# Patient Record
Sex: Female | Born: 1955 | Race: White | Hispanic: No | Smoking: Former smoker
Health system: Southern US, Community
[De-identification: ages and names within clinical notes are randomized; demographics above are authoritative.]

## PROBLEM LIST (undated history)

## (undated) DIAGNOSIS — Z9889 Other specified postprocedural states: Secondary | ICD-10-CM

## (undated) DIAGNOSIS — F419 Anxiety disorder, unspecified: Secondary | ICD-10-CM

## (undated) DIAGNOSIS — I10 Essential (primary) hypertension: Secondary | ICD-10-CM

## (undated) DIAGNOSIS — IMO0001 Reserved for inherently not codable concepts without codable children: Secondary | ICD-10-CM

## (undated) DIAGNOSIS — R112 Nausea with vomiting, unspecified: Secondary | ICD-10-CM

## (undated) DIAGNOSIS — K219 Gastro-esophageal reflux disease without esophagitis: Secondary | ICD-10-CM

## (undated) DIAGNOSIS — I4891 Unspecified atrial fibrillation: Secondary | ICD-10-CM

## (undated) DIAGNOSIS — I499 Cardiac arrhythmia, unspecified: Secondary | ICD-10-CM

## (undated) DIAGNOSIS — E119 Type 2 diabetes mellitus without complications: Secondary | ICD-10-CM

## (undated) HISTORY — PX: BACK SURGERY: SHX140

## (undated) HISTORY — PX: FOOT SURGERY: SHX648

## (undated) HISTORY — PX: KNEE SURGERY: SHX244

## (undated) HISTORY — PX: ABDOMINAL HYSTERECTOMY: SHX81

## (undated) HISTORY — PX: KNEE ARTHROSCOPY: SUR90

## (undated) HISTORY — PX: TUBAL LIGATION: SHX77

## (undated) HISTORY — PX: CHOLECYSTECTOMY: SHX55

---

## 2000-08-24 ENCOUNTER — Encounter: Admission: RE | Admit: 2000-08-24 | Discharge: 2000-08-24 | Payer: Self-pay | Admitting: Neurological Surgery

## 2000-08-24 ENCOUNTER — Encounter: Payer: Self-pay | Admitting: Neurological Surgery

## 2001-05-05 ENCOUNTER — Other Ambulatory Visit: Admission: RE | Admit: 2001-05-05 | Discharge: 2001-05-05 | Payer: Self-pay | Admitting: Obstetrics and Gynecology

## 2002-11-07 ENCOUNTER — Encounter: Payer: Self-pay | Admitting: Neurological Surgery

## 2002-11-07 ENCOUNTER — Ambulatory Visit (HOSPITAL_COMMUNITY): Admission: RE | Admit: 2002-11-07 | Discharge: 2002-11-07 | Payer: Self-pay | Admitting: Hematology and Oncology

## 2003-09-22 ENCOUNTER — Inpatient Hospital Stay (HOSPITAL_COMMUNITY): Admission: EM | Admit: 2003-09-22 | Discharge: 2003-09-23 | Payer: Self-pay | Admitting: Obstetrics and Gynecology

## 2004-12-09 ENCOUNTER — Ambulatory Visit (HOSPITAL_COMMUNITY): Admission: RE | Admit: 2004-12-09 | Discharge: 2004-12-09 | Payer: Self-pay | Admitting: Neurological Surgery

## 2004-12-11 ENCOUNTER — Ambulatory Visit (HOSPITAL_COMMUNITY): Admission: RE | Admit: 2004-12-11 | Discharge: 2004-12-11 | Payer: Self-pay | Admitting: Neurological Surgery

## 2005-01-11 ENCOUNTER — Inpatient Hospital Stay (HOSPITAL_COMMUNITY): Admission: RE | Admit: 2005-01-11 | Discharge: 2005-01-13 | Payer: Self-pay | Admitting: Obstetrics and Gynecology

## 2005-05-13 ENCOUNTER — Inpatient Hospital Stay (HOSPITAL_COMMUNITY): Admission: RE | Admit: 2005-05-13 | Discharge: 2005-05-20 | Payer: Self-pay | Admitting: Neurological Surgery

## 2005-10-05 ENCOUNTER — Encounter: Admission: RE | Admit: 2005-10-05 | Discharge: 2005-11-18 | Payer: Self-pay | Admitting: Neurological Surgery

## 2006-09-02 ENCOUNTER — Ambulatory Visit (HOSPITAL_COMMUNITY): Admission: RE | Admit: 2006-09-02 | Discharge: 2006-09-02 | Payer: Self-pay | Admitting: Neurological Surgery

## 2007-01-31 ENCOUNTER — Ambulatory Visit (HOSPITAL_COMMUNITY): Admission: RE | Admit: 2007-01-31 | Discharge: 2007-01-31 | Payer: Self-pay | Admitting: Neurological Surgery

## 2008-12-17 ENCOUNTER — Ambulatory Visit: Payer: Self-pay | Admitting: Cardiology

## 2010-12-25 NOTE — H&P (Signed)
NAMENANDANA, Amy Livingston            ACCOUNT NO.:  [id number]   MEDICAL RECORD NO.:  [id number]          PATIENT TYPE:  AMB   LOCATION:  DAY                           FACILITY:  [facility]   PHYSICIAN:  Tilda Burrow, M.D. DATE OF BIRTH:  1956/07/09   DATE OF ADMISSION:  DATE OF DISCHARGE:  LH                                HISTORY & PHYSICAL   ADMISSION DIAGNOSES:  1.  Menorrhagia.  2.  Anemia.  3.  Type 2 diabetes mellitus.  4.  Admitted for failed endometrial ablation February 2005.   HISTORY OF PRESENT ILLNESS:  This is a 55 year old female obese diabetic  under excellent overall control who was treated with endometrial ablation  February 2005 but unfortunately failed the Gynecare ThermaChoice III  endometrial ablation attempts. She has been having significant amount of  pain associated with periods lasting four days. The last period was  described as as bad as having a baby. She describes the pain as worse than  the back pain for which she is scheduled for surgery as soon as she is  recovered from the hysterectomy. Plans are to proceed with total abdominal  hysterectomy. The patient wishes for decision regarding ovarian preservation  to be made depending on the visual appearance of the ovaries. She does not  wish to have the ovaries removed unless distinct abnormalities are  identified.   Additionally, the patient wishes to have a panniculectomy. She has a very  lax, thin, lower abdominal panniculus. She has chronic yeast infections  beneath it and has been treating herself with ketoconazole 50% topically on  a continuous basis. She has visual yeast present that seems to be responding  and is controlled at the present time.   PAST MEDICAL HISTORY:  Positive for diabetes mellitus currently on Humalog  70/30, taking 20 units q.a.m. and 2 units q.p.m. Additional medical history  is chronic back pain and obesity.   PAST SURGICAL HISTORY:  1.  Laparoscopic cholecystectomy.  2.   Tubal ligation 1992.  3.  Back surgery x4.  4.  Knee surgery x1.  5.  Foot surgery x2.   SOCIAL HISTORY:  Married, stable relationship. Husband currently has liver  cancer with metastatic disease.   MEDICATIONS:  Glucophage, Xanax, Vicodin p.r.n. back pain.   PHYSICAL EXAMINATION:  VITAL SIGNS:  Blood pressure 136/76, respirations 20,  temperature afebrile, height 5 feet 6, weight 256.  HEENT:  Pupils are equal, round, and reactive. Extraocular movements intact.  CARDIOVASCULAR:  Unremarkable.  BREASTS:  Without masses.  ABDOMEN:  Lax abdominal wall with normal external genitalia. Physiological  vaginal secretions. Cervix normal. Uterus anterior, upper limits normal  size. Adnexa normal.  EXTREMITIES:  Peripheral pulses appropriate with no clubbing, cyanosis, or  edema.   PLAN:  Total abdominal hysterectomy. Panniculectomy on January 11, 2005.      JVF/MEDQ  D:  01/08/2005  T:  01/08/2005  Job:  [id number]

## 2010-12-25 NOTE — Discharge Summary (Signed)
NAME:  Amy Livingston, Amy Livingston                      ACCOUNT NO.:  [id number]   MEDICAL RECORD NO.:  [id number]                   PATIENT TYPE:  INP   LOCATION:  A411                                 FACILITY:  [facility]   PHYSICIAN:  Tilda Burrow, M.D.              DATE OF BIRTH:  06-19-1956   DATE OF ADMISSION:  09/22/2003  DATE OF DISCHARGE:  09/23/2003                                 DISCHARGE SUMMARY   ADMITTING DIAGNOSES:  1. Menorrhagia.  2. Anemia.  3. Lightheadedness secondary to anemia.  4. Noninsulin dependent diabetes mellitus.  5. External hemorrhoid.   DISCHARGE DIAGNOSES:  1. Menorrhagia.  2. Anemia.  3. Lightheadedness secondary to anemia.  4. Noninsulin dependent diabetes mellitus.  5. External hemorrhoid.   PROCEDURE:  Transfusion of two units of packed cells, September 23, 2003.   PROCEDURE:  Hysteroscopy, D&C and endometrial ablation with incision and  drainage of a thrombosed hemorrhoid.   DISCHARGE MEDICATIONS:  1. Toradol 10 mg p.o. q.6h. x1 day.  2. Motrin 800 mg q.8h p.r.n. cramping.   HOSPITAL SUMMARY:  This large-framed 55 year old female status post tubal  ligation years ago, with documentation of hemoglobins as low as 6.9 in the  office, with noncompliance with followup care, was admitted through the  emergency room after presenting complaining of heavy periods and anemia.  She was found to have a normal oxygen saturation, and to be significantly  overweight at 260 down from 300.  She as admitted with hemoglobin that was  considered abnormal.  She underwent transfusion of two units of packed  cells, and after being assessed in the morning, was considered stable for  hysteroscopy, D&C and endometrial ablation which was performed in the  standard fashion with the additional finding at the time of surgery of  thrombosed hemorrhoid which was incised and drained at the time on September 23, 2003.   DISCHARGE INSTRUCTIONS:  Discharge instructions were  given.  The patient is  scheduled to follow up in our office in one week, and then p.r.n.     ___________________________________________                                         Tilda Burrow, M.D.   JVF/MEDQ  D:  11/03/2003  T:  11/04/2003  Job:  [id number]

## 2010-12-25 NOTE — Discharge Summary (Signed)
[name], [name]            ACCOUNT NO.:  [id number]   MEDICAL RECORD NO.:  [id number]          PATIENT TYPE:  INP   LOCATION:  3019                         FACILITY:  [facility]   PHYSICIAN:  Stefani Dama, M.D.  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   FINAL DIAGNOSES:  1.  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  2.  Diabetes mellitus.  3.  Urinary tract infection.  4.  Urinary retention.  5.  Morbid obesity.   CONDITION ON DISCHARGE:  Stable.   HOSPITAL COURSE:  Ms. Amy Livingston is a 55 year old individual who has had  significant problems with back pain. She underwent decompression arthrodesis  L4-L5 back in 1992 and after that surgery and a prolonged recovery she  seemed to do fairly well. She had difficulty with morbid obesity over the  years and this has not improved significantly. The patient has developed  increasing back pain, bilateral leg pain and has spondylitic disease at the  level above her previous arthrodesis. She was advised regarding surgical  decompression and stabilization which was undertaken on May 13, 2005.  Postoperatively, the patient was maintained initially at some bedrest. She  was gradually mobilized on the second postoperative day; however, after a  Foley catheter was removed, the patient experienced urinary retention and  the catheter was replaced for an additional three days period of time.  During this time, she developed urinary tract infection. She also developed  a reaction to some antibiotics which included an overgrowth of yeast. This  was treated with Diflucan. She also developed oral thrush. This was also  treated with swish and  swallow Magic mouthwash. The patient gradually  improved and despite some difficulties with IV insertion sites, the patient  is discharged home at the current time using Percocet 10/325 1 tablet every  4 hours as needed for pain. She is given a prescription of #60 of these. She  is also given Flexeril 10 milligrams 325 at 10 milligrams #30 to be used up  to three times a day for muscle spasm. She will be seen in the office in  three weeks' time. Condition on discharge is stable.      Stefani Dama, M.D.  Electronically Signed     HJE/MEDQ  D:  05/20/2005  T:  05/20/2005  Job:  [id number]

## 2010-12-25 NOTE — Op Note (Signed)
Amy Livingston, Amy Livingston            ACCOUNT NO.:  [id number]   MEDICAL RECORD NO.:  [id number]          PATIENT TYPE:  INP   LOCATION:  A427                          FACILITY:  [facility]   PHYSICIAN:  Tilda Burrow, M.D. DATE OF BIRTH:  [birth year]   DATE OF PROCEDURE:  DATE OF DISCHARGE:                                 OPERATIVE REPORT   PREOPERATIVE DIAGNOSES:  1.  Menorrhagia.  2.  Pain.  3.  Resolved anemia.  4.  Failed endometrial ablation.  5.  Obesity with panniculus and chronic irritation.   POSTOPERATIVE DIAGNOSES:  1.  Menorrhagia.  2.  Pain.  3.  Resolved anemia.  4.  Failed endometrial ablation.  5.  Obesity with panniculus and chronic irritation.   OPERATION/PROCEDURE:  1.  Total abdominal hysterectomy.  2.  Left salpingo-oophorectomy.  3.  Panniculectomy.  4.  Excision of suprapubic skin lesion.   SURGEON:  Tilda Burrow, M.D.   ASSISTANT:  Morrie Sheldon, R.N.   ANESTHESIA:  General.   COMPLICATIONS:  None.   ESTIMATED BLOOD LOSS:  400 mL.   FINDINGS:  1.  Moderate scar tissue around the left tube and ovary from her old tubal      cautery sterilization.  2.  Extremely elongated and dense lower uterine segment which probably      resulted in the dysmenorrhea.  3.  Acceptably normal right ovary left in place.  4.  Huge panniculus.   DESCRIPTION OF PROCEDURE:  The patient was taken to the operating room,  prepped and draped for abdominal surgery with legs in the supine position.  Attention was first directed to the markings which had been placed in the  preoperative area for panniculectomy.  We removed an 80 cm long, 25 cm wide  ellipse of skin and fatty tissue, going from posterior superior iliac crest  on one side to the other side, going approximately 3 cm below the umbilicus  and with the upper incision and the lower incision just above the suprapubic  crease.  This was dissected down to two-thirds the depth of the fat pad,  being careful to leave a  small amount of fatty tissue over the fascia.  Point cautery was used as necessary and two or three midline bleeders  required individual ligation, particularly when arterial supply was  identified.  This was performed and took approximately an hour and 15  minutes when considering excision and closure.  The lateral aspects of the  incisions were immediately irrigated and inspected for hemostasis and closed  in a two-layer subcutaneous closure with interrupted 2-0 plain sutures and  then staple closure for the skin.  The middle portion of it was left open  and then we proceeded with the hysterectomy.   A midline incision was made from the umbilicus to the suprapubic area and  peritoneal cavity entered.  Bowel was packed away. There was rather thick  omentum and the bowel could be packed away.  Attention was then directed to  the pelvis.  The uterus was anteflexed, estimated 250 g weight.  The fundus  was grasped, the round ligaments were  taken down with suture ligation x2  and transection between sutures and bladder flap developed.  The tubes  showed evidence of prior tubal cautery and both tubes had been extensively  destroyed at the time of the prior cautery.  The left tube and ovary  remnants were adherent to the cornual area of the uterus and once we began  to isolate the utero-ovarian ligament, we began to have venous bleeding from  the left side and it was necessary to sacrifice the left ovary by isolating  the infundibulopelvic ligament on that side, clamping, cutting and suture  ligating it.  The ureter was well out of the surgical area.  The right side  allowed salvage of the ovary as per patient's request.  There was no visible  abnormalities other than some scar tissue remnants from the prior cautery as  mentioned, though less severe on the right.   We marched down the broad ligament in small bites, clamping, cutting and  suture ligating.  The lower uterine segment was  extremely long and required  serial bites with straight Heaney clamps, knife dissection and 0 chromic  suture ligature.  The uterine fundus could be amputated off the lower  uterine segment and then we continued marching down through the upper and  lower cardinal ligaments, clamping, cutting and suture ligating on each  side.  A stab incision in the anterior cervical vaginal fornix eventually  was able to be achieved and the cervix amputated off the cuff which was then  closed Aldridge stitches at each lateral vaginal angle and a series of  interrupted 0 chromic sutures to close the vaginal cuff in a transverse  fashion. Inspection of the pelvis showed adequate hemostasis.  Irrigation  was performed.  Inspection again confirmed hemostasis and then we considered  the procedure completed.  After irrigation we removed laparotomy tapes, did  not attempt to reestablish peritoneum over the pelvic floor as it was not  considered feasible.  Then we proceeded with closure of the anterior  peritoneum with running 2-0 chromic, closure of the midline incision with  continuous Maxon (PDS) suture followed by closure of the remainder of the  panniculectomy.  The panniculectomy was rather snug just to the left of the  midline and required some undermining of the remaining fatty tissue, both  above and below the incision to allow proper closure of the incision.  The  patient tolerated the procedure well.  Had Jackson-Pratt drains placed  beneath the panniculectomy excision on either side of the midline.  The  irrigation fluid had Ancef within it for irrigation and the patient was had  skin closure with staples after two layers subcutaneous 2-0 plain closure of  the fatty tissues.  Sponge and needle counts were correct.  Estimated blood  loss 400 mL.   A lesion on the right side of the mons pubis, which appeared to represent an  accessory nipple tissue, was excised and staple closure of this skin  defect performed as well.  The patient tolerated the procedure well and went to the  recovery room in good condition.  Sponge and needle counts correct.       JVF/MEDQ  D:  01/12/2005  T:  01/13/2005  Job:  [id number]

## 2010-12-25 NOTE — H&P (Signed)
NAME:  Amy Livingston, Amy Livingston                      ACCOUNT NO.:  [id number]   MEDICAL RECORD NO.:  [id number]                   PATIENT TYPE:  EMS   LOCATION:  ED                                   FACILITY:  [facility]   PHYSICIAN:  Tilda Burrow, M.D.              DATE OF BIRTH:  01-Aug-1956   DATE OF ADMISSION:  09/22/2003  DATE OF DISCHARGE:                                HISTORY & PHYSICAL   ADMITTING DIAGNOSIS:  1. Menorrhagia.  2. Anemia.  3. Lightheadedness secondary to anemia.  4. Non-insulin-dependent diabetes mellitus (type 2).   HISTORY OF PRESENT ILLNESS:  This 55 year old female is status post tubal  ligation many years ago; being followed through our office for menorrhagia.  She was seen in our office 1 month ago with a hemoglobin of 6.9 on  fingerstick.  She had been evaluated over the past year with pelvic  ultrasound.  Records will be obtained from the office to confirm details.  There was no suspected endometrial pathology.  She was treated 1 month ago  with Lupron for menstrual suppression.  Unfortunately the patient did not  respond to the Lupron with the expected diminishing menses, and the patient  did not follow up at the office until today.  She calls complaining of a  particularly heavy menstrual flow over the weekend with passage of clots and  cramping associated with the clots.  She has been experiencing  lightheadedness over the past month. She has been quite fatigued and her  activity level has been compromised by the anemia and diminished activity  tolerance.  She was admitted for transfusions with probable progression to a  hysterectomy.   PAST MEDICAL HISTORY:  Positive for chronic back pain, type 2 diabetes,  obesity.   SURGICAL HISTORY:  Laparoscopic cholecystectomy, tubal ligation in 1992,  back surgery x4, knee surgery x1, foot surgery x2.   SOCIAL HISTORY:  Married, stable relationship.  Drug use:  Denies alcohol or  recreational drugs.  Smoker--2  packs per day.   MEDICATIONS:  1. Glucophage.  2. Xanax.  3. Vicodin for back pain.   PHYSICAL EXAMINATION:  VITAL SIGNS:  Temperature 99.4, blood pressure  132/74, heart rate 125 and regular, full.  Respirations 20.  Pulse oximetry  100% on room air.  GENERAL:  Shows an obese, Caucasian female who appears her stated age.  Weight 260 down from a peak of 300.  CHEST:  Clear.  ABDOMEN:  Nontender, obese, without masses.  PELVIC:  External genitalia deferred at this time, but recent exam was  normal for patient's age; will obtain.  EXTREMITIES:  Pale.  Peripheral pulses strong.   PLAN:  Admit.  Transfuse 2 units of packed cells, consideration to  proceeding to hysterectomy.   ADDENDUM:  The patient may be a candidate for panniculectomy to improve  access to pelvic structures given her thick abdominal girth and technical  aspects of surgery.  ___________________________________________                                         Tilda Burrow, M.D.   JVF/MEDQ  D:  09/22/2003  T:  09/22/2003  Job:  [id number]   cc:   Tilda Burrow, M.D.  [address] [place]  [place] [address]  Fax: [phone]   [address]  [place]  [place] [address]  Fax: [phone]

## 2010-12-25 NOTE — Op Note (Signed)
Amy Livingston, WECKER            ACCOUNT NO.:  [id number]   MEDICAL RECORD NO.:  [id number]          PATIENT TYPE:  INP   LOCATION:  2861                         FACILITY:  [facility]   PHYSICIAN:  Stefani Dama, M.D.  DATE OF BIRTH:  10-22-1955   DATE OF PROCEDURE:  05/13/2005  DATE OF DISCHARGE:                                 OPERATIVE REPORT   PREOPERATIVE DIAGNOSIS:  Spondylosis, L3-L4, with stenosis, status post  arthrodesis, L4-L5, in 1992.   POSTOPERATIVE DIAGNOSIS:  Spondylosis, L3-L4, with stenosis, status post  arthrodesis, L4-L5, in 1992.   PROCEDURES:  1.  Removal of hardware L4-L5.  2.  Laminotomy, diskectomy, L3-L4, with posterior lumbar interbody      arthrodesis using PEEK bone spacer.  3.  Pedicle screw fixation L3-L4.  4.  Posterolateral arthrodesis with allograft, autograft and Infuse bone      morphogenic protein.   SURGEON:  Stefani Dama, M.D.   FIRST ASSISTANT:  Clydene Fake, M.D.   SECOND ASSISTANT:  Wonda Horner, R.N.   INDICATIONS:  Infantof Villagomez is a 55 year old individual who has had  significant back and lower extremity pain.  She underwent decompression and arthrodesis at the L4-L5 level back i 1992.  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  This condition continued to worsen until now she has a  high-grade stenosis, and she was advised regarding surgical decompression at  the L3-4 level.   PROCEDURE:  The patient was brought to the operating room supine on the  stretcher.  After the smooth induction of general endotracheal anesthesia,  she was turned prone and the back was prepped with DuraPrep, draped in  sterile fashion.  An elliptical incision was made around the previous scar,  and this was excised.  Dissection was carried down to the lumbar dorsal  fascia, which was opened on either side of midline, and by  first dissecting  down on the right side the previously-placed hardware was identified.  This was stainless steel [employer]roMed hardware, which would be incompatible with  any of the titanium hardware that is now currently being used, and for this  reason this hardware was loosened and removed with each of the pedicle  screws and the plate being removed.  The arthrodesis at L4-5 level was quite  solid.  L3-4 was noted to be severely spondylitic.  Incision was carried up  above the L3-4 area to expose fully the spinous process of L3.  The  dissection was then carried down to the laminar arches of L3.  A complete  laminectomy and facetectomy was performed, removing the inferior margin of  the lamina including both facets and their inferior articulating processes  from L3.  This allowed for exploration of the common dural tube, and the  disk space was also explored.  The disk space was noted be bulging  posteriorly quite severely.  The disk space was collapsed.  The medial  aspects of the superior articular processes were cut back so  as to expose  the lateral aspect of the common dural tube and decompressed the takeoff of  the L4 nerve roots.  With this being performed, the dissection was then  continued to allow for bilateral diskectomy at the L3-4 level.  This was  undertaken with a series of Kerrison rongeurs, and ultimately the entirety  of the disk space was evacuated of a substantial quantity of severely  degenerated disk material.  The end plates were then curetted smooth with a  combination of ring curettes, angled curettes and straight curettes, and  ultimately a raspatory was used to smooth the end plates of the vertebrae.  Then the inner interbody space was sized for a 10 mm spacer.  Pedicle entry  sites were then chosen at the L3 pedicle screws after uncovering the  transverse process and the external landmarks.  Pedicle probes were placed  into the L3 vertebral body.  These were noted  to require some re-angulation  to the medial until a more medial trajectory, and this was done without too  much difficulty.  Ultimately at the L3 vertebrae, the pedicles were  instrumented with 6.5 x 40 mm pedicle screws, 7.5 40 mm pedicle screws were  placed at L4.  The interbody space was then filled with a combination of  autograft from the patient's own bone from the facets that had been broken  up to form bone graft in addition to Alpha Grain bone substitute that was  mixed with some of the patient's own bone marrow aspirate that was obtained  through the L3 pedicle holes, in addition to Infuse, which was placed in a  singular layer at the very ventral aspect of the disk space.  Then a  combination of the bone along with the PEEK bone spacers was placed into  each posterior interbody space, first on the right, then on the left.  With  the alignment being checked to be secure, the pedicle screws were connected  together and placed in slight compression with 50 mm precontoured rods.  Final localizing radiograph identified good position of all hardware.  Residual of the bone and the Infuse was placed into the lateral gutters on  the left and to the right of the pedicle screws to allow for posterolateral  arthrodesis.  Once the bone was packed securely, hemostasis was checked in  the epidural space.  Care was taken to make sure none of the bone substance  was in the common dural tube or along the nerve roots.  The common dural  tube was then covered with some light coating of Gelfoam-soaked thrombin.  The lumbar dorsal fascia was then reapproximated after removing some  nonviable muscular tissue from the period of retraction.  The lumbar dorsal  fascia was closed with #1 Vicryl in an interrupted fashion, 2-0 Vicryl was  used on the subcutaneous tissues, and 3-0 Vicryl was used subcuticularly.  Dermabond was placed on the skin and blood loss was estimated 450 mL.  The patient tolerated  procedure well, was returned to the recovery room in  stable condition.      Stefani Dama, M.D.  Electronically Signed     HJE/MEDQ  D:  05/13/2005  T:  05/13/2005  Job:  [id number]

## 2010-12-25 NOTE — Op Note (Signed)
NAME:  Amy Livingston, Amy Livingston                      ACCOUNT NO.:  [id number]   MEDICAL RECORD NO.:  [id number]                   PATIENT TYPE:  INP   LOCATION:  A411                                 FACILITY:  [facility]   PHYSICIAN:  Tilda Burrow, M.D.              DATE OF BIRTH:  May 14, 1956   DATE OF PROCEDURE:  DATE OF DISCHARGE:                                 OPERATIVE REPORT   PREOPERATIVE DIAGNOSES:  Menorrhagia, anemia, type 2 diabetes mellitus,  external hemorrhoids, thrombosed.   POSTOPERATIVE DIAGNOSES:  Menorrhagia, anemia, type 2 diabetes mellitus,  external hemorrhoids, thrombosed.   PROCEDURE:  Hysteroscopy dilation and curettage, endometrial ablation,  incision and drainage thrombosed hemorrhoid.   SURGEON:  Tilda Burrow, M.D.   ASSISTANTKarna Dupes, C.S.T.   ANESTHESIA:  General.   COMPLICATIONS:  None.   ESTIMATED BLOOD LOSS:  50 mL   FINDINGS:  The uterus sounds to 11 cm, smooth, shaggy uterine cavity.  No  obvious polyps or fibroids.   DESCRIPTION OF PROCEDURE:  The patient was taken to the operating room,  prepped and draped for a vaginal procedure.  The cervix was grasped with a  single tooth tenaculum, placed on traction and the uterus sounded to 11 cm,  dilated to 29 [place] and then hysteroscopy.  Hysteroscope introduced to  reveal an endometrial cavity with some old clots and shaggy tissue posterior  without polyps or fibroids or cavity deformity.  Smooth sharp curettage was  performed in all quadrants in order to assess the tissue and rule out any  irregularities. The internal contour felt smooth.  Repeat hysteroscopy was  performed and it once again confirmed a relatively normal smooth endometrial  cavity.  There was some residual clots that were very small and it was  considered appropriate to proceed at this time with endometrial ablation.   Endometrial ablation was performed by prepping the [employer]ne[employer] ThermaChoice III  endometrial ablation unit and  after standard prep free ablation prepping,  the balloon was inserted in the uterine cavity, dilated with 16 mL of D-5-W,  with heating to 87 degrees centigrade, followed by an eight minute  endometrial thermal ablation.  Saline was removed, and procedure considered  successfully completed.   External hemorrhoid was then addressed by identifying the posterior oriented  hemorrhoid at 7 o'clock around the anus, with incision and drainage over a  1.5 cm thrombosis with removal of the thrombosis. The bed of the  hemorrhoid was cauterized briefly with point cautery as necessary to  continue hemostasis.  The area was left open without suturing.  Tissue  approximation was good and hemostasis was excellent. The patient went to  recovery in good condition.      ___________________________________________  Tilda Burrow, M.D.   JVF/MEDQ  D:  09/23/2003  T:  09/23/2003  Job:  [id number]

## 2010-12-25 NOTE — Discharge Summary (Signed)
Amy Livingston, Amy Livingston            ACCOUNT NO.:  [id number]   MEDICAL RECORD NO.:  [id number]          PATIENT TYPE:  INP   LOCATION:  A427                          FACILITY:  [facility]   PHYSICIAN:  Tilda Burrow, M.D. DATE OF BIRTH:  October 10, 1955   DATE OF ADMISSION:  01/11/2005  DATE OF DISCHARGE:  06/07/2006LH                                 DISCHARGE SUMMARY   ADMISSION DIAGNOSES:  1.  Menorrhagia.  2.  Dysmenorrhea.  3.  Anemia.  4.  Type 2 diabetes mellitus.  5.  Failed endometrial ablation, February, 2005.   DISCHARGE DIAGNOSES:  1.  menorrhagia.  2.  Dysmenorrhea.  3.  Anemia.  4.  Type 2 diabetes mellitus.  5.  Failed endometrial ablation, February, 2005.  6.  Obesity with panniculus.   PROCEDURE:  On January 11, 2005 total abdominal hysterectomy with left salpingo-  oophorectomy, panniculectomy, and excision of suprapubic skin [place].   DISCHARGE MEDICATIONS:  1.  Lortab 10 #40 tablets, 1-2 q.4h p.r.n. pain, refill x2.  2.  Levaquin 500 mg #10 tablets 1 p.o. daily.  3.  Motrin 800 mg q.8h for pain, dispense #30.  4.  Diflucan 150 mg 2 tablets 1 q. week x2 weeks.  5.  NovoLog 70/30 12 units q.a.m., 12 units q.p.m. to be increased by 4      units per injection as needed until pre-surgery levels of 20 units      q.a.m. and q.p.m. is reached.  6.  Ambien 10 mg p.o. q.h.s. #15 tablets refill x2.   FOLLOW UP:  January 18, 2005 for wound check.   HOSPITAL SUMMARY:  This 55 year old underwent total abdominal hysterectomy  with left salpingo-oophorectomy, panniculectomy and excision of a skin  nevus. See HPI for details. She was admitted with a hemoglobin of 12.1,  hematocrit 36. Hemoglobin A1C of 7.9. She underwent hysterectomy and  panniculectomy as described in the admitting history and operative notes.  Medical history is significant for chronic back pain due to back discomfort.  This was addressed during the surgical procedure by placing her in position  before the  surgery.   HOSPITAL COURSE:  Was straight forward. She had a 400 cc blood loss at  surgery. Postoperative hemoglobin 10.8, hematocrit 31.6. She remained  afebrile. Sliding scale Insulin requirements were quite low during the  recovery time and she was discharged in 2 days tolerating a regular diet.  For follow up in 5 days.       JVF/MEDQ  D:  01/13/2005  T:  01/13/2005  Job:  [id number]   cc:   [facility]   [address]  [place]  [place] [address]  Fax: [phone]

## 2011-06-14 ENCOUNTER — Other Ambulatory Visit (HOSPITAL_COMMUNITY): Payer: Self-pay | Admitting: Neurological Surgery

## 2011-06-14 DIAGNOSIS — M47816 Spondylosis without myelopathy or radiculopathy, lumbar region: Secondary | ICD-10-CM

## 2011-06-16 ENCOUNTER — Other Ambulatory Visit: Payer: Self-pay | Admitting: Neurological Surgery

## 2011-06-25 ENCOUNTER — Other Ambulatory Visit (HOSPITAL_COMMUNITY): Payer: Self-pay

## 2011-07-16 ENCOUNTER — Ambulatory Visit (HOSPITAL_COMMUNITY)
Admission: RE | Admit: 2011-07-16 | Discharge: 2011-07-16 | Disposition: A | Discharge status: Home / Self Care | Payer: Medicaid Other | Source: Ambulatory Visit | Attending: Neurological Surgery | Admitting: Neurological Surgery

## 2011-07-16 ENCOUNTER — Other Ambulatory Visit: Payer: Self-pay | Admitting: Neurological Surgery

## 2011-07-16 DIAGNOSIS — Z01812 Encounter for preprocedural laboratory examination: Secondary | ICD-10-CM | POA: Insufficient documentation

## 2011-07-16 DIAGNOSIS — M47816 Spondylosis without myelopathy or radiculopathy, lumbar region: Secondary | ICD-10-CM

## 2011-07-16 DIAGNOSIS — E119 Type 2 diabetes mellitus without complications: Secondary | ICD-10-CM | POA: Insufficient documentation

## 2011-07-16 DIAGNOSIS — M545 Low back pain, unspecified: Secondary | ICD-10-CM | POA: Insufficient documentation

## 2011-07-16 DIAGNOSIS — M51379 Other intervertebral disc degeneration, lumbosacral region without mention of lumbar back pain or lower extremity pain: Secondary | ICD-10-CM | POA: Insufficient documentation

## 2011-07-16 DIAGNOSIS — R209 Unspecified disturbances of skin sensation: Secondary | ICD-10-CM | POA: Insufficient documentation

## 2011-07-16 DIAGNOSIS — M79609 Pain in unspecified limb: Secondary | ICD-10-CM | POA: Insufficient documentation

## 2011-07-16 DIAGNOSIS — M48061 Spinal stenosis, lumbar region without neurogenic claudication: Secondary | ICD-10-CM | POA: Insufficient documentation

## 2011-07-16 DIAGNOSIS — I10 Essential (primary) hypertension: Secondary | ICD-10-CM | POA: Insufficient documentation

## 2011-07-16 DIAGNOSIS — M5137 Other intervertebral disc degeneration, lumbosacral region: Secondary | ICD-10-CM | POA: Insufficient documentation

## 2011-07-16 DIAGNOSIS — Z79899 Other long term (current) drug therapy: Secondary | ICD-10-CM | POA: Insufficient documentation

## 2011-07-16 MED ORDER — IOHEXOL 180 MG/ML  SOLN
20.0000 mL | Freq: Once | INTRAMUSCULAR | Status: AC | PRN
  Administered 2011-07-16: 20 mL via INTRATHECAL

## 2011-07-16 MED ORDER — HYDROCODONE-ACETAMINOPHEN 5-325 MG PO TABS
1.0000 | ORAL_TABLET | ORAL | Status: DC | PRN
  Administered 2011-07-16: 2 via ORAL

## 2011-07-16 MED ORDER — HYDROCODONE-ACETAMINOPHEN 5-325 MG PO TABS
ORAL_TABLET | ORAL | Status: AC
  Filled 2011-07-16: qty 2

## 2011-07-16 MED ORDER — ONDANSETRON HCL 4 MG/2ML IJ SOLN: 4.0000 mg | Freq: Four times a day (QID) | INTRAMUSCULAR | Status: DC | PRN
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
## 2011-07-16 NOTE — Procedures (Signed)
Lumbar puncture: l2-3 Fluid: Clear colorless Contrast: Iohexol 180 10 cc Findings: fusion from L3 to sacrum. No severe Stenosis noted.

## 2014-01-17 ENCOUNTER — Encounter (HOSPITAL_COMMUNITY): Payer: Self-pay | Admitting: Emergency Medicine

## 2014-01-17 ENCOUNTER — Emergency Department (HOSPITAL_COMMUNITY)
Admission: EM | Admit: 2014-01-17 | Discharge: 2014-01-18 | Disposition: A | Discharge status: Home / Self Care | Payer: Medicaid Other | Attending: Emergency Medicine | Admitting: Emergency Medicine

## 2014-01-17 DIAGNOSIS — R000 Tachycardia, unspecified: Secondary | ICD-10-CM | POA: Insufficient documentation

## 2014-01-17 DIAGNOSIS — R0789 Other chest pain: Secondary | ICD-10-CM | POA: Insufficient documentation

## 2014-01-17 DIAGNOSIS — R079 Chest pain, unspecified: Secondary | ICD-10-CM

## 2014-01-17 DIAGNOSIS — Z79899 Other long term (current) drug therapy: Secondary | ICD-10-CM | POA: Insufficient documentation

## 2014-01-17 DIAGNOSIS — E7800 Pure hypercholesterolemia, unspecified: Secondary | ICD-10-CM | POA: Insufficient documentation

## 2014-01-17 DIAGNOSIS — I10 Essential (primary) hypertension: Secondary | ICD-10-CM | POA: Insufficient documentation

## 2014-01-17 DIAGNOSIS — E119 Type 2 diabetes mellitus without complications: Secondary | ICD-10-CM | POA: Insufficient documentation

## 2014-01-17 HISTORY — DX: Type 2 diabetes mellitus without complications: E11.9

## 2014-01-17 NOTE — ED Notes (Signed)
Pt states arm pain and fatigue started a couple days ago, today new onset chest pain/heaviness, palpitations in the last few hours.

## 2014-01-18 LAB — CBC WITH DIFFERENTIAL/PLATELET
BASOS ABS: 0 10*3/uL (ref 0.0–0.1)
BASOS PCT: 0 % (ref 0–1)
Eosinophils Absolute: 0.2 10*3/uL (ref 0.0–0.7)
Eosinophils Relative: 3 % (ref 0–5)
HCT: 38.1 % (ref 36.0–46.0)
Hemoglobin: 12.6 g/dL (ref 12.0–15.0)
Lymphocytes Relative: 34 % (ref 12–46)
Lymphs Abs: 2.9 10*3/uL (ref 0.7–4.0)
MCH: 29.2 pg (ref 26.0–34.0)
MCHC: 33.1 g/dL (ref 30.0–36.0)
MCV: 88.2 fL (ref 78.0–100.0)
MONO ABS: 0.7 10*3/uL (ref 0.1–1.0)
Monocytes Relative: 8 % (ref 3–12)
NEUTROS ABS: 4.7 10*3/uL (ref 1.7–7.7)
NEUTROS PCT: 55 % (ref 43–77)
PLATELETS: 260 10*3/uL (ref 150–400)
RBC: 4.32 MIL/uL (ref 3.87–5.11)
RDW: 14.7 % (ref 11.5–15.5)
WBC: 8.5 10*3/uL (ref 4.0–10.5)

## 2014-01-18 LAB — TROPONIN I: Troponin I: <0.3 ng/mL (ref <0.30)

## 2014-01-18 LAB — BASIC METABOLIC PANEL
BUN: 11 mg/dL (ref 6–23)
CHLORIDE: 98 meq/L (ref 96–112)
CO2: 24 mEq/L (ref 19–32)
CREATININE: 0.72 mg/dL (ref 0.50–1.10)
Calcium: 9.5 mg/dL (ref 8.4–10.5)
GFR calc non Af Amer: >90 mL/min (ref >90)
Glucose, Bld: 393 mg/dL — ABNORMAL HIGH (ref 70–99)
POTASSIUM: 3.7 meq/L (ref 3.7–5.3)
Sodium: 139 mEq/L (ref 137–147)

## 2014-01-18 MED ORDER — SODIUM CHLORIDE 0.9 % IV SOLN
Freq: Once | INTRAVENOUS | Status: AC
  Administered 2014-01-18: 1000 mL/h via INTRAVENOUS

## 2014-01-18 NOTE — ED Notes (Signed)
Patient given discharge instruction, verbalized understand. IV removed, band aid applied. Patient ambulatory out of the department.  

## 2014-01-18 NOTE — ED Provider Notes (Signed)
CSN: [id number]     Arrival date & time 01/17/14  2348 History   First MD Initiated Contact with Patient 01/18/14 0015   This chart was scribed for Derwood KaplanAnkit Selso Mannor, MD by Valera CastleSteven Perry, ED Scribe. This patient was seen in room APA09/APA09 and the patient's care was started at 12:35 AM.   Chief Complaint  Patient presents with  . Chest Pain   (Consider location/radiation/quality/duration/timing/severity/associated sxs/prior Treatment) The history is provided by the patient. No language interpreter was used.   HPI Comments: Amy Livingston is a 58 y.o. female who presents to the Emergency Department complaining of mild, intermittent, pressure-like chest pain that radiates to her left shoulder and arm down to her wrist, onset 2 days ago. She denies the pain radiating to her back. She denies anything precipitating factors, exacerbating or reliving her pain including position. Exertion, breathing and palpation. She reports associated tingling in her left arm. Pain lasts for few seconds only, and she has had about 3-4 episdoes thus far. She denies current neck pain. She denies nausea, diaphoresis, and any other associated symptoms. She reports h/o DM, HTN, hypercholesterolemia. She states she last had a stress test 4 years ago [place], normal results as far as pt can remember.  Pt is noted to have elveated HR. She reports having elevated rate at baseline. She denies h/o heart issues, lung issues, DVTs, PE and there is no risk factors for DVT/PE.  PCP - PROVIDER NOT IN SYSTEM  Past Medical History  Diagnosis Date  . Diabetes mellitus without complication    Past Surgical History  Procedure Laterality Date  . Back surgery      x 5  . Abdominal hysterectomy    . Knee arthroscopy      left   History reviewed. No pertinent family history. History  Substance Use Topics  . Smoking status: Never Smoker   . Smokeless tobacco: Not on file  . Alcohol Use: No   OB History   Grav Para Term Preterm  Abortions TAB SAB Ect Mult Living                 Review of Systems  Constitutional: Negative for diaphoresis and activity change.  HENT: Negative for facial swelling.   Respiratory: Negative for cough, shortness of breath and wheezing.   Cardiovascular: Positive for chest pain (central) and palpitations.  Gastrointestinal: Negative for nausea, vomiting, abdominal pain, diarrhea, constipation, blood in stool and abdominal distention.  Genitourinary: Negative for hematuria and difficulty urinating.  Musculoskeletal: Positive for arthralgias (left shoulder down to left wrist). Negative for neck pain.  Skin: Negative for color change.  Neurological: Positive for numbness (left arm). Negative for speech difficulty.  Hematological: Does not bruise/bleed easily.  Psychiatric/Behavioral: Negative for confusion.    Allergies  Review of patient's allergies indicates no known allergies.  Home Medications   Prior to Admission medications   Medication Sig Start Date End Date Taking? Authorizing Provider  ALPRAZolam Prudy Feeler(XANAX) 1 MG tablet Take 1 mg by mouth 4 (four) times daily as needed. For anxiety    Yes Historical Provider, MD  cyclobenzaprine (FLEXERIL) 10 MG tablet Take 10 mg by mouth 3 (three) times daily as needed. For muscle spasms    Yes Historical Provider, MD  fenofibrate (TRICOR) 145 MG tablet Take 145 mg by mouth daily.     Yes Historical Provider, MD  HYDROcodone-acetaminophen (LORTAB) 10-500 MG per tablet Take 1 tablet by mouth every 6 (six) hours as needed. For pain  Yes Historical Provider, MD  metFORMIN (GLUCOPHAGE) 1000 MG tablet Take 1,000 mg by mouth 2 (two) times daily.     Yes Historical Provider, MD  omeprazole (PRILOSEC) 40 MG capsule Take 40 mg by mouth daily.     Yes Historical Provider, MD  hydrochlorothiazide (HYDRODIURIL) 25 MG tablet Take 25 mg by mouth daily.      Historical Provider, MD  ibuprofen (ADVIL,MOTRIN) 800 MG tablet Take 800 mg by mouth 2 (two) times  daily as needed. For pain     Historical Provider, MD  meclizine (ANTIVERT) 25 MG tablet Take 25 mg by mouth daily as needed. For dizziness     Historical Provider, MD  potassium chloride (KLOR-CON) 10 MEQ CR tablet Take 10 mEq by mouth daily.      Historical Provider, MD  ramipril (ALTACE) 1.25 MG capsule Take 1.25 mg by mouth daily.      Historical Provider, MD  saxagliptin HCl (ONGLYZA) 2.5 MG TABS tablet Take 5 mg by mouth daily.      Historical Provider, MD  simvastatin (ZOCOR) 20 MG tablet Take 20 mg by mouth daily.      Historical Provider, MD   BP 170/86  Pulse 115  Temp(Src) 98.4 F (36.9 C) (Oral)  Resp 16  Ht 5\' 7"  (1.702 m)  Wt 296 lb (134.265 kg)  BMI 46.35 kg/m2  SpO2 98% Physical Exam  Nursing note and vitals reviewed. Constitutional: She is oriented to person, place, and time. She appears well-developed and well-nourished. No distress.  HENT:  Head: Normocephalic and atraumatic.  Eyes: Conjunctivae and EOM are normal.  Neck: Normal range of motion. No tracheal deviation present.  Cardiovascular: Normal rate, normal heart sounds and intact distal pulses.   No murmur heard. Pt's heart rate is elevated. Bilateral equal radial and dorsalis pedis pulses.  Pulmonary/Chest: Effort normal and breath sounds normal. No respiratory distress. She has no wheezes. She has no rales. She exhibits no tenderness.  No depreciable chest wall pain.  Abdominal: Soft.  Musculoskeletal: Normal range of motion.  No pitting edema. No unilateral calf swelling. No calf tenderness.  Neurological: She is alert and oriented to person, place, and time.  Skin: Skin is warm and dry.  Psychiatric: She has a normal mood and affect. Her behavior is normal.    ED Course  Procedures (including critical care time)  DIAGNOSTIC STUDIES: Oxygen Saturation is 98% on room air, normal by my interpretation.    COORDINATION OF CARE: 12:40 AM-Discussed treatment plan with pt at bedside and pt agreed to  plan.   Medications  0.9 %  sodium chloride infusion ( Intravenous Stopped 01/18/14 0152)    Labs Review Labs Reviewed  BASIC METABOLIC PANEL - Abnormal; Notable for the following:    Glucose, Bld 393 (*)    All other components within normal limits  CBC WITH DIFFERENTIAL  TROPONIN I  TROPONIN I    Imaging Review No results found.   EKG Interpretation   Date/Time:  Friday January 18 2014 00:01:04 EDT Ventricular Rate:  115 PR Interval:  154 QRS Duration: 79 QT Interval:  338 QTC Calculation: 467 R Axis:   45 Text Interpretation:  Sinus tachycardia Normal intervals No acute findings  Confirmed by Rhunette Croft, MD, Ileah Falkenstein [phone]) on 01/18/2014 1:04:42 AM      MDM   Final diagnoses:  Chest pain    I personally performed the services described in this documentation, which was scribed in my presence. The recorded information has been reviewed  and is accurate.  Pt comes in with atypical chest pain. 3-4 brief episodes of atypical pain, with some typical features (tightness, radiation). Not pleuritic, or exertional. No hx of same.   HEART SCORE IS 4 History Highly suspicious  Moderately suspicious 1   Normal ekg 0   Age  58 - 66 years 1   Risk Factors  3 risk factors 2   Troponin  ? normal limit 0  However, she has had only 4 episodes, and they have lasted just a few seconds - which just doesn't appear to be cardiac in nature. Further more, and most importantly, patient is reliable and has a PCP who states she can f/u with in a week time. PCP has ordered stress in the past as well. So i feel comfortable sending her home, with proper return precautions.   Not sure what is the cause of her tachycardia. Sinus on rhythm, no right sided strain. Pt reports that the tachycardia is not new, and that she usually rests in the low 100s for her HR. PCP f/u for chest pain and tachycardia requested.    Derwood KaplanAnkit Davidson Palmieri, MD 01/18/14 [phone]

## 2014-01-18 NOTE — Discharge Instructions (Signed)
We saw you in the ER for the chest pain/shortness of breath. All of our cardiac workup is normal, including labs, EKG and chest X-RAY are normal. We are not sure what is causing your discomfort, but we feel comfortable sending you home at this time. The workup in the ER is not complete, and you should follow up with your primary care doctor for further evaluation.  PLEASE SEE THE PRIMARY CARE DOCTOR FOR FURTHER EVALUATION FOR YOUR PAIN AND ELEVATED HEART RATE. RETURN TO THE ER IF THE SYMPTOMS GET WORSE.   Aspirin and Your Heart Aspirin affects the way your blood clots and helps "thin" the blood. Aspirin has many uses in heart disease. It may be used as a primary prevention to help reduce the risk of heart related events. It also can be used as a secondary measure to prevent more heart attacks or to prevent additional damage from blood clots.  ASPIRIN MAY HELP IF YOU:  Have had a heart attack or chest pain.  Have undergone open heart surgery such as CABG (Coronary Artery Bypass Surgery).  Have had coronary angioplasty with or without stents.  Have experienced a stroke or TIA (transient ischemic attack).  Have peripheral vascular disease (PAD).  Have chronic heart rhythm problems such as atrial fibrillation.  Are at risk for heart disease. BEFORE STARTING ASPIRIN Before you start taking aspirin, your caregiver will need to review your medical history. Many things will need to be taken into consideration, such as:  Smoking status.  Blood pressure.  Diabetes.  Gender.  Weight.  Cholesterol level. ASPIRIN DOSES  Aspirin should only be taken on the advice of your caregiver. Talk to your caregiver about how much aspirin you should take. Aspirin comes in different doses such as:  81 mg.  162 mg.  325 mg.  The aspirin dose you take may be affected by many factors, some of which include:  Your current medications, especially if your are taking blood-thinners or anti-platelet  medicine.  Liver function.  Heart disease risk.  Age.  Aspirin comes in two forms:  Non-enteric-coated. This type of aspirin does not have a coating and is absorbed faster. Non-enteric coated aspirin is recommended for patients experiencing chest pain symptoms. This type of aspirin also comes in a chewable form.  Enteric-coated. This means the aspirin has a special coating that releases the medicine very slowly. Enteric-coated aspirin causes less stomach upset. This type of aspirin should not be chewed or crushed. ASPIRIN SIDE EFFECTS Daily use of aspirin can increase your risk of serious side effects. Some of these include:  Increased bleeding. This can range from a cut that does not stop bleeding to more serious problems such as stomach bleeding or bleeding into the brain (Intracerebral bleeding).  Increased bruising.  Stomach upset.  An allergic reaction such as red, itchy skin.  Increased risk of bleeding when combined with non-steroidal anti-inflammatory medicine (NSAIDS).  Alcohol should be drank in moderation when taking aspirin. Alcohol can increase the risk of stomach bleeding when taken with aspirin.  Aspirin should not be given to children less than 57 years of age due to the association of Reye syndrome. Reye syndrome is a serious illness that can affect the brain and liver. Studies have linked Reye syndrome with aspirin use in children.  People that have nasal polyps have an increased risk of developing an aspirin allergy. SEEK MEDICAL CARE IF:   You develop an allergic reaction such as:  Hives.  Itchy skin.  Swelling  of the lips, tongue or face.  You develop stomach pain.  You have unusual bleeding or bruising.  You have ringing in your ears. SEEK IMMEDIATE MEDICAL CARE IF:   You have severe chest pain, especially if the pain is crushing or pressure-like and spreads to the arms, back, neck, or jaw. THIS IS AN EMERGENCY. Do not wait to see if the pain will  go away. Get medical help at once. Call your local emergency services (911 in the [place] DO NOT drive yourself to the hospital.  You have stroke-like symptoms such as:  Loss of vision.  Difficulty talking.  Numbness or weakness on one side of your body.  Numbness or weakness in your arm or leg.  Not thinking clearly or feeling confused.  Your bowel movements are bloody, dark red or black in color.  You vomit or cough up blood.  You have blood in your urine.  You have shortness of breath, coughing or wheezing. MAKE SURE YOU:   Understand these instructions.  Will monitor your condition.  Seek immediate medical care if necessary. Document Released: 07/08/2008 Document Revised: 11/20/2012 Document Reviewed: 07/08/2008 [facility] Patient Information 2014 [place], [place].  Chest Pain (Nonspecific) It is often hard to give a specific diagnosis for the cause of chest pain. There is always a chance that your pain could be related to something serious, such as a heart attack or a blood clot in the lungs. You need to follow up with your caregiver for further evaluation. CAUSES   Heartburn.  Pneumonia or bronchitis.  Anxiety or stress.  Inflammation around your heart (pericarditis) or lung (pleuritis or pleurisy).  A blood clot in the lung.  A collapsed lung (pneumothorax). It can develop suddenly on its own (spontaneous pneumothorax) or from injury (trauma) to the chest.  Shingles infection (herpes zoster virus). The chest wall is composed of bones, muscles, and cartilage. Any of these can be the source of the pain.  The bones can be bruised by injury.  The muscles or cartilage can be strained by coughing or overwork.  The cartilage can be affected by inflammation and become sore (costochondritis). DIAGNOSIS  Lab tests or other studies, such as X-rays, electrocardiography, stress testing, or cardiac imaging, may be needed to find the cause of your pain.  TREATMENT    Treatment depends on what may be causing your chest pain. Treatment may include:  Acid blockers for heartburn.  Anti-inflammatory medicine.  Pain medicine for inflammatory conditions.  Antibiotics if an infection is present.  You may be advised to change lifestyle habits. This includes stopping smoking and avoiding alcohol, caffeine, and chocolate.  You may be advised to keep your head raised (elevated) when sleeping. This reduces the chance of acid going backward from your stomach into your esophagus.  Most of the time, nonspecific chest pain will improve within 2 to 3 days with rest and mild pain medicine. HOME CARE INSTRUCTIONS   If antibiotics were prescribed, take your antibiotics as directed. Finish them even if you start to feel better.  For the next few days, avoid physical activities that bring on chest pain. Continue physical activities as directed.  Do not smoke.  Avoid drinking alcohol.  Only take over-the-counter or prescription medicine for pain, discomfort, or fever as directed by your caregiver.  Follow your caregiver's suggestions for further testing if your chest pain does not go away.  Keep any follow-up appointments you made. If you do not go to an appointment, you could develop lasting (  chronic) problems with pain. If there is any problem keeping an appointment, you must call to reschedule. SEEK MEDICAL CARE IF:   You think you are having problems from the medicine you are taking. Read your medicine instructions carefully.  Your chest pain does not go away, even after treatment.  You develop a rash with blisters on your chest. SEEK IMMEDIATE MEDICAL CARE IF:   You have increased chest pain or pain that spreads to your arm, neck, jaw, back, or abdomen.  You develop shortness of breath, an increasing cough, or you are coughing up blood.  You have severe back or abdominal pain, feel nauseous, or vomit.  You develop severe weakness, fainting, or  chills.  You have a fever. THIS IS AN EMERGENCY. Do not wait to see if the pain will go away. Get medical help at once. Call your local emergency services (911 in [place] Do not drive yourself to the hospital. MAKE SURE YOU:   Understand these instructions.  Will watch your condition.  Will get help right away if you are not doing well or get worse. Document Released: 05/05/2005 Document Revised: 10/18/2011 Document Reviewed: 02/29/2008 [facility] Patient Information 2014 [place]ExitCare, [place]LL[place].

## 2014-01-18 NOTE — ED Notes (Signed)
Pt is Bactrim x 2 weeks for UTI

## 2014-05-20 ENCOUNTER — Encounter (HOSPITAL_COMMUNITY): Payer: Self-pay | Admitting: Emergency Medicine

## 2014-05-20 ENCOUNTER — Emergency Department (HOSPITAL_COMMUNITY)
Admission: EM | Admit: 2014-05-20 | Discharge: 2014-05-20 | Disposition: A | Discharge status: Home / Self Care | Payer: Medicaid Other | Attending: Emergency Medicine | Admitting: Emergency Medicine

## 2014-05-20 DIAGNOSIS — S61012A Laceration without foreign body of left thumb without damage to nail, initial encounter: Secondary | ICD-10-CM | POA: Diagnosis not present

## 2014-05-20 DIAGNOSIS — Z23 Encounter for immunization: Secondary | ICD-10-CM | POA: Insufficient documentation

## 2014-05-20 DIAGNOSIS — E119 Type 2 diabetes mellitus without complications: Secondary | ICD-10-CM | POA: Insufficient documentation

## 2014-05-20 DIAGNOSIS — Y9389 Activity, other specified: Secondary | ICD-10-CM | POA: Insufficient documentation

## 2014-05-20 DIAGNOSIS — S61219A Laceration without foreign body of unspecified finger without damage to nail, initial encounter: Secondary | ICD-10-CM

## 2014-05-20 DIAGNOSIS — Y9289 Other specified places as the place of occurrence of the external cause: Secondary | ICD-10-CM | POA: Insufficient documentation

## 2014-05-20 DIAGNOSIS — W452XXA Lid of can entering through skin, initial encounter: Secondary | ICD-10-CM | POA: Diagnosis not present

## 2014-05-20 DIAGNOSIS — Z9889 Other specified postprocedural states: Secondary | ICD-10-CM | POA: Insufficient documentation

## 2014-05-20 MED ORDER — TETANUS-DIPHTH-ACELL PERTUSSIS 5-2.5-18.5 LF-MCG/0.5 IM SUSP
0.5000 mL | Freq: Once | INTRAMUSCULAR | Status: AC
  Administered 2014-05-20: 0.5 mL via INTRAMUSCULAR

## 2014-05-20 MED ORDER — TETANUS-DIPHTH-ACELL PERTUSSIS 5-2.5-18.5 LF-MCG/0.5 IM SUSP
INTRAMUSCULAR | Status: AC
  Filled 2014-05-20: qty 0.5

## 2014-05-20 NOTE — ED Notes (Signed)
Patient cut left thumb on top of a can tonight. No bleeding at this time.

## 2014-05-20 NOTE — ED Provider Notes (Signed)
CSN: [id number]     Arrival date & time 05/20/14  2005 History   First MD Initiated Contact with Patient 05/20/14 2138     Chief Complaint  Patient presents with  . Extremity Laceration     (Consider location/radiation/quality/duration/timing/severity/associated sxs/prior Treatment) HPI   Amy Livingston is a 58 y.o. female who presents to the Emergency Department complaining of laceration to the tip of her left thumb that occurred while trying to open a metal can.  She reports bleeding to her finger that has stopped since arrival.  She has applied pressure to the wound.  Nothing makes the symptoms worse.  She denies numbness or weakness of the finger or inability to bend the finger.  She is unsure of her last tetanus injection     Past Medical History  Diagnosis Date  . Diabetes mellitus without complication    Past Surgical History  Procedure Laterality Date  . Back surgery      x 5  . Abdominal hysterectomy    . Knee arthroscopy      left   History reviewed. No pertinent family history. History  Substance Use Topics  . Smoking status: Never Smoker   . Smokeless tobacco: Not on file  . Alcohol Use: No   OB History   Grav Para Term Preterm Abortions TAB SAB Ect Mult Living                 Review of Systems  Constitutional: Negative for fever and chills.  Musculoskeletal: Negative for arthralgias, back pain and joint swelling.  Skin: Positive for wound.       Laceration finger  Neurological: Negative for dizziness, weakness and numbness.  Hematological: Does not bruise/bleed easily.  All other systems reviewed and are negative.     Allergies  Review of patient's allergies indicates no known allergies.  Home Medications   Prior to Admission medications   Medication Sig Start Date End Date Taking? Authorizing Provider  ALPRAZolam Prudy Feeler(XANAX) 1 MG tablet Take 1 mg by mouth 4 (four) times daily as needed. For anxiety     Historical Provider, MD  cyclobenzaprine  (FLEXERIL) 10 MG tablet Take 10 mg by mouth 3 (three) times daily as needed. For muscle spasms     Historical Provider, MD  fenofibrate (TRICOR) 145 MG tablet Take 145 mg by mouth daily.      Historical Provider, MD  hydrochlorothiazide (HYDRODIURIL) 25 MG tablet Take 25 mg by mouth daily.      Historical Provider, MD  HYDROcodone-acetaminophen (LORTAB) 10-500 MG per tablet Take 1 tablet by mouth every 6 (six) hours as needed. For pain     Historical Provider, MD  ibuprofen (ADVIL,MOTRIN) 800 MG tablet Take 800 mg by mouth 2 (two) times daily as needed. For pain     Historical Provider, MD  meclizine (ANTIVERT) 25 MG tablet Take 25 mg by mouth daily as needed. For dizziness     Historical Provider, MD  metFORMIN (GLUCOPHAGE) 1000 MG tablet Take 1,000 mg by mouth 2 (two) times daily.      Historical Provider, MD  omeprazole (PRILOSEC) 40 MG capsule Take 40 mg by mouth daily.      Historical Provider, MD  potassium chloride (KLOR-CON) 10 MEQ CR tablet Take 10 mEq by mouth daily.      Historical Provider, MD  ramipril (ALTACE) 1.25 MG capsule Take 1.25 mg by mouth daily.      Historical Provider, MD  saxagliptin HCl (ONGLYZA) 2.5 MG TABS  tablet Take 5 mg by mouth daily.      Historical Provider, MD  simvastatin (ZOCOR) 20 MG tablet Take 20 mg by mouth daily.      Historical Provider, MD   BP 118/80  Pulse 117  Temp(Src) 98.2 F (36.8 C) (Oral)  Resp 20  Ht 5\' 6"  (1.676 m)  Wt 285 lb (129.275 kg)  BMI 46.02 kg/m2  SpO2 95% Physical Exam  Nursing note and vitals reviewed. Constitutional: She is oriented to person, place, and time. She appears well-developed and well-nourished. No distress.  HENT:  Head: Normocephalic and atraumatic.  Cardiovascular: Normal rate, regular rhythm, normal heart sounds and intact distal pulses.   No murmur heard. Pulmonary/Chest: Effort normal and breath sounds normal. No respiratory distress.  Musculoskeletal: She exhibits no edema and no tenderness.   Neurological: She is alert and oriented to person, place, and time. She exhibits normal muscle tone. Coordination normal.  Skin: Skin is warm. Laceration noted.  Laceration to the distal tip of the left thumb.  Bleeding controlled.  No edema.  Distal sensation intact, pt has full ROM of the thumb.    ED Course  Procedures (including critical care time) Labs Review Labs Reviewed - No data to display  Imaging Review No results found.   EKG Interpretation None       LACERATION REPAIR Performed by: Jhoan Schmieder L. Authorized by: Maxwell CaulRIPLETT,Jolane Bankhead L. Consent: Verbal consent obtained. Risks and benefits: risks, benefits and alternatives were discussed Consent given by: patient Patient identity confirmed: provided demographic data Prepped and Draped in normal sterile fashion Wound explored  Laceration Location: left distal thumb Laceration Length: 1 cm  No Foreign Bodies seen or palpated  Anesthesia:none   Irrigation method: syringe Amount of cleaning: standard  Skin closure: sterile tape   Technique:topical application  Patient tolerance: Patient tolerated the procedure well with no immediate complications.  MDM   Final diagnoses:  Laceration of finger, initial encounter    TdaP updated, wound bandaged.  Pt agrees to return here if needed.  Remains NV intact.      Danae Oland L. Kadajah Kjos, PA-C 05/20/14 2248

## 2014-05-20 NOTE — ED Notes (Signed)
Flap lac to distal phalanx of lt thumb, cut on can lid No active bleeding

## 2014-05-20 NOTE — Discharge Instructions (Signed)
Sterile Tape Wound Care Some cuts and wounds can be closed using sterile tape, also called skin adhesive strips. Skin adhesive strips can be used for shallow (superficial) and simple cuts, wounds, lacerations, and surgical incisions. These strips act in place of stitches to hold the edges of the wound together, allowing for faster healing. Unlike stitches, the adhesive strips do not require needles or anesthetic medicine for placement. The strips will wear off naturally as the wound is healing. It is important to take proper care of your wound at home while it heals.  HOME CARE INSTRUCTIONS  Try to keep the area around your wound clean and dry. Do not allow the adhesive strips to get wet for the first 12 hours.   Do not use any soaps or ointments on the wound for the first 12 hours.   If a bandage (dressing) has been applied, follow your health care provider's instructions for how often to change the dressing. Keep the dressing dry if one has been applied.   Do not remove the adhesive strips. They will fall off on their own. If they do not, you may remove them gently after 10 days. You should gently wet the strips before removing them. For example, this can be done in the shower.  Do not scratch, pick, or rub the wound area.   Protect the wound from further injury until it is healed.   Protect the wound from sun and tanning bed exposure while it is healing and for several weeks after healing.   Only take over-the-counter or prescription medicines as directed by your health care provider.   Keep all follow-up appointments as directed by your health care provider.  SEEK MEDICAL CARE IF: Your adhesive strips become wet or soaked with blood before the wound has healed. The tape will need to be replaced.  SEEK IMMEDIATE MEDICAL CARE IF:  You have increasing pain in the wound.   You develop a rash after the strips are applied.  Your wound becomes red, swollen, hot, or tender.   You  have a red streak that goes away from the wound.   You have pus coming from the wound.   You have increased bleeding from the wound.  You notice a bad smell coming from the wound.   Your wound breaks open. MAKE SURE YOU:  Understand these instructions.  Will watch your condition.  Will get help right away if you are not doing well or get worse. Document Released: 09/02/2004 Document Revised: 05/16/2013 Document Reviewed: 02/14/2013 ExitCare Patient Information 2015 Exit[employer], [employer]. This information is not intended to replace advice given to you by your health care provider. Make sure you discuss any questions you have with your health care provider.  

## 2014-05-21 NOTE — ED Provider Notes (Signed)
Medical screening examination/treatment/procedure(s) were performed by non-physician practitioner and as supervising physician I was immediately available for consultation/collaboration.   EKG Interpretation None       Donnetta HutchingBrian Kobi Mario, MD 05/21/14 1607

## 2015-12-04 DIAGNOSIS — M5000 Cervical disc disorder with myelopathy, unspecified cervical region: Secondary | ICD-10-CM | POA: Insufficient documentation

## 2015-12-04 DIAGNOSIS — M542 Cervicalgia: Secondary | ICD-10-CM | POA: Insufficient documentation

## 2015-12-10 ENCOUNTER — Other Ambulatory Visit (HOSPITAL_COMMUNITY): Payer: Self-pay | Admitting: Neurological Surgery

## 2015-12-10 DIAGNOSIS — M5000 Cervical disc disorder with myelopathy, unspecified cervical region: Secondary | ICD-10-CM

## 2015-12-16 ENCOUNTER — Ambulatory Visit (HOSPITAL_COMMUNITY)
Admission: RE | Admit: 2015-12-16 | Discharge: 2015-12-16 | Disposition: A | Discharge status: Home / Self Care | Payer: Medicaid Other | Source: Ambulatory Visit | Attending: Neurological Surgery | Admitting: Neurological Surgery

## 2015-12-16 DIAGNOSIS — M4712 Other spondylosis with myelopathy, cervical region: Secondary | ICD-10-CM | POA: Diagnosis not present

## 2015-12-16 DIAGNOSIS — M5000 Cervical disc disorder with myelopathy, unspecified cervical region: Secondary | ICD-10-CM | POA: Insufficient documentation

## 2015-12-26 ENCOUNTER — Other Ambulatory Visit: Payer: Self-pay | Admitting: Neurological Surgery

## 2015-12-31 ENCOUNTER — Other Ambulatory Visit: Payer: Self-pay | Admitting: Neurological Surgery

## 2016-01-02 NOTE — Pre-Procedure Instructions (Signed)
Amy Livingston  01/02/2016      [facility] [place], [place] - [address] [place] [address] Phone: [phone] Fax: [phone]    Your procedure is scheduled on 01/13/16.  Report to [facility] Admitting at 1045 A.M.  Call this number if you have problems the morning of surgery:  [phone]   Remember:  Do not eat food or drink liquids after midnight.  Take these medicines the morning of surgery with A SIP OF WATER --xanax,hydrocodone,prilosec   Do not wear jewelry, make-up or nail polish.  Do not wear lotions, powders, or perfumes.  You may wear deodorant.  Do not shave 48 hours prior to surgery.  Men may shave face and neck.  Do not bring valuables to the hospital.  [facility] is not responsible for any belongings or valuables.  Contacts, dentures or bridgework may not be worn into surgery.  Leave your suitcase in the car.  After surgery it may be brought to your room.  For patients admitted to the hospital, discharge time will be determined by your treatment team.  Patients discharged the day of surgery will not be allowed to drive home.   Name and phone number of your driver:   Special instructions:    Please read over the following fact sheets that you were given. MRSA Information   How to Manage Your Diabetes Before and After Surgery  Why is it important to control my blood sugar before and after surgery? . Improving blood sugar levels before and after surgery helps healing and can limit problems. . A way of improving blood sugar control is eating a healthy diet by: o  Eating less sugar and carbohydrates o  Increasing activity/exercise o  Talking with your doctor about reaching your blood sugar goals . High blood sugars (greater than 180 mg/dL) can raise your risk of infections and slow your recovery, so you will need to focus on controlling your diabetes during the weeks before surgery. . Make  sure that the doctor who takes care of your diabetes knows about your planned surgery including the date and location.  How do I manage my blood sugar before surgery? . Check your blood sugar at least 4 times a day, starting 2 days before surgery, to make sure that the level is not too high or low. o Check your blood sugar the morning of your surgery when you wake up and every 2 hours until you get to the Short Stay unit. . If your blood sugar is less than 70 mg/dL, you will need to treat for low blood sugar: o Do not take insulin. o Treat a low blood sugar (less than 70 mg/dL) with  cup of clear juice (cranberry or apple), 4 glucose tablets, OR glucose gel. o Recheck blood sugar in 15 minutes after treatment (to make sure it is greater than 70 mg/dL). If your blood sugar is not greater than 70 mg/dL on recheck, call [phone] for further instructions. . Report your blood sugar to the short stay nurse when you get to Short Stay.  . If you are admitted to the hospital after surgery: o Your blood sugar will be checked by the staff and you will probably be given insulin after surgery (instead of oral diabetes medicines) to make sure you have good blood sugar levels. o The goal for blood sugar control after surgery is 80-180 mg/dL.  WHAT DO I DO ABOUT MY DIABETES MEDICATION?   Marland Kitchen. Do not take oral diabetes medicines (pills) the morning of surgery.  . THE NIGHT BEFORE SURGERY, take __25_________ units of ___levemir________insulin.       Marland Kitchen. HE MORNING OF SURGERY, take ________25_____ units of _____levemir_____insulin.  . The day of surgery, do not take other diabetes injectables, including Byetta (exenatide), Bydureon (exenatide ER), Victoza (liraglutide), or Trulicity (dulaglutide).  . If your CBG is greater than 220 mg/dL, you may take  of your sliding scale (correction) dose of insulin.  Other Instructions:          Patient Signature:  Date:   Nurse  Signature:  Date:   Reviewed and Endorsed by [facility] Patient Education Committee, August 2015

## 2016-01-06 ENCOUNTER — Encounter (HOSPITAL_COMMUNITY): Payer: Self-pay

## 2016-01-06 ENCOUNTER — Encounter (HOSPITAL_COMMUNITY)
Admission: RE | Admit: 2016-01-06 | Discharge: 2016-01-06 | Disposition: A | Discharge status: Home / Self Care | Payer: Medicaid Other | Source: Ambulatory Visit | Attending: Neurological Surgery | Admitting: Neurological Surgery

## 2016-01-06 DIAGNOSIS — Z01818 Encounter for other preprocedural examination: Secondary | ICD-10-CM | POA: Diagnosis not present

## 2016-01-06 DIAGNOSIS — R9431 Abnormal electrocardiogram [ECG] [EKG]: Secondary | ICD-10-CM | POA: Insufficient documentation

## 2016-01-06 DIAGNOSIS — I10 Essential (primary) hypertension: Secondary | ICD-10-CM | POA: Diagnosis not present

## 2016-01-06 DIAGNOSIS — M5000 Cervical disc disorder with myelopathy, unspecified cervical region: Secondary | ICD-10-CM | POA: Insufficient documentation

## 2016-01-06 DIAGNOSIS — Z01812 Encounter for preprocedural laboratory examination: Secondary | ICD-10-CM | POA: Insufficient documentation

## 2016-01-06 HISTORY — DX: Gastro-esophageal reflux disease without esophagitis: K21.9

## 2016-01-06 HISTORY — DX: Reserved for inherently not codable concepts without codable children: IMO0001

## 2016-01-06 HISTORY — DX: Anxiety disorder, unspecified: F41.9

## 2016-01-06 HISTORY — DX: Nausea with vomiting, unspecified: Z98.890

## 2016-01-06 HISTORY — DX: Essential (primary) hypertension: I10

## 2016-01-06 HISTORY — DX: Nausea with vomiting, unspecified: R11.2

## 2016-01-06 LAB — CBC
HEMATOCRIT: 39.9 % (ref 36.0–46.0)
HEMOGLOBIN: 12.5 g/dL (ref 12.0–15.0)
MCH: 28.2 pg (ref 26.0–34.0)
MCHC: 31.3 g/dL (ref 30.0–36.0)
MCV: 90.1 fL (ref 78.0–100.0)
Platelets: 286 10*3/uL (ref 150–400)
RBC: 4.43 MIL/uL (ref 3.87–5.11)
RDW: 14.4 % (ref 11.5–15.5)
WBC: 10.2 10*3/uL (ref 4.0–10.5)

## 2016-01-06 LAB — BASIC METABOLIC PANEL
Anion gap: 10 (ref 5–15)
BUN: 6 mg/dL (ref 6–20)
CALCIUM: 9.2 mg/dL (ref 8.9–10.3)
CHLORIDE: 105 mmol/L (ref 101–111)
CO2: 23 mmol/L (ref 22–32)
CREATININE: 0.75 mg/dL (ref 0.44–1.00)
GFR calc Af Amer: >60 mL/min (ref >60)
GFR calc non Af Amer: >60 mL/min (ref >60)
GLUCOSE: 282 mg/dL — AB (ref 65–99)
Potassium: 4.1 mmol/L (ref 3.5–5.1)
Sodium: 138 mmol/L (ref 135–145)

## 2016-01-06 LAB — SURGICAL PCR SCREEN
MRSA, PCR: NEGATIVE
Staphylococcus aureus: POSITIVE — AB

## 2016-01-06 LAB — GLUCOSE, CAPILLARY: Glucose-Capillary: 230 mg/dL — ABNORMAL HIGH (ref 65–99)

## 2016-01-07 LAB — HEMOGLOBIN A1C
HEMOGLOBIN A1C: 10.1 % — AB (ref 4.8–5.6)
Mean Plasma Glucose: 243 mg/dL

## 2016-01-08 NOTE — Progress Notes (Signed)
Anesthesia Chart Review:  Pt is a 60 year old female scheduled for C5-6 ACDF on 01/13/2016 with Dr. Danielle DessElsner.   PMH includes:  HTN, DM, post-op N/V, GERD. Never smoker. BMI 46  Medications include: fenofibrate, hctz, levemir, humalog, linagliptin, metformin, prilosec, simvastatin  Preoperative labs reviewed.  HgbA1c 10.1, glucose 282  EKG 01/06/16: Sinus tachycardia (104 bpm). Nonspecific ST abnormality  Spoke with pt by phone. Fasting sugars usually range 110-170. I explained to pt surgery could be cancelled if she arrives DOS was a blood glucose over 200 and advised pt to be careful with her diet and medications until surgery.   If glucose acceptable DOS, I anticipate pt can proceed as scheduled.   Rica Mastngela Darrow Barreiro, FNP-BC [facility]hesiology Phone: [phone] 01/08/2016 10:44 AM

## 2016-01-12 MED ORDER — CEFAZOLIN SODIUM-DEXTROSE 2-4 GM/100ML-% IV SOLN: 2.0000 g | INTRAVENOUS | Status: DC

## 2016-01-12 MED ORDER — DEXTROSE 5 % IV SOLN
3.0000 g | INTRAVENOUS | Status: AC
  Administered 2016-01-13: 3 g via INTRAVENOUS
  Filled 2016-01-12: qty 3000

## 2016-01-13 ENCOUNTER — Ambulatory Visit (HOSPITAL_COMMUNITY): Payer: Medicaid Other | Admitting: Emergency Medicine

## 2016-01-13 ENCOUNTER — Observation Stay (HOSPITAL_COMMUNITY)
Admission: RE | Admit: 2016-01-13 | Discharge: 2016-01-14 | Disposition: A | Discharge status: Home / Self Care | Payer: Medicaid Other | Source: Ambulatory Visit | Attending: Neurological Surgery | Admitting: Neurological Surgery

## 2016-01-13 ENCOUNTER — Encounter (HOSPITAL_COMMUNITY)
Admission: RE | Disposition: A | Discharge status: Home / Self Care | Payer: Self-pay | Source: Ambulatory Visit | Attending: Neurological Surgery

## 2016-01-13 ENCOUNTER — Ambulatory Visit (HOSPITAL_COMMUNITY): Payer: Medicaid Other

## 2016-01-13 ENCOUNTER — Ambulatory Visit: Admit: 2016-01-13 | Payer: Self-pay | Admitting: Neurological Surgery

## 2016-01-13 DIAGNOSIS — Z6841 Body Mass Index (BMI) 40.0 and over, adult: Secondary | ICD-10-CM | POA: Diagnosis not present

## 2016-01-13 DIAGNOSIS — R531 Weakness: Secondary | ICD-10-CM | POA: Diagnosis not present

## 2016-01-13 DIAGNOSIS — Z794 Long term (current) use of insulin: Secondary | ICD-10-CM | POA: Diagnosis not present

## 2016-01-13 DIAGNOSIS — M5412 Radiculopathy, cervical region: Secondary | ICD-10-CM | POA: Diagnosis not present

## 2016-01-13 DIAGNOSIS — F419 Anxiety disorder, unspecified: Secondary | ICD-10-CM | POA: Diagnosis not present

## 2016-01-13 DIAGNOSIS — I10 Essential (primary) hypertension: Secondary | ICD-10-CM | POA: Insufficient documentation

## 2016-01-13 DIAGNOSIS — E119 Type 2 diabetes mellitus without complications: Secondary | ICD-10-CM | POA: Insufficient documentation

## 2016-01-13 DIAGNOSIS — M4712 Other spondylosis with myelopathy, cervical region: Secondary | ICD-10-CM | POA: Diagnosis not present

## 2016-01-13 DIAGNOSIS — K219 Gastro-esophageal reflux disease without esophagitis: Secondary | ICD-10-CM | POA: Diagnosis not present

## 2016-01-13 DIAGNOSIS — Z419 Encounter for procedure for purposes other than remedying health state, unspecified: Secondary | ICD-10-CM

## 2016-01-13 HISTORY — PX: ANTERIOR CERVICAL DECOMP/DISCECTOMY FUSION: SHX1161

## 2016-01-13 LAB — GLUCOSE, CAPILLARY
GLUCOSE-CAPILLARY: 116 mg/dL — AB (ref 65–99)
GLUCOSE-CAPILLARY: 108 mg/dL — AB (ref 65–99)
GLUCOSE-CAPILLARY: 175 mg/dL — AB (ref 65–99)
Glucose-Capillary: 162 mg/dL — ABNORMAL HIGH (ref 65–99)
Glucose-Capillary: 126 mg/dL — ABNORMAL HIGH (ref 65–99)

## 2016-01-13 SURGERY — CERVICAL ANTERIOR DISC ARTHROPLASTY
Anesthesia: General

## 2016-01-13 SURGERY — ANTERIOR CERVICAL DECOMPRESSION/DISCECTOMY FUSION 1 LEVEL
Anesthesia: General

## 2016-01-13 MED ORDER — MENTHOL 3 MG MT LOZG: 1.0000 | LOZENGE | OROMUCOSAL | Status: DC | PRN

## 2016-01-13 MED ORDER — HEMOSTATIC AGENTS (NO CHARGE) OPTIME
TOPICAL | Status: DC | PRN
  Administered 2016-01-13: 1 via TOPICAL

## 2016-01-13 MED ORDER — HYDROCODONE-ACETAMINOPHEN 5-325 MG PO TABS
ORAL_TABLET | ORAL | Status: AC
  Filled 2016-01-13: qty 1

## 2016-01-13 MED ORDER — CYCLOBENZAPRINE HCL 10 MG PO TABS
ORAL_TABLET | ORAL | Status: AC
  Filled 2016-01-13: qty 1

## 2016-01-13 MED ORDER — METHOCARBAMOL 1000 MG/10ML IJ SOLN
500.0000 mg | Freq: Four times a day (QID) | INTRAVENOUS | Status: DC | PRN
  Filled 2016-01-13: qty 5

## 2016-01-13 MED ORDER — LINAGLIPTIN 5 MG PO TABS
5.0000 mg | ORAL_TABLET | Freq: Every day | ORAL | Status: DC
  Filled 2016-01-13: qty 1

## 2016-01-13 MED ORDER — PROPOFOL 10 MG/ML IV BOLUS
INTRAVENOUS | Status: AC
  Filled 2016-01-13: qty 20

## 2016-01-13 MED ORDER — PHENOL 1.4 % MT LIQD: 1.0000 | OROMUCOSAL | Status: DC | PRN

## 2016-01-13 MED ORDER — ONDANSETRON HCL 4 MG/2ML IJ SOLN
INTRAMUSCULAR | Status: AC
  Filled 2016-01-13: qty 4

## 2016-01-13 MED ORDER — INSULIN ASPART PROT & ASPART (70-30 MIX) 100 UNIT/ML ~~LOC~~ SUSP
60.0000 [IU] | Freq: Two times a day (BID) | SUBCUTANEOUS | Status: DC
  Administered 2016-01-14: 60 [IU] via SUBCUTANEOUS
  Filled 2016-01-13: qty 10

## 2016-01-13 MED ORDER — FENTANYL CITRATE (PF) 100 MCG/2ML IJ SOLN
INTRAMUSCULAR | Status: DC | PRN
  Administered 2016-01-13: 100 ug via INTRAVENOUS
  Administered 2016-01-13: 150 ug via INTRAVENOUS

## 2016-01-13 MED ORDER — SUCCINYLCHOLINE CHLORIDE 20 MG/ML IJ SOLN
INTRAMUSCULAR | Status: DC | PRN
  Administered 2016-01-13: 80 mg via INTRAVENOUS

## 2016-01-13 MED ORDER — METFORMIN HCL 500 MG PO TABS
1000.0000 mg | ORAL_TABLET | Freq: Two times a day (BID) | ORAL | Status: DC
  Administered 2016-01-14: 1000 mg via ORAL
  Filled 2016-01-13: qty 2

## 2016-01-13 MED ORDER — ROCURONIUM BROMIDE 50 MG/5ML IV SOLN
INTRAVENOUS | Status: AC
  Filled 2016-01-13: qty 2

## 2016-01-13 MED ORDER — GABAPENTIN 300 MG PO CAPS
300.0000 mg | ORAL_CAPSULE | Freq: Three times a day (TID) | ORAL | Status: DC
  Administered 2016-01-13: 300 mg via ORAL
  Filled 2016-01-13: qty 1

## 2016-01-13 MED ORDER — PROMETHAZINE HCL 25 MG/ML IJ SOLN: 6.2500 mg | INTRAMUSCULAR | Status: DC | PRN

## 2016-01-13 MED ORDER — ROCURONIUM BROMIDE 100 MG/10ML IV SOLN
INTRAVENOUS | Status: DC | PRN
  Administered 2016-01-13: 30 mg via INTRAVENOUS
  Administered 2016-01-13 (×2): 10 mg via INTRAVENOUS

## 2016-01-13 MED ORDER — ACETAMINOPHEN 650 MG RE SUPP: 650.0000 mg | RECTAL | Status: DC | PRN

## 2016-01-13 MED ORDER — INSULIN DETEMIR 100 UNIT/ML ~~LOC~~ SOLN
50.0000 [IU] | Freq: Every day | SUBCUTANEOUS | Status: DC
  Administered 2016-01-13: 50 [IU] via SUBCUTANEOUS
  Filled 2016-01-13 (×2): qty 0.5

## 2016-01-13 MED ORDER — CYCLOBENZAPRINE HCL 10 MG PO TABS
10.0000 mg | ORAL_TABLET | Freq: Three times a day (TID) | ORAL | Status: DC | PRN
  Administered 2016-01-13 – 2016-01-14 (×2): 10 mg via ORAL
  Filled 2016-01-13: qty 1

## 2016-01-13 MED ORDER — METHOCARBAMOL 500 MG PO TABS: 500.0000 mg | ORAL_TABLET | Freq: Four times a day (QID) | ORAL | Status: DC | PRN

## 2016-01-13 MED ORDER — MEPERIDINE HCL 25 MG/ML IJ SOLN: 6.2500 mg | INTRAMUSCULAR | Status: DC | PRN

## 2016-01-13 MED ORDER — SODIUM CHLORIDE 0.9 % IV SOLN: 250.0000 mL | INTRAVENOUS | Status: DC

## 2016-01-13 MED ORDER — ACETAMINOPHEN 325 MG PO TABS: 650.0000 mg | ORAL_TABLET | ORAL | Status: DC | PRN

## 2016-01-13 MED ORDER — HYDROMORPHONE HCL 1 MG/ML IJ SOLN: 0.2500 mg | INTRAMUSCULAR | Status: DC | PRN

## 2016-01-13 MED ORDER — ALPRAZOLAM 0.5 MG PO TABS: 1.0000 mg | ORAL_TABLET | Freq: Two times a day (BID) | ORAL | Status: DC | PRN

## 2016-01-13 MED ORDER — SUGAMMADEX SODIUM 500 MG/5ML IV SOLN
INTRAVENOUS | Status: AC
  Filled 2016-01-13: qty 5

## 2016-01-13 MED ORDER — ROCURONIUM BROMIDE 50 MG/5ML IV SOLN
INTRAVENOUS | Status: AC
  Filled 2016-01-13: qty 1

## 2016-01-13 MED ORDER — DOCUSATE SODIUM 100 MG PO CAPS: 100.0000 mg | ORAL_CAPSULE | Freq: Two times a day (BID) | ORAL | Status: DC

## 2016-01-13 MED ORDER — BENAZEPRIL HCL 20 MG PO TABS
20.0000 mg | ORAL_TABLET | Freq: Every day | ORAL | Status: DC
  Filled 2016-01-13: qty 1

## 2016-01-13 MED ORDER — LIDOCAINE 2% (20 MG/ML) 5 ML SYRINGE
INTRAMUSCULAR | Status: AC
Start: 2016-01-13 — End: 2016-01-13
  Filled 2016-01-13: qty 10

## 2016-01-13 MED ORDER — MIDAZOLAM HCL 2 MG/2ML IJ SOLN
INTRAMUSCULAR | Status: AC
  Filled 2016-01-13: qty 2

## 2016-01-13 MED ORDER — FENTANYL CITRATE (PF) 250 MCG/5ML IJ SOLN
INTRAMUSCULAR | Status: AC
  Filled 2016-01-13: qty 5

## 2016-01-13 MED ORDER — SODIUM CHLORIDE 0.9% FLUSH: 3.0000 mL | INTRAVENOUS | Status: DC | PRN

## 2016-01-13 MED ORDER — PHENYLEPHRINE HCL 10 MG/ML IJ SOLN
10.0000 mg | INTRAVENOUS | Status: DC | PRN
  Administered 2016-01-13 (×2): 50 ug/min via INTRAVENOUS

## 2016-01-13 MED ORDER — FENOFIBRATE 160 MG PO TABS: 160.0000 mg | ORAL_TABLET | Freq: Every day | ORAL | Status: DC

## 2016-01-13 MED ORDER — PROPOFOL 10 MG/ML IV BOLUS
INTRAVENOUS | Status: DC | PRN
  Administered 2016-01-13: 160 mg via INTRAVENOUS
  Administered 2016-01-13: 40 mg via INTRAVENOUS

## 2016-01-13 MED ORDER — MIDAZOLAM HCL 2 MG/2ML IJ SOLN: 0.5000 mg | Freq: Once | INTRAMUSCULAR | Status: DC | PRN

## 2016-01-13 MED ORDER — BUPIVACAINE HCL (PF) 0.5 % IJ SOLN
INTRAMUSCULAR | Status: DC | PRN
  Administered 2016-01-13: 5 mL

## 2016-01-13 MED ORDER — DEXAMETHASONE SODIUM PHOSPHATE 10 MG/ML IJ SOLN
INTRAMUSCULAR | Status: DC | PRN
  Administered 2016-01-13: 10 mg via INTRAVENOUS

## 2016-01-13 MED ORDER — ONDANSETRON HCL 4 MG/2ML IJ SOLN: 4.0000 mg | INTRAMUSCULAR | Status: DC | PRN

## 2016-01-13 MED ORDER — FENTANYL CITRATE (PF) 100 MCG/2ML IJ SOLN
25.0000 ug | INTRAMUSCULAR | Status: DC | PRN
Start: 2016-01-13 — End: 2016-01-13

## 2016-01-13 MED ORDER — LIDOCAINE-EPINEPHRINE 1 %-1:100000 IJ SOLN
INTRAMUSCULAR | Status: DC | PRN
  Administered 2016-01-13: 5 mL via INTRADERMAL

## 2016-01-13 MED ORDER — 0.9 % SODIUM CHLORIDE (POUR BTL) OPTIME
TOPICAL | Status: DC | PRN
  Administered 2016-01-13: 1000 mL

## 2016-01-13 MED ORDER — ATORVASTATIN CALCIUM 20 MG PO TABS: 80.0000 mg | ORAL_TABLET | Freq: Every day | ORAL | Status: DC

## 2016-01-13 MED ORDER — PANTOPRAZOLE SODIUM 40 MG PO TBEC: 40.0000 mg | DELAYED_RELEASE_TABLET | Freq: Every day | ORAL | Status: DC

## 2016-01-13 MED ORDER — LACTATED RINGERS IV SOLN
INTRAVENOUS | Status: DC
  Administered 2016-01-13 (×3): via INTRAVENOUS

## 2016-01-13 MED ORDER — MECLIZINE HCL 12.5 MG PO TABS: 25.0000 mg | ORAL_TABLET | Freq: Every day | ORAL | Status: DC | PRN

## 2016-01-13 MED ORDER — PHENYLEPHRINE HCL 10 MG/ML IJ SOLN
INTRAMUSCULAR | Status: DC | PRN
  Administered 2016-01-13: 80 ug via INTRAVENOUS

## 2016-01-13 MED ORDER — SODIUM CHLORIDE 0.9% FLUSH: 3.0000 mL | Freq: Two times a day (BID) | INTRAVENOUS | Status: DC

## 2016-01-13 MED ORDER — HYDROCHLOROTHIAZIDE 25 MG PO TABS: 25.0000 mg | ORAL_TABLET | Freq: Every day | ORAL | Status: DC

## 2016-01-13 MED ORDER — POLYETHYLENE GLYCOL 3350 17 G PO PACK: 17.0000 g | PACK | Freq: Every day | ORAL | Status: DC | PRN

## 2016-01-13 MED ORDER — INSULIN ASPART PROT & ASPART (70-30 MIX) 100 UNIT/ML ~~LOC~~ SUSP: 40.0000 [IU] | Freq: Every day | SUBCUTANEOUS | Status: DC

## 2016-01-13 MED ORDER — CEFAZOLIN SODIUM 1-5 GM-% IV SOLN
1.0000 g | Freq: Three times a day (TID) | INTRAVENOUS | Status: DC
  Administered 2016-01-14: 1 g via INTRAVENOUS
  Filled 2016-01-13: qty 50

## 2016-01-13 MED ORDER — MORPHINE SULFATE (PF) 2 MG/ML IV SOLN
1.0000 mg | INTRAVENOUS | Status: DC | PRN
  Administered 2016-01-13 – 2016-01-14 (×2): 4 mg via INTRAVENOUS
  Filled 2016-01-13 (×2): qty 2

## 2016-01-13 MED ORDER — MIDAZOLAM HCL 5 MG/5ML IJ SOLN
INTRAMUSCULAR | Status: DC | PRN
  Administered 2016-01-13: 2 mg via INTRAVENOUS

## 2016-01-13 MED ORDER — ALUM & MAG HYDROXIDE-SIMETH 200-200-20 MG/5ML PO SUSP: 30.0000 mL | Freq: Four times a day (QID) | ORAL | Status: DC | PRN

## 2016-01-13 MED ORDER — THROMBIN 5000 UNITS EX SOLR
CUTANEOUS | Status: DC | PRN
Start: 2016-01-13 — End: 2016-01-13
  Administered 2016-01-13 (×2): 5000 [IU] via TOPICAL

## 2016-01-13 MED ORDER — SODIUM CHLORIDE 0.9 % IR SOLN
Status: DC | PRN
  Administered 2016-01-13: 500 mL

## 2016-01-13 MED ORDER — HYDROCODONE-ACETAMINOPHEN 5-325 MG PO TABS
1.0000 | ORAL_TABLET | ORAL | Status: DC | PRN
  Administered 2016-01-13: 1 via ORAL

## 2016-01-13 MED ORDER — SUGAMMADEX SODIUM 500 MG/5ML IV SOLN
INTRAVENOUS | Status: DC | PRN
  Administered 2016-01-13: 275 mg via INTRAVENOUS

## 2016-01-13 MED ORDER — OXYCODONE-ACETAMINOPHEN 5-325 MG PO TABS
1.0000 | ORAL_TABLET | ORAL | Status: DC | PRN
  Administered 2016-01-14: 2 via ORAL
  Filled 2016-01-13: qty 2

## 2016-01-13 MED ORDER — SUCCINYLCHOLINE CHLORIDE 200 MG/10ML IV SOSY
PREFILLED_SYRINGE | INTRAVENOUS | Status: AC
  Filled 2016-01-13: qty 20

## 2016-01-13 MED ORDER — MAGNESIUM CITRATE PO SOLN: 1.0000 | Freq: Once | ORAL | Status: DC | PRN

## 2016-01-13 MED ORDER — LIDOCAINE HCL (CARDIAC) 20 MG/ML IV SOLN
INTRAVENOUS | Status: DC | PRN
  Administered 2016-01-13: 80 mg via INTRAVENOUS

## 2016-01-13 MED ORDER — BISACODYL 10 MG RE SUPP
10.0000 mg | Freq: Every day | RECTAL | Status: DC | PRN
Start: 2016-01-13 — End: 2016-01-14

## 2016-01-13 MED ORDER — SENNA 8.6 MG PO TABS: 1.0000 | ORAL_TABLET | Freq: Two times a day (BID) | ORAL | Status: DC

## 2016-01-13 MED ORDER — DEXAMETHASONE SODIUM PHOSPHATE 10 MG/ML IJ SOLN
INTRAMUSCULAR | Status: AC
  Filled 2016-01-13: qty 1

## 2016-01-13 MED ORDER — ONDANSETRON HCL 4 MG/2ML IJ SOLN
INTRAMUSCULAR | Status: DC | PRN
  Administered 2016-01-13: 4 mg via INTRAVENOUS

## 2016-01-13 SURGICAL SUPPLY — 53 items
ADH SKN CLS APL DERMABOND .7 (GAUZE/BANDAGES/DRESSINGS) ×1
ALLOGRAFT TRIAD LORDOTIC CC (Bone Implant) ×2 IMPLANT
BAG DECANTER FOR FLEXI CONT (MISCELLANEOUS) ×3 IMPLANT
BIT DRILL NEURO 2X3.1 SFT TUCH (MISCELLANEOUS) ×1 IMPLANT
BIT DRILL POWER (BIT) IMPLANT
BNDG GAUZE ELAST 4 BULKY (GAUZE/BANDAGES/DRESSINGS) IMPLANT
BUR BARREL STRAIGHT FLUTE 4.0 (BURR) ×3 IMPLANT
CANISTER SUCT 3000ML PPV (MISCELLANEOUS) ×3 IMPLANT
DECANTER SPIKE VIAL GLASS SM (MISCELLANEOUS) ×3 IMPLANT
DERMABOND ADVANCED (GAUZE/BANDAGES/DRESSINGS) ×2
DERMABOND ADVANCED .7 DNX12 (GAUZE/BANDAGES/DRESSINGS) ×1 IMPLANT
DRAPE LAPAROTOMY 100X72 PEDS (DRAPES) ×3 IMPLANT
DRAPE MICROSCOPE LEICA (MISCELLANEOUS) IMPLANT
DRAPE POUCH INSTRU U-SHP 10X18 (DRAPES) ×3 IMPLANT
DRILL BIT POWER (BIT) ×3
DRILL NEURO 2X3.1 SOFT TOUCH (MISCELLANEOUS) ×3
DURAPREP 6ML APPLICATOR 50/CS (WOUND CARE) ×3 IMPLANT
ELECT REM PT RETURN 9FT ADLT (ELECTROSURGICAL) ×3
ELECTRODE REM PT RTRN 9FT ADLT (ELECTROSURGICAL) ×1 IMPLANT
GAUZE SPONGE 4X4 16PLY XRAY LF (GAUZE/BANDAGES/DRESSINGS) IMPLANT
GLOVE BIO SURGEON STRL SZ7.5 (GLOVE) IMPLANT
GLOVE BIOGEL PI IND STRL 7.5 (GLOVE) IMPLANT
GLOVE BIOGEL PI IND STRL 8.5 (GLOVE) ×1 IMPLANT
GLOVE BIOGEL PI INDICATOR 7.5 (GLOVE)
GLOVE BIOGEL PI INDICATOR 8.5 (GLOVE) ×2
GLOVE ECLIPSE 8.5 STRL (GLOVE) ×3 IMPLANT
GLOVE EXAM NITRILE LRG STRL (GLOVE) IMPLANT
GLOVE EXAM NITRILE MD LF STRL (GLOVE) IMPLANT
GLOVE EXAM NITRILE XL STR (GLOVE) IMPLANT
GLOVE EXAM NITRILE XS STR PU (GLOVE) IMPLANT
GOWN STRL REUS W/ TWL LRG LVL3 (GOWN DISPOSABLE) IMPLANT
GOWN STRL REUS W/ TWL XL LVL3 (GOWN DISPOSABLE) ×1 IMPLANT
GOWN STRL REUS W/TWL 2XL LVL3 (GOWN DISPOSABLE) ×3 IMPLANT
GOWN STRL REUS W/TWL LRG LVL3 (GOWN DISPOSABLE)
GOWN STRL REUS W/TWL XL LVL3 (GOWN DISPOSABLE) ×3
HALTER HD/CHIN CERV TRACTION D (MISCELLANEOUS) ×3 IMPLANT
KIT BASIN OR (CUSTOM PROCEDURE TRAY) ×3 IMPLANT
KIT ROOM TURNOVER OR (KITS) ×3 IMPLANT
NDL SPNL 22GX3.5 QUINCKE BK (NEEDLE) ×1 IMPLANT
NEEDLE HYPO 22GX1.5 SAFETY (NEEDLE) ×3 IMPLANT
NEEDLE SPNL 22GX3.5 QUINCKE BK (NEEDLE) ×3 IMPLANT
NS IRRIG 1000ML POUR BTL (IV SOLUTION) ×3 IMPLANT
PACK LAMINECTOMY NEURO (CUSTOM PROCEDURE TRAY) ×3 IMPLANT
PAD ARMBOARD 7.5X6 YLW CONV (MISCELLANEOUS) ×9 IMPLANT
PLATE ARCHON 1-LEVEL 22MM (Plate) ×2 IMPLANT
RUBBERBAND STERILE (MISCELLANEOUS) IMPLANT
SCREW ARCHON SELFTAP 4.0X13 (Screw) ×8 IMPLANT
SPONGE INTESTINAL PEANUT (DISPOSABLE) ×3 IMPLANT
SPONGE SURGIFOAM ABS GEL SZ50 (HEMOSTASIS) ×3 IMPLANT
SUT VIC AB 3-0 SH 8-18 (SUTURE) ×3 IMPLANT
TOWEL OR 17X24 6PK STRL BLUE (TOWEL DISPOSABLE) ×3 IMPLANT
TOWEL OR 17X26 10 PK STRL BLUE (TOWEL DISPOSABLE) ×3 IMPLANT
WATER STERILE IRR 1000ML POUR (IV SOLUTION) ×3 IMPLANT

## 2016-01-13 NOTE — H&P (Signed)
Amy ClementDeborah Livingston is an 60 y.o. female.   Chief Complaint: Neck shoulder and left arm pain HPI: Patient is a 60 year old right-handed individual who's had significant neck shoulder and left arm pain. She has evidence of a herniated nucleus pulposus level of C5-C6 with flattening of the spinal cord. She has not done well with conservative management and has been advised regarding surgical decompression. She is now taken to the operating room to undergo anterior decompression arthrodesis at the level of C5-C6.  Past Medical History  Diagnosis Date  . Diabetes mellitus without complication (HCC)   . PONV (postoperative nausea and vomiting)   . Hypertension   . Shortness of breath dyspnea   . Anxiety   . GERD (gastroesophageal reflux disease)     Past Surgical History  Procedure Laterality Date  . Back surgery      x 5  . Abdominal hysterectomy    . Knee arthroscopy      left  . Tubal ligation    . Cholecystectomy      No family history on file. Social History:  reports that she has never smoked. She does not have any smokeless tobacco history on file. She reports that she does not drink alcohol or use illicit drugs.  Allergies:  Allergies  Allergen Reactions  . Sulfur Itching and Rash    Yeast infections    Medications Prior to Admission  Medication Sig Dispense Refill  . ALPRAZolam (XANAX) 1 MG tablet Take 1 mg by mouth 2 (two) times daily as needed for anxiety.     Marland Kitchen. atorvastatin (LIPITOR) 80 MG tablet Take 80 mg by mouth daily.    . benazepril (LOTENSIN) 20 MG tablet Take 20 mg by mouth daily.    . cyclobenzaprine (FLEXERIL) 10 MG tablet Take 10 mg by mouth 3 (three) times daily as needed. For muscle spasms     . fenofibrate (TRICOR) 145 MG tablet Take 145 mg by mouth daily.      Marland Kitchen. gabapentin (NEURONTIN) 300 MG capsule Take 300 mg by mouth 3 (three) times daily.    . hydrochlorothiazide (HYDRODIURIL) 25 MG tablet Take 25 mg by mouth daily.      Marland Kitchen. HYDROcodone-acetaminophen  (LORTAB) 10-500 MG per tablet Take 1 tablet by mouth every 6 (six) hours as needed for pain.     Marland Kitchen. insulin detemir (LEVEMIR) 100 UNIT/ML injection Inject 50 Units into the skin at bedtime.    . insulin lispro protamine-lispro (HUMALOG 50/50 MIX) (50-50) 100 UNIT/ML SUSP injection Inject 40-60 Units into the skin 3 (three) times daily. 60 units in morning 40 units at noon 60 units in the evening    . linagliptin (TRADJENTA) 5 MG TABS tablet Take 5 mg by mouth daily.    . metFORMIN (GLUCOPHAGE) 1000 MG tablet Take 1,000 mg by mouth 2 (two) times daily.      Marland Kitchen. omeprazole (PRILOSEC) 40 MG capsule Take 40 mg by mouth daily.      . simvastatin (ZOCOR) 20 MG tablet Take 20 mg by mouth daily.      Marland Kitchen. VITAMIN B1-B12 IM Inject 1 mg into the muscle every 30 (thirty) days.    Marland Kitchen. ibuprofen (ADVIL,MOTRIN) 800 MG tablet Take 800 mg by mouth 2 (two) times daily as needed for moderate pain.     . meclizine (ANTIVERT) 25 MG tablet Take 25 mg by mouth daily as needed for dizziness.     . potassium chloride (KLOR-CON) 10 MEQ CR tablet Take 10 mEq by mouth  daily.      . ramipril (ALTACE) 1.25 MG capsule Take 1.25 mg by mouth daily.      . saxagliptin HCl (ONGLYZA) 2.5 MG TABS tablet Take 5 mg by mouth daily.        Results for orders placed or performed during the hospital encounter of 01/13/16 (from the past 48 hour(s))  Glucose, capillary     Status: Abnormal   Collection Time: 01/13/16  9:53 AM  Result Value Ref Range   Glucose-Capillary 162 (H) 65 - 99 mg/dL  Glucose, capillary     Status: Abnormal   Collection Time: 01/13/16 11:53 AM  Result Value Ref Range   Glucose-Capillary 126 (H) 65 - 99 mg/dL   Comment 1 Notify RN    Comment 2 Document in Chart   Glucose, capillary     Status: Abnormal   Collection Time: 01/13/16  1:51 PM  Result Value Ref Range   Glucose-Capillary 116 (H) 65 - 99 mg/dL   No results found.  Review of Systems  Eyes: Negative.   Cardiovascular: Negative.   Gastrointestinal:  Negative.   Genitourinary: Negative.   Musculoskeletal: Positive for neck pain.  Neurological: Positive for weakness.  Psychiatric/Behavioral: Negative.     Blood pressure 127/65, pulse 103, temperature 97.9 F (36.6 C), temperature source Oral, resp. rate 20, height  (1.676 m), weight 129.275 kg (285 lb), SpO2 98 %. Physical Exam  Constitutional: She is oriented to person, place, and time. She appears well-developed and well-nourished.  Morbidly obese  HENT:  Head: Normocephalic and atraumatic.  Eyes: Conjunctivae and EOM are normal. Pupils are equal, round, and reactive to light.  Neck: Normal range of motion.  Positive Spurling on turning to the left  Cardiovascular: Normal rate and regular rhythm.   Respiratory: Effort normal and breath sounds normal.  GI: Soft. Bowel sounds are normal.  Musculoskeletal:  Tenderness in the supraclavicular fossa on the left to direct palpation no masses noted  Neurological: She is alert and oriented to person, place, and time.  Decreased grip strength and radial wrist extensor strength on the left  Skin: Skin is warm and dry.  Psychiatric: She has a normal mood and affect. Her behavior is normal. Judgment and thought content normal.     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 Plan: Anterior cervical decompression C5-C6 arthroplasty C5-C6  Stefani Dama, MD 01/13/2016, 3:10 PM

## 2016-01-13 NOTE — Op Note (Signed)
Date of surgery: 01/13/2016  Preoperative diagnosis: Spondylosis and radiculopathy and myelopathy C5-C6  Postoperative diagnosis: Spondylosis with radiculopathy and myelopathy C5-C6  Procedure: C5-C6 Anterior cervical decompression with the arthrodesis using structural  allograft with demineralized bone matrix. Anterior fixation using nuvasive plating system C5-C6 Surgeon: Barnett AbuHenry Rayelle Armor, MD Asst.: Freda JacksonNeelesh Nunudkumar  Anesthesia: Gen. endotracheal  Estimated blood loss: Less than 50 mL  Drains: None  Complications: None  Indications: Patient is a 60 year old individual who's had significant neck shoulder and left arm pain she has evidence of significant spondylitic changes at C5-C6 with by foraminal narrowing she has evidence of a left C6 radiculopathy. She's been advised regarding the need for surgery  Procedure: The patient was brought to the operating room on a stretcher was placed on the table in the supine position. General endotracheal anesthesia was induced the anterior border of the neck was inspected prepped with alcohol and DuraPrep and draped in a sterile fashion. Transverse incision was created in the anterior border of the neck and carried down through the platysma. The plane between the sternocleidomastoid and strap muscles dissected bluntly until the first prevertebral space was reached. This space was identified as C5-C6 on the radiograph. The dissection was then completed undermining longus coli muscle to allow placement of a self-retaining Caspar retractor. The anterior longitudinal ligament was then opened with a #15 blade and ventral osteophytes removed using a Financial plannerLeksell rongeur and Beyer rongeur. The disc space was evacuated of substantial quantity of the degenerated and desiccated disc material. Region of the posterior longitudinal ligament was reached and a self-retaining disc space spreader was placed into the interspace. A high-speed drill was used to remove bony osteophytes  from the inferior margin of the superior endplate and also to remove overgrown lateral osteophytes and the uncinate processes. The nerve roots were decompressed using a 1 and 2 mm Kerrison punch. Hemostasis was established using small pledgets of Gelfoam soaked in thrombin and cottonoid patties. These were later irrigated away. The endplates were drilled smoothed with a 4 mm barrel bit.  A 6 mm cortical ring allograft was prepared by drilling to the appropriate size and configuration to fit in the interspace. It was then field with demineralized bone matrix and autograft and placed in the interspace. The anterior border of the vertebrae was then prepared for plating. A 22 mm plate was secured to the vertebral bodies with 13 mm millimeters screws. The  hemostasis was then checked and secured with the bipolar cautery and Gelfoam soaked pledgets. These were later irrigated away. A final radiograph was obtained confirming position of the hardware. When hemostasis was secured the platysma was closed with 3-0 Vicryl and 3-0 Vicryl is used to close subcuticular skin.

## 2016-01-13 NOTE — Progress Notes (Signed)
Patient alert and oriented x 4, skin warm and dry, IV infusing updated on delay. Comfort measures offered.

## 2016-01-13 NOTE — Anesthesia Procedure Notes (Signed)
Procedure Name: Intubation Date/Time: 01/13/2016 7:43 PM Performed by: Amy RiceBILOTTA, Amy Strollo Z Pre-anesthesia Checklist: Patient identified, Timeout performed, Emergency Drugs available, Suction available and Patient being monitored Patient Re-evaluated:Patient Re-evaluated prior to inductionOxygen Delivery Method: Circle system utilized Preoxygenation: Pre-oxygenation with 100% oxygen Intubation Type: Combination inhalational/ intravenous induction Ventilation: Mask ventilation without difficulty Laryngoscope Size: Mac and 3 Grade View: Grade I Tube type: Oral Tube size: 7.5 mm Number of attempts: 1 Airway Equipment and Method: Stylet Placement Confirmation: ETT inserted through vocal cords under direct vision,  breath sounds checked- equal and bilateral and positive ETCO2 Secured at: 22 cm Tube secured with: Tape Dental Injury: Teeth and Oropharynx as per pre-operative assessment

## 2016-01-13 NOTE — Progress Notes (Signed)
Patient ID: Gordy ClementDeborah Livingston, female   DOB: 09/16/1955, 60 y.o.   MRN: [id number] Vital signs are stable Motor function is intact Feels reasonably comfortable Stable postop

## 2016-01-13 NOTE — Progress Notes (Signed)
Pt reporting pin 10/10 pt still not fully awake and on [place] at 5L sats 91-92% having to be prompted to take good deep breaths.  Pt doesn't appear to be in a great deal of discomfort.  VSS.  Will continue to monitor.

## 2016-01-13 NOTE — Transfer of Care (Signed)
Immediate Anesthesia Transfer of Care Note  Patient: Amy ClementDeborah Livingston  Procedure(s) Performed: Procedure(s) with comments: C5-6 ANTERIOR CERVICAL DECOMPRESSION/DISKECTOMY/FUSION (N/A) - C5-6 ANTERIOR CERVICAL DECOMPRESSION/DISKECTOMY/FUSION  Patient Location: PACU  Anesthesia Type:General  Level of Consciousness: sedated  Airway & Oxygen Therapy: Patient Spontanous Breathing and Patient connected to face mask oxygen  Post-op Assessment: Report given to RN and Post -op Vital signs reviewed and stable  Post vital signs: Reviewed and stable  Last Vitals:  Filed Vitals:   01/13/16 0950  BP: 127/65  Pulse: 103  Temp: 36.6 C  Resp: 20    Last Pain: There were no vitals filed for this visit.       Complications: No apparent anesthesia complications

## 2016-01-13 NOTE — Anesthesia Preprocedure Evaluation (Addendum)
Anesthesia Evaluation  Patient identified by MRN, date of birth, ID band Patient awake    Reviewed: Allergy & Precautions, NPO status , Patient's Chart, lab work & pertinent test results  History of Anesthesia Complications (+) PONV and history of anesthetic complications  Airway Mallampati: II  TM Distance: >3 FB Neck ROM: Limited    Dental  (+) Dental Advisory Given, Upper Dentures, Lower Dentures   Pulmonary neg pulmonary ROS,    Pulmonary exam normal breath sounds clear to auscultation       Cardiovascular hypertension, Pt. on medications Normal cardiovascular exam Rhythm:Regular Rate:Normal     Neuro/Psych PSYCHIATRIC DISORDERS Anxiety    GI/Hepatic Neg liver ROS, GERD  Medicated,  Endo/Other  diabetes, Type 2, Oral Hypoglycemic Agents, Insulin DependentMorbid obesity  Renal/GU negative Renal ROS     Musculoskeletal negative musculoskeletal ROS (+)   Abdominal   Peds  Hematology negative hematology ROS (+)   Anesthesia Other Findings Day of surgery medications reviewed with the patient.  Reproductive/Obstetrics                          Anesthesia Physical Anesthesia Plan  ASA: III  Anesthesia Plan: General   Post-op Pain Management:    Induction: Intravenous  Airway Management Planned: Oral ETT and Video Laryngoscope Planned  Additional Equipment:   Intra-op Plan:   Post-operative Plan: Extubation in OR  Informed Consent: I have reviewed the patients History and Physical, chart, labs and discussed the procedure including the risks, benefits and alternatives for the proposed anesthesia with the patient or authorized representative who has indicated his/her understanding and acceptance.   Dental advisory given  Plan Discussed with: CRNA  Anesthesia Plan Comments: (Risks/benefits of general anesthesia discussed with patient including risk of damage to teeth, lips, gum, and  tongue, nausea/vomiting, allergic reactions to medications, and the possibility of heart attack, stroke and death.  All patient questions answered.  Patient wishes to proceed.)        Anesthesia Quick Evaluation

## 2016-01-14 ENCOUNTER — Encounter (HOSPITAL_COMMUNITY): Payer: Self-pay | Admitting: Neurological Surgery

## 2016-01-14 DIAGNOSIS — M4712 Other spondylosis with myelopathy, cervical region: Secondary | ICD-10-CM | POA: Diagnosis not present

## 2016-01-14 LAB — GLUCOSE, CAPILLARY
GLUCOSE-CAPILLARY: 94 mg/dL (ref 65–99)
Glucose-Capillary: 391 mg/dL — ABNORMAL HIGH (ref 65–99)

## 2016-01-14 MED ORDER — CYCLOBENZAPRINE HCL 10 MG PO TABS: 10.0000 mg | ORAL_TABLET | Freq: Three times a day (TID) | ORAL | Status: DC | PRN

## 2016-01-14 MED ORDER — OXYCODONE-ACETAMINOPHEN 5-325 MG PO TABS: 1.0000 | ORAL_TABLET | ORAL | Status: DC | PRN

## 2016-01-14 NOTE — Progress Notes (Signed)
Pt and family given D/C instructions with Rx's, verbal understanding was provided. Pt's incision is open to air and is clean and dry with no sign of infection. Pt's IV was removed prior to D/C. Pt received shower stool from [facility] prior to D/C. Pt D/C'd home via wheelchair @ 1120 per MD order. Pt is stable @ D/C and has no other needs at this time. Rema FendtAshley Davy Westmoreland, RN

## 2016-01-14 NOTE — Discharge Instructions (Signed)

## 2016-01-14 NOTE — Evaluation (Addendum)
Occupational Therapy Evaluation Patient Details Name: Amy ClementDeborah Dicenzo MRN: [id number] DOB: 08/20/1955 Today's Date: 01/14/2016    History of Present Illness 60 y.o. s/p C5-C6 Anterior cervical decompression with the arthrodesis using structural allograft with demineralized bone matrix. Anterior fixation using nuvasive plating system C5-C6. PMH includes DM, PONV, HTN, anxiety, GERD, SOB-dyspnea, previous back surgeries. Pt with obesity.    Clinical Impression   Pt s/p above. Education provided to pt and family. OT signing off.     Follow Up Recommendations  No OT follow up;Supervision - Intermittent    Equipment Recommendations  Tub/shower seat    Recommendations for Other Services       Precautions / Restrictions Precautions Precautions: Fall;Cervical Precaution Comments: discussed limiting neck motion and no extreme motions Restrictions Weight Bearing Restrictions: No      Mobility Bed Mobility Overal bed mobility: Needs Assistance Bed Mobility: Rolling;Sidelying to Sit;Sit to Sidelying Rolling: Supervision Sidelying to sit: Supervision     Sit to sidelying: Supervision General bed mobility comments: cues for technique.  Transfers Overall transfer level: Needs assistance   Transfers: Sit to/from Stand Sit to Stand: Supervision              Balance Overall balance assessment: History of Falls; Min guard for ambulation and for single leg stance without UE support.                                           ADL Overall ADL's : Needs assistance/impaired       Grooming Details (indicate cue type and reason): reports daughter helped her fix her hair     Lower Body Bathing: Set up;Supervison/ safety;Sit to/from stand   Upper Body Dressing : Set up;Supervision/safety;Sitting   Lower Body Dressing: Set up;Supervision/safety;Sit to/from stand   Toilet Transfer: Min guard;Ambulation (sit to stand from bed)       Tub/ Shower Transfer: Tub  transfer;Min guard;Ambulation   Functional mobility during ADLs: Min guard General ADL Comments: Discussed incorporating precautions into functional activities. Discussed UB clothing that may be easier to manage/maintain precautions. Discussed tub transfer techniques and options for shower chairs. Educated on safety such as recommended someone be with her for tub transfer and near for bathing, rugs/items on floor, and safe footwear.      Vision     Perception     Praxis      Pertinent Vitals/Pain Pain Assessment: 0-10 Pain Score:  (3-4) Pain Location: neck and back Pain Descriptors / Indicators: Sore;Tingling     Hand Dominance     Extremity/Trunk Assessment Upper Extremity Assessment Upper Extremity Assessment: Overall WFL for tasks assessed   Lower Extremity Assessment Lower Extremity Assessment: Defer to PT evaluation       Communication Communication Communication: No difficulties   Cognition Arousal/Alertness: Awake/alert Behavior During Therapy: WFL for tasks assessed/performed Overall Cognitive Status: Within Functional Limits for tasks assessed                     General Comments       Exercises       Shoulder Instructions      Home Living Family/patient expects to be discharged to:: Private residence Living Arrangements: Children (daughter) Available Help at Discharge: Family;Available 24 hours/day Type of Home: Apartment Home Access: Stairs to enter Entrance Stairs-Number of Steps: 3 Entrance Stairs-Rails: Right Home Layout: Two level Alternate Level Stairs-Number of  Steps: 13 Alternate Level Stairs-Rails: Left Bathroom Shower/Tub: Tub/shower unit   Bathroom Toilet: Standard     Home Equipment: Hand held shower head;Adaptive equipment;Cane - single point Adaptive Equipment: Reacher        Prior Functioning/Environment Level of Independence: Needs assistance    ADL's / Homemaking Assistance Needed: assist with cleaning, laundry,  and getting things out of cabinet        OT Diagnosis: Acute pain   OT Problem List:     OT Treatment/Interventions:      OT Goals(Current goals can be found in the care plan section)    OT Frequency:     Barriers to D/C:            Co-evaluation              End of Session Nurse Communication: Other (comment) (shower equipment and pt asking about collar)  Activity Tolerance: Patient tolerated treatment well Patient left: in bed;with family/visitor present   Time: 6962-9528 OT Time Calculation (min): 29 min Charges:  OT General Charges $OT Visit: 1 Procedure OT Evaluation $OT Eval Moderate Complexity: 1 Procedure OT Treatments $Self Care/Home Management : 8-22 mins G-Codes: OT G-codes **NOT FOR INPATIENT CLASS** Functional Assessment Tool Used: clinical judgment Functional Limitation: Self care Self Care Current Status (U1324): At least 1 percent but less than 20 percent impaired, limited or restricted Self Care Goal Status (M0102): At least 1 percent but less than 20 percent impaired, limited or restricted Self Care Discharge Status [phone]): At least 1 percent but less than 20 percent impaired, limited or restricted  Earlie Raveling OTR/L [phone] 01/14/2016, 11:12 AM

## 2016-01-14 NOTE — Anesthesia Postprocedure Evaluation (Signed)
Anesthesia Post Note  Patient: Amy ClementDeborah Livingston  Procedure(s) Performed: Procedure(s) (LRB): C5-6 ANTERIOR CERVICAL DECOMPRESSION/DISKECTOMY/FUSION (N/A)  Patient location during evaluation: PACU Anesthesia Type: General Level of consciousness: awake Pain management: pain level controlled Vital Signs Assessment: post-procedure vital signs reviewed and stable Respiratory status: spontaneous breathing Cardiovascular status: stable Postop Assessment: no signs of nausea or vomiting Anesthetic complications: no    Last Vitals:  Filed Vitals:   01/13/16 2238 01/14/16 0400  BP: 144/72 149/73  Pulse: 104 117  Temp: 36.7 C 36.7 C  Resp: 22 20    Last Pain:  Filed Vitals:   01/14/16 0535  PainSc: 5                  Marcell Pfeifer

## 2016-01-14 NOTE — Evaluation (Signed)
Physical Therapy Evaluation and Discharge Patient Details Name: Amy Livingston MRN: [id number] DOB: March 12, 1956 Today's Date: 01/14/2016   History of Present Illness  60 y.o. s/p C5-C6 Anterior cervical decompression with the arthrodesis using structural allograft with demineralized bone matrix. Anterior fixation using nuvasive plating system C5-C6. PMH includes DM, PONV, HTN, anxiety, GERD, SOB-dyspnea, previous back surgeries. Pt with obesity.   Clinical Impression  Patient evaluated by Physical Therapy with no further acute PT needs identified. All education has been completed and the patient has no further questions. At the time of PT eval pt was able to perform transfers and ambulation with gross supervision for safety. Pt will have family support 24/7 through the weekend if needed. See below for any follow-up Physical Therapy or equipment needs. PT is signing off. Thank you for this referral.     Follow Up Recommendations Outpatient PT;Supervision for mobility/OOB    Equipment Recommendations  None recommended by PT    Recommendations for Other Services       Precautions / Restrictions Precautions Precautions: Fall;Cervical Precaution Comments: discussed limiting neck motion and no extreme motions Restrictions Weight Bearing Restrictions: No      Mobility  Bed Mobility Overal bed mobility: Needs Assistance Bed Mobility: Rolling;Sidelying to Sit Rolling: Supervision Sidelying to sit: Supervision     Sit to sidelying: Supervision General bed mobility comments: VC's for proper log roll technique. Pt was able to complete transition to EOB without physical assist.  Transfers Overall transfer level: Needs assistance Equipment used: None Transfers: Sit to/from Stand Sit to Stand: Supervision         General transfer comment: Pt was able to power up to full standing position without assistance and no unsteadiness/LOB noted.   Ambulation/Gait Ambulation/Gait assistance:  Min guard;Supervision Ambulation Distance (Feet): 400 Feet Assistive device: None Gait Pattern/deviations: Step-through pattern;Decreased stride length;Trunk flexed Gait velocity: Decreased Gait velocity interpretation: Below normal speed for age/gender General Gait Details: Min guard initially progressing to supervision by end of gait training.   Stairs Stairs: Yes Stairs assistance: Min guard Stair Management: One rail Left;Step to pattern;Forwards Number of Stairs: 10 General stair comments: Pt reports that she typically leans elbow on railing at home for support. Pt was cued for proper posture and was able to complete flight without assist. Close hands-on guard was provided for safety as pt appeared anxious about stair training.   Wheelchair Mobility    Modified Rankin (Stroke Patients Only)       Balance Overall balance assessment: Needs assistance Sitting-balance support: Feet supported;No upper extremity supported Sitting balance-Leahy Scale: Good     Standing balance support: No upper extremity supported;During functional activity Standing balance-Leahy Scale: Fair                               Pertinent Vitals/Pain Pain Assessment: Faces Pain Score:  (3-4) Faces Pain Scale: Hurts little more Pain Location: neck and back Pain Descriptors / Indicators: Operative site guarding;Sore Pain Intervention(s): Monitored during session;Limited activity within patient's tolerance;Repositioned    Home Living Family/patient expects to be discharged to:: Private residence Living Arrangements: Children (daughter) Available Help at Discharge: Family;Available 24 hours/day Type of Home: Apartment Home Access: Stairs to enter Entrance Stairs-Rails: Right Entrance Stairs-Number of Steps: 3 Home Layout: Two level Home Equipment: Hand held shower head;Adaptive equipment;Cane - single point      Prior Function Level of Independence: Needs assistance      ADL's /  Homemaking  Assistance Needed: assist with cleaning, laundry, and getting things out of cabinet        Hand Dominance   Dominant Hand: Right    Extremity/Trunk Assessment   Upper Extremity Assessment: Defer to OT evaluation           Lower Extremity Assessment: Generalized weakness      Cervical / Trunk Assessment: Other exceptions  Communication   Communication: No difficulties  Cognition Arousal/Alertness: Awake/alert Behavior During Therapy: WFL for tasks assessed/performed Overall Cognitive Status: Within Functional Limits for tasks assessed                      General Comments      Exercises        Assessment/Plan    PT Assessment Patent does not need any further PT services  PT Diagnosis Difficulty walking;Acute pain   PT Problem List    PT Treatment Interventions     PT Goals (Current goals can be found in the Care Plan section) Acute Rehab PT Goals Patient Stated Goal: Home today PT Goal Formulation: All assessment and education complete, DC therapy Time For Goal Achievement: 01/21/16 Potential to Achieve Goals: Good    Frequency     Barriers to discharge        Co-evaluation               End of Session Equipment Utilized During Treatment: Gait belt Activity Tolerance: Patient tolerated treatment well Patient left: with call bell/phone within reach;with family/visitor present (Sitting EOB) Nurse Communication: Mobility status    Functional Assessment Tool Used: Clinical judgement Functional Limitation: Mobility: Walking and moving around Mobility: Walking and Moving Around Current Status ([id number]): At least 20 percent but less than 40 percent impaired, limited or restricted Mobility: Walking and Moving Around Goal Status [phone]([id number]): At least 20 percent but less than 40 percent impaired, limited or restricted Mobility: Walking and Moving Around Discharge Status [phone]([id number]): At least 20 percent but less than 40 percent impaired,  limited or restricted    Time: 1050-1103 PT Time Calculation (min) (ACUTE ONLY): 13 min   Charges:   PT Evaluation $PT Eval Moderate Complexity: 1 Procedure     PT G Codes:   PT G-Codes **NOT FOR INPATIENT CLASS** Functional Assessment Tool Used: Clinical judgement Functional Limitation: Mobility: Walking and moving around Mobility: Walking and Moving Around Current Status ([id number]([id number]): At least 20 percent but less than 40 percent impaired, limited or restricted Mobility: Walking and Moving Around Goal Status [phone]([id number]): At least 20 percent but less than 40 percent impaired, limited or restricted Mobility: Walking and Moving Around Discharge Status [phone]([id number]): At least 20 percent but less than 40 percent impaired, limited or restricted    Conni SlipperKirkman, Christopher Glasscock 01/14/2016, 11:24 AM   Conni SlipperLaura Sierah Lacewell, PT, DPT Acute Rehabilitation Services Pager: [phone]

## 2016-01-14 NOTE — Discharge Summary (Signed)
Physician Discharge Summary  Patient ID: Amy Livingston MRN: [id number] DOB/AGE: [birth year] 60 y.o.  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 She tolerated surgery well.  Consults: None  Significant Diagnostic Studies: None  Treatments: surgery: Anterior cervical decompression C5-C6 arthrodesis with structural allograft anterior plate fixation [id number]  Discharge Exam: Blood pressure 143/71, pulse 113, temperature 98.1 F (36.7 C), temperature source Oral, resp. rate 20, height  (1.676 m), weight 129.275 kg (285 lb), SpO2 95 %. Incision is clean and dry motor function is intact in upper and lower extremities. Station and gait are intact. Swallowing function is normal  Disposition: 01-Home or Self Care  Discharge Instructions    Call MD for:  redness, tenderness, or signs of infection (pain, swelling, redness, odor or green/yellow discharge around incision site)    Complete by:  As directed      Call MD for:  severe uncontrolled pain    Complete by:  As directed      Call MD for:  temperature >100.4    Complete by:  As directed      Diet - low sodium heart healthy    Complete by:  As directed      Discharge instructions    Complete by:  As directed   Okay to shower. Do not apply salves or appointments to incision. No heavy lifting with the upper extremities greater than 15 pounds. May resume driving when not requiring pain medication and patient feels comfortable with doing so.     Increase activity slowly    Complete by:  As directed             Medication List    TAKE these medications        ALPRAZolam 1 MG tablet  Commonly known as:  XANAX  Take 1 mg by mouth 2 (two) times daily as needed for anxiety.     atorvastatin 80 MG tablet  Commonly known as:  LIPITOR  Take 80 mg by mouth daily.     benazepril 20 MG tablet  Commonly known as:  LOTENSIN  Take 20 mg by mouth daily.     cyclobenzaprine 10 MG tablet  Commonly known as:  FLEXERIL  Take 10 mg by mouth 3 (three) times daily as needed. For muscle spasms     cyclobenzaprine 10 MG tablet  Commonly known as:  FLEXERIL  Take 1 tablet (10 mg total) by mouth 3 (three) times daily as needed for muscle spasms.     fenofibrate 145 MG tablet  Commonly known as:  TRICOR  Take 145 mg by mouth daily.     gabapentin 300 MG capsule  Commonly known as:  NEURONTIN  Take 300 mg by mouth 3 (three) times daily.     hydrochlorothiazide 25 MG tablet  Commonly known as:  HYDRODIURIL  Take 25 mg by mouth daily.     HYDROcodone-acetaminophen 10-500 MG tablet  Commonly known as:  LORTAB  Take 1 tablet by mouth every 6 (six) hours as needed for pain.     ibuprofen 800 MG tablet  Commonly known as:  ADVIL,MOTRIN  Take 800 mg by mouth 2 (two) times daily as needed for moderate pain.     insulin  detemir 100 UNIT/ML injection  Commonly known as:  LEVEMIR  Inject 50 Units into the skin at bedtime.     insulin lispro protamine-lispro (50-50) 100 UNIT/ML Susp injection  Commonly known as:  HUMALOG 50/50 MIX  Inject 40-60 Units into the skin 3 (three) times daily. 60 units in morning 40 units at noon 60 units in the evening     meclizine 25 MG tablet  Commonly known as:  ANTIVERT  Take 25 mg by mouth daily as needed for dizziness.     metFORMIN 1000 MG tablet  Commonly known as:  GLUCOPHAGE  Take 1,000 mg by mouth 2 (two) times daily.     omeprazole 40 MG capsule  Commonly known as:  PRILOSEC  Take 40 mg by mouth daily.     oxyCODONE-acetaminophen 5-325 MG tablet  Commonly known as:  PERCOCET/ROXICET  Take 1-2 tablets by  mouth every 4 (four) hours as needed for moderate pain.     potassium chloride 10 MEQ CR tablet  Commonly known as:  KLOR-CON  Take 10 mEq by mouth daily.     ramipril 1.25 MG capsule  Commonly known as:  ALTACE  Take 1.25 mg by mouth daily.     saxagliptin HCl 2.5 MG Tabs tablet  Commonly known as:  ONGLYZA  Take 5 mg by mouth daily.     simvastatin 20 MG tablet  Commonly known as:  ZOCOR  Take 20 mg by mouth daily.     TRADJENTA 5 MG Tabs tablet  Generic drug:  linagliptin  Take 5 mg by mouth daily.     VITAMIN B1-B12 IM  Inject 1 mg into the muscle every 30 (thirty) days.         SignedStefani Dama: Katrina Daddona J 01/14/2016, 9:09 AM

## 2016-02-17 ENCOUNTER — Telehealth (HOSPITAL_COMMUNITY): Payer: Self-pay

## 2016-02-17 NOTE — Telephone Encounter (Signed)
02/17/16 numerous messages have been left (6/30, 7/6, 7/10) and patient has not returned any of our phone calls.  On HOLD

## 2016-03-22 ENCOUNTER — Other Ambulatory Visit: Payer: Self-pay | Admitting: Neurological Surgery

## 2016-03-22 ENCOUNTER — Other Ambulatory Visit (HOSPITAL_COMMUNITY): Payer: Self-pay | Admitting: Neurological Surgery

## 2016-03-22 DIAGNOSIS — M48061 Spinal stenosis, lumbar region without neurogenic claudication: Secondary | ICD-10-CM

## 2016-04-01 ENCOUNTER — Ambulatory Visit (HOSPITAL_COMMUNITY)
Admission: RE | Admit: 2016-04-01 | Discharge: 2016-04-01 | Disposition: A | Discharge status: Home / Self Care | Payer: Medicaid Other | Source: Ambulatory Visit | Attending: Neurological Surgery | Admitting: Neurological Surgery

## 2016-04-01 DIAGNOSIS — M5126 Other intervertebral disc displacement, lumbar region: Secondary | ICD-10-CM | POA: Insufficient documentation

## 2016-04-01 DIAGNOSIS — M8938 Hypertrophy of bone, other site: Secondary | ICD-10-CM | POA: Insufficient documentation

## 2016-04-01 DIAGNOSIS — M4806 Spinal stenosis, lumbar region: Secondary | ICD-10-CM | POA: Diagnosis not present

## 2016-04-01 DIAGNOSIS — M48061 Spinal stenosis, lumbar region without neurogenic claudication: Secondary | ICD-10-CM

## 2016-05-20 ENCOUNTER — Ambulatory Visit (HOSPITAL_COMMUNITY): Payer: Medicaid Other | Attending: Anesthesiology | Admitting: Physical Therapy

## 2016-05-20 DIAGNOSIS — M545 Low back pain: Secondary | ICD-10-CM | POA: Insufficient documentation

## 2016-05-20 DIAGNOSIS — R262 Difficulty in walking, not elsewhere classified: Secondary | ICD-10-CM

## 2016-05-20 DIAGNOSIS — G8929 Other chronic pain: Secondary | ICD-10-CM

## 2016-05-20 NOTE — Therapy (Signed)
[facility] [address] [place], [place], [address] Phone: [phone]   Fax:  [phone]  Physical Therapy Evaluation  Patient Details  Name: Amy Livingston MRN: [id number] Date of Birth: 08-Jan-1956 Referring Provider: Tonye Royalty   Encounter Date: 05/20/2016      PT End of Session - 05/20/16 1503    Visit Number 1   Number of Visits 1   Authorization Type Medicaid    Authorization Time Period 05/20/16 to 05/20/16   Authorization - Visit Number 0   Authorization - Number of Visits 1   PT Start Time 1432   PT Stop Time 1455   PT Time Calculation (min) 23 min   Activity Tolerance Patient tolerated treatment well      Past Medical History:  Diagnosis Date  . Anxiety   . Diabetes mellitus without complication (HCC)   . GERD (gastroesophageal reflux disease)   . Hypertension   . PONV (postoperative nausea and vomiting)   . Shortness of breath dyspnea     Past Surgical History:  Procedure Laterality Date  . ABDOMINAL HYSTERECTOMY    . ANTERIOR CERVICAL DECOMP/DISCECTOMY FUSION N/A 01/13/2016   Procedure: C5-6 ANTERIOR CERVICAL DECOMPRESSION/DISKECTOMY/FUSION;  Surgeon: Barnett Abu, MD;  Location: [facility];  Service: Neurosurgery;  Laterality: N/A;  C5-6 ANTERIOR CERVICAL DECOMPRESSION/DISKECTOMY/FUSION  . BACK SURGERY     x 5  . CHOLECYSTECTOMY    . KNEE ARTHROSCOPY     left  . TUBAL LIGATION      There were no vitals filed for this visit.       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 She reports she has relief for a few years bewtween each surgery. She is aware of Medicaid benefits/limitatinos.    Pertinent History anxiety, DM, GERD, HTN, x7 lumbar surgeries, cervical surgeries    How long can you sit comfortably?  20-30 minuites    How long can you walk comfortably? severe limitation    Patient Stated Goals she is not sure   Currently in Pain? No/denies  8-9/10            [facility] PT Assessment - 05/20/16 0001      Assessment   Medical Diagnosis low back pain    Referring Provider Tonye Royalty    Onset Date/Surgical Date --  chronic    Next MD Visit November 8th with referring MD      Balance Screen   Has the patient fallen in the past 6 months No   Has the patient had a decrease in activity level because of a fear of falling?  No   Is the patient reluctant to leave their home because of a fear of falling?  No     Prior Function   Level of Independence Independent;Independent with basic ADLs;Independent with gait;Independent with transfers   Vocation On disability     AROM   Lumbar Flexion WFL    Lumbar Extension WFL    Lumbar - Right Side Bend fingertips to midline of knee joint    Lumbar - Left Side Bend fingertips to middle of thigh      Strength   Overall Strength Comments strength approximately 4-4+/5 with general screen  PT Education - 05/20/16 1500    Education provided Yes   Education Details no apparent skilled PT services needed, no charge for today's session    Person(s) Educated Patient   Methods Explanation   Comprehension Verbalized understanding                    Plan - 05/20/16 1503    Clinical Impression Statement Patient arrives stating she is "not sure why I was referred to skilled PT services" as she has had no major functional changes and has no functional difficulties on an acute basis; upon brief screen of ROM and strength and conversation about functional abilities, patient appears to be doing very well and is not in need of skilled PT services at this time. No charge for today's session.    PT Frequency One time visit   PT Next Visit Plan no further skilled services needed    PT Home  Exercise Plan none    Consulted and Agree with Plan of Care Patient      Patient will benefit from skilled therapeutic intervention in order to improve the following deficits and impairments:  Pain, Postural dysfunction, Decreased range of motion, Decreased strength, Hypomobility, Difficulty walking  Visit Diagnosis: Chronic midline low back pain, with sciatica presence unspecified - Plan: PT plan of care cert/re-cert  Difficulty in walking, not elsewhere classified - Plan: PT plan of care cert/re-cert     Problem List Patient Active Problem List   Diagnosis Date Noted  . Cervical spondylosis with myelopathy 01/13/2016    Nedra HaiKristen Patrich Heinze PT, DPT [phone]  [facility] [address] [place], [place], [address] Phone: [phone]   Fax:  [phone]  Name: Amy Livingston MRN: [id number] Date of Birth: 08/20/1955

## 2016-06-28 ENCOUNTER — Encounter (INDEPENDENT_AMBULATORY_CARE_PROVIDER_SITE_OTHER): Payer: Self-pay | Admitting: *Deleted

## 2016-07-18 ENCOUNTER — Ambulatory Visit: Payer: Medicaid Other | Attending: Anesthesiology | Admitting: Neurology

## 2016-07-18 DIAGNOSIS — R5383 Other fatigue: Secondary | ICD-10-CM | POA: Insufficient documentation

## 2016-07-18 DIAGNOSIS — G4710 Hypersomnia, unspecified: Secondary | ICD-10-CM | POA: Diagnosis present

## 2016-07-24 NOTE — Procedures (Signed)
[facility] Zenab Gronewold A. Gerilyn Pilgrimoonquah, MD     [email or web address]             NOCTURNAL POLYSOMNOGRAPHY   LOCATION: [facility]   Patient Name: Amy Livingston, Tashonna Study Date: 07/18/2016 Gender: Female D.O.B: 02-13-1956 Age (years): 60 Referring Provider: Lavenia AtlasMarilyn Foster Height (inches): 66 Interpreting Physician: Beryle BeamsKofi Burlene Montecalvo MD, ABSM Weight (lbs): 296 RPSGT: Alfonso EllisHedrick, Debra BMI: 48 MRN: [id number] Neck Size: 17.00 CLINICAL INFORMATION Sleep Study Type: NPSG  Indication for sleep study: Excessive Daytime Sleepiness, Fatigue  Epworth Sleepiness Score: 2  SLEEP STUDY TECHNIQUE As per the AASM Manual for the Scoring of Sleep and Associated Events v2.3 (April 2016) with a hypopnea requiring 4% desaturations.  The channels recorded and monitored were frontal, central and occipital EEG, electrooculogram (EOG), submentalis EMG (chin), nasal and oral airflow, thoracic and abdominal wall motion, anterior tibialis EMG, snore microphone, electrocardiogram, and pulse oximetry.  MEDICATIONS Medications self-administered by patient taken the night of the study : N/A  Current Outpatient Prescriptions:  .  ALPRAZolam (XANAX) 1 MG tablet, Take 1 mg by mouth 2 (two) times daily as needed for anxiety. , Disp: , Rfl:  .  atorvastatin (LIPITOR) 80 MG tablet, Take 80 mg by mouth daily., Disp: , Rfl:  .  benazepril (LOTENSIN) 20 MG tablet, Take 20 mg by mouth daily., Disp: , Rfl:  .  cyclobenzaprine (FLEXERIL) 10 MG tablet, Take 10 mg by mouth 3 (three) times daily as needed. For muscle spasms , Disp: , Rfl:  .  cyclobenzaprine (FLEXERIL) 10 MG tablet, Take 1 tablet (10 mg total) by mouth 3 (three) times daily as needed for muscle spasms., Disp: 30 tablet, Rfl: 5 .  fenofibrate (TRICOR) 145 MG tablet, Take 145 mg by mouth daily.  , Disp: , Rfl:  .  gabapentin (NEURONTIN) 300 MG capsule, Take 300 mg by mouth 3 (three) times daily., Disp: , Rfl:  .  hydrochlorothiazide (HYDRODIURIL) 25 MG tablet,  Take 25 mg by mouth daily.  , Disp: , Rfl:  .  HYDROcodone-acetaminophen (LORTAB) 10-500 MG per tablet, Take 1 tablet by mouth every 6 (six) hours as needed for pain. , Disp: , Rfl:  .  ibuprofen (ADVIL,MOTRIN) 800 MG tablet, Take 800 mg by mouth 2 (two) times daily as needed for moderate pain. , Disp: , Rfl:  .  insulin detemir (LEVEMIR) 100 UNIT/ML injection, Inject 50 Units into the skin at bedtime., Disp: , Rfl:  .  insulin lispro protamine-lispro (HUMALOG 50/50 MIX) (50-50) 100 UNIT/ML SUSP injection, Inject 40-60 Units into the skin 3 (three) times daily. 60 units in morning 40 units at noon 60 units in the evening, Disp: , Rfl:  .  linagliptin (TRADJENTA) 5 MG TABS tablet, Take 5 mg by mouth daily., Disp: , Rfl:  .  meclizine (ANTIVERT) 25 MG tablet, Take 25 mg by mouth daily as needed for dizziness. , Disp: , Rfl:  .  metFORMIN (GLUCOPHAGE) 1000 MG tablet, Take 1,000 mg by mouth 2 (two) times daily.  , Disp: , Rfl:  .  omeprazole (PRILOSEC) 40 MG capsule, Take 40 mg by mouth daily.  , Disp: , Rfl:  .  oxyCODONE-acetaminophen (PERCOCET/ROXICET) 5-325 MG tablet, Take 1-2 tablets by mouth every 4 (four) hours as needed for moderate pain., Disp: 40 tablet, Rfl: 0 .  potassium chloride (KLOR-CON) 10 MEQ CR tablet, Take 10 mEq by mouth daily.  , Disp: , Rfl:  .  ramipril (ALTACE) 1.25 MG capsule, Take 1.25 mg by mouth daily.  , Disp: ,  Rfl:  .  saxagliptin HCl (ONGLYZA) 2.5 MG TABS tablet, Take 5 mg by mouth daily.  , Disp: , Rfl:  .  simvastatin (ZOCOR) 20 MG tablet, Take 20 mg by mouth daily.  , Disp: , Rfl:  .  VITAMIN B1-B12 IM, Inject 1 mg into the muscle every 30 (thirty) days., Disp: , Rfl:    SLEEP ARCHITECTURE The study was initiated at 10:29:52 PM and ended at 5:08:53 AM.  Sleep onset time was 65.7 minutes and the sleep efficiency was 53.1%. The total sleep time was 211.8 minutes.  Stage REM latency was N/A minutes.  The patient spent 13.69% of the night in stage N1 sleep, 82.77%  in stage N2 sleep, 3.54% in stage N3 and 0.00% in REM.  Alpha intrusion was absent.  Supine sleep was 2.12%.  RESPIRATORY PARAMETERS The overall apnea/hypopnea index (AHI) was 0.3 per hour. There were 0 total apneas, including 0 obstructive, 0 central and 0 mixed apneas. There were 1 hypopneas and 0 RERAs.  The AHI during Stage REM sleep was N/A per hour.  AHI while supine was 0.0 per hour.  The mean oxygen saturation was 94.52%. The minimum SpO2 during sleep was 91.00%.  Loud snoring was noted during this study.  CARDIAC DATA The 2 lead EKG demonstrated sinus rhythm. The mean heart rate was N/A beats per minute. Other EKG findings include: Ventricular Tachycardia. LEG MOVEMENT DATA The total PLMS were 0 with a resulting PLMS index of 0.00. Associated arousal with leg movement index was 0.0.  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 - No significant obstructive sleep apnea occurred during this study. - No significant central sleep apnea occurred during this study.  Argie RammingKofi A Mikylah Ackroyd, MD Diplomate, American Board of Sleep Medicine.

## 2016-09-11 IMAGING — MR MR LUMBAR SPINE W/O CM
4 of 5 series · 16 of 48 positions shown · non-contrast
Comparison: Lumbar spine plain films 12/04/2015. CT myelogram
07/16/2011.

CLINICAL DATA: Low back pain radiating down RIGHT leg since 6447.
Previous lumbar surgery.

EXAM:
MRI LUMBAR SPINE WITHOUT CONTRAST
TECHNIQUE: Multiplanar, multisequence MR imaging of the lumbar spine was
performed. No intravenous contrast was administered.

[Series 3: T2 · sagittal · 4.0mm · 0.78mm/px · 7 of 15 slices shown (1 of 2)]
[im 1/15]
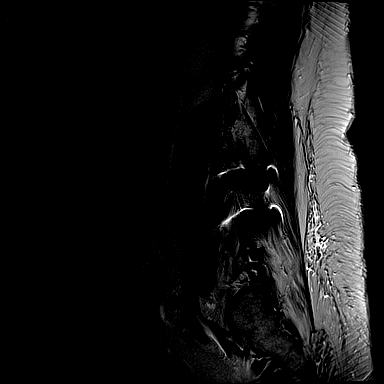
[im 3/15]
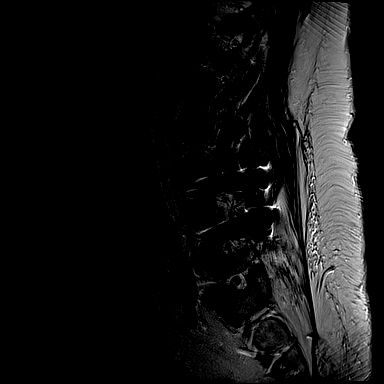
[im 5/15]
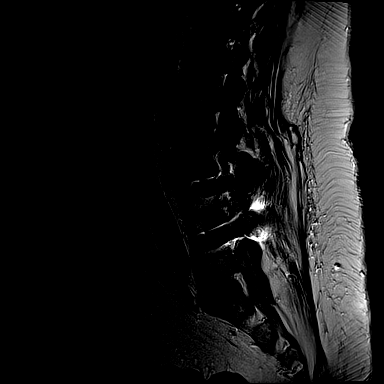
[im 8/15]
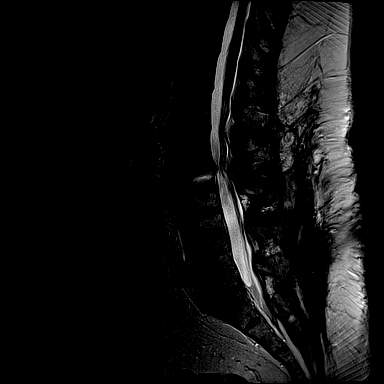
[im 10/15]
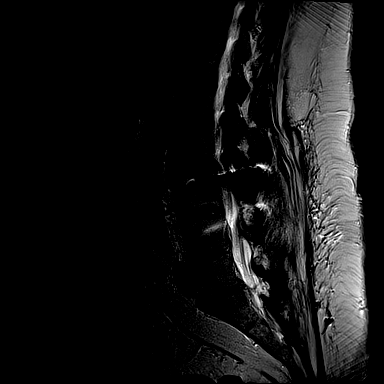
[im 12/15]
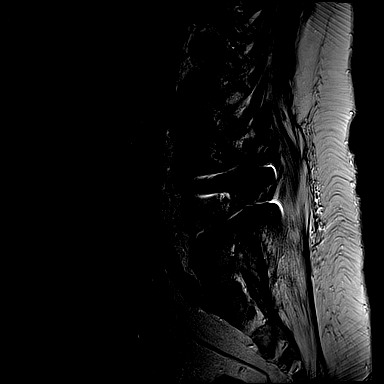
[im 15/15]
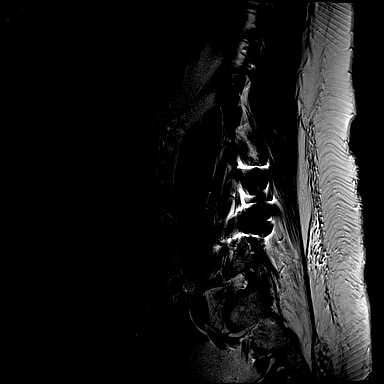

[Series 4: T1 · sagittal · 4.0mm · 0.43mm/px · 3 of 15 slices shown (1 of 2)]
[im 3/15]
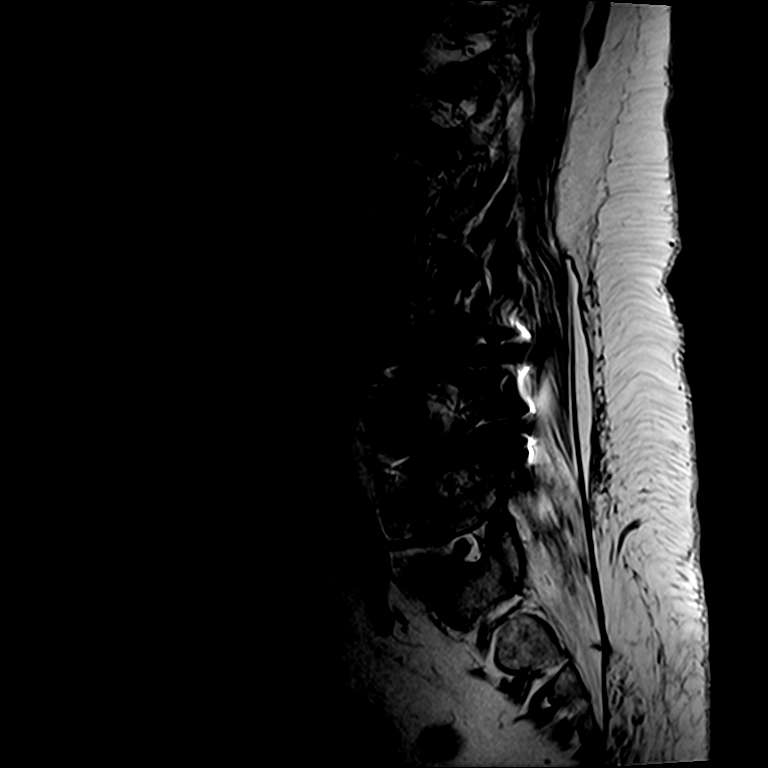
[im 8/15]
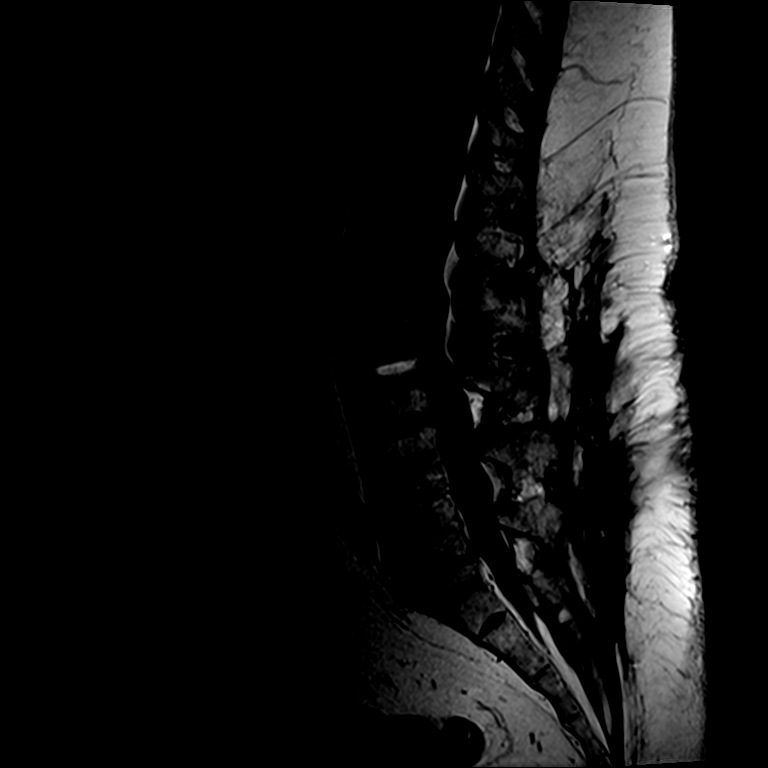
[im 12/15]
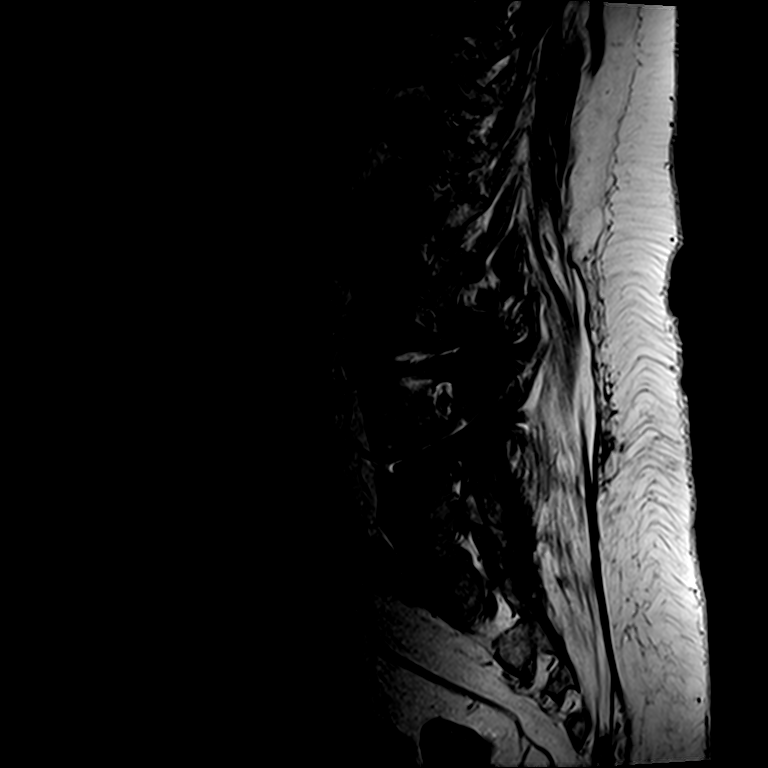

[Series 6: T2 · axial · 4.0mm · 0.24mm/px · z∈[-114,+7]mm · 3 of 33 slices shown (2 of 2)]
[im 5/33]
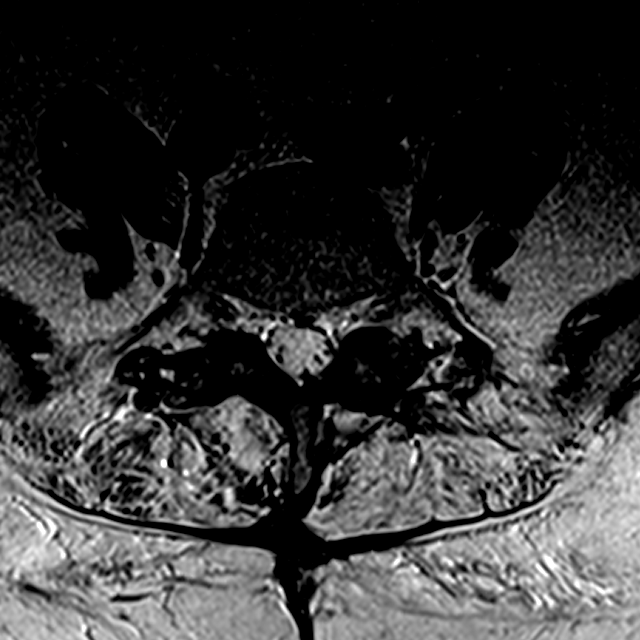
[im 18/33]
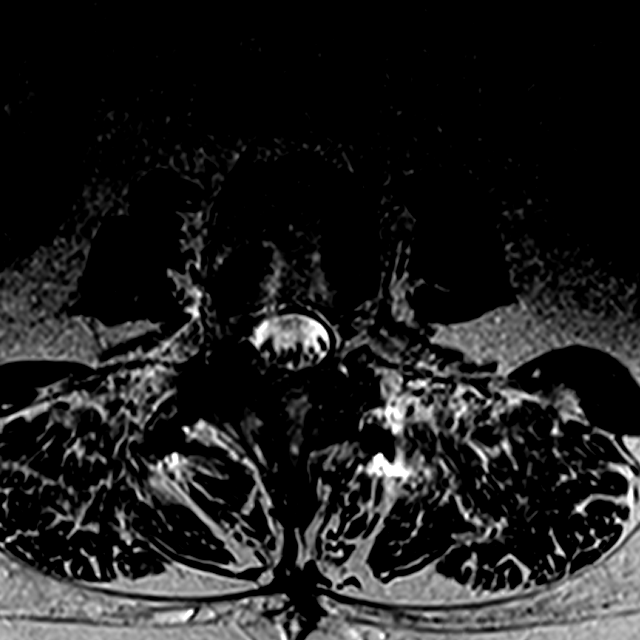
[im 28/33]
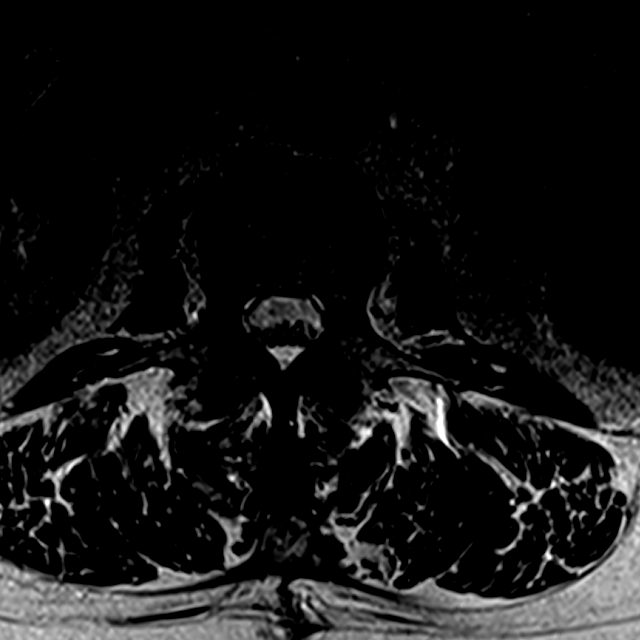

[Series 7: T1 · axial · 4.0mm · 0.24mm/px · z∈[-114,+7]mm · 3 of 33 slices shown (2 of 2)]
[im 5/33]
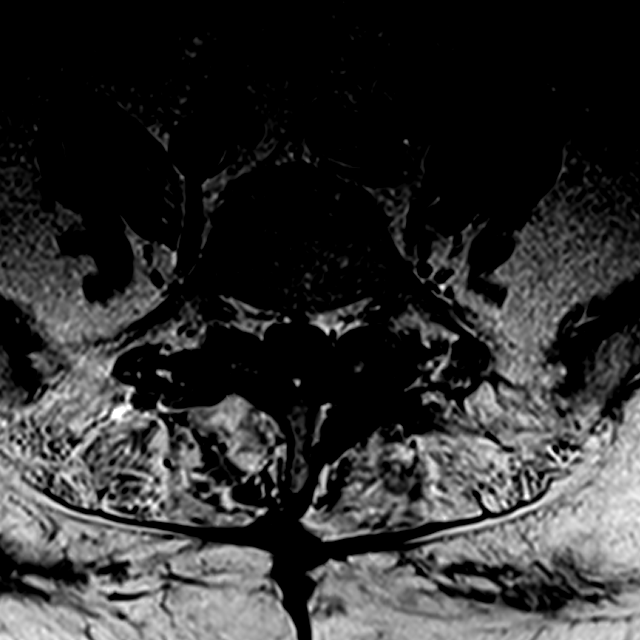
[im 18/33]
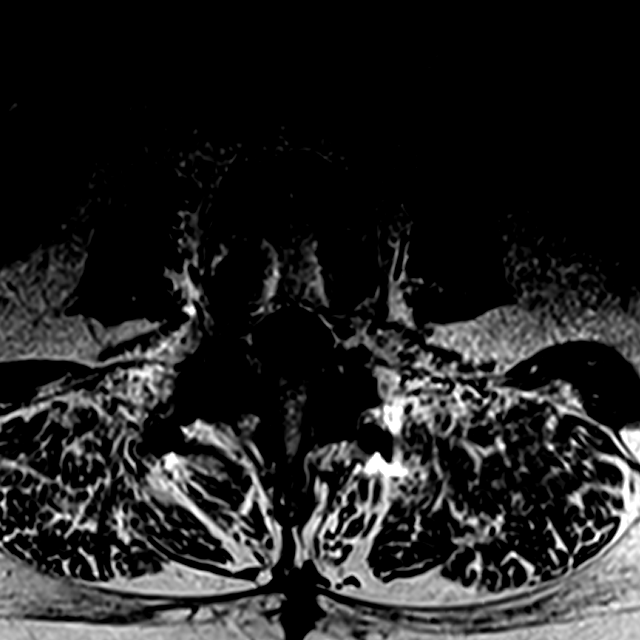
[im 28/33]
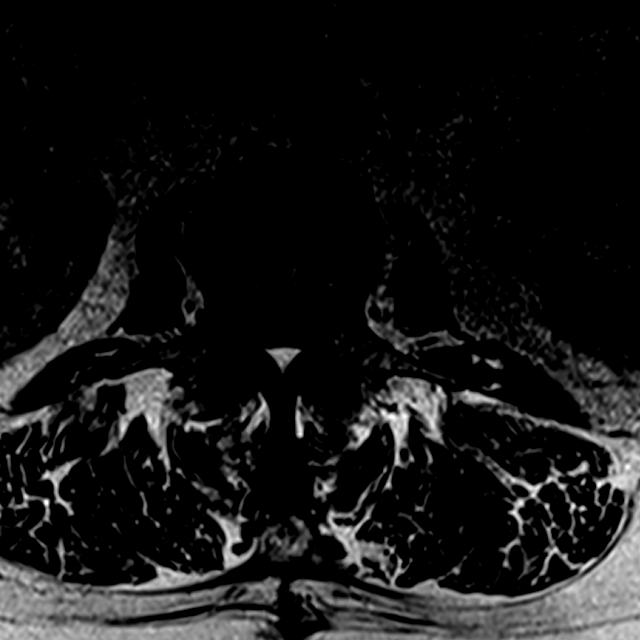

[16 of 48 positions shown; findings below may reference images not displayed]

FINDINGS: Segmentation:  Normal

Alignment: Mild straightening of the normal lumbar lordosis. 2 mm
anterolisthesis L5-S1. 2 mm anterolisthesis L2-3.

Vertebrae: No worrisome osseous lesion. Solid L3 through S1
posterior arthrodesis. Solid interbody fusion at L3-4 and L4-5.
Severe disc space narrowing at L5-S1, functional fusion across the
interspace.

Conus medullaris: Extends to the L1 level and appears normal.

Paraspinal and other soft tissues: Unremarkable

Disc levels:

L1-L2:  Central bulge.  Mild facet arthropathy.  No impingement.

L2-L3: Central protrusion. Advanced facet arthropathy and ligamentum
flavum hypertrophy. BILATERAL joint effusions. Moderate stenosis.
LEFT greater than RIGHT subarticular zone narrowing affect the L3
nerve roots. Mild foraminal narrowing does not clearly affect the L2
nerve roots.

L3-L4:  Solid fusion.  No residual impingement.

L4-L5:  Solid fusion.  No residual impingement.

L5-S1: 2 mm anterolisthesis. Osseous spurring. Posterior
arthrodesis. LEFT laminotomy. No central canal stenosis. No
subarticular zone or foraminal zone narrowing.

Compared with CT myelogram, from 3593, the stenosis at L2-3 appears
slightly worse.
IMPRESSION: Solid L3-S1 arthrodesis.

Adjacent segment disease at L2-3 with central protrusion, posterior
element hypertrophy, and 2 mm slip. LEFT greater than RIGHT
subarticular zone narrowing could affect the L3 nerve roots. Slight
worsening since [DATE].

## 2018-07-18 DIAGNOSIS — M4316 Spondylolisthesis, lumbar region: Secondary | ICD-10-CM | POA: Insufficient documentation

## 2018-07-19 ENCOUNTER — Other Ambulatory Visit (HOSPITAL_COMMUNITY): Payer: Self-pay | Admitting: Neurological Surgery

## 2018-07-19 ENCOUNTER — Other Ambulatory Visit: Payer: Self-pay | Admitting: Neurological Surgery

## 2018-07-19 DIAGNOSIS — M4316 Spondylolisthesis, lumbar region: Secondary | ICD-10-CM

## 2018-07-26 MED ORDER — SODIUM CHLORIDE 0.9 % IV SOLN: 4.0000 mg | Freq: Four times a day (QID) | INTRAVENOUS | Status: DC | PRN

## 2018-08-16 ENCOUNTER — Ambulatory Visit (HOSPITAL_COMMUNITY): Payer: Self-pay

## 2018-08-16 ENCOUNTER — Ambulatory Visit (HOSPITAL_COMMUNITY): Payer: Medicaid Other

## 2018-09-29 ENCOUNTER — Ambulatory Visit (HOSPITAL_COMMUNITY): Payer: Medicaid Other

## 2018-10-05 ENCOUNTER — Ambulatory Visit (HOSPITAL_COMMUNITY): Payer: Medicaid Other

## 2018-10-05 ENCOUNTER — Ambulatory Visit (HOSPITAL_COMMUNITY): Admission: RE | Admit: 2018-10-05 | Payer: Medicaid Other | Source: Ambulatory Visit

## 2019-11-07 DIAGNOSIS — Z6841 Body Mass Index (BMI) 40.0 and over, adult: Secondary | ICD-10-CM | POA: Insufficient documentation

## 2019-11-07 DIAGNOSIS — R030 Elevated blood-pressure reading, without diagnosis of hypertension: Secondary | ICD-10-CM | POA: Insufficient documentation

## 2019-11-19 ENCOUNTER — Other Ambulatory Visit: Payer: Self-pay

## 2019-11-19 ENCOUNTER — Ambulatory Visit: Payer: Medicaid Other | Admitting: Podiatry

## 2019-11-19 DIAGNOSIS — M79674 Pain in right toe(s): Secondary | ICD-10-CM

## 2019-11-19 DIAGNOSIS — E1165 Type 2 diabetes mellitus with hyperglycemia: Secondary | ICD-10-CM

## 2019-11-19 DIAGNOSIS — M79675 Pain in left toe(s): Secondary | ICD-10-CM

## 2019-11-19 DIAGNOSIS — B351 Tinea unguium: Secondary | ICD-10-CM

## 2019-11-20 ENCOUNTER — Encounter: Payer: Self-pay | Admitting: Podiatry

## 2019-11-20 NOTE — Progress Notes (Signed)
  Subjective:  Patient ID: Amy Livingston, female    DOB: [birth year],  MRN: [id number]  Chief Complaint  Patient presents with  . Foot Pain    pt is here for diabetic foot care of the right foot, pain is located at the bottom of the right foot.   64 y.o. female returns for the above complaint.  Patient presents with thickened elongated mycotic toenails x10.  Patient states that she has not been able to debride herself.  She also has complaint of her somewhat of an ingrown to the right hallux which is causing her a lot of pain.  She is a diabetic with last A1c of 10.1.  She also has neuropathic shooting/tingling associated with the tips of the toe.  She denies any other acute complaints.  She would like to just have her toenails debrided down as she is not able to do it herself.  I will hold off on any ingrown toenail procedure given her high A1c  Objective:  There were no vitals filed for this visit. Podiatric Exam: Vascular: dorsalis pedis and posterior tibial pulses are palpable bilateral. Capillary return is immediate. Temperature gradient is WNL. Skin turgor WNL  Sensorium: Normal Semmes Weinstein monofilament test. Normal tactile sensation bilaterally. Nail Exam: Pt has thick disfigured discolored nails with subungual debris noted bilateral entire nail hallux through fifth toenails Ulcer Exam: There is no evidence of ulcer or pre-ulcerative changes or infection. Orthopedic Exam: Muscle tone and strength are WNL. No limitations in general ROM. No crepitus or effusions noted. HAV  B/L.  Hammer toes 2-5  B/L. Skin: No Porokeratosis. No infection or ulcers  Assessment & Plan:  Patient was evaluated and treated and all questions answered.  Onychomycosis with pain  -Nails palliatively debrided as below. -Educated on self-care  Procedure: Nail Debridement Rationale: pain  Type of Debridement: manual, sharp debridement. Instrumentation: Nail nipper, rotary burr. Number of Nails:  10  Procedures and Treatment: Consent by patient was obtained for treatment procedures. The patient understood the discussion of treatment and procedures well. All questions were answered thoroughly reviewed. Debridement of mycotic and hypertrophic toenails, 1 through 5 bilateral and clearing of subungual debris. No ulceration, no infection noted.  Return Visit-Office Procedure: Patient instructed to return to the office for a follow up visit 3 months for continued evaluation and treatment.  Nicholes Rough, DPM    No follow-ups on file.

## 2020-02-18 ENCOUNTER — Other Ambulatory Visit: Payer: Self-pay

## 2020-02-18 ENCOUNTER — Encounter: Payer: Self-pay | Admitting: Podiatry

## 2020-02-18 ENCOUNTER — Ambulatory Visit: Payer: Medicaid Other | Admitting: Podiatry

## 2020-02-18 DIAGNOSIS — E1165 Type 2 diabetes mellitus with hyperglycemia: Secondary | ICD-10-CM

## 2020-02-18 DIAGNOSIS — B351 Tinea unguium: Secondary | ICD-10-CM

## 2020-02-18 DIAGNOSIS — M79675 Pain in left toe(s): Secondary | ICD-10-CM

## 2020-02-18 DIAGNOSIS — M79674 Pain in right toe(s): Secondary | ICD-10-CM | POA: Diagnosis not present

## 2020-02-18 DIAGNOSIS — L600 Ingrowing nail: Secondary | ICD-10-CM

## 2020-02-18 NOTE — Progress Notes (Signed)
Subjective: Amy Livingston presents today preventative diabetic foot care and painful thick toenails that are difficult to trim. Pain interferes with ambulation. Aggravating factors include wearing enclosed shoe gear. Pain is relieved with periodic professional debridement.  Toma Deiters, MD is patient's PCP.   Past Medical History:  Diagnosis Date  . Anxiety   . Diabetes mellitus without complication (HCC)   . GERD (gastroesophageal reflux disease)   . Hypertension   . PONV (postoperative nausea and vomiting)   . Shortness of breath dyspnea      Patient Active Problem List   Diagnosis Date Noted  . Cervical spondylosis with myelopathy 01/13/2016    Current Outpatient Medications on File Prior to Visit  Medication Sig Dispense Refill  . ALPRAZolam (XANAX) 1 MG tablet Take 1 mg by mouth 2 (two) times daily as needed for anxiety.     . ASPIRIN LOW DOSE 81 MG EC tablet Take 81 mg by mouth daily.    Marland Kitchen atorvastatin (LIPITOR) 80 MG tablet Take 80 mg by mouth daily.    . benazepril (LOTENSIN) 20 MG tablet Take 20 mg by mouth daily.    . benazepril (LOTENSIN) 40 MG tablet Take 40 mg by mouth daily.    . cyclobenzaprine (FLEXERIL) 10 MG tablet Take 10 mg by mouth 3 (three) times daily as needed. For muscle spasms     . cyclobenzaprine (FLEXERIL) 10 MG tablet Take 1 tablet (10 mg total) by mouth 3 (three) times daily as needed for muscle spasms. 30 tablet 5  . fenofibrate (TRICOR) 145 MG tablet Take 145 mg by mouth daily.      Marland Kitchen gabapentin (NEURONTIN) 300 MG capsule Take 300 mg by mouth 3 (three) times daily.    . hydrochlorothiazide (HYDRODIURIL) 25 MG tablet Take 25 mg by mouth daily.      Marland Kitchen HYDROcodone-acetaminophen (LORTAB) 10-500 MG per tablet Take 1 tablet by mouth every 6 (six) hours as needed for pain.     Marland Kitchen ibuprofen (ADVIL,MOTRIN) 800 MG tablet Take 800 mg by mouth 2 (two) times daily as needed for moderate pain.     Marland Kitchen insulin detemir (LEVEMIR) 100 UNIT/ML injection Inject 50  Units into the skin at bedtime.    . insulin lispro protamine-lispro (HUMALOG 50/50 MIX) (50-50) 100 UNIT/ML SUSP injection Inject 40-60 Units into the skin 3 (three) times daily. 60 units in morning 40 units at noon 60 units in the evening    . linagliptin (TRADJENTA) 5 MG TABS tablet Take 5 mg by mouth daily.    Marland Kitchen LINZESS 145 MCG CAPS capsule Take 145 mcg by mouth daily.    Marland Kitchen lisinopril-hydrochlorothiazide (ZESTORETIC) 10-12.5 MG tablet Take 1 tablet by mouth daily.    . meclizine (ANTIVERT) 25 MG tablet Take 25 mg by mouth daily as needed for dizziness.     . metFORMIN (GLUCOPHAGE) 1000 MG tablet Take 1,000 mg by mouth 2 (two) times daily.      . metoprolol tartrate (LOPRESSOR) 50 MG tablet Take 50 mg by mouth 2 (two) times daily.    Marland Kitchen omeprazole (PRILOSEC) 40 MG capsule Take 40 mg by mouth daily.      Marland Kitchen oxyCODONE-acetaminophen (PERCOCET/ROXICET) 5-325 MG tablet Take 1-2 tablets by mouth every 4 (four) hours as needed for moderate pain. 40 tablet 0  . potassium chloride (KLOR-CON) 10 MEQ CR tablet Take 10 mEq by mouth daily.      . ramipril (ALTACE) 1.25 MG capsule Take 1.25 mg by mouth daily.      Marland Kitchen  RESTASIS 0.05 % ophthalmic emulsion 1 drop 2 (two) times daily.    . saxagliptin HCl (ONGLYZA) 2.5 MG TABS tablet Take 5 mg by mouth daily.      . sertraline (ZOLOFT) 50 MG tablet Take 50 mg by mouth daily.    . simvastatin (ZOCOR) 20 MG tablet Take 20 mg by mouth daily.      Marland Kitchen VITAMIN B1-B12 IM Inject 1 mg into the muscle every 30 (thirty) days.     Current Facility-Administered Medications on File Prior to Visit  Medication Dose Route Frequency Provider Last Rate Last Admin  . ondansetron (ZOFRAN) 4 mg in sodium chloride 0.9 % 50 mL IVPB  4 mg Intravenous Q6H PRN Barnett Abu, MD         Allergies  Allergen Reactions  . Sulfur Itching and Rash    Yeast infections    Objective: Amy Livingston is a pleasant 64 y.o. Caucasian female morbidly obese in NAD. AAO x 3.  There were no  vitals filed for this visit.  Vascular Examination: Neurovascular status unchanged b/l lower extremities. Capillary refill time to digits immediate b/l. Palpable pedal pulses b/l LE. Pedal hair sparse. Lower extremity skin temperature gradient within normal limits. No pain with calf compression b/l. No edema noted b/l lower extremities. No ischemia or gangrene noted b/l lower extremities.  Dermatological Examination: Pedal skin with normal turgor, texture and tone bilaterally. No open wounds bilaterally. No interdigital macerations bilaterally. Toenails 1-5 b/l elongated, discolored, dystrophic, thickened, crumbly with subungual debris and tenderness to dorsal palpation. Incurvated nailplate b/l great toes b/l borders. No erythema, no edema, no drainage noted.  Musculoskeletal: Normal muscle strength 5/5 to all lower extremity muscle groups bilaterally. No pain crepitus or joint limitation noted with ROM b/l. Hallux valgus with bunion deformity noted b/l lower extremities. Hammertoes noted to the 2-5 bilaterally.  Neurological Examination: Protective sensation intact 5/5 intact bilaterally with 10g monofilament b/l. Vibratory sensation intact b/l. Proprioception intact bilaterally. Clonus negative b/l.  Assessment: 1. Pain due to onychomycosis of toenails of both feet   2. Ingrown toenail without infection   3. Uncontrolled type 2 diabetes mellitus with hyperglycemia (HCC)    Plan: -Examined patient. -No new findings. No new orders. -Continue diabetic foot care principles. -Toenails 1-5 b/l were debrided in length and girth with sterile nail nippers and dremel without iatrogenic bleeding.  -Offending nail border debrided and curretaged L hallux and R hallux utilizing sterile nail nipper and currette. Border cleansed with alcohol and triple antibiotic applied. No further treatment required by patient/caregiver. -Patient to report any pedal injuries to medical professional  immediately. -Patient to continue soft, supportive shoe gear daily. -Patient/POA to call should there be question/concern in the interim.  Return in about 3 months (around 05/20/2020) for diabetic nail trim.  Freddie Breech, DPM

## 2020-04-21 ENCOUNTER — Other Ambulatory Visit (HOSPITAL_COMMUNITY): Payer: Self-pay | Admitting: Neurological Surgery

## 2020-04-21 ENCOUNTER — Other Ambulatory Visit: Payer: Self-pay | Admitting: Neurological Surgery

## 2020-04-21 DIAGNOSIS — M48061 Spinal stenosis, lumbar region without neurogenic claudication: Secondary | ICD-10-CM

## 2020-05-07 ENCOUNTER — Ambulatory Visit (HOSPITAL_COMMUNITY): Payer: Medicaid Other

## 2020-05-19 ENCOUNTER — Encounter (HOSPITAL_COMMUNITY): Payer: Self-pay

## 2020-05-19 ENCOUNTER — Ambulatory Visit (HOSPITAL_COMMUNITY): Payer: Medicaid Other

## 2020-07-01 DIAGNOSIS — E1165 Type 2 diabetes mellitus with hyperglycemia: Secondary | ICD-10-CM | POA: Insufficient documentation

## 2020-07-01 DIAGNOSIS — Z794 Long term (current) use of insulin: Secondary | ICD-10-CM | POA: Insufficient documentation

## 2020-07-01 DIAGNOSIS — E785 Hyperlipidemia, unspecified: Secondary | ICD-10-CM | POA: Insufficient documentation

## 2020-07-01 DIAGNOSIS — E119 Type 2 diabetes mellitus without complications: Secondary | ICD-10-CM | POA: Insufficient documentation

## 2020-08-14 DIAGNOSIS — I480 Paroxysmal atrial fibrillation: Secondary | ICD-10-CM | POA: Insufficient documentation

## 2020-08-28 ENCOUNTER — Other Ambulatory Visit: Payer: Self-pay

## 2020-08-28 ENCOUNTER — Other Ambulatory Visit (HOSPITAL_COMMUNITY)
Admission: RE | Admit: 2020-08-28 | Discharge: 2020-08-28 | Disposition: A | Discharge status: Home / Self Care | Payer: Medicaid Other | Source: Ambulatory Visit | Attending: Anesthesiology | Admitting: Anesthesiology

## 2020-08-28 DIAGNOSIS — E559 Vitamin D deficiency, unspecified: Secondary | ICD-10-CM | POA: Diagnosis present

## 2020-08-28 DIAGNOSIS — N951 Menopausal and female climacteric states: Secondary | ICD-10-CM | POA: Diagnosis not present

## 2020-08-28 DIAGNOSIS — R5383 Other fatigue: Secondary | ICD-10-CM | POA: Diagnosis present

## 2020-09-02 LAB — MISC LABCORP TEST (SEND OUT): Labcorp test code: 250133

## 2020-12-24 ENCOUNTER — Encounter (INDEPENDENT_AMBULATORY_CARE_PROVIDER_SITE_OTHER): Payer: Self-pay | Admitting: *Deleted

## 2021-03-17 ENCOUNTER — Encounter: Payer: Self-pay | Admitting: Radiology

## 2021-04-20 ENCOUNTER — Other Ambulatory Visit: Payer: Self-pay

## 2021-04-20 ENCOUNTER — Ambulatory Visit (INDEPENDENT_AMBULATORY_CARE_PROVIDER_SITE_OTHER): Payer: Medicaid Other | Admitting: Orthopedic Surgery

## 2021-04-20 ENCOUNTER — Encounter: Payer: Self-pay | Admitting: Orthopedic Surgery

## 2021-04-20 VITALS — BP 137/82 | HR 83 | Ht 66.0 in | Wt 254.0 lb

## 2021-04-20 DIAGNOSIS — M65341 Trigger finger, right ring finger: Secondary | ICD-10-CM

## 2021-04-20 NOTE — Progress Notes (Signed)
Chief Complaint  Patient presents with   Hand Pain    R/ ring finger/swollen some, hurting and catching.   65 year old female complains of catching and locking of the right ring finger she is right-hand dominant she denies any trauma no prior treatment symptoms for 2 to 3 months  Exam shows normal-appearing right hand with tenderness over the A1 pulley she demonstrates the catching and locking and pulls at 40 neurovascular exam is otherwise intact the patient is awake alert and oriented x3  Past Medical History:  Diagnosis Date   Anxiety    Diabetes mellitus without complication (HCC)    GERD (gastroesophageal reflux disease)    Hypertension    PONV (postoperative nausea and vomiting)    Shortness of breath dyspnea    Past Surgical History:  Procedure Laterality Date   ABDOMINAL HYSTERECTOMY     ANTERIOR CERVICAL DECOMP/DISCECTOMY FUSION N/A 01/13/2016   Procedure: C5-6 ANTERIOR CERVICAL DECOMPRESSION/DISKECTOMY/FUSION;  Surgeon: Barnett Abu, MD;  Location: [facility];  Service: Neurosurgery;  Laterality: N/A;  C5-6 ANTERIOR CERVICAL DECOMPRESSION/DISKECTOMY/FUSION   BACK SURGERY     x 5   CHOLECYSTECTOMY     KNEE ARTHROSCOPY     left   TUBAL LIGATION      Allergies  Allergen Reactions   Elemental Sulfur Itching and Rash    Yeast infections    Encounter Diagnosis  Name Primary?   Trigger finger, right ring finger Yes    A steroid injection was performed A1 pulley right ring finger using 1% plain Lidocaine and 6 mg of Celestone. This was well tolerated.

## 2021-04-20 NOTE — Patient Instructions (Signed)
Call if not better in 2 weeks  

## 2021-06-10 DIAGNOSIS — E139 Other specified diabetes mellitus without complications: Secondary | ICD-10-CM | POA: Insufficient documentation

## 2021-06-11 ENCOUNTER — Ambulatory Visit: Payer: Medicaid Other | Admitting: Orthopedic Surgery

## 2022-02-04 ENCOUNTER — Encounter (INDEPENDENT_AMBULATORY_CARE_PROVIDER_SITE_OTHER): Payer: Self-pay | Admitting: *Deleted

## 2022-02-18 LAB — HM MAMMOGRAPHY

## 2022-04-06 ENCOUNTER — Ambulatory Visit (INDEPENDENT_AMBULATORY_CARE_PROVIDER_SITE_OTHER): Payer: Medicare Other | Admitting: Gastroenterology

## 2022-06-15 ENCOUNTER — Encounter (INDEPENDENT_AMBULATORY_CARE_PROVIDER_SITE_OTHER): Payer: Self-pay | Admitting: *Deleted

## 2022-06-15 ENCOUNTER — Ambulatory Visit (INDEPENDENT_AMBULATORY_CARE_PROVIDER_SITE_OTHER): Payer: Medicare Other | Admitting: Gastroenterology

## 2022-06-15 ENCOUNTER — Encounter (INDEPENDENT_AMBULATORY_CARE_PROVIDER_SITE_OTHER): Payer: Self-pay | Admitting: Gastroenterology

## 2022-10-07 ENCOUNTER — Encounter: Payer: Self-pay | Admitting: Radiology

## 2022-11-10 ENCOUNTER — Ambulatory Visit: Payer: Medicare Other | Admitting: Family Medicine

## 2023-03-29 ENCOUNTER — Telehealth: Payer: Self-pay | Admitting: *Deleted

## 2023-03-29 NOTE — Progress Notes (Signed)
  Care Coordination Health Equity Plan  Outreach Note  03/29/2023 Name: Nateshia Hufnagle MRN: [id number] DOB: August 30, 1955   Care Coordination Outreach Attempts: An unsuccessful telephone outreach was attempted today to offer the patient information about available care coordination services.  Follow Up Plan:  Additional outreach attempts will be made to offer the patient care coordination information and services.   Encounter Outcome:  No Answer  Gwenevere Ghazi  Care Coordination Care Guide  Direct Dial: [phone]

## 2023-04-04 NOTE — Progress Notes (Unsigned)
  Care Coordination  Outreach Note  04/04/2023 Name: Amy Livingston MRN: [id number] DOB: September 05, 1955   Care Coordination Outreach Attempts: A second unsuccessful outreach was attempted today to offer the patient with information about available care coordination services.  Follow Up Plan:  Additional outreach attempts will be made to offer the patient care coordination information and services.   Encounter Outcome:  No Answer   Gwenevere Ghazi  Care Coordination Care Guide  Direct Dial: [phone]

## 2023-04-05 NOTE — Progress Notes (Signed)
  Care Coordination  Outreach Note  04/05/2023 Name: Amy Livingston MRN: [id number] DOB: November 03, 1955   Care Coordination Outreach Attempts: A third unsuccessful outreach was attempted today to offer the patient with information about available care coordination services.  Follow Up Plan:  No further outreach attempts will be made at this time. We have been unable to contact the patient to offer or enroll patient in care coordination services  Encounter Outcome:  No Answer  Gwenevere Ghazi  Care Coordination Care Guide  Direct Dial: [phone]

## 2023-06-01 ENCOUNTER — Ambulatory Visit (INDEPENDENT_AMBULATORY_CARE_PROVIDER_SITE_OTHER): Payer: Medicare Other | Admitting: Family Medicine

## 2023-06-01 ENCOUNTER — Encounter: Payer: Self-pay | Admitting: Family Medicine

## 2023-06-01 VITALS — BP 102/70 | HR 71 | Ht 66.0 in | Wt 195.0 lb

## 2023-06-01 DIAGNOSIS — Z1231 Encounter for screening mammogram for malignant neoplasm of breast: Secondary | ICD-10-CM

## 2023-06-01 DIAGNOSIS — Z114 Encounter for screening for human immunodeficiency virus [HIV]: Secondary | ICD-10-CM

## 2023-06-01 DIAGNOSIS — R350 Frequency of micturition: Secondary | ICD-10-CM

## 2023-06-01 DIAGNOSIS — Z1211 Encounter for screening for malignant neoplasm of colon: Secondary | ICD-10-CM

## 2023-06-01 DIAGNOSIS — I10 Essential (primary) hypertension: Secondary | ICD-10-CM

## 2023-06-01 DIAGNOSIS — R531 Weakness: Secondary | ICD-10-CM

## 2023-06-01 DIAGNOSIS — Z1159 Encounter for screening for other viral diseases: Secondary | ICD-10-CM

## 2023-06-01 DIAGNOSIS — R32 Unspecified urinary incontinence: Secondary | ICD-10-CM | POA: Insufficient documentation

## 2023-06-01 DIAGNOSIS — E139 Other specified diabetes mellitus without complications: Secondary | ICD-10-CM

## 2023-06-01 DIAGNOSIS — Z23 Encounter for immunization: Secondary | ICD-10-CM | POA: Diagnosis not present

## 2023-06-01 DIAGNOSIS — R159 Full incontinence of feces: Secondary | ICD-10-CM

## 2023-06-01 DIAGNOSIS — E559 Vitamin D deficiency, unspecified: Secondary | ICD-10-CM

## 2023-06-01 DIAGNOSIS — R152 Fecal urgency: Secondary | ICD-10-CM

## 2023-06-01 DIAGNOSIS — E038 Other specified hypothyroidism: Secondary | ICD-10-CM

## 2023-06-01 DIAGNOSIS — E7849 Other hyperlipidemia: Secondary | ICD-10-CM

## 2023-06-01 MED ORDER — NITROFURANTOIN MONOHYD MACRO 100 MG PO CAPS: 100.0000 mg | ORAL_CAPSULE | Freq: Two times a day (BID) | ORAL | 0 refills | Status: AC

## 2023-06-01 NOTE — Assessment & Plan Note (Signed)
The patient is encouraged to continue taking Imodium as needed. She is also advised to increase her fluid intake and incorporate more fiber into her diet. Additionally, the FODMAP diet was recommended to help manage her symptoms. Please avoid foods that are  Fermentable Oligosaccharides: Wheat, barley, rye, onion, leek, white part of spring onion, garlic, shallots, artichokes, beetroot, fennel, peas, chicory, pistachio, cashews, legumes, lentils, and chickpeas Disaccharides:Lactose, Milk, custard, ice cream, and yogurt Monosaccharides: Apples, pears, mangoes, cherries, watermelon, asparagus, sugar snap peas, honey, high-fructose corn syrup Polysols:Apples, pears, apricots, cherries, nectarines, peaches, plums, watermelon, mushrooms, cauliflower, artificially sweetened chewing gum and confectionery.

## 2023-06-01 NOTE — Progress Notes (Signed)
New Patient Office Visit  Subjective:  Patient ID: Amy Livingston, female    DOB: 03-Apr-1956  Age: 67 y.o. MRN: [id number]  CC:  Chief Complaint  Patient presents with   New Patient (Initial Visit)    Establishing care. Needs to discuss diabetes, low blood pressure, and cyst on knee. Would like to discuss gabapentin strength. Has incontinence with both urine and stools.     HPI Amy Livingston is a 67 y.o. female with past medical history of type 2 diabetes, A-fib, dyslipidemia, neck pain presents for establishing care.  Hypertension:The patient reports low blood pressure readings in the 100s/70s while ambulatory. She is currently taking metoprolol 50 mg daily and occasionally experiences headaches and dizziness. It is noted that the patient has a history of vertigo.  Urinary Frequency:The patient reports a long history of urinary incontinence, with leakage during increased activity and movement. She denies nocturia but reports dysuria and urgency.  Bowel Incontinence:The patient has a history of IBS, for which she was treated 20 years ago. She reports mucus in her stool but denies the presence of blood. Fecal incontinence is generally well-controlled with over-the-counter Imodium. She denies fever or chills.  Generalized Weakness:The patient admits to impaired balance and wobbling while ambulating. She reports generalized weakness and fatigue and is requesting a referral to physical therapy for strengthening exercises.   Past Medical History:  Diagnosis Date   Anxiety    Diabetes mellitus without complication (HCC)    GERD (gastroesophageal reflux disease)    Hypertension    PONV (postoperative nausea and vomiting)    Shortness of breath dyspnea     Past Surgical History:  Procedure Laterality Date   ABDOMINAL HYSTERECTOMY     ANTERIOR CERVICAL DECOMP/DISCECTOMY FUSION N/A 01/13/2016   Procedure: C5-6 ANTERIOR CERVICAL DECOMPRESSION/DISKECTOMY/FUSION;  Surgeon: Barnett Abu, MD;   Location: [facility];  Service: Neurosurgery;  Laterality: N/A;  C5-6 ANTERIOR CERVICAL DECOMPRESSION/DISKECTOMY/FUSION   BACK SURGERY     x 5   CHOLECYSTECTOMY     KNEE ARTHROSCOPY     left   TUBAL LIGATION      History reviewed. No pertinent family history.  Social History   Socioeconomic History   Marital status: Divorced    Spouse name: Not on file   Number of children: Not on file   Years of education: Not on file   Highest education level: Some college, no degree  Occupational History   Not on file  Tobacco Use   Smoking status: Never   Smokeless tobacco: Never  Substance and Sexual Activity   Alcohol use: No   Drug use: No   Sexual activity: Not on file  Other Topics Concern   Not on file  Social History Narrative   Not on file   Social Determinants of Health   Financial Resource Strain: Low Risk  (05/29/2023)   Overall Financial Resource Strain (CARDIA)    Difficulty of Paying Living Expenses: Not very hard  Food Insecurity: Food Insecurity Present (05/29/2023)   Hunger Vital Sign    Worried About Running Out of Food in the Last Year: Sometimes true    Ran Out of Food in the Last Year: Never true  Transportation Needs: No Transportation Needs (05/29/2023)   PRAPARE - Administrator, Civil Service (Medical): No    Lack of Transportation (Non-Medical): No  Physical Activity: Unknown (05/29/2023)   Exercise Vital Sign    Days of Exercise per Week: 0 days    Minutes  of Exercise per Session: Not on file  Stress: Stress Concern Present (05/29/2023)   [employer] of Occupational Health - Occupational Stress Questionnaire    Feeling of Stress : Very much  Social Connections: Socially Isolated (05/29/2023)   Social Connection and Isolation Panel [NHANES]    Frequency of Communication with Friends and Family: More than three times a week    Frequency of Social Gatherings with Friends and Family: Never    Attends Religious Services: Never     Database administrator or Organizations: No    Attends Engineer, structural: Not on file    Marital Status: Divorced  Intimate Partner Violence: Not At Risk (02/25/2022)   Received from [facility], [facility]   Humiliation, Afraid, Rape, and Kick questionnaire    Fear of Current or Ex-Partner: No    Emotionally Abused: No    Physically Abused: No    Sexually Abused: No    ROS Review of Systems  Constitutional:  Negative for chills and fever.  Eyes:  Negative for visual disturbance.  Respiratory:  Negative for chest tightness and shortness of breath.   Gastrointestinal:  Positive for diarrhea.  Genitourinary:  Positive for frequency and urgency.  Neurological:  Positive for weakness. Negative for dizziness and headaches.    Objective:   Today's Vitals: BP 102/70   Pulse 71   Ht 5\' 6"  (1.676 m)   Wt 195 lb (88.5 kg)   SpO2 93%   BMI 31.47 kg/m   Physical Exam HENT:     Head: Normocephalic.     Mouth/Throat:     Mouth: Mucous membranes are moist.  Cardiovascular:     Rate and Rhythm: Normal rate.     Heart sounds: Normal heart sounds.  Pulmonary:     Effort: Pulmonary effort is normal.     Breath sounds: Normal breath sounds.  Abdominal:     Tenderness: There is no abdominal tenderness. There is no right CVA tenderness or left CVA tenderness.  Neurological:     Mental Status: She is alert.     Motor: Weakness and pronator drift present.     Coordination: Romberg sign positive.     Gait: Gait abnormal.      Assessment & Plan:   Urinary frequency Assessment & Plan: The UA is positive for leukocytes and nitrites. The patient will be treated today with Macrobid due to her allergy to sulfa antibiotics. A sample of Myrbetriq will be provided to help manage urinary incontinence. Preventive measures for UTIs were reviewed, and the patient is encouraged to complete the full course of antibiotics as prescribed.   To help prevent future urinary tract  infections (UTIs), consider the following practices:  Avoid Prolonged Urination: Do not hold urine for extended periods, as this can stretch the bladder and create an environment conducive to bacterial growth. Prompt Bladder Emptying: Empty your bladder as soon as you feel the urge. Post-Intercourse Hygiene: Empty your bladder soon after intercourse to reduce the risk of infection. Shower Instead of Bath: Opt for showers rather than baths to minimize bacterial exposure. Proper Wiping Technique: Wipe from front to back after urinating and bowel movements to prevent the transfer of bacteria from the anal region to the vagina and urethra. Hydration: Drink a full glass of water regularly to help flush bacteria from your system.    Orders: -     Urinalysis -     Nitrofurantoin Monohyd Macro; Take 1 capsule (100 mg total)  by mouth 2 (two) times daily for 5 days.  Dispense: 10 capsule; Refill: 0  Primary hypertension Assessment & Plan: Controlled Hypertension:The patient is informed to notify me if her blood pressure falls below 90/60, especially if she experiences symptoms such as headaches, lightheadedness, or dizziness. She is encouraged to ensure she is drinking an adequate amount of fluids, at least 64 ounces daily, and to change positions slowly to avoid dizziness. The patient is advised to continue taking metoprolol 50 mg daily as prescribed.     Incontinence of feces with fecal urgency Assessment & Plan: The patient is encouraged to continue taking Imodium as needed. She is also advised to increase her fluid intake and incorporate more fiber into her diet. Additionally, the FODMAP diet was recommended to help manage her symptoms. Please avoid foods that are  Fermentable Oligosaccharides: Wheat, barley, rye, onion, leek, white part of spring onion, garlic, shallots, artichokes, beetroot, fennel, peas, chicory, pistachio, cashews, legumes, lentils, and chickpeas Disaccharides:Lactose, Milk,  custard, ice cream, and yogurt Monosaccharides: Apples, pears, mangoes, cherries, watermelon, asparagus, sugar snap peas, honey, high-fructose corn syrup Polysols:Apples, pears, apricots, cherries, nectarines, peaches, plums, watermelon, mushrooms, cauliflower, artificially sweetened chewing gum and confectionery.   Orders: -     Ambulatory referral to Gastroenterology  Generalized weakness Assessment & Plan: A referral has been placed to physical therapy for strengthening exercises to help manage generalized weakness. The patient is also encouraged to maintain a well-balanced diet.   Orders: -     Ambulatory referral to Physical Therapy  Encounter for immunization Assessment & Plan: Patient educated on CDC recommendation for the vaccine. Verbal consent was obtained from the patient, vaccine administered by nurse, no sign of adverse reactions noted at this time. Patient education on arm soreness and use of tylenol or ibuprofen for this patient  was discussed. Patient educated on the signs and symptoms of adverse effect and advise to contact the office if they occur.   Orders: -     Flu Vaccine Trivalent High Dose (Fluad) -     Pneumococcal conjugate vaccine 20-valent  Diabetes 1.5, managed as type 2 (HCC) -     Microalbumin / creatinine urine ratio -     Hemoglobin A1c  Colon cancer screening -     Ambulatory referral to Gastroenterology  Breast cancer screening by mammogram -     3D Screening Mammogram, Left and Right  Vitamin D deficiency -     VITAMIN D 25 Hydroxy (Vit-D Deficiency, Fractures)  Need for hepatitis C screening test -     Hepatitis C antibody  Encounter for screening for HIV  TSH (thyroid-stimulating hormone deficiency) -     TSH + free T4  Other hyperlipidemia -     Lipid panel -     CMP14+EGFR -     CBC with Differential/Platelet    Note: This chart has been completed using Engineer, civil (consulting) software, and while attempts have been made to  ensure accuracy, certain words and phrases may not be transcribed as intended.   Follow-up: Return in about 2 weeks (around 06/15/2023).   Gilmore Laroche, FNP

## 2023-06-01 NOTE — Assessment & Plan Note (Signed)
A referral has been placed to physical therapy for strengthening exercises to help manage generalized weakness. The patient is also encouraged to maintain a well-balanced diet.

## 2023-06-01 NOTE — Patient Instructions (Addendum)
I appreciate the opportunity to provide care to you today!    Follow up:   2 weeks  Labs: please stop by the lab today/during the week to get your blood drawn (CBC, CMP, TSH, Lipid profile, HgA1c, Vit D)  Screening: Hep C  Please schedule diabetic eye exam   Please schedule medicare annual wellness  Please schedule mammogram  Overactive Bladder: A sample of Myrbetriq 25 mg daily will be provided. Please verify with your insurance if this medication is covered. Continue performing Kegel exercises regularly. Low Blood Pressure (BP): Low blood pressure is considered less than 90/60 mmHg. Check your BP daily and bring a log with you to your next appointment. If your BP drops below 90/60 and you experience symptoms such as lightheadedness, blurred vision, or dizziness, please follow up in the ED. Weakness: A referral to physical therapy (PT) has been placed. I recommend performing stretching and strengthening exercises to help improve your condition.   Please stop by your local pharmacy and get your Tdap and Shingles vaccine  Referrals today-  physical Therapy, GI  Attached with your AVS, you will find valuable resources for self-education. I highly recommend dedicating some time to thoroughly examine them.   Please continue to a heart-healthy diet and increase your physical activities. Try to exercise for at least five days a week.    It was a pleasure to see you and I look forward to continuing to work together on your health and well-being. Please do not hesitate to call the office if you need care or have questions about your care.  In case of emergency, please visit the Emergency Department for urgent care, or contact our clinic at [phone] to schedule an appointment. We're here to help you!   Have a wonderful day and week. With Gratitude, Gilmore Laroche MSN, FNP-BC

## 2023-06-01 NOTE — Assessment & Plan Note (Signed)
Controlled Hypertension:The patient is informed to notify me if her blood pressure falls below 90/60, especially if she experiences symptoms such as headaches, lightheadedness, or dizziness. She is encouraged to ensure she is drinking an adequate amount of fluids, at least 64 ounces daily, and to change positions slowly to avoid dizziness. The patient is advised to continue taking metoprolol 50 mg daily as prescribed.

## 2023-06-01 NOTE — Assessment & Plan Note (Addendum)
The UA is positive for leukocytes and nitrites. The patient will be treated today with Macrobid due to her allergy to sulfa antibiotics. A sample of Myrbetriq will be provided to help manage urinary incontinence. Preventive measures for UTIs were reviewed, and the patient is encouraged to complete the full course of antibiotics as prescribed.   To help prevent future urinary tract infections (UTIs), consider the following practices:  Avoid Prolonged Urination: Do not hold urine for extended periods, as this can stretch the bladder and create an environment conducive to bacterial growth. Prompt Bladder Emptying: Empty your bladder as soon as you feel the urge. Post-Intercourse Hygiene: Empty your bladder soon after intercourse to reduce the risk of infection. Shower Instead of Bath: Opt for showers rather than baths to minimize bacterial exposure. Proper Wiping Technique: Wipe from front to back after urinating and bowel movements to prevent the transfer of bacteria from the anal region to the vagina and urethra. Hydration: Drink a full glass of water regularly to help flush bacteria from your system.

## 2023-06-01 NOTE — Assessment & Plan Note (Signed)
Patient educated on CDC recommendation for the vaccine. Verbal consent was obtained from the patient, vaccine administered by nurse, no sign of adverse reactions noted at this time. Patient education on arm soreness and use of tylenol or ibuprofen for this patient  was discussed. Patient educated on the signs and symptoms of adverse effect and advise to contact the office if they occur.  

## 2023-06-02 ENCOUNTER — Other Ambulatory Visit: Payer: Self-pay | Admitting: Family Medicine

## 2023-06-02 ENCOUNTER — Telehealth: Payer: Self-pay | Admitting: Family Medicine

## 2023-06-02 LAB — URINALYSIS
Bilirubin, UA: NEGATIVE
Ketones, UA: NEGATIVE
Nitrite, UA: POSITIVE — AB
Protein,UA: NEGATIVE
Specific Gravity, UA: 1.015 (ref 1.005–1.030)
Urobilinogen, Ur: 0.2 mg/dL (ref 0.2–1.0)
pH, UA: 5.5 (ref 5.0–7.5)

## 2023-06-02 NOTE — Telephone Encounter (Signed)
[facility] calling back on critical glucose of 504 on patient. Resulted at 11:44 am today. Unsure if Malachi Bonds has seen these results and she is out of the office this pm. Please advise

## 2023-06-02 NOTE — Telephone Encounter (Signed)
[facility] called in for critical labs on patient   Call back # [phone] opt 1   Reference # [id number]

## 2023-06-02 NOTE — Telephone Encounter (Signed)
Called patient let patient Voice mail  message to visit ED

## 2023-06-03 ENCOUNTER — Other Ambulatory Visit: Payer: Self-pay | Admitting: Family Medicine

## 2023-06-03 ENCOUNTER — Other Ambulatory Visit: Payer: Self-pay

## 2023-06-03 ENCOUNTER — Emergency Department (HOSPITAL_COMMUNITY)
Admission: EM | Admit: 2023-06-03 | Discharge: 2023-06-03 | Disposition: A | Discharge status: Home / Self Care | Payer: Medicare Other | Attending: Emergency Medicine | Admitting: Emergency Medicine

## 2023-06-03 ENCOUNTER — Telehealth: Payer: Self-pay | Admitting: Family Medicine

## 2023-06-03 ENCOUNTER — Encounter (HOSPITAL_COMMUNITY): Payer: Self-pay

## 2023-06-03 ENCOUNTER — Ambulatory Visit: Payer: Self-pay

## 2023-06-03 DIAGNOSIS — R739 Hyperglycemia, unspecified: Secondary | ICD-10-CM

## 2023-06-03 DIAGNOSIS — E1165 Type 2 diabetes mellitus with hyperglycemia: Secondary | ICD-10-CM | POA: Insufficient documentation

## 2023-06-03 DIAGNOSIS — Z7984 Long term (current) use of oral hypoglycemic drugs: Secondary | ICD-10-CM | POA: Diagnosis not present

## 2023-06-03 DIAGNOSIS — Z794 Long term (current) use of insulin: Secondary | ICD-10-CM | POA: Diagnosis not present

## 2023-06-03 DIAGNOSIS — Z7901 Long term (current) use of anticoagulants: Secondary | ICD-10-CM | POA: Insufficient documentation

## 2023-06-03 LAB — CBC
HCT: 40.7 % (ref 36.0–46.0)
Hemoglobin: 13.2 g/dL (ref 12.0–15.0)
MCH: 29.9 pg (ref 26.0–34.0)
MCHC: 32.4 g/dL (ref 30.0–36.0)
MCV: 92.3 fL (ref 80.0–100.0)
Platelets: 172 10*3/uL (ref 150–400)
RBC: 4.41 MIL/uL (ref 3.87–5.11)
RDW: 12.7 % (ref 11.5–15.5)
WBC: 5.6 10*3/uL (ref 4.0–10.5)
nRBC: 0 % (ref 0.0–0.2)

## 2023-06-03 LAB — CBC WITH DIFFERENTIAL/PLATELET
Basophils Absolute: 0.1 10*3/uL (ref 0.0–0.2)
Basos: 1 %
EOS (ABSOLUTE): 0.3 10*3/uL (ref 0.0–0.4)
Eos: 4 %
Hematocrit: 43.6 % (ref 34.0–46.6)
Hemoglobin: 13.7 g/dL (ref 11.1–15.9)
Immature Grans (Abs): 0 10*3/uL (ref 0.0–0.1)
Immature Granulocytes: 0 %
Lymphocytes Absolute: 2.9 10*3/uL (ref 0.7–3.1)
Lymphs: 34 %
MCH: 30.5 pg (ref 26.6–33.0)
MCHC: 31.4 g/dL — ABNORMAL LOW (ref 31.5–35.7)
MCV: 97 fL (ref 79–97)
Monocytes Absolute: 0.6 10*3/uL (ref 0.1–0.9)
Monocytes: 7 %
Neutrophils Absolute: 4.7 10*3/uL (ref 1.4–7.0)
Neutrophils: 54 %
Platelets: 193 10*3/uL (ref 150–450)
RBC: 4.49 x10E6/uL (ref 3.77–5.28)
RDW: 11.9 % (ref 11.7–15.4)
WBC: 8.5 10*3/uL (ref 3.4–10.8)

## 2023-06-03 LAB — COMPREHENSIVE METABOLIC PANEL
ALT: 17 U/L (ref 0–44)
AST: 16 U/L (ref 15–41)
Albumin: 3.5 g/dL (ref 3.5–5.0)
Alkaline Phosphatase: 137 U/L — ABNORMAL HIGH (ref 38–126)
Anion gap: 9 (ref 5–15)
BUN: 11 mg/dL (ref 8–23)
CO2: 27 mmol/L (ref 22–32)
Calcium: 9.2 mg/dL (ref 8.9–10.3)
Chloride: 99 mmol/L (ref 98–111)
Creatinine, Ser: 0.63 mg/dL (ref 0.44–1.00)
GFR, Estimated: >60 mL/min (ref >60)
Glucose, Bld: 506 mg/dL (ref 70–99)
Potassium: 4.3 mmol/L (ref 3.5–5.1)
Sodium: 135 mmol/L (ref 135–145)
Total Bilirubin: 0.6 mg/dL (ref 0.3–1.2)
Total Protein: 6.4 g/dL — ABNORMAL LOW (ref 6.5–8.1)

## 2023-06-03 LAB — URINALYSIS, ROUTINE W REFLEX MICROSCOPIC
Bacteria, UA: NONE SEEN
Bilirubin Urine: NEGATIVE
Glucose, UA: >=500 mg/dL — AB
Ketones, ur: NEGATIVE mg/dL
Nitrite: NEGATIVE
Protein, ur: NEGATIVE mg/dL
Specific Gravity, Urine: 1.021 (ref 1.005–1.030)
WBC, UA: >50 WBC/hpf (ref 0–5)
pH: 6 (ref 5.0–8.0)

## 2023-06-03 LAB — TSH+FREE T4
Free T4: 1.37 ng/dL (ref 0.82–1.77)
TSH: 0.634 u[IU]/mL (ref 0.450–4.500)

## 2023-06-03 LAB — BLOOD GAS, VENOUS
Acid-Base Excess: 3.4 mmol/L — ABNORMAL HIGH (ref 0.0–2.0)
Bicarbonate: 29.6 mmol/L — ABNORMAL HIGH (ref 20.0–28.0)
Drawn by: 27160
O2 Saturation: 16 %
Patient temperature: 36.8
pCO2, Ven: 50 mm[Hg] (ref 44–60)
pH, Ven: 7.38 (ref 7.25–7.43)
pO2, Ven: <31 mm[Hg] — CL (ref 32–45)

## 2023-06-03 LAB — HEMOGLOBIN A1C
Est. average glucose Bld gHb Est-mCnc: >398 mg/dL
Hgb A1c MFr Bld: >15.5 % — ABNORMAL HIGH (ref 4.8–5.6)

## 2023-06-03 LAB — CMP14+EGFR
ALT: 15 [IU]/L (ref 0–32)
AST: 15 [IU]/L (ref 0–40)
Albumin: 4 g/dL (ref 3.9–4.9)
Alkaline Phosphatase: 125 [IU]/L — ABNORMAL HIGH (ref 44–121)
BUN/Creatinine Ratio: 18 (ref 12–28)
BUN: 14 mg/dL (ref 8–27)
Bilirubin Total: 0.5 mg/dL (ref 0.0–1.2)
CO2: 24 mmol/L (ref 20–29)
Calcium: 9.4 mg/dL (ref 8.7–10.3)
Chloride: 98 mmol/L (ref 96–106)
Creatinine, Ser: 0.76 mg/dL (ref 0.57–1.00)
Globulin, Total: 2 g/dL (ref 1.5–4.5)
Glucose: 504 mg/dL (ref 70–99)
Potassium: 4.7 mmol/L (ref 3.5–5.2)
Sodium: 138 mmol/L (ref 134–144)
Total Protein: 6 g/dL (ref 6.0–8.5)
eGFR: 86 mL/min/{1.73_m2} (ref >59)

## 2023-06-03 LAB — MICROALBUMIN / CREATININE URINE RATIO
Creatinine, Urine: 25.9 mg/dL
Microalb/Creat Ratio: 117 mg/g{creat} — ABNORMAL HIGH (ref 0–29)
Microalbumin, Urine: 30.2 ug/mL

## 2023-06-03 LAB — LIPID PANEL
Chol/HDL Ratio: 2.7 ratio (ref 0.0–4.4)
Cholesterol, Total: 130 mg/dL (ref 100–199)
HDL: 49 mg/dL (ref >39)
LDL Chol Calc (NIH): 57 mg/dL (ref 0–99)
Triglycerides: 138 mg/dL (ref 0–149)
VLDL Cholesterol Cal: 24 mg/dL (ref 5–40)

## 2023-06-03 LAB — CBG MONITORING, ED
Glucose-Capillary: 504 mg/dL (ref 70–99)
Glucose-Capillary: 488 mg/dL — ABNORMAL HIGH (ref 70–99)
Glucose-Capillary: 341 mg/dL — ABNORMAL HIGH (ref 70–99)

## 2023-06-03 LAB — VITAMIN D 25 HYDROXY (VIT D DEFICIENCY, FRACTURES): Vit D, 25-Hydroxy: 32.3 ng/mL (ref 30.0–100.0)

## 2023-06-03 LAB — BETA-HYDROXYBUTYRIC ACID: Beta-Hydroxybutyric Acid: 0.73 mmol/L — ABNORMAL HIGH (ref 0.05–0.27)

## 2023-06-03 LAB — HEPATITIS C ANTIBODY: Hep C Virus Ab: NONREACTIVE

## 2023-06-03 MED ORDER — LEVEMIR FLEXPEN 100 UNIT/ML ~~LOC~~ SOPN: 10.0000 [IU] | PEN_INJECTOR | Freq: Every day | SUBCUTANEOUS | 11 refills | Status: DC

## 2023-06-03 MED ORDER — INSULIN ASPART 100 UNIT/ML IJ SOLN
15.0000 [IU] | INTRAMUSCULAR | Status: AC
  Administered 2023-06-03: 15 [IU] via SUBCUTANEOUS
  Filled 2023-06-03: qty 1

## 2023-06-03 MED ORDER — METFORMIN HCL 1000 MG PO TABS: 1000.0000 mg | ORAL_TABLET | Freq: Two times a day (BID) | ORAL | 3 refills | Status: DC

## 2023-06-03 MED ORDER — LANCET DEVICE MISC: 1.0000 | Freq: Three times a day (TID) | 0 refills | Status: DC

## 2023-06-03 MED ORDER — BLOOD GLUCOSE MONITORING SUPPL DEVI: 1.0000 | Freq: Three times a day (TID) | 0 refills | Status: DC

## 2023-06-03 MED ORDER — LANCETS MISC. MISC: 1.0000 | Freq: Three times a day (TID) | 0 refills | Status: DC

## 2023-06-03 MED ORDER — TIRZEPATIDE 2.5 MG/0.5ML ~~LOC~~ SOAJ: 2.5000 mg | SUBCUTANEOUS | 0 refills | Status: DC

## 2023-06-03 MED ORDER — BLOOD GLUCOSE TEST VI STRP: 1.0000 | ORAL_STRIP | Freq: Three times a day (TID) | 0 refills | Status: DC

## 2023-06-03 MED ORDER — INSULIN LISPRO 100 UNIT/ML IJ SOLN: 4.0000 [IU] | Freq: Two times a day (BID) | INTRAMUSCULAR | 2 refills | Status: DC

## 2023-06-03 NOTE — Telephone Encounter (Signed)
The officer called and informed me that he stopped by the patient's apartment and confirmed that she is okay. He encouraged the patient to go to the ED to receive urgent care.

## 2023-06-03 NOTE — ED Notes (Signed)
BGL in triage 504 Urine cup given to pt

## 2023-06-03 NOTE — ED Provider Triage Note (Signed)
Emergency Medicine Provider Triage Evaluation Note  Amy Livingston , a 67 y.o. female  was evaluated in triage.  Pt complains of high blood sugar, nausea, headache in the past few days.  Patient is not on any antidiabetic medication.  Denies any fevers.  She does have some lower abdominal pain.  Denies any bowel changes, urinary symptoms.  Review of Systems  Positive: As above Negative: As above  Physical Exam  BP 133/88   Pulse 63   Temp 98.2 F (36.8 C) (Oral)   Resp 16   Ht 5\' 6"  (1.676 m)   Wt 88.5 kg   SpO2 98%   BMI 31.47 kg/m  Gen:   Awake, no distress   Resp:  Normal effort  MSK:   Moves extremities without difficulty  Other:    Medical Decision Making  Medically screening exam initiated at 4:03 PM.  Appropriate orders placed.  Alexei Hetzel was informed that the remainder of the evaluation will be completed by another provider, this initial triage assessment does not replace that evaluation, and the importance of remaining in the ED until their evaluation is complete.    Jeanelle Malling, [place] 06/03/23 [id number]

## 2023-06-03 NOTE — Discharge Instructions (Addendum)
You were seen for your elevated blood sugar in the emergency department.   At home, please take the Humalog twice daily before lunch and dinner.  Take the levamir before bed.  Check your MyChart online for the results of any tests that had not resulted by the time you left the emergency department.   Follow-up with your primary doctor in 2-3 days regarding your visit to see if your Humalog dose needs to be increased.    Return immediately to the emergency department if you experience any of the following: Shortness of breath, blood sugars over 500 consistently, or any other concerning symptoms.    Thank you for visiting our Emergency Department. It was a pleasure taking care of you today.

## 2023-06-03 NOTE — ED Triage Notes (Signed)
Sent from PCP due to BGL reading "HIGH"  Pt complains of HA, dizziness and blurred vision. Increased thirst and urination

## 2023-06-03 NOTE — Telephone Encounter (Signed)
FYI pt headed to ER

## 2023-06-03 NOTE — Telephone Encounter (Signed)
I contacted [facility] and kindly requested a home visit to check on the patient's wellbeing and advise her to go to the ED, as I've been trying to reach her without success. The team assured me they would do a home visit and follow up with a call to update me on the situation.

## 2023-06-03 NOTE — ED Provider Notes (Signed)
[place] EMERGENCY DEPARTMENT AT [facility] Provider Note   CSN: [id number] Arrival date & time: 06/03/23  1325     History {Add pertinent medical, surgical, social history, OB history to HPI:1} Chief Complaint  Patient presents with   Hyperglycemia    Amy Livingston is a 67 y.o. female.  67 year old female with a history of diabetes who presents to the emergency department with elevated blood sugar.  Patient reports that she has been off of her insulin for the past 2 years.  Does not currently take any oral medication for diabetes either.  Had labs checked by her primary doctor this week that showed a severely elevated blood sugar so she was referred to the emergency department for evaluation.  Believes that she was previously on insulin lispro protamine and lispro 50-50 60 units in the morning and 40 units at noon and 60 units in the evening.  Says that she has dizziness but this has been present for very long period of time and is due to her vertigo no changes recently.  Denies any shortness of breath.       Home Medications Prior to Admission medications   Medication Sig Start Date End Date Taking? Authorizing Provider  atorvastatin (LIPITOR) 80 MG tablet Take 80 mg by mouth daily.    [provider]  Blood Glucose Monitoring Suppl DEVI 1 each by Does not apply route in the morning, at noon, and at bedtime. May substitute to any manufacturer covered by patient's insurance. 06/03/23   Gilmore Laroche, FNP  Cholecalciferol (VITAMIN D) 50 MCG (2000 UT) CAPS Take by mouth.    [provider]  cyclobenzaprine (FLEXERIL) 10 MG tablet Take 1 tablet (10 mg total) by mouth 3 (three) times daily as needed for muscle spasms. 01/14/16   Barnett Abu, MD  ELIQUIS 5 MG TABS tablet Take 5 mg by mouth 2 (two) times daily.    [provider]  gabapentin (NEURONTIN) 300 MG capsule Take 300 mg by mouth 3 (three) times daily.    [provider]   Glucose Blood (BLOOD GLUCOSE TEST STRIPS) STRP 1 each by In Vitro route in the morning, at noon, and at bedtime. May substitute to any manufacturer covered by patient's insurance. 06/03/23 07/03/23  Gilmore Laroche, FNP  Lancet Device MISC 1 each by Does not apply route in the morning, at noon, and at bedtime. May substitute to any manufacturer covered by patient's insurance. 06/03/23 07/03/23  Gilmore Laroche, FNP  Lancets Misc. MISC 1 each by Does not apply route in the morning, at noon, and at bedtime. May substitute to any manufacturer covered by patient's insurance. 06/03/23 07/03/23  Gilmore Laroche, FNP  meclizine (ANTIVERT) 25 MG tablet Take 25 mg by mouth daily as needed for dizziness.     [provider]  metFORMIN (GLUCOPHAGE) 1000 MG tablet Take 1 tablet (1,000 mg total) by mouth 2 (two) times daily with a meal. 06/03/23   Gilmore Laroche, FNP  metoprolol tartrate (LOPRESSOR) 50 MG tablet Take 50 mg by mouth 2 (two) times daily. 01/21/20   [provider]  nitrofurantoin, macrocrystal-monohydrate, (MACROBID) 100 MG capsule Take 1 capsule (100 mg total) by mouth 2 (two) times daily for 5 days. 06/01/23 06/06/23  Gilmore Laroche, FNP  omeprazole (PRILOSEC) 40 MG capsule Take 40 mg by mouth daily.      [provider]  oxyCODONE-acetaminophen (PERCOCET/ROXICET) 5-325 MG tablet Take 1-2 tablets by mouth every 4 (four) hours as needed for moderate pain.  Patient taking differently: Take 1-2 tablets by mouth every 8 (eight) hours as needed for moderate pain (pain score 4-6). 01/14/16   Barnett Abu, MD  RESTASIS 0.05 % ophthalmic emulsion 1 drop 2 (two) times daily. 02/11/20   [provider]  sertraline (ZOLOFT) 50 MG tablet Take 50 mg by mouth daily. 02/15/20   [provider]  tirzepatide Greggory Keen) 2.5 MG/0.5ML Pen Inject 2.5 mg into the skin once a week. 06/03/23   Gilmore Laroche, FNP      Allergies    Elemental sulfur    Review of Systems    Review of Systems  Physical Exam Updated Vital Signs BP 133/88   Pulse 63   Temp 98.2 F (36.8 C) (Oral)   Resp 16   Ht 5\' 6"  (1.676 m)   Wt 88.5 kg   SpO2 98%   BMI 31.47 kg/m  Physical Exam Vitals and nursing note reviewed.  Constitutional:      General: She is not in acute distress.    Appearance: She is well-developed.  HENT:     Head: Normocephalic and atraumatic.     Right Ear: External ear normal.     Left Ear: External ear normal.     Nose: Nose normal.  Eyes:     Extraocular Movements: Extraocular movements intact.     Conjunctiva/sclera: Conjunctivae normal.     Pupils: Pupils are equal, round, and reactive to light.  Cardiovascular:     Rate and Rhythm: Normal rate and regular rhythm.  Pulmonary:     Effort: Pulmonary effort is normal. No respiratory distress.  Musculoskeletal:     Cervical back: Normal range of motion and neck supple.  Skin:    General: Skin is warm and dry.  Neurological:     Mental Status: She is alert and oriented to person, place, and time. Mental status is at baseline.  Psychiatric:        Mood and Affect: Mood normal.     ED Results / Procedures / Treatments   Labs (all labs ordered are listed, but only abnormal results are displayed) Labs Reviewed  URINALYSIS, ROUTINE W REFLEX MICROSCOPIC - Abnormal; Notable for the following components:      Result Value   Color, Urine STRAW (*)    Glucose, UA >=500 (*)    Hgb urine dipstick MODERATE (*)    Leukocytes,Ua SMALL (*)    All other components within normal limits  COMPREHENSIVE METABOLIC PANEL - Abnormal; Notable for the following components:   Glucose, Bld 506 (*)    Total Protein 6.4 (*)    Alkaline Phosphatase 137 (*)    All other components within normal limits  BLOOD GAS, VENOUS - Abnormal; Notable for the following components:   pO2, Ven <31 (*)    Bicarbonate 29.6 (*)    Acid-Base Excess 3.4 (*)    All other components within normal limits  CBG MONITORING, ED -  Abnormal; Notable for the following components:   Glucose-Capillary 504 (*)    All other components within normal limits  CBC  BETA-HYDROXYBUTYRIC ACID  CBG MONITORING, ED    EKG None  Radiology No results found.  Procedures Procedures  {Document cardiac monitor, telemetry assessment procedure when appropriate:1}  Medications Ordered in ED Medications  insulin aspart (novoLOG) injection 15 Units (15 Units Subcutaneous Given 06/03/23 1714)    ED Course/ Medical Decision Making/ A&P   {   Click here for ABCD2, HEART and other calculatorsREFRESH Note before signing :1}  Medical Decision Making Amount and/or Complexity of Data Reviewed Labs: ordered.  Risk Prescription drug management.   ***  {Document critical care time when appropriate:1} {Document review of labs and clinical decision tools ie heart score, Chads2Vasc2 etc:1}  {Document your independent review of radiology images, and any outside records:1} {Document your discussion with family members, caretakers, and with consultants:1} {Document social determinants of health affecting pt's care:1} {Document your decision making why or why not admission, treatments were needed:1} Final Clinical Impression(s) / ED Diagnoses Final diagnoses:  None    Rx / DC Orders ED Discharge Orders     None

## 2023-06-03 NOTE — Progress Notes (Signed)
I called and left a detailed message informing the patient to report to the ED for urgent care due to an elevated blood glucose level of 504 and an anion gap of 16.

## 2023-06-06 ENCOUNTER — Telehealth: Payer: Self-pay | Admitting: Family Medicine

## 2023-06-06 NOTE — Telephone Encounter (Signed)
Lanora Manis called from [facility] need to verify last office visit about  the insulin measurement verify the dosage of insulin.  Her call back # [phone]

## 2023-06-07 ENCOUNTER — Telehealth: Payer: Self-pay | Admitting: Family Medicine

## 2023-06-07 ENCOUNTER — Other Ambulatory Visit: Payer: Self-pay | Admitting: Family Medicine

## 2023-06-07 DIAGNOSIS — E1165 Type 2 diabetes mellitus with hyperglycemia: Secondary | ICD-10-CM

## 2023-06-07 DIAGNOSIS — I10 Essential (primary) hypertension: Secondary | ICD-10-CM

## 2023-06-07 MED ORDER — BLOOD GLUCOSE TEST VI STRP: 1.0000 | ORAL_STRIP | Freq: Three times a day (TID) | 0 refills | Status: AC

## 2023-06-07 MED ORDER — BLOOD GLUCOSE MONITORING SUPPL DEVI: 1.0000 | Freq: Three times a day (TID) | 0 refills | Status: DC

## 2023-06-07 MED ORDER — TIRZEPATIDE 2.5 MG/0.5ML ~~LOC~~ SOAJ: 2.5000 mg | SUBCUTANEOUS | 0 refills | Status: DC

## 2023-06-07 MED ORDER — LISINOPRIL 5 MG PO TABS
5.0000 mg | ORAL_TABLET | Freq: Every day | ORAL | 3 refills | Status: DC
Start: 2023-06-07 — End: 2023-11-06

## 2023-06-07 MED ORDER — INSULIN SYRINGE 28G X 1/2" 0.5 ML MISC: 0 refills | Status: DC

## 2023-06-07 MED ORDER — METFORMIN HCL 1000 MG PO TABS: 1000.0000 mg | ORAL_TABLET | Freq: Two times a day (BID) | ORAL | 3 refills | Status: DC

## 2023-06-07 MED ORDER — ATORVASTATIN CALCIUM 80 MG PO TABS: 80.0000 mg | ORAL_TABLET | Freq: Every day | ORAL | 1 refills | Status: DC

## 2023-06-07 NOTE — Telephone Encounter (Signed)
Rx sent 

## 2023-06-07 NOTE — Telephone Encounter (Signed)
Amy Livingston w. [facility]  called in on  patient behalf  Urgent  verbal verification on pt Insulin   Also needs chart notes DOS 06/01/23  Diabetic notes   Fax to [facility] Govern : [phone]  Phone [phone]

## 2023-06-07 NOTE — Telephone Encounter (Signed)
Needs office notes faxed to [phone] attn: elizabeth .

## 2023-06-07 NOTE — Telephone Encounter (Signed)
Notes were faxed today

## 2023-06-07 NOTE — Telephone Encounter (Signed)
Notes faxed.

## 2023-06-07 NOTE — Telephone Encounter (Signed)
Called needs something sent into her pharmacy so she can take her insulin with. Need needles, syringes  Pharmacy: [facility] [address] [facility]

## 2023-06-08 ENCOUNTER — Ambulatory Visit (HOSPITAL_COMMUNITY): Payer: Medicare Other

## 2023-06-09 ENCOUNTER — Encounter: Payer: Self-pay | Admitting: Internal Medicine

## 2023-06-09 ENCOUNTER — Encounter: Payer: Self-pay | Admitting: *Deleted

## 2023-06-09 ENCOUNTER — Ambulatory Visit: Payer: Medicare Other | Admitting: Internal Medicine

## 2023-06-09 ENCOUNTER — Other Ambulatory Visit: Payer: Self-pay | Admitting: *Deleted

## 2023-06-09 VITALS — BP 123/80 | HR 81 | Temp 98.6°F | Ht 66.0 in | Wt 203.4 lb

## 2023-06-09 DIAGNOSIS — K219 Gastro-esophageal reflux disease without esophagitis: Secondary | ICD-10-CM

## 2023-06-09 DIAGNOSIS — Z1211 Encounter for screening for malignant neoplasm of colon: Secondary | ICD-10-CM

## 2023-06-09 DIAGNOSIS — R197 Diarrhea, unspecified: Secondary | ICD-10-CM

## 2023-06-09 DIAGNOSIS — R195 Other fecal abnormalities: Secondary | ICD-10-CM

## 2023-06-09 DIAGNOSIS — R1032 Left lower quadrant pain: Secondary | ICD-10-CM | POA: Diagnosis not present

## 2023-06-09 DIAGNOSIS — R1319 Other dysphagia: Secondary | ICD-10-CM

## 2023-06-09 DIAGNOSIS — K5900 Constipation, unspecified: Secondary | ICD-10-CM

## 2023-06-09 DIAGNOSIS — R1310 Dysphagia, unspecified: Secondary | ICD-10-CM

## 2023-06-09 MED ORDER — PEG 3350-KCL-NA BICARB-NACL 420 G PO SOLR: 4000.0000 mL | Freq: Once | ORAL | 0 refills | Status: AC

## 2023-06-09 NOTE — Progress Notes (Signed)
Primary Care Physician:  Gilmore Laroche, FNP Primary Gastroenterologist:  Dr. Marletta Lor  Chief Complaint  Patient presents with   New Patient (Initial Visit)    Pt here for colon cancer screening and fecal incontinence    HPI:   Amy Livingston is a 67 y.o. female who presents to clinic today by referral from her PCP Gilmore Laroche for evaluation.  Multiple GI complaints for me today.  GERD, dysphagia: Patient reports chronic GERD for which she is on omeprazole 40 mg daily.  Having occasional breakthrough symptoms.  Also notes progressively worsening esophageal dysphagia.  Feels like food will get stuck in her substernal region.  Diarrhea, fecal urgency, incontinence: Patient also complaining of chronically loose stools.  Will no urgent frequency especially after meals.  Occasional incontinence.  Has also notes which she describes as mucus in her stool.  No rectal bleeding.  No melena.  Does have some associated left lower quadrant abdominal pain, mild to moderate, intermittent.    Past Medical History:  Diagnosis Date   Anxiety    Diabetes mellitus without complication (HCC)    GERD (gastroesophageal reflux disease)    Hypertension    PONV (postoperative nausea and vomiting)    Shortness of breath dyspnea     Past Surgical History:  Procedure Laterality Date   ABDOMINAL HYSTERECTOMY     ANTERIOR CERVICAL DECOMP/DISCECTOMY FUSION N/A 01/13/2016   Procedure: C5-6 ANTERIOR CERVICAL DECOMPRESSION/DISKECTOMY/FUSION;  Surgeon: Barnett Abu, MD;  Location: [facility];  Service: Neurosurgery;  Laterality: N/A;  C5-6 ANTERIOR CERVICAL DECOMPRESSION/DISKECTOMY/FUSION   BACK SURGERY     x 5   CHOLECYSTECTOMY     KNEE ARTHROSCOPY     left   TUBAL LIGATION      Current Outpatient Medications  Medication Sig Dispense Refill   atorvastatin (LIPITOR) 80 MG tablet Take 1 tablet (80 mg total) by mouth daily. 90 tablet 1   Blood Glucose Monitoring Suppl DEVI 1 each by Does not apply  route in the morning, at noon, and at bedtime. May substitute to any manufacturer covered by patient's insurance. 1 each 0   Cholecalciferol (VITAMIN D) 50 MCG (2000 UT) CAPS Take by mouth.     ELIQUIS 5 MG TABS tablet Take 5 mg by mouth 2 (two) times daily.     gabapentin (NEURONTIN) 300 MG capsule Take 300 mg by mouth 3 (three) times daily.     Glucose Blood (BLOOD GLUCOSE TEST STRIPS) STRP 1 each by In Vitro route in the morning, at noon, and at bedtime. May substitute to any manufacturer covered by patient's insurance. 100 strip 0   lisinopril (ZESTRIL) 5 MG tablet Take 1 tablet (5 mg total) by mouth daily. 90 tablet 3   meclizine (ANTIVERT) 25 MG tablet Take 25 mg by mouth daily as needed for dizziness.      metFORMIN (GLUCOPHAGE) 1000 MG tablet Take 1 tablet (1,000 mg total) by mouth 2 (two) times daily with a meal. 180 tablet 3   metoprolol tartrate (LOPRESSOR) 50 MG tablet Take 50 mg by mouth 2 (two) times daily.     omeprazole (PRILOSEC) 40 MG capsule Take 40 mg by mouth daily.       oxyCODONE-acetaminophen (PERCOCET/ROXICET) 5-325 MG tablet Take 1-2 tablets by mouth every 4 (four) hours as needed for moderate pain. (Patient taking differently: Take 1-2 tablets by mouth every 8 (eight) hours as needed for moderate pain (pain score 4-6).) 40 tablet 0   RESTASIS 0.05 % ophthalmic emulsion 1  drop 2 (two) times daily.     sertraline (ZOLOFT) 50 MG tablet Take 50 mg by mouth daily.     tirzepatide [facility]) 2.5 MG/0.5ML Pen Inject 2.5 mg into the skin once a week. 2 mL 0   cyclobenzaprine (FLEXERIL) 10 MG tablet Take 1 tablet (10 mg total) by mouth 3 (three) times daily as needed for muscle spasms. 30 tablet 5   insulin detemir (LEVEMIR FLEXPEN) 100 UNIT/ML FlexPen Inject 10 Units into the skin at bedtime. (Patient not taking: Reported on 06/09/2023) 15 mL 11   insulin lispro (HUMALOG) 100 UNIT/ML injection Inject 0.04 mLs (4 Units total) into the skin in the morning and at bedtime. (Patient not  taking: Reported on 06/09/2023) 10 mL 2   Insulin Syringe-Needle U-100 (INSULIN SYRINGE .5CC/28G) 28G X 1/2" 0.5 ML MISC For insulin adm (Patient not taking: Reported on 06/09/2023) 100 each 0   Lancet Device MISC 1 each by Does not apply route in the morning, at noon, and at bedtime. May substitute to any manufacturer covered by patient's insurance. (Patient not taking: Reported on 06/09/2023) 1 each 0   Lancets Misc. MISC 1 each by Does not apply route in the morning, at noon, and at bedtime. May substitute to any manufacturer covered by patient's insurance. (Patient not taking: Reported on 06/09/2023) 100 each 0   No current facility-administered medications for this visit.    Allergies as of 06/09/2023 - Review Complete 06/09/2023  Allergen Reaction Noted   Elemental sulfur Itching and Rash 12/30/2015    No family history on file.  Social History   Socioeconomic History   Marital status: Divorced    Spouse name: Not on file   Number of children: Not on file   Years of education: Not on file   Highest education level: Some college, no degree  Occupational History   Not on file  Tobacco Use   Smoking status: Never   Smokeless tobacco: Never  Substance and Sexual Activity   Alcohol use: No   Drug use: No   Sexual activity: Not on file  Other Topics Concern   Not on file  Social History Narrative   Not on file   Social Determinants of Health   Financial Resource Strain: Low Risk  (05/29/2023)   Overall Financial Resource Strain (CARDIA)    Difficulty of Paying Living Expenses: Not very hard  Food Insecurity: Food Insecurity Present (05/29/2023)   Hunger Vital Sign    Worried About Running Out of Food in the Last Year: Sometimes true    Ran Out of Food in the Last Year: Never true  Transportation Needs: No Transportation Needs (05/29/2023)   PRAPARE - Administrator, Civil Service (Medical): No    Lack of Transportation (Non-Medical): No  Physical Activity:  Unknown (05/29/2023)   Exercise Vital Sign    Days of Exercise per Week: 0 days    Minutes of Exercise per Session: Not on file  Stress: Stress Concern Present (05/29/2023)   [employer] of Occupational Health - Occupational Stress Questionnaire    Feeling of Stress : Very much  Social Connections: Socially Isolated (05/29/2023)   Social Connection and Isolation Panel [NHANES]    Frequency of Communication with Friends and Family: More than three times a week    Frequency of Social Gatherings with Friends and Family: Never    Attends Religious Services: Never    Database administrator or Organizations: No    Attends Banker  Meetings: Not on file    Marital Status: Divorced  Intimate Partner Violence: Not At Risk (02/25/2022)   Received from [facility]   Humiliation, Afraid, Rape, and Kick questionnaire    Fear of Current or Ex-Partner: No    Emotionally Abused: No    Physically Abused: No    Sexually Abused: No    Subjective: Review of Systems  Constitutional:  Negative for chills and fever.  HENT:  Negative for congestion and hearing loss.   Eyes:  Negative for blurred vision and double vision.  Respiratory:  Negative for cough and shortness of breath.   Cardiovascular:  Negative for chest pain and palpitations.  Gastrointestinal:  Positive for diarrhea. Negative for abdominal pain, blood in stool, constipation, heartburn, melena and vomiting.  Genitourinary:  Negative for dysuria and urgency.  Musculoskeletal:  Negative for joint pain and myalgias.  Skin:  Negative for itching and rash.  Neurological:  Negative for dizziness and headaches.  Psychiatric/Behavioral:  Negative for depression. The patient is not nervous/anxious.        Objective: BP 123/80   Pulse 81   Temp 98.6 F (37 C)   Ht 5\' 6"  (1.676 m)   Wt 203 lb 6.4 oz (92.3 kg)   BMI 32.83 kg/m  Physical Exam Constitutional:      Appearance: Normal appearance.  HENT:      Head: Normocephalic and atraumatic.  Eyes:     Extraocular Movements: Extraocular movements intact.     Conjunctiva/sclera: Conjunctivae normal.  Cardiovascular:     Rate and Rhythm: Normal rate and regular rhythm.  Pulmonary:     Effort: Pulmonary effort is normal.     Breath sounds: Normal breath sounds.  Abdominal:     General: Bowel sounds are normal.     Palpations: Abdomen is soft.  Musculoskeletal:        General: No swelling. Normal range of motion.     Cervical back: Normal range of motion and neck supple.  Skin:    General: Skin is warm and dry.     Coloration: Skin is not jaundiced.  Neurological:     General: No focal deficit present.     Mental Status: She is alert and oriented to person, place, and time.  Psychiatric:        Mood and Affect: Mood normal.        Behavior: Behavior normal.      Assessment/Plan:  1.  Chronic GERD, esophageal dysphagia- Will schedule for EGD with possible dilation to evaluate for peptic ulcer disease, esophagitis, gastritis, H. Pylori, duodenitis, or other. Will also evaluate for esophageal stricture, Schatzki's ring, esophageal web or other.   Continue on omeprazole daily for now, may make adjustments pending endoscopic findings.  2.  Left lower quadrant abdominal pain, mucus in stool, diarrhea-at same time as upper endoscopy, will perform colonoscopy to further evaluate.  The risks including infection, bleed, or perforation as well as benefits, limitations, alternatives and imponderables have been reviewed with the patient. Potential for esophageal dilation, biopsy, etc. have also been reviewed.  Questions have been answered. All parties agreeable.  Thank you Gilmore Laroche for the kind referral.   06/09/2023 3:56 PM   Disclaimer: This note was dictated with voice recognition software. Similar sounding words can inadvertently be transcribed and may not be corrected upon review.

## 2023-06-09 NOTE — Patient Instructions (Signed)
We will schedule you for upper endoscopy and colonoscopy to further evaluate your GI symptoms.  I may elect to stretch your esophagus depending on findings.  Continue on omeprazole daily.  It was very nice meeting both you today.  Dr. Marletta Lor

## 2023-06-10 ENCOUNTER — Encounter: Payer: Self-pay | Admitting: *Deleted

## 2023-06-10 NOTE — Telephone Encounter (Signed)
Lanora Manis w [facility] calling says she is needing clarification if pt is currently taking insulin. Please advise [phone] Thank you

## 2023-06-13 ENCOUNTER — Other Ambulatory Visit: Payer: Self-pay

## 2023-06-13 NOTE — Telephone Encounter (Signed)
Spoke with Amy Livingston states insulin was not on her med list from last office visit notes , medications added , needs an addendum added to the last visit notes stating pt is on insulin , please advice?

## 2023-06-14 ENCOUNTER — Encounter: Payer: Self-pay | Admitting: *Deleted

## 2023-06-15 ENCOUNTER — Ambulatory Visit (HOSPITAL_COMMUNITY): Payer: Medicare Other | Attending: Family Medicine

## 2023-06-15 ENCOUNTER — Other Ambulatory Visit: Payer: Self-pay

## 2023-06-15 ENCOUNTER — Encounter (HOSPITAL_COMMUNITY): Payer: Self-pay

## 2023-06-15 DIAGNOSIS — M6281 Muscle weakness (generalized): Secondary | ICD-10-CM | POA: Insufficient documentation

## 2023-06-15 DIAGNOSIS — R531 Weakness: Secondary | ICD-10-CM | POA: Diagnosis not present

## 2023-06-15 DIAGNOSIS — Z7409 Other reduced mobility: Secondary | ICD-10-CM | POA: Insufficient documentation

## 2023-06-15 NOTE — Therapy (Signed)
OUTPATIENT PHYSICAL THERAPY  EVALUATION   Patient Name: Amy Livingston MRN: [id number] DOB:Jan 13, 1956, 67 y.o., female Today's Date: 06/15/2023  END OF SESSION:  PT End of Session - 06/15/23 1609     Visit Number 1 (P)     Date for PT Re-Evaluation 08/10/23 (P)     Authorization Type Medicare A & B primary; [facility] secondary (P)     Authorization Time Period no auth; no limit (P)     Authorization - Visit Number 1 (P)     Progress Note Due on Visit 10 (P)     PT Start Time 1517 (P)     PT Stop Time 1600 (P)     PT Time Calculation (min) 43 min (P)     Behavior During Therapy WFL for tasks assessed/performed (P)              Past Medical History:  Diagnosis Date   Anxiety    Diabetes mellitus without complication (HCC)    GERD (gastroesophageal reflux disease)    Hypertension    PONV (postoperative nausea and vomiting)    Shortness of breath dyspnea    Past Surgical History:  Procedure Laterality Date   ABDOMINAL HYSTERECTOMY     ANTERIOR CERVICAL DECOMP/DISCECTOMY FUSION N/A 01/13/2016   Procedure: C5-6 ANTERIOR CERVICAL DECOMPRESSION/DISKECTOMY/FUSION;  Surgeon: Barnett Abu, MD;  Location: [facility];  Service: Neurosurgery;  Laterality: N/A;  C5-6 ANTERIOR CERVICAL DECOMPRESSION/DISKECTOMY/FUSION   BACK SURGERY     x 5   CHOLECYSTECTOMY     KNEE ARTHROSCOPY     left   TUBAL LIGATION     Patient Active Problem List   Diagnosis Date Noted   Hypertension 06/01/2023   Urinary frequency 06/01/2023   Bowel incontinence 06/01/2023   Generalized weakness 06/01/2023   Encounter for immunization 06/01/2023   Diabetes 1.5, 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   PAF (paroxysmal atrial fibrillation) (HCC) 08/14/2020   Dyslipidemia 07/01/2020   Type 2 diabetes mellitus without complication, with long-term current use of insulin (HCC) 07/01/2020   Body mass index (BMI) 40.0-44.9, adult (HCC) 11/07/2019   Elevated blood-pressure reading, without diagnosis of hypertension  11/07/2019   Spondylolisthesis, lumbar region 07/18/2018   Cervical spondylosis with myelopathy 01/13/2016   Cervical disc disorder with myelopathy 12/04/2015   Neck pain 12/04/2015    PCP: Gilmore Laroche FNP  REFERRING PROVIDER: Gilmore Laroche FNP  REFERRING DIAG: R53.1 (ICD-10-CM) - Generalized weakness   THERAPY DIAG:  Muscle weakness (generalized)  Impaired functional mobility, balance, gait, and endurance  Rationale for Evaluation and Treatment: Rehabilitation  ONSET DATE: ~ 2 years ago  SUBJECTIVE:   SUBJECTIVE STATEMENT: Pt reports that she has lost a lot of strength in the last two years. Noticed significant difference in November of last year. Pt has received a lot of testing within last two years, blood work and colonscopy. A lot of bowel deficits. Pt has lost 100 pounds. Limited appetite due to diabetes medications. *pt looks to daughter to confirm information* Pt and daughter reports significant balance deficits.   PERTINENT HISTORY: Diabetic Broken Collar Bone 2012 PAIN:  Are you having pain?  LBP, hip, and knees and everywhere.   PRECAUTIONS: None  RED FLAGS: Bowel or bladder incontinence: Yes: Bladder incontinence    WEIGHT BEARING RESTRICTIONS: No  FALLS:  Has patient fallen in last 6 months?  Multiple falls outside 6 months  LIVING ENVIRONMENT: Lives with: lives with their daughter Lives in: House/apartment Stairs: Yes: External: 15 steps; on left  going up Has following equipment at home: Walker - 4 wheeled and Hurry 3 point cane  OCCUPATION: Disability  PLOF: Independent  PATIENT GOALS: "increase walking distance and improving strength  NEXT MD VISIT: tomorrow   OBJECTIVE:  Note: Objective measures were completed at Evaluation unless otherwise noted.  DIAGNOSTIC FINDINGS:   PATIENT SURVEYS:  ABC scale 33.8%  COGNITION: Overall cognitive status: Within functional limits for tasks assessed     SENSATION: WFL  POSTURE: rounded  shoulders and forward head  LOWER EXTREMITY ROM:  Active ROM Right eval Left eval  Hip flexion    Hip extension    Hip abduction    Hip adduction    Hip internal rotation    Hip external rotation    Knee flexion    Knee extension    Ankle dorsiflexion    Ankle plantarflexion    Ankle inversion    Ankle eversion     (Blank rows = not tested)  LOWER EXTREMITY MMT:  MMT Right eval Left eval  Hip flexion 3+ 3+  Hip extension 3 3  Hip abduction 3+ 3+  Hip adduction    Hip internal rotation    Hip external rotation    Knee flexion 3+ 3+  Knee extension 4- 4-  Ankle dorsiflexion 3+ 3+  Ankle plantarflexion 2+ 2+  Ankle inversion    Ankle eversion     (Blank rows = not tested)  FUNCTIONAL TESTS:  30 seconds chair stand test: 2x Timed up and go (TUG): 16 seconds : unable to complete 17 seconds left.   GAIT: Distance walked: 14ft Assistive device utilized:  tri-cane Level of assistance: Modified independence Comments: limited hip flexion during swing bilaterally, bilateral trendelenberg during swing and stance phase, R lateral staggering intermittently.    TODAY'S TREATMENT:                                                                                                                              DATE: 06/15/2023 PT Evaluation   PATIENT EDUCATION:  Education details: PT Evaluation, findings, prognosis, frequency, attendance policy, and HEP if given.  Person educated: Patient and Child(ren) Education method: Explanation Education comprehension: verbalized understanding  HOME EXERCISE PROGRAM: Initiate next session.   ASSESSMENT:  CLINICAL IMPRESSION: Patient is a 67 y.o. female who was seen today for physical therapy evaluation and treatment for R53.1 (ICD-10-CM) - Generalized weakness. Pt coming to physical therapy due to onset of muscle weakness over 2 years. No significant PMH noted as of now except, Diabetic and Lumbar spine surgery. Pt reporting 100  pound weight loss over the past two years.  Pt demonstrating significant functional limitations in transfers, ADLs, ambulation due to gross BLE muscle weakness, balance deficits, and deconditioning. Pt will benefit from skilled Physical Therapy services to address deficits/limitations in order to improve functional and QOL.   OBJECTIVE IMPAIRMENTS: Abnormal gait, decreased balance, decreased mobility, difficulty walking, decreased ROM, decreased strength, and postural dysfunction.  ACTIVITY LIMITATIONS: carrying, lifting, bending, standing, squatting, stairs, transfers, and locomotion level  PARTICIPATION LIMITATIONS: cleaning, laundry, driving, shopping, community activity, and occupation  PERSONAL FACTORS: Behavior pattern and 1-2 comorbidities: Diabetes  are also affecting patient's functional outcome.   REHAB POTENTIAL: Good  CLINICAL DECISION MAKING: Stable/uncomplicated  EVALUATION COMPLEXITY: Low   GOALS: Goals reviewed with patient? No  SHORT TERM GOALS: Target date: 07/13/2023  Pt will be independent with HEP in order to demonstrate participation in Physical Therapy POC.  Baseline: Goal status: INITIAL  2.  Pt will complete to demonstrate improved muscular endurance and ambulation capacity.   Baseline:  Goal status: INITIAL  LONG TERM GOALS: Target date: 08/10/2023  Pt will improve 30 second chair test by 10 in order to demonstrate improved functional strength to return to desired activities.  Baseline: See objective.  Goal status: INITIAL  2.  Pt will reduce TUG by 5 seconds in order to demonstrate improved functional ambulatory capacity in community setting.  Baseline: See objective.  Goal status: INITIAL  3.  Pt will improve ABC Scale score by 10 points in order to demonstrate improved pain with functional goals and outcomes. Baseline: See objective.  Goal status: INITIAL  4.  Pt will improve BLE MMT by >1/2 grade in order to improve functional strength and  participation during functional activities.. Baseline: See objective.  Goal status: INITIAL   PLAN:  PT FREQUENCY: 2x/week  PT DURATION: 8 weeks  PLANNED INTERVENTIONS: 97164- PT Re-evaluation, 97110-Therapeutic exercises, 97530- Therapeutic activity, 97112- Neuromuscular re-education, 97535- Self Care, 25956- Manual therapy, [phone]- Gait training, Patient/Family education, Balance training, and Stair training  PLAN FOR NEXT SESSION: general strength and conditioning, BLE muscle weakness and deconditioning.   Nelida Meuse PT, DPT Physical Therapist with [facility] [phone] office   Nelida Meuse, PT 06/15/2023, 4:10 PM

## 2023-06-16 ENCOUNTER — Ambulatory Visit (INDEPENDENT_AMBULATORY_CARE_PROVIDER_SITE_OTHER): Payer: Medicare Other | Admitting: Family Medicine

## 2023-06-16 ENCOUNTER — Encounter: Payer: Self-pay | Admitting: Family Medicine

## 2023-06-16 VITALS — BP 121/74 | HR 74 | Ht 66.0 in | Wt 200.1 lb

## 2023-06-16 DIAGNOSIS — E1165 Type 2 diabetes mellitus with hyperglycemia: Secondary | ICD-10-CM

## 2023-06-16 DIAGNOSIS — F411 Generalized anxiety disorder: Secondary | ICD-10-CM | POA: Diagnosis not present

## 2023-06-16 DIAGNOSIS — N3281 Overactive bladder: Secondary | ICD-10-CM

## 2023-06-16 DIAGNOSIS — F419 Anxiety disorder, unspecified: Secondary | ICD-10-CM

## 2023-06-16 DIAGNOSIS — Z794 Long term (current) use of insulin: Secondary | ICD-10-CM | POA: Diagnosis not present

## 2023-06-16 DIAGNOSIS — G629 Polyneuropathy, unspecified: Secondary | ICD-10-CM

## 2023-06-16 DIAGNOSIS — E1140 Type 2 diabetes mellitus with diabetic neuropathy, unspecified: Secondary | ICD-10-CM

## 2023-06-16 MED ORDER — BUSPIRONE HCL 10 MG PO TABS: 10.0000 mg | ORAL_TABLET | Freq: Two times a day (BID) | ORAL | 1 refills | Status: DC

## 2023-06-16 MED ORDER — SERTRALINE HCL 50 MG PO TABS: 50.0000 mg | ORAL_TABLET | Freq: Every day | ORAL | 1 refills | Status: DC

## 2023-06-16 MED ORDER — INSULIN GLARGINE 100 UNIT/ML ~~LOC~~ SOLN: 10.0000 [IU] | Freq: Every day | SUBCUTANEOUS | 9 refills | Status: DC

## 2023-06-16 MED ORDER — MIRABEGRON ER 25 MG PO TB24: 25.0000 mg | ORAL_TABLET | Freq: Every day | ORAL | 1 refills | Status: DC

## 2023-06-16 MED ORDER — GABAPENTIN 300 MG PO CAPS: 300.0000 mg | ORAL_CAPSULE | Freq: Three times a day (TID) | ORAL | 1 refills | Status: DC

## 2023-06-16 NOTE — Progress Notes (Addendum)
Established Patient Office Visit  Subjective:  Patient ID: Amy Livingston, female    DOB: [birth year]  Age: 67 y.o. MRN: [id number]  CC:  Chief Complaint  Patient presents with   Care Management    2 week f/u.    HPI Amy Livingston is a 67 y.o. female presents for type 2 diabetes f/u   Type 2 Diabetes: The patient reports that she has not been able to start her long-acting insulin detemir due to it being out of stock. She has been compliant with her insulin lispro (4 units twice daily), Mounjaro 2.5 mg weekly injection, and metformin 1000 mg twice daily. She notes that her blood sugar has been in the 250s but denies symptoms of polyuria, polyphagia, or dyspnea at this time. She has an upcoming appointment with a diabetes educator, and we will follow up on the referral placed to endocrinology.    Past Medical History:  Diagnosis Date   Anxiety    Diabetes mellitus without complication (HCC)    GERD (gastroesophageal reflux disease)    Hypertension    PONV (postoperative nausea and vomiting)    Shortness of breath dyspnea     Past Surgical History:  Procedure Laterality Date   ABDOMINAL HYSTERECTOMY     ANTERIOR CERVICAL DECOMP/DISCECTOMY FUSION N/A 01/13/2016   Procedure: C5-6 ANTERIOR CERVICAL DECOMPRESSION/DISKECTOMY/FUSION;  Surgeon: Barnett Abu, MD;  Location: [facility];  Service: Neurosurgery;  Laterality: N/A;  C5-6 ANTERIOR CERVICAL DECOMPRESSION/DISKECTOMY/FUSION   BACK SURGERY     x 5   CHOLECYSTECTOMY     KNEE ARTHROSCOPY     left   TUBAL LIGATION      History reviewed. No pertinent family history.  Social History   Socioeconomic History   Marital status: Divorced    Spouse name: Not on file   Number of children: Not on file   Years of education: Not on file   Highest education level: Some college, no degree  Occupational History   Not on file  Tobacco Use   Smoking status: Never   Smokeless tobacco: Never  Substance and Sexual Activity   Alcohol  use: No   Drug use: No   Sexual activity: Not on file  Other Topics Concern   Not on file  Social History Narrative   Not on file   Social Determinants of Health   Financial Resource Strain: Low Risk  (05/29/2023)   Overall Financial Resource Strain (CARDIA)    Difficulty of Paying Living Expenses: Not very hard  Food Insecurity: Food Insecurity Present (05/29/2023)   Hunger Vital Sign    Worried About Running Out of Food in the Last Year: Sometimes true    Ran Out of Food in the Last Year: Never true  Transportation Needs: No Transportation Needs (05/29/2023)   PRAPARE - Administrator, Civil Service (Medical): No    Lack of Transportation (Non-Medical): No  Physical Activity: Unknown (05/29/2023)   Exercise Vital Sign    Days of Exercise per Week: 0 days    Minutes of Exercise per Session: Not on file  Stress: Stress Concern Present (05/29/2023)   [employer] of Occupational Health - Occupational Stress Questionnaire    Feeling of Stress : Very much  Social Connections: Socially Isolated (05/29/2023)   Social Connection and Isolation Panel [NHANES]    Frequency of Communication with Friends and Family: More than three times a week    Frequency of Social Gatherings with Friends and Family: Never  Attends Religious Services: Never    Active Member of Clubs or Organizations: No    Attends Banker Meetings: Not on file    Marital Status: Divorced  Intimate Partner Violence: Not At Risk (02/25/2022)   Received from [facility], [facility]   Humiliation, Afraid, Rape, and Kick questionnaire    Fear of Current or Ex-Partner: No    Emotionally Abused: No    Physically Abused: No    Sexually Abused: No    Outpatient Medications Prior to Visit  Medication Sig Dispense Refill   atorvastatin (LIPITOR) 80 MG tablet Take 1 tablet (80 mg total) by mouth daily. 90 tablet 1   BD INSULIN SYRINGE U/F 31G X 5/16" 1 ML MISC USE FOR INSULIN      Blood Glucose Monitoring Suppl DEVI 1 each by Does not apply route in the morning, at noon, and at bedtime. May substitute to any manufacturer covered by patient's insurance. 1 each 0   Cholecalciferol (VITAMIN D) 50 MCG (2000 UT) CAPS Take by mouth.     ELIQUIS 5 MG TABS tablet Take 5 mg by mouth 2 (two) times daily.     Glucose Blood (BLOOD GLUCOSE TEST STRIPS) STRP 1 each by In Vitro route in the morning, at noon, and at bedtime. May substitute to any manufacturer covered by patient's insurance. 100 strip 0   insulin aspart (NOVOLOG) 100 UNIT/ML injection ADMINISTER 20 UNITS UNDER THE SKIN THREE TIMES DAILY BEFORE MEALS     Insulin Pen Needle (NOVOFINE PEN NEEDLE) 32G X 6 MM MISC USE TO INJECT INSULIN FOUR TIMES DAILY     lisinopril (ZESTRIL) 5 MG tablet Take 1 tablet (5 mg total) by mouth daily. 90 tablet 3   meclizine (ANTIVERT) 25 MG tablet Take 25 mg by mouth daily as needed for dizziness.      metFORMIN (GLUCOPHAGE) 1000 MG tablet Take 1 tablet (1,000 mg total) by mouth 2 (two) times daily with a meal. 180 tablet 3   metoprolol tartrate (LOPRESSOR) 50 MG tablet Take 50 mg by mouth 2 (two) times daily.     omeprazole (PRILOSEC) 40 MG capsule Take 40 mg by mouth daily.       oxyCODONE-acetaminophen (PERCOCET/ROXICET) 5-325 MG tablet Take 1-2 tablets by mouth every 4 (four) hours as needed for moderate pain. (Patient taking differently: Take 1-2 tablets by mouth every 8 (eight) hours as needed for moderate pain (pain score 4-6).) 40 tablet 0   RESTASIS 0.05 % ophthalmic emulsion 1 drop 2 (two) times daily.     tirzepatide [facility]) 2.5 MG/0.5ML Pen Inject 2.5 mg into the skin once a week. 2 mL 0   gabapentin (NEURONTIN) 300 MG capsule Take 300 mg by mouth 3 (three) times daily.     insulin detemir (LEVEMIR) 100 UNIT/ML injection Inject into the skin.     sertraline (ZOLOFT) 50 MG tablet Take 50 mg by mouth daily.     No facility-administered medications prior to visit.    Allergies   Allergen Reactions   Elemental Sulfur Itching and Rash    Yeast infections    ROS Review of Systems  Constitutional:  Negative for chills and fever.  Eyes:  Negative for visual disturbance.  Respiratory:  Negative for chest tightness and shortness of breath.   Endocrine: Negative for polydipsia, polyphagia and polyuria.  Neurological:  Negative for dizziness and headaches.      Objective:    Physical Exam HENT:     Head: Normocephalic.  Mouth/Throat:     Mouth: Mucous membranes are moist.  Cardiovascular:     Rate and Rhythm: Normal rate.     Heart sounds: Normal heart sounds.  Pulmonary:     Effort: Pulmonary effort is normal.     Breath sounds: Normal breath sounds.  Neurological:     Mental Status: She is alert.     BP 121/74   Pulse 74   Ht 5\' 6"  (1.676 m)   Wt 200 lb 1.9 oz (90.8 kg)   SpO2 96%   BMI 32.30 kg/m  Wt Readings from Last 3 Encounters:  06/16/23 200 lb 1.9 oz (90.8 kg)  06/09/23 203 lb 6.4 oz (92.3 kg)  06/03/23 195 lb (88.5 kg)    Lab Results  Component Value Date   TSH 0.634 06/01/2023   Lab Results  Component Value Date   WBC 5.6 06/03/2023   HGB 13.2 06/03/2023   HCT 40.7 06/03/2023   MCV 92.3 06/03/2023   PLT 172 06/03/2023   Lab Results  Component Value Date   NA 135 06/03/2023   K 4.3 06/03/2023   CO2 27 06/03/2023   GLUCOSE 506 (HH) 06/03/2023   BUN 11 06/03/2023   CREATININE 0.63 06/03/2023   BILITOT 0.6 06/03/2023   ALKPHOS 137 (H) 06/03/2023   AST 16 06/03/2023   ALT 17 06/03/2023   PROT 6.4 (L) 06/03/2023   ALBUMIN 3.5 06/03/2023   CALCIUM 9.2 06/03/2023   ANIONGAP 9 06/03/2023   EGFR 86 06/01/2023   Lab Results  Component Value Date   CHOL 130 06/01/2023   Lab Results  Component Value Date   HDL 49 06/01/2023   Lab Results  Component Value Date   LDLCALC 57 06/01/2023   Lab Results  Component Value Date   TRIG 138 06/01/2023   Lab Results  Component Value Date   CHOLHDL 2.7 06/01/2023    Lab Results  Component Value Date   HGBA1C >15.5 (H) 06/01/2023      Assessment & Plan:  Type 2 diabetes mellitus with hyperglycemia, with long-term current use of insulin (HCC) Assessment & Plan: The patient will be started on Lantus at 10 units at bedtime. We will likely increase the dose to 18 units at bedtime depending on her glucose levels in the coming weeks. She is encouraged to continue taking Mounjaro 2.5 mg weekly and to maintain her metformin at 1000 mg twice daily. Additionally, she is advised to decrease her intake of high-sugar foods and beverages while increasing physical activity. A diabetic foot examination was completed today in the clinic.pt needs a CGM.      Orders: -     Insulin Glargine; Inject 0.1 mLs (10 Units total) into the skin at bedtime.  Dispense: 10 mL; Refill: 9  Overactive bladder Assessment & Plan: The patient complains of involuntary urine loss and leakage with coughing and increased movement. She was provided a sample of Myrbetriq 25 mg daily, which she reports has relieved her symptoms, and she has requested a prescription. A prescription for Myrbetriq 25 mg daily has been sent to her pharmacy.  Nonpharmacological interventions were reviewed, including:  Limiting irritants such as caffeine, alcohol, spicy foods, and artificial sweeteners. Drinking a moderate amount of fluids to prevent dehydration and avoid bladder overactivity. Maintaining a healthy weight to reduce pressure on the bladder.   Orders: -     Mirabegron ER; Take 1 tablet (25 mg total) by mouth daily.  Dispense: 90 tablet; Refill: 1  Generalized anxiety  disorder Assessment & Plan: The patient denies suicidal thoughts or ideation. She requested a refill of sertraline 50 mg daily and buspirone 10 mg twice daily, which has been sent to the pharmacy. She was advised to implement nonpharmacological measures to better manage her anxiety, including mindfulness, deep breathing exercises,  meditation, and journaling.   Orders: -     Sertraline HCl; Take 1 tablet (50 mg total) by mouth daily.  Dispense: 90 tablet; Refill: 1 -     busPIRone HCl; Take 1 tablet (10 mg total) by mouth 2 (two) times daily.  Dispense: 90 tablet; Refill: 1  Type 2 diabetes mellitus with diabetic neuropathy, with long-term current use of insulin (HCC) Assessment & Plan: Refill gabapentin 300 mg to take 3 times daily  Orders: -     Gabapentin; Take 1 capsule (300 mg total) by mouth 3 (three) times daily.  Dispense: 120 capsule; Refill: 1  Note: This chart has been completed using Engineer, civil (consulting) software, and while attempts have been made to ensure accuracy, certain words and phrases may not be transcribed as intended.    Follow-up: No follow-ups on file.   Gilmore Laroche, FNP

## 2023-06-16 NOTE — Patient Instructions (Addendum)
I appreciate the opportunity to provide care to you today!     Follow up: 3 month   Goal blood sugars:  Fasting blood sugars: 80-130 Before lunch and dinner: less than 140 2 hours after meals: less than 180   Please seek urgent care if your blood sugar is >250 on three separate occasions or <70   Please schedule diabetic eye exam  Attached with your AVS, you will find valuable resources for self-education. I highly recommend dedicating some time to thoroughly examine them.   Please continue to a heart-healthy diet and increase your physical activities. Try to exercise for at least five days a week.    It was a pleasure to see you and I look forward to continuing to work together on your health and well-being. Please do not hesitate to call the office if you need care or have questions about your care.  In case of emergency, please visit the Emergency Department for urgent care, or contact our clinic at [phone] to schedule an appointment. We're here to help you!   Have a wonderful day and week. With Gratitude, Gilmore Laroche MSN, FNP-BC

## 2023-06-17 DIAGNOSIS — F411 Generalized anxiety disorder: Secondary | ICD-10-CM | POA: Insufficient documentation

## 2023-06-17 DIAGNOSIS — E1140 Type 2 diabetes mellitus with diabetic neuropathy, unspecified: Secondary | ICD-10-CM | POA: Insufficient documentation

## 2023-06-17 DIAGNOSIS — N3281 Overactive bladder: Secondary | ICD-10-CM | POA: Insufficient documentation

## 2023-06-17 NOTE — Assessment & Plan Note (Signed)
The patient denies suicidal thoughts or ideation. She requested a refill of sertraline 50 mg daily and buspirone 10 mg twice daily, which has been sent to the pharmacy. She was advised to implement nonpharmacological measures to better manage her anxiety, including mindfulness, deep breathing exercises, meditation, and journaling.

## 2023-06-17 NOTE — Assessment & Plan Note (Signed)
The patient complains of involuntary urine loss and leakage with coughing and increased movement. She was provided a sample of Myrbetriq 25 mg daily, which she reports has relieved her symptoms, and she has requested a prescription. A prescription for Myrbetriq 25 mg daily has been sent to her pharmacy.  Nonpharmacological interventions were reviewed, including:  Limiting irritants such as caffeine, alcohol, spicy foods, and artificial sweeteners. Drinking a moderate amount of fluids to prevent dehydration and avoid bladder overactivity. Maintaining a healthy weight to reduce pressure on the bladder.

## 2023-06-17 NOTE — Assessment & Plan Note (Addendum)
The patient will be started on Lantus at 10 units at bedtime. We will likely increase the dose to 18 units at bedtime depending on her glucose levels in the coming weeks. She is encouraged to continue taking Mounjaro 2.5 mg weekly and to maintain her metformin at 1000 mg twice daily. Additionally, she is advised to decrease her intake of high-sugar foods and beverages while increasing physical activity. A diabetic foot examination was completed today in the clinic.pt needs a CGM.

## 2023-06-17 NOTE — Assessment & Plan Note (Signed)
Refill gabapentin 300 mg to take 3 times daily

## 2023-06-20 ENCOUNTER — Ambulatory Visit (HOSPITAL_COMMUNITY): Payer: Medicare Other

## 2023-06-21 ENCOUNTER — Encounter (HOSPITAL_COMMUNITY): Payer: Medicare Other | Admitting: Physical Therapy

## 2023-06-21 ENCOUNTER — Telehealth (HOSPITAL_COMMUNITY): Payer: Self-pay | Admitting: Physical Therapy

## 2023-06-21 NOTE — Telephone Encounter (Signed)
PT did not show for appt (NS#1).  Called and left voicemail regarding NS policy and next appt.   Lurena Nida, PTA/CLT [facility] Outpatient Rehabilitation [facility] Ph: [phone]

## 2023-06-22 ENCOUNTER — Telehealth: Payer: Self-pay | Admitting: Family Medicine

## 2023-06-22 NOTE — Telephone Encounter (Signed)
Copied from CRM [id number]. Topic: Clinical - Prescription Issue >> Jun 22, 2023  2:56 PM Deaijah H wrote: Reason for CRM: pt std insurance doesn't cover insulin glargine (LANTUS) 100 UNIT/ML injection

## 2023-06-23 ENCOUNTER — Encounter (HOSPITAL_COMMUNITY): Payer: Medicare Other

## 2023-06-23 ENCOUNTER — Encounter (HOSPITAL_COMMUNITY): Payer: Self-pay

## 2023-06-23 NOTE — Therapy (Signed)
[facility] Outpatient Rehabilitation at [facility] [address] [place], [place], [address] Phone: [phone]   Fax:  [phone]  Patient Details  Name: Amy Livingston MRN: [id number] Date of Birth: 1955/09/20 Referring Provider:  No ref. provider found  Encounter Date: 06/23/2023  Called pt regarding desire to cancel all appointments. Left voicemail to discuss details further. Will discharge pt today if no call back by 1640.   Nelida Meuse, PT 06/23/2023, 1:08 PM  [facility] Outpatient Rehabilitation at [facility] [address] [place], [place], [address] Phone: [phone]   Fax:  [phone]

## 2023-06-24 ENCOUNTER — Other Ambulatory Visit: Payer: Self-pay | Admitting: Family Medicine

## 2023-06-24 DIAGNOSIS — E1165 Type 2 diabetes mellitus with hyperglycemia: Secondary | ICD-10-CM

## 2023-06-24 NOTE — Telephone Encounter (Signed)
Left detailed vm °

## 2023-06-24 NOTE — Telephone Encounter (Signed)
Kindly ask the patient to use the GoodRx card to purchase Lantus. A referral has been placed to the pharmacy for assistance.

## 2023-06-26 ENCOUNTER — Other Ambulatory Visit: Payer: Self-pay | Admitting: Family Medicine

## 2023-06-26 ENCOUNTER — Telehealth: Payer: Self-pay | Admitting: Family Medicine

## 2023-06-26 DIAGNOSIS — E1165 Type 2 diabetes mellitus with hyperglycemia: Secondary | ICD-10-CM

## 2023-06-26 MED ORDER — FREESTYLE LIBRE 3 SENSOR MISC: 6 refills | Status: DC

## 2023-06-27 ENCOUNTER — Encounter (HOSPITAL_COMMUNITY): Payer: Medicare Other

## 2023-06-27 NOTE — Telephone Encounter (Signed)
Mychart message sent.

## 2023-06-28 ENCOUNTER — Telehealth: Payer: Self-pay

## 2023-06-28 NOTE — Progress Notes (Signed)
   Care Guide Note  06/28/2023 Name: Amy Livingston MRN: [id number] DOB: 10-23-1955  Referred by: Gilmore Laroche, FNP Reason for referral : Care Coordination (Outreach to schedule with Pharm d )   Amy Livingston is a 67 y.o. year old female who is a primary care patient of Gilmore Laroche, FNP. Gordy Clement was referred to the pharmacist for assistance related to DM.    An unsuccessful telephone outreach was attempted today to contact the patient who was referred to the pharmacy team for assistance with medication assistance. Additional attempts will be made to contact the patient.   Penne Lash , RMA     [facility]  [facility], [facility] Guide  Direct Dial: [phone]  Website: [email or web address]

## 2023-06-29 ENCOUNTER — Ambulatory Visit (HOSPITAL_COMMUNITY): Payer: Medicare Other

## 2023-06-29 ENCOUNTER — Encounter (HOSPITAL_COMMUNITY): Payer: Self-pay

## 2023-06-30 ENCOUNTER — Encounter (HOSPITAL_COMMUNITY): Payer: Medicare Other

## 2023-07-04 ENCOUNTER — Encounter (HOSPITAL_COMMUNITY): Payer: Medicare Other

## 2023-07-04 NOTE — Progress Notes (Signed)
   Care Guide Note  07/04/2023 Name: Amy Livingston MRN: [id number] DOB: March 23, 1956  Referred by: Gilmore Laroche, FNP Reason for referral : Care Coordination (Outreach to schedule with Pharm d )   Amy Livingston is a 67 y.o. year old female who is a primary care patient of Gilmore Laroche, FNP. Amy Livingston was referred to the pharmacist for assistance related to DM.    A second unsuccessful telephone outreach was attempted today to contact the patient who was referred to the pharmacy team for assistance with medication assistance. Additional attempts will be made to contact the patient.  Penne Lash , RMA     [facility]  [facility], [facility] Guide  Direct Dial: [phone]  Website: [email or web address]

## 2023-07-05 ENCOUNTER — Encounter: Payer: Self-pay | Admitting: Family Medicine

## 2023-07-05 ENCOUNTER — Telehealth: Payer: Self-pay | Admitting: Family Medicine

## 2023-07-05 NOTE — Telephone Encounter (Signed)
Copied from CRM [id number]. Topic: Clinical - Prescription Issue >> Jul 05, 2023 12:21 PM Dennison Nancy wrote: Reason for CRM: did not get her insulin and her insurance will not cover it  ,

## 2023-07-06 ENCOUNTER — Encounter (HOSPITAL_COMMUNITY): Payer: Medicare Other | Admitting: Physical Therapy

## 2023-07-06 ENCOUNTER — Telehealth: Payer: Self-pay

## 2023-07-06 NOTE — Telephone Encounter (Signed)
Copied from CRM [id number]. Topic: General - Other >> Jul 06, 2023 11:03 AM Donita Brooks wrote: Reason for CRM: Lucy from [facility]  written order- provider cross off the date, need his initals by the date where he cross the [facility] order.  note date 11/7 receive note and says that the assessment plan needs provider to stated that they are ording the [facility] for the pt / anyone can sign of any questions Valentina Gu can be reach at [phone]

## 2023-07-06 NOTE — Telephone Encounter (Signed)
Faxed [facility] form to have filled, spoke to them on the phone , they needed most recent notes.

## 2023-07-08 NOTE — Progress Notes (Signed)
   Care Guide Note  07/08/2023 Name: Amy Livingston MRN: [id number] DOB: 11/21/1955  Referred by: Gilmore Laroche, FNP Reason for referral : Care Coordination (Outreach to schedule with Pharm d )   Amy Livingston is a 67 y.o. year old female who is a primary care patient of Gilmore Laroche, FNP. Gordy Clement was referred to the pharmacist for assistance related to DM.    A third unsuccessful telephone outreach was attempted today to contact the patient who was referred to the pharmacy team for assistance with medication assistance. The Population Health team is pleased to engage with this patient at any time in the future upon receipt of referral and should he/she be interested in assistance from the [employer] [facility] team.   Penne Lash , RMA     [facility]  [facility], [facility] Guide  Direct Dial: [phone]  Website: [email or web address]

## 2023-07-11 ENCOUNTER — Encounter (HOSPITAL_COMMUNITY): Payer: Medicare Other

## 2023-07-14 ENCOUNTER — Encounter (HOSPITAL_COMMUNITY): Payer: Medicare Other

## 2023-07-14 ENCOUNTER — Telehealth: Payer: Self-pay | Admitting: *Deleted

## 2023-07-14 NOTE — Telephone Encounter (Signed)
Lucy from [facility] calling to follow up on note below.

## 2023-07-14 NOTE — Progress Notes (Signed)
  Health Equity Plan Note  07/14/2023 Name: Amy Livingston MRN: [id number] DOB: [birth year]  Amy Livingston is a 67 y.o. year old female who is a primary care patient of Gilmore Laroche, FNP and is actively engaged with the care management team. I reached out to Gordy Clement by phone today to assist with re-scheduling a follow up visit with the RN Case Manager  Follow up plan: Unsuccessful telephone outreach attempt made. A HIPAA compliant phone message was left for the patient providing contact information and requesting a return call.   [facility]  Care Coordination Care Guide  Direct Dial: [phone]

## 2023-07-15 NOTE — Progress Notes (Signed)
  Care Coordination Note  07/15/2023 Name: Amy Livingston MRN: [id number] DOB: 1956-07-23  Amy Livingston is a 68 y.o. year old female who is a primary care patient of Gilmore Laroche, FNP and is actively engaged with the care management team. I reached out to Gordy Clement by phone today to assist with re-scheduling a follow up visit with the RN Case Manager  Follow up plan: Telephone appointment with care management team member scheduled for:07/18/23  [facility] Coordination Care Guide  Direct Dial: [phone]

## 2023-07-15 NOTE — Telephone Encounter (Signed)
Faxed form back.

## 2023-07-18 ENCOUNTER — Encounter (HOSPITAL_COMMUNITY): Payer: Medicare Other

## 2023-07-18 ENCOUNTER — Ambulatory Visit: Payer: Self-pay | Admitting: *Deleted

## 2023-07-18 NOTE — Telephone Encounter (Signed)
completed

## 2023-07-18 NOTE — Patient Outreach (Signed)
   Health Equity Plan Care Coordination   07/18/2023 Name: Jaquelina Acorn MRN: [id number] DOB: 1956/04/10   Care Coordination Outreach Attempts:  An unsuccessful telephone outreach was attempted for a scheduled appointment today.  Follow Up Plan:  Additional outreach attempts will be made to offer the patient care coordination information and services.   Encounter Outcome:  No Answer. Left HIPAA compliant VM.   Care Coordination Interventions:  No, not indicated. Staff message sent to care guide requesting outreach and rescheduling.    Demetrios Loll, RN, BSN Care Management Coordinator [facility]  [facility] Direct Dial: [phone] Main #: [phone]

## 2023-07-18 NOTE — Telephone Encounter (Signed)
Amy Livingston called back from [facility] asked to refax form to [phone] CGM order needs to addendum   saying order CGM is ordering from the provider to note 11.27.2024 call back at [phone].

## 2023-07-20 ENCOUNTER — Encounter (HOSPITAL_COMMUNITY): Payer: Medicare Other | Admitting: Physical Therapy

## 2023-07-20 ENCOUNTER — Encounter: Payer: Self-pay | Admitting: Family Medicine

## 2023-07-21 NOTE — Patient Instructions (Addendum)
Amy Livingston  07/21/2023     @PREFPERIOPPHARMACY @   Your procedure is scheduled on  07/25/2023.   Report to [facility] at  0845  A.M.   Call this number if you have problems the morning of surgery:  [phone]  If you experience any cold or flu symptoms such as cough, fever, chills, shortness of breath, etc. between now and your scheduled surgery, please notify us at the above number.   Remember:         Your last dose of mounjaro should have been on 07/17/2023.        Your last dose of eliquis should be on 07/22/2023.        Take 1/2 of your usual insulin dose the night before your procedure.       DO NOT take any medications for diabetes the morning of your procedure.    Follow the diet and prep instructions given to you by the office.   You may drink clear liquids until 0645 am on 07/25/2023.    Clear liquids allowed are:                    Water, Juice (No red color; non-citric and without pulp; diabetics please choose diet or no sugar options), Carbonated beverages (diabetics please choose diet or no sugar options), Clear Tea (No creamer, milk, or cream, including half & half and powdered creamer), Black Coffee Only (No creamer, milk or cream, including half & half and powdered creamer), and Clear Sports drink (No red color; diabetics please choose diet or no sugar options)    Take these medicines the morning of surgery with A SIP OF WATER              sertraline, buspirone, gabapentin, meclizine, omeprazole, oxycodone(if needed).     Do not wear jewelry, make-up or nail polish, including gel polish,  artificial nails, or any other type of covering on natural nails (fingers and  toes).  Do not wear lotions, powders, or perfumes, or deodorant.  Do not shave 48 hours prior to surgery.  Men may shave face and neck.  Do not bring valuables to the hospital.  [facility] is not responsible for any belongings or valuables.  Contacts, dentures or  bridgework may not be worn into surgery.  Leave your suitcase in the car.  After surgery it may be brought to your room.  For patients admitted to the hospital, discharge time will be determined by your treatment team.  Patients discharged the day of surgery will not be allowed to drive home and must have someone with them for 24 hours.    Special instructions:   DO NOT smoke tobacco or vape for 24 hours before your procedure.  Please read over the following fact sheets that you were given. Anesthesia Post-op Instructions and Care and Recovery After Surgery         Upper Endoscopy, Adult, Care After After the procedure, it is common to have a sore throat. It is also common to have: Mild stomach pain or discomfort. Bloating. Nausea. Follow these instructions at home: The instructions below may help you care for yourself at home. Your health care provider may give you more instructions. If you have questions, ask your health care provider. If you were given a sedative during the procedure, it can affect you for several hours. Do not drive or operate machinery until your health care provider says that  it is safe. If you will be going home right after the procedure, plan to have a responsible adult: Take you home from the hospital or clinic. You will not be allowed to drive. Care for you for the time you are told. Follow instructions from your health care provider about what you may eat and drink. Return to your normal activities as told by your health care provider. Ask your health care provider what activities are safe for you. Take over-the-counter and prescription medicines only as told by your health care provider. Contact a health care provider if you: Have a sore throat that lasts longer than one day. Have trouble swallowing. Have a fever. Get help right away if you: Vomit blood or your vomit looks like coffee grounds. Have bloody, black, or tarry stools. Have a very bad sore  throat or you cannot swallow. Have difficulty breathing or very bad pain in your chest or abdomen. These symptoms may be an emergency. Get help right away. Call 911. Do not wait to see if the symptoms will go away. Do not drive yourself to the hospital. Summary After the procedure, it is common to have a sore throat, mild stomach discomfort, bloating, and nausea. If you were given a sedative during the procedure, it can affect you for several hours. Do not drive until your health care provider says that it is safe. Follow instructions from your health care provider about what you may eat and drink. Return to your normal activities as told by your health care provider. This information is not intended to replace advice given to you by your health care provider. Make sure you discuss any questions you have with your health care provider. Document Revised: 11/04/2021 Document Reviewed: 11/04/2021 Elsevier Patient Education  2024 [employer] Esophageal Dilatation Esophageal dilatation, also called esophageal dilation, is a procedure to widen or open a blocked or narrowed part of the esophagus. The esophagus is the part of the body that moves food and liquid from the mouth to the stomach. You may need this procedure if: You have a buildup of scar tissue in your esophagus that makes it difficult, painful, or impossible to swallow. This can be caused by gastroesophageal reflux disease (GERD). You have cancer of the esophagus. There is a problem with how food moves through your esophagus. In some cases, you may need this procedure repeated at a later time to dilate the esophagus gradually. Tell a health care provider about: Any allergies you have. All medicines you are taking, including vitamins, herbs, eye drops, creams, and over-the-counter medicines. Any problems you or family members have had with anesthetic medicines. Any blood disorders you have. Any surgeries you have had. Any medical  conditions you have. Any antibiotic medicines you are required to take before dental procedures. Whether you are pregnant or may be pregnant. What are the risks? Generally, this is a safe procedure. However, problems may occur, including: Bleeding due to a tear in the lining of the esophagus. A hole, or perforation, in the esophagus. What happens before the procedure? Ask your health care provider about: Changing or stopping your regular medicines. This is especially important if you are taking diabetes medicines or blood thinners. Taking medicines such as aspirin and ibuprofen. These medicines can thin your blood. Do not take these medicines unless your health care provider tells you to take them. Taking over-the-counter medicines, vitamins, herbs, and supplements. Follow instructions from your health care provider about eating or drinking restrictions. Plan to have a  responsible adult take you home from the hospital or clinic. Plan to have a responsible adult care for you for the time you are told after you leave the hospital or clinic. This is important. What happens during the procedure? You may be given a medicine to help you relax (sedative). A numbing medicine may be sprayed into the back of your throat, or you may gargle the medicine. Your health care provider may perform the dilatation using various surgical instruments, such as: Simple dilators. This instrument is carefully placed in the esophagus to stretch it. Guided wire bougies. This involves using an endoscope to insert a wire into the esophagus. A dilator is passed over this wire to enlarge the esophagus. Then the wire is removed. Balloon dilators. An endoscope with a small balloon is inserted into the esophagus. The balloon is inflated to stretch the esophagus and open it up. The procedure may vary among health care providers and hospitals. What can I expect after the procedure? Your blood pressure, heart rate, breathing  rate, and blood oxygen level will be monitored until you leave the hospital or clinic. Your throat may feel slightly sore and numb. This will get better over time. You will not be allowed to eat or drink until your throat is no longer numb. When you are able to drink, urinate, and sit on the edge of the bed without nausea or dizziness, you may be able to return home. Follow these instructions at home: Take over-the-counter and prescription medicines only as told by your health care provider. If you were given a sedative during the procedure, it can affect you for several hours. Do not drive or operate machinery until your health care provider says that it is safe. Plan to have a responsible adult care for you for the time you are told. This is important. Follow instructions from your health care provider about any eating or drinking restrictions. Do not use any products that contain nicotine or tobacco, such as cigarettes, e-cigarettes, and chewing tobacco. If you need help quitting, ask your health care provider. Keep all follow-up visits. This is important. Contact a health care provider if: You have a fever. You have pain that is not relieved by medicine. Get help right away if: You have chest pain. You have trouble breathing. You have trouble swallowing. You vomit blood. You have black, tarry, or bloody stools. These symptoms may represent a serious problem that is an emergency. Do not wait to see if the symptoms will go away. Get medical help right away. Call your local emergency services (911 in the [place] Do not drive yourself to the hospital. Summary Esophageal dilatation, also called esophageal dilation, is a procedure to widen or open a blocked or narrowed part of the esophagus. Plan to have a responsible adult take you home from the hospital or clinic. For this procedure, a numbing medicine may be sprayed into the back of your throat, or you may gargle the medicine. Do not drive  or operate machinery until your health care provider says that it is safe. This information is not intended to replace advice given to you by your health care provider. Make sure you discuss any questions you have with your health care provider. Document Revised: 12/12/2019 Document Reviewed: 12/12/2019 Elsevier Patient Education  2024 [employer]. Colonoscopy, Adult, Care After The following information offers guidance on how to care for yourself after your procedure. Your health care provider may also give you more specific instructions. If you have problems  or questions, contact your health care provider. What can I expect after the procedure? After the procedure, it is common to have: A small amount of blood in your stool for 24 hours after the procedure. Some gas. Mild cramping or bloating of your abdomen. Follow these instructions at home: Eating and drinking  Drink enough fluid to keep your urine pale yellow. Follow instructions from your health care provider about eating or drinking restrictions. Resume your normal diet as told by your health care provider. Avoid heavy or fried foods that are hard to digest. Activity Rest as told by your health care provider. Avoid sitting for a long time without moving. Get up to take short walks every 1-2 hours. This is important to improve blood flow and breathing. Ask for help if you feel weak or unsteady. Return to your normal activities as told by your health care provider. Ask your health care provider what activities are safe for you. Managing cramping and bloating  Try walking around when you have cramps or feel bloated. If directed, apply heat to your abdomen as told by your health care provider. Use the heat source that your health care provider recommends, such as a moist heat pack or a heating pad. Place a towel between your skin and the heat source. Leave the heat on for 20-30 minutes. Remove the heat if your skin turns bright red.  This is especially important if you are unable to feel pain, heat, or cold. You have a greater risk of getting burned. General instructions If you were given a sedative during the procedure, it can affect you for several hours. Do not drive or operate machinery until your health care provider says that it is safe. For the first 24 hours after the procedure: Do not sign important documents. Do not drink alcohol. Do your regular daily activities at a slower pace than normal. Eat soft foods that are easy to digest. Take over-the-counter and prescription medicines only as told by your health care provider. Keep all follow-up visits. This is important. Contact a health care provider if: You have blood in your stool 2-3 days after the procedure. Get help right away if: You have more than a small spotting of blood in your stool. You have large blood clots in your stool. You have swelling of your abdomen. You have nausea or vomiting. You have a fever. You have increasing pain in your abdomen that is not relieved with medicine. These symptoms may be an emergency. Get help right away. Call 911. Do not wait to see if the symptoms will go away. Do not drive yourself to the hospital. Summary After the procedure, it is common to have a small amount of blood in your stool. You may also have mild cramping and bloating of your abdomen. If you were given a sedative during the procedure, it can affect you for several hours. Do not drive or operate machinery until your health care provider says that it is safe. Get help right away if you have a lot of blood in your stool, nausea or vomiting, a fever, or increased pain in your abdomen. This information is not intended to replace advice given to you by your health care provider. Make sure you discuss any questions you have with your health care provider. Document Revised: 09/07/2022 Document Reviewed: 03/18/2021 Elsevier Patient Education  2024 Else[employer]  Inc. Monitored Anesthesia Care, Care After The following information offers guidance on how to care for yourself after your procedure. Your health  care provider may also give you more specific instructions. If you have problems or questions, contact your health care provider. What can I expect after the procedure? After the procedure, it is common to have: Tiredness. Little or no memory about what happened during or after the procedure. Impaired judgment when it comes to making decisions. Nausea or vomiting. Some trouble with balance. Follow these instructions at home: For the time period you were told by your health care provider:  Rest. Do not participate in activities where you could fall or become injured. Do not drive or use machinery. Do not drink alcohol. Do not take sleeping pills or medicines that cause drowsiness. Do not make important decisions or sign legal documents. Do not take care of children on your own. Medicines Take over-the-counter and prescription medicines only as told by your health care provider. If you were prescribed antibiotics, take them as told by your health care provider. Do not stop using the antibiotic even if you start to feel better. Eating and drinking Follow instructions from your health care provider about what you may eat and drink. Drink enough fluid to keep your urine pale yellow. If you vomit: Drink clear fluids slowly and in small amounts as you are able. Clear fluids include water, ice chips, low-calorie sports drinks, and fruit juice that has water added to it (diluted fruit juice). Eat light and bland foods in small amounts as you are able. These foods include bananas, applesauce, rice, lean meats, toast, and crackers. General instructions  Have a responsible adult stay with you for the time you are told. It is important to have someone help care for you until you are awake and alert. If you have sleep apnea, surgery and some medicines  can increase your risk for breathing problems. Follow instructions from your health care provider about wearing your sleep device: When you are sleeping. This includes during daytime naps. While taking prescription pain medicines, sleeping medicines, or medicines that make you drowsy. Do not use any products that contain nicotine or tobacco. These products include cigarettes, chewing tobacco, and vaping devices, such as e-cigarettes. If you need help quitting, ask your health care provider. Contact a health care provider if: You feel nauseous or vomit every time you eat or drink. You feel light-headed. You are still sleepy or having trouble with balance after 24 hours. You get a rash. You have a fever. You have redness or swelling around the IV site. Get help right away if: You have trouble breathing. You have new confusion after you get home. These symptoms may be an emergency. Get help right away. Call 911. Do not wait to see if the symptoms will go away. Do not drive yourself to the hospital. This information is not intended to replace advice given to you by your health care provider. Make sure you discuss any questions you have with your health care provider. Document Revised: 12/21/2021 Document Reviewed: 12/21/2021 Elsevier Patient Education  2024 [employer].

## 2023-07-22 ENCOUNTER — Encounter (HOSPITAL_COMMUNITY): Payer: Self-pay

## 2023-07-22 ENCOUNTER — Encounter (HOSPITAL_COMMUNITY)
Admission: RE | Admit: 2023-07-22 | Discharge: 2023-07-22 | Disposition: A | Discharge status: Home / Self Care | Payer: Medicare Other | Source: Ambulatory Visit | Attending: Internal Medicine | Admitting: Internal Medicine

## 2023-07-22 VITALS — BP 103/71 | HR 85 | Temp 97.8°F | Resp 18 | Ht 66.0 in | Wt 200.2 lb

## 2023-07-22 DIAGNOSIS — I10 Essential (primary) hypertension: Secondary | ICD-10-CM | POA: Diagnosis not present

## 2023-07-22 DIAGNOSIS — Z01818 Encounter for other preprocedural examination: Secondary | ICD-10-CM | POA: Diagnosis present

## 2023-07-22 DIAGNOSIS — E1140 Type 2 diabetes mellitus with diabetic neuropathy, unspecified: Secondary | ICD-10-CM | POA: Diagnosis not present

## 2023-07-22 HISTORY — DX: Cardiac arrhythmia, unspecified: I49.9

## 2023-07-22 HISTORY — DX: Unspecified atrial fibrillation: I48.91

## 2023-07-22 LAB — BASIC METABOLIC PANEL
Anion gap: 9 (ref 5–15)
BUN: 9 mg/dL (ref 8–23)
CO2: 25 mmol/L (ref 22–32)
Calcium: 9 mg/dL (ref 8.9–10.3)
Chloride: 103 mmol/L (ref 98–111)
Creatinine, Ser: 0.53 mg/dL (ref 0.44–1.00)
GFR, Estimated: >60 mL/min (ref >60)
Glucose, Bld: 343 mg/dL — ABNORMAL HIGH (ref 70–99)
Potassium: 3.5 mmol/L (ref 3.5–5.1)
Sodium: 137 mmol/L (ref 135–145)

## 2023-07-25 ENCOUNTER — Encounter (HOSPITAL_COMMUNITY): Payer: Medicare Other

## 2023-07-25 ENCOUNTER — Ambulatory Visit (HOSPITAL_COMMUNITY): Payer: Medicare Other | Admitting: Anesthesiology

## 2023-07-25 ENCOUNTER — Other Ambulatory Visit: Payer: Self-pay

## 2023-07-25 ENCOUNTER — Encounter (HOSPITAL_COMMUNITY): Payer: Self-pay

## 2023-07-25 ENCOUNTER — Encounter (HOSPITAL_COMMUNITY)
Admission: RE | Disposition: A | Discharge status: Home / Self Care | Payer: Self-pay | Source: Home / Self Care | Attending: Internal Medicine

## 2023-07-25 ENCOUNTER — Ambulatory Visit (HOSPITAL_COMMUNITY)
Admission: RE | Admit: 2023-07-25 | Discharge: 2023-07-25 | Disposition: A | Discharge status: Home / Self Care | Payer: Medicare Other | Attending: Internal Medicine | Admitting: Internal Medicine

## 2023-07-25 DIAGNOSIS — E119 Type 2 diabetes mellitus without complications: Secondary | ICD-10-CM | POA: Insufficient documentation

## 2023-07-25 DIAGNOSIS — K2950 Unspecified chronic gastritis without bleeding: Secondary | ICD-10-CM | POA: Diagnosis not present

## 2023-07-25 DIAGNOSIS — Z7985 Long-term (current) use of injectable non-insulin antidiabetic drugs: Secondary | ICD-10-CM | POA: Insufficient documentation

## 2023-07-25 DIAGNOSIS — K5730 Diverticulosis of large intestine without perforation or abscess without bleeding: Secondary | ICD-10-CM | POA: Insufficient documentation

## 2023-07-25 DIAGNOSIS — D124 Benign neoplasm of descending colon: Secondary | ICD-10-CM | POA: Diagnosis not present

## 2023-07-25 DIAGNOSIS — K529 Noninfective gastroenteritis and colitis, unspecified: Secondary | ICD-10-CM | POA: Diagnosis not present

## 2023-07-25 DIAGNOSIS — K2289 Other specified disease of esophagus: Secondary | ICD-10-CM | POA: Insufficient documentation

## 2023-07-25 DIAGNOSIS — Z794 Long term (current) use of insulin: Secondary | ICD-10-CM | POA: Insufficient documentation

## 2023-07-25 DIAGNOSIS — K644 Residual hemorrhoidal skin tags: Secondary | ICD-10-CM | POA: Insufficient documentation

## 2023-07-25 DIAGNOSIS — Z7984 Long term (current) use of oral hypoglycemic drugs: Secondary | ICD-10-CM | POA: Insufficient documentation

## 2023-07-25 DIAGNOSIS — K2970 Gastritis, unspecified, without bleeding: Secondary | ICD-10-CM | POA: Diagnosis not present

## 2023-07-25 DIAGNOSIS — K219 Gastro-esophageal reflux disease without esophagitis: Secondary | ICD-10-CM | POA: Insufficient documentation

## 2023-07-25 DIAGNOSIS — I10 Essential (primary) hypertension: Secondary | ICD-10-CM | POA: Diagnosis not present

## 2023-07-25 DIAGNOSIS — R1310 Dysphagia, unspecified: Secondary | ICD-10-CM | POA: Diagnosis not present

## 2023-07-25 DIAGNOSIS — R1032 Left lower quadrant pain: Secondary | ICD-10-CM | POA: Insufficient documentation

## 2023-07-25 DIAGNOSIS — R12 Heartburn: Secondary | ICD-10-CM | POA: Insufficient documentation

## 2023-07-25 DIAGNOSIS — D123 Benign neoplasm of transverse colon: Secondary | ICD-10-CM | POA: Insufficient documentation

## 2023-07-25 HISTORY — PX: BIOPSY: SHX5522

## 2023-07-25 HISTORY — PX: COLONOSCOPY WITH PROPOFOL: SHX5780

## 2023-07-25 HISTORY — PX: POLYPECTOMY: SHX149

## 2023-07-25 HISTORY — PX: ESOPHAGOGASTRODUODENOSCOPY (EGD) WITH PROPOFOL: SHX5813

## 2023-07-25 HISTORY — PX: ESOPHAGEAL BRUSHING: SHX6842

## 2023-07-25 LAB — KOH PREP: KOH Prep: NONE SEEN

## 2023-07-25 LAB — GLUCOSE, CAPILLARY: Glucose-Capillary: 289 mg/dL — ABNORMAL HIGH (ref 70–99)

## 2023-07-25 SURGERY — COLONOSCOPY WITH PROPOFOL
Anesthesia: General

## 2023-07-25 MED ORDER — LIDOCAINE HCL (PF) 2 % IJ SOLN
INTRAMUSCULAR | Status: AC
  Filled 2023-07-25: qty 5

## 2023-07-25 MED ORDER — PHENYLEPHRINE 80 MCG/ML (10ML) SYRINGE FOR IV PUSH (FOR BLOOD PRESSURE SUPPORT)
PREFILLED_SYRINGE | INTRAVENOUS | Status: DC | PRN
  Administered 2023-07-25 (×2): 160 ug via INTRAVENOUS

## 2023-07-25 MED ORDER — LIDOCAINE HCL (CARDIAC) PF 100 MG/5ML IV SOSY
PREFILLED_SYRINGE | INTRAVENOUS | Status: DC | PRN
  Administered 2023-07-25: 60 mg via INTRAVENOUS

## 2023-07-25 MED ORDER — PROPOFOL 10 MG/ML IV BOLUS
INTRAVENOUS | Status: DC | PRN
  Administered 2023-07-25: 50 mg via INTRAVENOUS
  Administered 2023-07-25: 100 mg via INTRAVENOUS
  Administered 2023-07-25 (×2): 30 mg via INTRAVENOUS
  Administered 2023-07-25: 70 mg via INTRAVENOUS
  Administered 2023-07-25: 20 mg via INTRAVENOUS

## 2023-07-25 MED ORDER — PROPOFOL 1000 MG/100ML IV EMUL
INTRAVENOUS | Status: AC
  Filled 2023-07-25: qty 100

## 2023-07-25 MED ORDER — LACTATED RINGERS IV SOLN: INTRAVENOUS | Status: DC | PRN

## 2023-07-25 MED ORDER — PROPOFOL 500 MG/50ML IV EMUL
INTRAVENOUS | Status: AC
  Filled 2023-07-25: qty 50

## 2023-07-25 MED ORDER — PHENYLEPHRINE 80 MCG/ML (10ML) SYRINGE FOR IV PUSH (FOR BLOOD PRESSURE SUPPORT)
PREFILLED_SYRINGE | INTRAVENOUS | Status: AC
  Filled 2023-07-25: qty 10

## 2023-07-25 MED ORDER — PROPOFOL 500 MG/50ML IV EMUL
INTRAVENOUS | Status: DC | PRN
  Administered 2023-07-25: 150 ug/kg/min via INTRAVENOUS

## 2023-07-25 NOTE — Progress Notes (Signed)
Anesthesia notified that blood glucose was 289. No new orders given.

## 2023-07-25 NOTE — Op Note (Signed)
[facility] Patient Name: Amy Livingston Procedure Date: 07/25/2023 10:01 AM MRN: [id number] Date of Birth: Dec 27, 1955 Attending MD: Hennie Duos. Marletta Lor , [place], [id number] CSN: [id number] Age: 67 Admit Type: Outpatient Procedure:                Upper GI endoscopy Indications:              Dysphagia, Heartburn Providers:                Hennie Duos. Marletta Lor, DO, Crystal Page, Lennice Sites                            Technician, Technician Referring MD:              Medicines:                See the Anesthesia note for documentation of the                            administered medications Complications:            No immediate complications. Estimated Blood Loss:     Estimated blood loss was minimal. Procedure:                Pre-Anesthesia Assessment:                           - The anesthesia plan was to use monitored                            anesthesia care (MAC).                           After obtaining informed consent, the endoscope was                            passed under direct vision. Throughout the                            procedure, the patient's blood pressure, pulse, and                            oxygen saturations were monitored continuously. The                            [id number]-H190 ([id number]) scope was introduced through the                            mouth, and advanced to the second part of duodenum.                            The upper GI endoscopy was accomplished without                            difficulty. The patient tolerated the procedure                            well. Scope In: 10:16:13 AM  Scope Out: 10:20:31 AM Total Procedure Duration: 0 hours 4 minutes 18 seconds  Findings:      The Z-line was regular and was found 38 cm from the incisors.      White nummular lesions were noted in the middle third of the esophagus.       Cells for cytology were obtained by brushing.      Diffuse mild inflammation characterized by erythema was found in the        entire examined stomach. Biopsies were taken with a cold forceps for       Helicobacter pylori testing.      The duodenal bulb, first portion of the duodenum and second portion of       the duodenum were normal. Impression:               - Z-line regular, 38 cm from the incisors.                           - White nummular lesions in esophageal mucosa.                            Cells for cytology obtained.                           - Gastritis. Biopsied.                           - Normal duodenal bulb, first portion of the                            duodenum and second portion of the duodenum. Moderate Sedation:      Per Anesthesia Care Recommendation:           - Patient has a contact number available for                            emergencies. The signs and symptoms of potential                            delayed complications were discussed with the                            patient. Return to normal activities tomorrow.                            Written discharge instructions were provided to the                            patient.                           - Resume previous diet.                           - Continue present medications.                           - Await pathology results.                           -  Use a proton pump inhibitor PO BID for 12 weeks. Procedure Code(s):        --- Professional ---                           [phone], Esophagogastroduodenoscopy, flexible,                            transoral; with biopsy, single or multiple Diagnosis Code(s):        --- Professional ---                           K22.89, Other specified disease of esophagus                           K29.70, Gastritis, unspecified, without bleeding                           R13.10, Dysphagia, unspecified                           R12, Heartburn CPT copyright 2022 American [employer] Association. All rights reserved. The codes documented in this report are preliminary and upon coder review  may  be revised to meet current compliance requirements. Hennie Duos. Marletta Lor, DO Hennie Duos. Marletta Lor, DO 07/25/2023 10:22:40 AM This report has been signed electronically. Number of Addenda: 0

## 2023-07-25 NOTE — Transfer of Care (Signed)
Immediate Anesthesia Transfer of Care Note  Patient: Amy Livingston  Procedure(s) Performed: COLONOSCOPY WITH PROPOFOL ESOPHAGOGASTRODUODENOSCOPY (EGD) WITH PROPOFOL ESOPHAGEAL BRUSHING BIOPSY POLYPECTOMY INTESTINAL  Patient Location: Short Stay  Anesthesia Type:General  Level of Consciousness: drowsy and patient cooperative  Airway & Oxygen Therapy: Patient Spontanous Breathing and Patient connected to nasal cannula oxygen  Post-op Assessment: Report given to RN and Post -op Vital signs reviewed and stable  Post vital signs: Reviewed and stable  Last Vitals:  Vitals Value Taken Time  BP 138/63 07/25/23 1047  Temp 36.2 C 07/25/23 1047  Pulse 80 07/25/23 1047  Resp 17 07/25/23 1047  SpO2 97 % 07/25/23 1047    Last Pain:  Vitals:   07/25/23 1047  TempSrc: Axillary  PainSc: 0-No pain      Patients Stated Pain Goal: 7 (07/25/23 0932)  Complications: No notable events documented.

## 2023-07-25 NOTE — Op Note (Signed)
[facility] Patient Name: Amy Livingston Procedure Date: 07/25/2023 10:00 AM MRN: [id number] Date of Birth: 23-Dec-1955 Attending MD: Hennie Duos. Marletta Lor , [place], [id number] CSN: [id number] Age: 67 Admit Type: Outpatient Procedure:                Colonoscopy Indications:              Abdominal pain in the left lower quadrant, Chronic                            diarrhea Providers:                Hennie Duos. Marletta Lor, DO, Crystal Page, Lennice Sites                            Technician, Technician Referring MD:              Medicines:                See the Anesthesia note for documentation of the                            administered medications Complications:            No immediate complications. Estimated Blood Loss:     Estimated blood loss was minimal. Procedure:                Pre-Anesthesia Assessment:                           - The anesthesia plan was to use monitored                            anesthesia care (MAC).                           After obtaining informed consent, the colonoscope                            was passed under direct vision. Throughout the                            procedure, the patient's blood pressure, pulse, and                            oxygen saturations were monitored continuously. The                            PCF-HQ190L ([id number]) scope was introduced through                            the anus and advanced to the the terminal ileum,                            with identification of the appendiceal orifice and                            IC valve. The colonoscopy was performed without  difficulty. The patient tolerated the procedure                            well. The quality of the bowel preparation was                            evaluated using the BBPS [facility] Bowel Preparation                            Scale) with scores of: Right Colon = 3, Transverse                            Colon = 3 and Left Colon = 3  (entire mucosa seen                            well with no residual staining, small fragments of                            stool or opaque liquid). The total BBPS score                            equals 9. Scope In: 10:24:22 AM Scope Out: 10:40:32 AM Scope Withdrawal Time: 0 hours 12 minutes 30 seconds  Total Procedure Duration: 0 hours 16 minutes 10 seconds  Findings:      Moderate hemorrhoids were found on perianal exam.      Scattered small-mouthed diverticula were found in the sigmoid colon and       transverse colon.      Three sessile polyps were found in the descending colon and transverse       colon. The polyps were 4 to 6 mm in size. These polyps were removed with       a cold snare. Resection and retrieval were complete.      The terminal ileum appeared normal.      Biopsies for histology were taken with a cold forceps from the ascending       colon, transverse colon and descending colon for evaluation of       microscopic colitis.      The exam was otherwise without abnormality. Impression:               - Hemorrhoids found on perianal exam.                           - Diverticulosis in the sigmoid colon and in the                            transverse colon.                           - Three 4 to 6 mm polyps in the descending colon                            and in the transverse colon, removed with a cold  snare. Resected and retrieved.                           - The examined portion of the ileum was normal.                           - The examination was otherwise normal.                           - Biopsies were taken with a cold forceps from the                            ascending colon, transverse colon and descending                            colon for evaluation of microscopic colitis. Moderate Sedation:      Per Anesthesia Care Recommendation:           - Patient has a contact number available for                             emergencies. The signs and symptoms of potential                            delayed complications were discussed with the                            patient. Return to normal activities tomorrow.                            Written discharge instructions were provided to the                            patient.                           - Resume previous diet.                           - Continue present medications.                           - Await pathology results.                           - Repeat colonoscopy in 5 years for surveillance.                           - Return to GI clinic in 3 months. Procedure Code(s):        --- Professional ---                           [phone], Colonoscopy, flexible; with removal of                            tumor(s), polyp(s), or other lesion(s) by  snare                            technique                           45380, 59, Colonoscopy, flexible; with biopsy,                            single or multiple Diagnosis Code(s):        --- Professional ---                           K64.9, Unspecified hemorrhoids                           D12.4, Benign neoplasm of descending colon                           D12.3, Benign neoplasm of transverse colon (hepatic                            flexure or splenic flexure)                           R10.32, Left lower quadrant pain                           K52.9, Noninfective gastroenteritis and colitis,                            unspecified                           K57.30, Diverticulosis of large intestine without                            perforation or abscess without bleeding CPT copyright 2022 American Medical Association. All rights reserved. The codes documented in this report are preliminary and upon coder review may  be revised to meet current compliance requirements. Hennie Duos. Marletta Lor, DO Hennie Duos. Marletta Lor, DO 07/25/2023 10:45:24 AM This report has been signed electronically. Number of Addenda: 0

## 2023-07-25 NOTE — H&P (Signed)
Primary Care Physician:  Gilmore Laroche, FNP Primary Gastroenterologist:  Dr. Marletta Lor  Pre-Procedure History & Physical: HPI:  Amy Livingston is a 67 y.o. female is here for an EGD with possible dilation due to history of dysphagia, GERD and colonoscopy for chronic diarrhea, lower abdominal pain, mucus in stool   Past Medical History:  Diagnosis Date   Anxiety    Atrial fibrillation (HCC)    Diabetes mellitus without complication (HCC)    Dysrhythmia    GERD (gastroesophageal reflux disease)    Hypertension    PONV (postoperative nausea and vomiting)    Shortness of breath dyspnea     Past Surgical History:  Procedure Laterality Date   ABDOMINAL HYSTERECTOMY     ANTERIOR CERVICAL DECOMP/DISCECTOMY FUSION N/A 01/13/2016   Procedure: C5-6 ANTERIOR CERVICAL DECOMPRESSION/DISKECTOMY/FUSION;  Surgeon: Barnett Abu, MD;  Location: [facility];  Service: Neurosurgery;  Laterality: N/A;  C5-6 ANTERIOR CERVICAL DECOMPRESSION/DISKECTOMY/FUSION   BACK SURGERY     x 5   CHOLECYSTECTOMY     FOOT SURGERY     KNEE ARTHROSCOPY     left   KNEE SURGERY Right    TUBAL LIGATION      Prior to Admission medications   Medication Sig Start Date End Date Taking? Authorizing Provider  atorvastatin (LIPITOR) 80 MG tablet Take 1 tablet (80 mg total) by mouth daily. 06/07/23  Yes Gilmore Laroche, FNP  busPIRone (BUSPAR) 10 MG tablet Take 1 tablet (10 mg total) by mouth 2 (two) times daily. 06/16/23  Yes Gilmore Laroche, FNP  Cholecalciferol (VITAMIN D) 50 MCG (2000 UT) CAPS Take by mouth.   Yes [provider]  gabapentin (NEURONTIN) 300 MG capsule Take 1 capsule (300 mg total) by mouth 3 (three) times daily. 06/16/23  Yes Gilmore Laroche, FNP  insulin aspart (NOVOLOG) 100 UNIT/ML injection ADMINISTER 20 UNITS UNDER THE SKIN THREE TIMES DAILY BEFORE MEALS 02/10/22  Yes [provider]  lisinopril (ZESTRIL) 5 MG tablet Take 1 tablet (5 mg total) by mouth daily. 06/07/23  Yes Gilmore Laroche,  FNP  metFORMIN (GLUCOPHAGE) 1000 MG tablet Take 1 tablet (1,000 mg total) by mouth 2 (two) times daily with a meal. 06/07/23  Yes Gilmore Laroche, FNP  metoprolol tartrate (LOPRESSOR) 50 MG tablet Take 50 mg by mouth 2 (two) times daily. 01/21/20  Yes [provider]  mirabegron ER (MYRBETRIQ) 25 MG TB24 tablet Take 1 tablet (25 mg total) by mouth daily. 06/16/23  Yes Gilmore Laroche, FNP  omeprazole (PRILOSEC) 40 MG capsule Take 40 mg by mouth daily.     Yes [provider]  oxyCODONE-acetaminophen (PERCOCET/ROXICET) 5-325 MG tablet Take 1-2 tablets by mouth every 4 (four) hours as needed for moderate pain. Patient taking differently: Take 1-2 tablets by mouth every 8 (eight) hours as needed for moderate pain (pain score 4-6). 01/14/16  Yes Elsner, Sherilyn Cooter, MD  RESTASIS 0.05 % ophthalmic emulsion 1 drop 2 (two) times daily. 02/11/20  Yes [provider]  sertraline (ZOLOFT) 50 MG tablet Take 1 tablet (50 mg total) by mouth daily. 06/16/23  Yes Gilmore Laroche, FNP  BD INSULIN SYRINGE U/F 31G X 5/16" 1 ML MISC USE FOR INSULIN 06/09/23   [provider]  Blood Glucose Monitoring Suppl DEVI 1 each by Does not apply route in the morning, at noon, and at bedtime. May substitute to any manufacturer covered by patient's insurance. 06/07/23   Gilmore Laroche, FNP  Continuous Glucose Sensor (FREESTYLE LIBRE 3 SENSOR) MISC Place 1 sensor on the skin  every 14 days. Use to check glucose continuously 06/26/23   Gilmore Laroche, FNP  ELIQUIS 5 MG TABS tablet Take 5 mg by mouth 2 (two) times daily.    [provider]  insulin glargine (LANTUS) 100 UNIT/ML injection Inject 0.1 mLs (10 Units total) into the skin at bedtime. 06/16/23   Gilmore Laroche, FNP  Insulin Pen Needle (NOVOFINE PEN NEEDLE) 32G X 6 MM MISC USE TO INJECT INSULIN FOUR TIMES DAILY 02/15/22   [provider]  meclizine (ANTIVERT) 25 MG tablet Take 25 mg by mouth daily as needed for dizziness.     [provider]  tirzepatide Greggory Keen) 2.5 MG/0.5ML Pen Inject 2.5 mg into the skin once a week. 06/07/23   Gilmore Laroche, FNP    Allergies as of 06/09/2023 - Review Complete 06/09/2023  Allergen Reaction Noted   Elemental sulfur Itching and Rash 12/30/2015    History reviewed. No pertinent family history.  Social History   Socioeconomic History   Marital status: Divorced    Spouse name: Not on file   Number of children: Not on file   Years of education: Not on file   Highest education level: Some college, no degree  Occupational History   Not on file  Tobacco Use   Smoking status: Never   Smokeless tobacco: Never  Substance and Sexual Activity   Alcohol use: No   Drug use: No   Sexual activity: Not on file  Other Topics Concern   Not on file  Social History Narrative   Not on file   Social Drivers of Health   Financial Resource Strain: Low Risk  (05/29/2023)   Overall Financial Resource Strain (CARDIA)    Difficulty of Paying Living Expenses: Not very hard  Food Insecurity: Food Insecurity Present (05/29/2023)   Hunger Vital Sign    Worried About Running Out of Food in the Last Year: Sometimes true    Ran Out of Food in the Last Year: Never true  Transportation Needs: No Transportation Needs (05/29/2023)   PRAPARE - Administrator, Civil Service (Medical): No    Lack of Transportation (Non-Medical): No  Physical Activity: Unknown (05/29/2023)   Exercise Vital Sign    Days of Exercise per Week: 0 days    Minutes of Exercise per Session: Not on file  Stress: Stress Concern Present (05/29/2023)   [employer] of Occupational Health - Occupational Stress Questionnaire    Feeling of Stress : Very much  Social Connections: Socially Isolated (05/29/2023)   Social Connection and Isolation Panel [NHANES]    Frequency of Communication with Friends and Family: More than three times a week    Frequency of Social Gatherings with Friends and Family: Never     Attends Religious Services: Never    Database administrator or Organizations: No    Attends Engineer, structural: Not on file    Marital Status: Divorced  Intimate Partner Violence: Not At Risk (02/25/2022)   Received [facility], [facility]   Humiliation, Afraid, Rape, and Kick questionnaire    Fear of Current or Ex-Partner: No    Emotionally Abused: No    Physically Abused: No    Sexually Abused: No    Review of Systems: General: Negative for fever, chills, fatigue, weakness. Eyes: Negative for vision changes.  ENT: Negative for hoarseness, difficulty swallowing , nasal congestion. CV: Negative for chest pain, angina, palpitations, dyspnea on exertion, peripheral edema.  Respiratory: Negative for dyspnea  at rest, dyspnea on exertion, cough, sputum, wheezing.  GI: See history of present illness. GU:  Negative for dysuria, hematuria, urinary incontinence, urinary frequency, nocturnal urination.  MS: Negative for joint pain, low back pain.  Derm: Negative for rash or itching.  Neuro: Negative for weakness, abnormal sensation, seizure, frequent headaches, memory loss, confusion.  Psych: Negative for anxiety, depression Endo: Negative for unusual weight change.  Heme: Negative for bruising or bleeding. Allergy: Negative for rash or hives.  Physical Exam: Vital signs in last 24 hours:     General:   Alert,  Well-developed, well-nourished, pleasant and cooperative in NAD Head:  Normocephalic and atraumatic. Eyes:  Sclera clear, no icterus.   Conjunctiva pink. Ears:  Normal auditory acuity. Nose:  No deformity, discharge,  or lesions. Msk:  Symmetrical without gross deformities. Normal posture. Extremities:  Without clubbing or edema. Neurologic:  Alert and  oriented x4;  grossly normal neurologically. Skin:  Intact without significant lesions or rashes. Psych:  Alert and cooperative. Normal mood and affect.   Impression/Plan: Amy Livingston is here  for an EGD with possible dilation due to history of dysphagia, GERD and colonoscopy for chronic diarrhea, lower abdominal pain, mucus in stool   Risks, benefits, limitations, imponderables and alternatives regarding procedure have been reviewed with the patient. Questions have been answered. All parties agreeable.

## 2023-07-25 NOTE — Discharge Instructions (Addendum)
EGD Discharge instructions Please read the instructions outlined below and refer to this sheet in the next few weeks. These discharge instructions provide you with general information on caring for yourself after you leave the hospital. Your doctor may also give you specific instructions. While your treatment has been planned according to the most current medical practices available, unavoidable complications occasionally occur. If you have any problems or questions after discharge, please call your doctor. ACTIVITY You may resume your regular activity but move at a slower pace for the next 24 hours.  Take frequent rest periods for the next 24 hours.  Walking will help expel (get rid of) the air and reduce the bloated feeling in your abdomen.  No driving for 24 hours (because of the anesthesia (medicine) used during the test).  You may shower.  Do not sign any important legal documents or operate any machinery for 24 hours (because of the anesthesia used during the test).  NUTRITION Drink plenty of fluids.  You may resume your normal diet.  Begin with a light meal and progress to your normal diet.  Avoid alcoholic beverages for 24 hours or as instructed by your caregiver.  MEDICATIONS You may resume your normal medications unless your caregiver tells you otherwise.  WHAT YOU CAN EXPECT TODAY You may experience abdominal discomfort such as a feeling of fullness or "gas" pains.  FOLLOW-UP Your doctor will discuss the results of your test with you.  SEEK IMMEDIATE MEDICAL ATTENTION IF ANY OF THE FOLLOWING OCCUR: Excessive nausea (feeling sick to your stomach) and/or vomiting.  Severe abdominal pain and distention (swelling).  Trouble swallowing.  Temperature over 101 F (37.8 C).  Rectal bleeding or vomiting of blood.     Colonoscopy Discharge Instructions  Read the instructions outlined below and refer to this sheet in the next few weeks. These discharge instructions provide you with  general information on caring for yourself after you leave the hospital. Your doctor may also give you specific instructions. While your treatment has been planned according to the most current medical practices available, unavoidable complications occasionally occur.   ACTIVITY You may resume your regular activity, but move at a slower pace for the next 24 hours.  Take frequent rest periods for the next 24 hours.  Walking will help get rid of the air and reduce the bloated feeling in your belly (abdomen).  No driving for 24 hours (because of the medicine (anesthesia) used during the test).   Do not sign any important legal documents or operate any machinery for 24 hours (because of the anesthesia used during the test).  NUTRITION Drink plenty of fluids.  You may resume your normal diet as instructed by your doctor.  Begin with a light meal and progress to your normal diet. Heavy or fried foods are harder to digest and may make you feel sick to your stomach (nauseated).  Avoid alcoholic beverages for 24 hours or as instructed.  MEDICATIONS You may resume your normal medications unless your doctor tells you otherwise.  WHAT YOU CAN EXPECT TODAY Some feelings of bloating in the abdomen.  Passage of more gas than usual.  Spotting of blood in your stool or on the toilet paper.  IF YOU HAD POLYPS REMOVED DURING THE COLONOSCOPY: No aspirin products for 7 days or as instructed.  No alcohol for 7 days or as instructed.  Eat a soft diet for the next 24 hours.  FINDING OUT THE RESULTS OF YOUR TEST Not all test results are  available during your visit. If your test results are not back during the visit, make an appointment with your caregiver to find out the results. Do not assume everything is normal if you have not heard from your caregiver or the medical facility. It is important for you to follow up on all of your test results.  SEEK IMMEDIATE MEDICAL ATTENTION IF: You have more than a spotting of  blood in your stool.  Your belly is swollen (abdominal distention).  You are nauseated or vomiting.  You have a temperature over 101.  You have abdominal pain or discomfort that is severe or gets worse throughout the day.   Your EGD revealed mild amount inflammation in your stomach.  I took biopsies of this to rule out infection with a bacteria called H. pylori.  Also took samples of your esophagus.  Your esophagus was wide open, I did not need to stretch it today.  Small bowel appeared normal.  I am going to increase your omeprazole to twice daily for the next 12 weeks.  Overall, your colon appeared very healthy.  I did not see any active inflammation indicative of underlying inflammatory bowel disease such as Crohn's disease or ulcerative colitis throughout your colon or end portion of your small bowel.  I took biopsies of your colon to further evaluate.    Your colonoscopy revealed 3 polyp(s) which I removed successfully. Await pathology results, my office will contact you. I recommend repeating colonoscopy in 5 years for surveillance purposes.   You also have diverticulosis and external hemorrhoids. I would recommend increasing fiber in your diet or adding OTC Benefiber/Metamucil. Be sure to drink at least 4 to 6 glasses of water daily.   Follow-up with GI in 2-3 months    I hope you have a great rest of your week!  Hennie Duos. Marletta Lor, D.O. Gastroenterology and Hepatology [facility]

## 2023-07-25 NOTE — Anesthesia Preprocedure Evaluation (Signed)
 Anesthesia Evaluation  Patient identified by MRN, date of birth, ID band Patient awake    Reviewed: Allergy & Precautions, H&P , NPO status , Patient's Chart, lab work & pertinent test results, reviewed documented beta blocker date and time   Airway Mallampati: II  TM Distance: >3 FB Neck ROM: full    Dental no notable dental hx.    Pulmonary neg pulmonary ROS   Pulmonary exam normal breath sounds clear to auscultation       Cardiovascular Exercise Tolerance: Good hypertension, negative cardio ROS  Rhythm:regular Rate:Normal     Neuro/Psych negative neurological ROS  negative psych ROS   GI/Hepatic negative GI ROS, Neg liver ROS,,,  Endo/Other  negative endocrine ROSdiabetes    Renal/GU negative Renal ROS  negative genitourinary   Musculoskeletal   Abdominal   Peds  Hematology negative hematology ROS (+)   Anesthesia Other Findings   Reproductive/Obstetrics negative OB ROS                             Anesthesia Physical Anesthesia Plan  ASA: 3  Anesthesia Plan: General   Post-op Pain Management:    Induction:   PONV Risk Score and Plan: Propofol infusion  Airway Management Planned:   Additional Equipment:   Intra-op Plan:   Post-operative Plan:   Informed Consent: I have reviewed the patients History and Physical, chart, labs and discussed the procedure including the risks, benefits and alternatives for the proposed anesthesia with the patient or authorized representative who has indicated his/her understanding and acceptance.     Dental Advisory Given  Plan Discussed with: CRNA  Anesthesia Plan Comments:        Anesthesia Quick Evaluation

## 2023-07-26 LAB — SURGICAL PATHOLOGY

## 2023-07-27 ENCOUNTER — Telehealth: Payer: Self-pay

## 2023-07-27 ENCOUNTER — Telehealth: Payer: Self-pay | Admitting: Family Medicine

## 2023-07-27 ENCOUNTER — Encounter (HOSPITAL_COMMUNITY): Payer: Medicare Other | Admitting: Physical Therapy

## 2023-07-27 ENCOUNTER — Other Ambulatory Visit: Payer: Self-pay | Admitting: Pharmacist

## 2023-07-27 NOTE — Progress Notes (Signed)
Care Guide Pharmacy Note  07/27/2023 Name: Amy Livingston MRN: [id number] DOB: [birth year]  Referred By: Gilmore Laroche, FNP Reason for referral: Care Coordination (Outreach to schedule with Pharm d due to being out of office will mail application for Lantus )   Amy Livingston is a 67 y.o. year old female who is a primary care patient of Gilmore Laroche, FNP.  Amy Livingston was referred to the pharmacist for assistance related to: DMII  Successful contact was made with the patient to discuss pharmacy services including being ready for the pharmacist to call at least 5 minutes before the scheduled appointment time and to have medication bottles and any blood pressure readings ready for review. The patient agreed to meet with the pharmacist via telephone visit on (date/time).08/31/2023  Penne Lash , RMA     [place]  [facility], [facility] Guide  Direct Dial: [phone]  Website: [email or web address]

## 2023-07-27 NOTE — Telephone Encounter (Unsigned)
Copied from CRM [id number]. Topic: Clinical - Prescription Issue >> Jul 27, 2023  1:08 PM Ivette P wrote: Reason for CRM: Lucy from [facility] is calling due to a prescription correction, and notes need to be amended. Callback please [phone]. Request was submitted 12/05  and has not received response.

## 2023-07-27 NOTE — Progress Notes (Signed)
Care Guide Pharmacy Note  07/27/2023 Name: Amy Livingston MRN: [id number] DOB: 06/20/1956  Referred By: Gilmore Laroche, FNP Reason for referral: Care Coordination (Outreach to schedule with Pharm d )   Amy Livingston is a 67 y.o. year old female who is a primary care patient of Gilmore Laroche, FNP.  Amy Livingston was referred to the pharmacist for assistance related to: DMII  An unsuccessful telephone outreach was attempted today to contact the patient who was referred to the pharmacy team for assistance with medication assistance. Additional attempts will be made to contact the patient.  Penne Lash , RMA     [facility]  [facility], [facility] Guide  Direct Dial: [phone]  Website: [email or web address]

## 2023-07-28 ENCOUNTER — Ambulatory Visit: Payer: Medicare Other | Admitting: Nutrition

## 2023-07-29 ENCOUNTER — Encounter (HOSPITAL_COMMUNITY): Payer: Self-pay | Admitting: Internal Medicine

## 2023-08-02 NOTE — Anesthesia Postprocedure Evaluation (Signed)
Anesthesia Post Note  Patient: Sarina Fout  Procedure(s) Performed: COLONOSCOPY WITH PROPOFOL ESOPHAGOGASTRODUODENOSCOPY (EGD) WITH PROPOFOL ESOPHAGEAL BRUSHING BIOPSY POLYPECTOMY INTESTINAL  Patient location during evaluation: Phase II Anesthesia Type: General Level of consciousness: awake Pain management: pain level controlled Vital Signs Assessment: post-procedure vital signs reviewed and stable Respiratory status: spontaneous breathing and respiratory function stable Cardiovascular status: blood pressure returned to baseline and stable Postop Assessment: no headache and no apparent nausea or vomiting Anesthetic complications: no Comments: Late entry   No notable events documented.   Last Vitals:  Vitals:   07/25/23 0932 07/25/23 1047  BP: (!) 152/71 138/63  Pulse: 91 80  Resp: 16 17  Temp: 36.4 C (!) 36.2 C  SpO2: 100% 97%    Last Pain:  Vitals:   07/25/23 1047  TempSrc: Axillary  PainSc: 0-No pain                 Windell [place]

## 2023-08-05 NOTE — Telephone Encounter (Unsigned)
Copied from CRM [id number]. Topic: Clinical - Home Health Verbal Orders >> Aug 05, 2023 12:40 PM Joanette Gula wrote: Caller/Agency: [facility] Callback Number: [phone] Service Requested: Skilled Nursing  Any new concerns about the patient? Yes  wants to inform you that the insurance has approved the glucose monitor and need to let the patient know

## 2023-08-16 ENCOUNTER — Ambulatory Visit: Payer: Medicare Other

## 2023-08-17 ENCOUNTER — Ambulatory Visit
Admission: RE | Admit: 2023-08-17 | Discharge: 2023-08-17 | Disposition: A | Discharge status: Home / Self Care | Payer: Medicare Other | Source: Ambulatory Visit | Attending: Nurse Practitioner | Admitting: Nurse Practitioner

## 2023-08-17 ENCOUNTER — Other Ambulatory Visit: Payer: Self-pay

## 2023-08-17 VITALS — BP 90/52 | HR 80 | Temp 97.3°F | Resp 20

## 2023-08-17 DIAGNOSIS — R300 Dysuria: Secondary | ICD-10-CM | POA: Insufficient documentation

## 2023-08-17 DIAGNOSIS — R399 Unspecified symptoms and signs involving the genitourinary system: Secondary | ICD-10-CM | POA: Diagnosis not present

## 2023-08-17 LAB — POCT URINALYSIS DIP (MANUAL ENTRY)
Bilirubin, UA: NEGATIVE
Glucose, UA: >=1000 mg/dL — AB
Ketones, POC UA: NEGATIVE mg/dL
Nitrite, UA: NEGATIVE
Protein Ur, POC: NEGATIVE mg/dL
Spec Grav, UA: 1.01 (ref 1.010–1.025)
Urobilinogen, UA: 0.2 U/dL
pH, UA: 5.5 (ref 5.0–8.0)

## 2023-08-17 LAB — POCT FASTING CBG KUC MANUAL ENTRY: POCT Glucose (KUC): 408 mg/dL — AB (ref 70–99)

## 2023-08-17 MED ORDER — NITROFURANTOIN MONOHYD MACRO 100 MG PO CAPS
100.0000 mg | ORAL_CAPSULE | Freq: Two times a day (BID) | ORAL | 0 refills | Status: DC
Start: 2023-08-17 — End: 2023-10-09

## 2023-08-17 NOTE — ED Triage Notes (Signed)
 Pt reports was treated for UTI x2 months ago and reports "I've been falling a lot recently and my daughter said it could be because of kidney infection." Pt noted to be pale and reports generalized weakness.

## 2023-08-17 NOTE — Discharge Instructions (Addendum)
 A urine culture is pending.  You will be contacted if the pending test result is abnormal.  You also have access to the results via MyChart.  You will also be contacted if the results are negative and advised to stop the antibiotic. Take medication as prescribed. Increase your fluid intake.  Recommend drinking at least 4-7 8 ounce glasses of water daily. May take over-the-counter Tylenol  as needed for pain, fever, or general discomfort. Avoid caffeine such as tea, soda, and coffee while symptoms persist. Develop a toileting schedule that will allow you to urinate every 2 hours. Please follow-up with your primary care physician to discuss referral to urology if symptoms continue to persist. Go to the emergency department immediately if you experience worsening urinary symptoms with new symptoms of fever, chills, or other concerns. Follow-up as needed.

## 2023-08-17 NOTE — ED Provider Notes (Signed)
 [facility]    CSN: [id number] Arrival date & time: 08/17/23  1845      History   Chief Complaint Chief Complaint  Patient presents with   Urinary Frequency    Suspected urinary tract or kidney infection - Entered by patient    HPI Amy Livingston is a 68 y.o. female.   The history is provided by the patient.   The patient presents for complaints of dysuria that has been present for the past 2 months. Patient also reports generalized weakness, and increased falls. Patient is a diabetic. Reports that blood glucose runs high from time to time. Denies fever, chills, urinary frequency/urgency, hematuria, decreased urine stream or low back pain. Patient reports dizziness that is present at baseline, but reports it has worsened.  Patient reports she was last treated for a UTI in October.  She reports that she does have underlying history of urinary frequency and dysuria.  Reports that she does have a history of overactive bladder and takes Myrbetriq .  Patient also with underlying history atrial fibrillation, she takes Eliquis  daily.  Past Medical History:  Diagnosis Date   Anxiety    Atrial fibrillation (HCC)    Diabetes mellitus without complication (HCC)    Dysrhythmia    GERD (gastroesophageal reflux disease)    Hypertension    PONV (postoperative nausea and vomiting)    Shortness of breath dyspnea     Patient Active Problem List   Diagnosis Date Noted   Overactive bladder 06/17/2023   Generalized anxiety disorder 06/17/2023   Type 2 diabetes mellitus with diabetic neuropathy, unspecified (HCC) 06/17/2023   Hypertension 06/01/2023   Urinary frequency 06/01/2023   Bowel incontinence 06/01/2023   Generalized weakness 06/01/2023   Encounter for immunization 06/01/2023   Diabetes 1.5, 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   PAF (paroxysmal atrial fibrillation) (HCC) 08/14/2020   Dyslipidemia 07/01/2020   Type 2 diabetes mellitus with hyperglycemia (HCC) 07/01/2020    Body mass index (BMI) 40.0-44.9, adult (HCC) 11/07/2019   Elevated blood-pressure reading, without diagnosis of hypertension 11/07/2019   Spondylolisthesis, lumbar region 07/18/2018   Cervical spondylosis with myelopathy 01/13/2016   Cervical disc disorder with myelopathy 12/04/2015   Neck pain 12/04/2015    Past Surgical History:  Procedure Laterality Date   ABDOMINAL HYSTERECTOMY     ANTERIOR CERVICAL DECOMP/DISCECTOMY FUSION N/A 01/13/2016   Procedure: C5-6 ANTERIOR CERVICAL DECOMPRESSION/DISKECTOMY/FUSION;  Surgeon: Victory Gens, MD;  Location: [facility];  Service: Neurosurgery;  Laterality: N/A;  C5-6 ANTERIOR CERVICAL DECOMPRESSION/DISKECTOMY/FUSION   BACK SURGERY     x 5   BIOPSY  07/25/2023   Procedure: BIOPSY;  Surgeon: Cindie Carlin POUR, DO;  Location: [facility];  Service: Endoscopy;;   CHOLECYSTECTOMY     COLONOSCOPY WITH PROPOFOL  N/A 07/25/2023   Procedure: COLONOSCOPY WITH PROPOFOL ;  Surgeon: Cindie Carlin POUR, DO;  Location: [facility];  Service: Endoscopy;  Laterality: N/A;  10:45 am, asa 3   ESOPHAGEAL BRUSHING  07/25/2023   Procedure: ESOPHAGEAL BRUSHING;  Surgeon: Cindie Carlin POUR, DO;  Location: [facility];  Service: Endoscopy;;   ESOPHAGOGASTRODUODENOSCOPY (EGD) WITH PROPOFOL  N/A 07/25/2023   Procedure: ESOPHAGOGASTRODUODENOSCOPY (EGD) WITH PROPOFOL ;  Surgeon: Cindie Carlin POUR, DO;  [facility];  Service: Endoscopy;  Laterality: N/A;   FOOT SURGERY     KNEE ARTHROSCOPY     left   KNEE SURGERY Right    POLYPECTOMY  07/25/2023   Procedure: POLYPECTOMY INTESTINAL;  Surgeon: Cindie Carlin POUR, DO;  Location: [facility];  Service: Endoscopy;;   TUBAL LIGATION      OB History   No obstetric history on file.      Home Medications    Prior to Admission medications   Medication Sig Start Date End Date Taking? Authorizing Provider  nitrofurantoin , macrocrystal-monohydrate, (MACROBID ) 100 MG capsule Take 1 capsule (100 mg total) by  mouth 2 (two) times daily. 08/17/23  Yes Leath-Warren, Etta PARAS, NP  atorvastatin  (LIPITOR) 80 MG tablet Take 1 tablet (80 mg total) by mouth daily. 06/07/23   Zarwolo, Gloria, FNP  BD INSULIN  SYRINGE U/F 31G X 5/16 1 ML MISC USE FOR INSULIN  06/09/23   [provider]  Blood Glucose Monitoring Suppl DEVI 1 each by Does not apply route in the morning, at noon, and at bedtime. May substitute to any manufacturer covered by patient's insurance. 06/07/23   Zarwolo, Gloria, FNP  busPIRone  (BUSPAR ) 10 MG tablet Take 1 tablet (10 mg total) by mouth 2 (two) times daily. 06/16/23   Zarwolo, Gloria, FNP  Cholecalciferol (VITAMIN D ) 50 MCG (2000 UT) CAPS Take by mouth.    [provider]  Continuous Glucose Sensor (FREESTYLE LIBRE 3 SENSOR) MISC Place 1 sensor on the skin every 14 days. Use to check glucose continuously 06/26/23   Zarwolo, Gloria, FNP  ELIQUIS  5 MG TABS tablet Take 5 mg by mouth 2 (two) times daily.    [provider]  gabapentin  (NEURONTIN ) 300 MG capsule Take 1 capsule (300 mg total) by mouth 3 (three) times daily. 06/16/23   Zarwolo, Gloria, FNP  insulin  aspart (NOVOLOG ) 100 UNIT/ML injection ADMINISTER 20 UNITS UNDER THE SKIN THREE TIMES DAILY BEFORE MEALS 02/10/22   [provider]  insulin  glargine (LANTUS ) 100 UNIT/ML injection Inject 0.1 mLs (10 Units total) into the skin at bedtime. 06/16/23   Zarwolo, Gloria, FNP  Insulin  Pen Needle (NOVOFINE PEN NEEDLE) 32G X 6 MM MISC USE TO INJECT INSULIN  FOUR TIMES DAILY 02/15/22   [provider]  lisinopril  (ZESTRIL ) 5 MG tablet Take 1 tablet (5 mg total) by mouth daily. 06/07/23   Zarwolo, Gloria, FNP  meclizine  (ANTIVERT ) 25 MG tablet Take 25 mg by mouth daily as needed for dizziness.     [provider]  metFORMIN  (GLUCOPHAGE ) 1000 MG tablet Take 1 tablet (1,000 mg total) by mouth 2 (two) times daily with a meal. 06/07/23   Zarwolo, Gloria, FNP  metoprolol  tartrate (LOPRESSOR ) 50 MG tablet Take  50 mg by mouth 2 (two) times daily. 01/21/20   [provider]  mirabegron  ER (MYRBETRIQ ) 25 MG TB24 tablet Take 1 tablet (25 mg total) by mouth daily. 06/16/23   Zarwolo, Gloria, FNP  omeprazole  (PRILOSEC) 40 MG capsule Take 40 mg by mouth daily.      [provider]  oxyCODONE -acetaminophen  (PERCOCET/ROXICET) 5-325 MG tablet Take 1-2 tablets by mouth every 4 (four) hours as needed for moderate pain. Patient taking differently: Take 1-2 tablets by mouth every 8 (eight) hours as needed for moderate pain (pain score 4-6). 01/14/16   Colon Shove, MD  RESTASIS 0.05 % ophthalmic emulsion 1 drop 2 (two) times daily. 02/11/20   [provider]  sertraline  (ZOLOFT ) 50 MG tablet Take 1 tablet (50 mg total) by mouth daily. 06/16/23   Zarwolo, Gloria, FNP  tirzepatide  (MOUNJARO ) 2.5 MG/0.5ML Pen Inject 2.5 mg into the skin once a week. 06/07/23   Zarwolo, Gloria, FNP    Family History History reviewed. No pertinent family history.  Social History  Social History   Tobacco Use   Smoking status: Never   Smokeless tobacco: Never  Substance Use Topics   Alcohol use: No   Drug use: No     Allergies   Elemental sulfur   Review of Systems Review of Systems Per HPI  Physical Exam Triage Vital Signs ED Triage Vitals  Encounter Vitals Group     BP 08/17/23 1851 (!) 90/52     Systolic BP Percentile --      Diastolic BP Percentile --      Pulse Rate 08/17/23 1851 80     Resp 08/17/23 1851 20     Temp 08/17/23 1851 (!) 97.3 F (36.3 C)     Temp Source 08/17/23 1851 Oral     SpO2 08/17/23 1851 98 %     Weight --      Height --      Head Circumference --      Peak Flow --      Pain Score 08/17/23 1855 10     Pain Loc --      Pain Education --      Exclude from Growth Chart --    No data found.  Updated Vital Signs BP (!) 90/52 (BP Location: Right Arm)   Pulse 80   Temp (!) 97.3 F (36.3 C) (Oral)   Resp 20   SpO2 98%   Visual Acuity Right Eye Distance:    Left Eye Distance:   Bilateral Distance:    Right Eye Near:   Left Eye Near:    Bilateral Near:     Physical Exam Vitals and nursing note reviewed.  Constitutional:      General: She is not in acute distress.    Appearance: Normal appearance.  HENT:     Head: Normocephalic.  Eyes:     Extraocular Movements: Extraocular movements intact.     Pupils: Pupils are equal, round, and reactive to light.  Cardiovascular:     Rate and Rhythm: Normal rate and regular rhythm.     Pulses: Normal pulses.     Heart sounds: Normal heart sounds.  Pulmonary:     Effort: Pulmonary effort is normal.     Breath sounds: Normal breath sounds.  Abdominal:     General: Bowel sounds are normal.     Palpations: Abdomen is soft.     Tenderness: There is no abdominal tenderness. There is right CVA tenderness.  Musculoskeletal:     Cervical back: Normal range of motion.  Skin:    General: Skin is warm and dry.  Neurological:     General: No focal deficit present.     Mental Status: She is alert and oriented to person, place, and time.  Psychiatric:        Mood and Affect: Mood normal.        Behavior: Behavior normal.      UC Treatments / Results  Labs (all labs ordered are listed, but only abnormal results are displayed) Labs Reviewed  POCT URINALYSIS DIP (MANUAL ENTRY) - Abnormal; Notable for the following components:      Result Value   Clarity, UA cloudy (*)    Glucose, UA >=1,000 (*)    Blood, UA trace-intact (*)    Leukocytes, UA Small (1+) (*)    All other components within normal limits  POCT FASTING CBG [facility] MANUAL ENTRY - Abnormal; Notable for the following components:   POCT Glucose ([facility]) 408 (*)    All other components  within normal limits  URINE CULTURE    EKG   Radiology No results found.  Procedures Procedures (including critical care time)  Medications Ordered in UC Medications - No data to display  Initial Impression / Assessment and Plan / UC Course  I  have reviewed the triage vital signs and the nursing notes.  Pertinent labs & imaging results that were available during my care of the patient were reviewed by me and considered in my medical decision making (see chart for details).  Urinalysis is positive for small leukocytes and trace blood.  Urine culture is pending.  In the interim, will treat patient with Macrobid  100 mg for the next 5 days until culture results are received.  Supportive care recommendations were provided and discussed with the patient to include increasing fluids, over-the-counter analgesics, developing a toileting schedule, and avoiding caffeine.  Discussed indications regarding ER follow-up.  Patient advised to follow-up with her PCP if she continues to experience the same symptoms and her urine culture is negative.  Patient was in agreement with this plan of care and verbalized understanding.  All questions were answered.  Patient stable for discharge.  Final Clinical Impressions(s) / UC Diagnoses   Final diagnoses:  Dysuria  UTI symptoms     Discharge Instructions      A urine culture is pending.  You will be contacted if the pending test result is abnormal.  You also have access to the results via MyChart.  You will also be contacted if the results are negative and advised to stop the antibiotic. Take medication as prescribed. Increase your fluid intake.  Recommend drinking at least 4-7 8 ounce glasses of water daily. May take over-the-counter Tylenol  as needed for pain, fever, or general discomfort.      ED Prescriptions     Medication Sig Dispense Auth. Provider   nitrofurantoin , macrocrystal-monohydrate, (MACROBID ) 100 MG capsule Take 1 capsule (100 mg total) by mouth 2 (two) times daily. 10 capsule Leath-Warren, Etta PARAS, NP      PDMP not reviewed this encounter.   Gilmer Etta PARAS, NP 08/17/23 2002

## 2023-08-21 LAB — URINE CULTURE: Culture: >=100000 — AB

## 2023-08-31 ENCOUNTER — Other Ambulatory Visit: Payer: Self-pay | Admitting: Pharmacist

## 2023-08-31 NOTE — Progress Notes (Signed)
08/31/2023 Name: Darcell Yacoub MRN: [id number] DOB: Feb 07, 1956  Chief Complaint  Patient presents with   Diabetes         Terrilee Dudzik is a 68 y.o. year old female who presented for a telephone visit.   They were referred to the pharmacist by their PCP for assistance in managing diabetes and medication access.    Subjective:  Care Team: Primary Care Provider: Gilmore Laroche, FNP    Medication Access/Adherence  Current Pharmacy:  [facility] [address], [place] - [address] [place] [address] [place] [address] Phone: [phone] Fax: [phone]  [facility] [id number] - [place] - [address] AT [facility] [address] [place] [address] Phone: [phone] Fax: [phone]   Patient reports affordability concerns with their medications: No  Patient reports access/transportation concerns to their pharmacy: No  Patient reports adherence concerns with their medications:  Yes  confused on multiple pens  Diabetes:  Current medications: lispro, Mounjaro Medications tried in the past:  Trulicity last filled 03/18/22 Jardiance filled 07/28/21 Tresiba 05/05/22 Levemir 10/29/21 Omnipod 2023 Lispro vial 06/04/23 Humalog mix 50/50 10/2021 Novolin 70/30 08/24/23 Metformin 06/03/23 Ozempic 02/10/22 Mounjaro 07/09/23 A1c >15.5 06/01/23  Current glucose readings: 300-400 Was using CGM--insurance issues  Patient denies hypoglycemic s/sx including dizziness, shakiness, sweating. Patient reports hyperglycemic symptoms including polyuria, polydipsia, polyphagia, nocturia, neuropathy, blurred vision.  Current meal patterns:  Discussed meal planning options and Plate method for healthy eating Avoid sugary drinks and desserts Incorporate balanced protein, non starchy veggies, 1 serving of carbohydrate with each meal Increase water intake Increase physical activity as able  Current physical activity:  encouraged as able  Current medication access support: medicaid   Objective:  Lab Results  Component Value Date   HGBA1C >15.5 (H) 06/01/2023    Lab Results  Component Value Date   CREATININE 0.53 07/22/2023   BUN 9 07/22/2023   NA 137 07/22/2023   K 3.5 07/22/2023   CL 103 07/22/2023   CO2 25 07/22/2023    Lab Results  Component Value Date   CHOL 130 06/01/2023   HDL 49 06/01/2023   LDLCALC 57 06/01/2023   TRIG 138 06/01/2023   CHOLHDL 2.7 06/01/2023    Medications Reviewed Today     Reviewed by Danella Maiers, [facility] (Pharmacist) on 08/31/23 at 1314  Med List Status: <None>   Medication Order Taking? Sig Documenting Provider Last Dose Status Informant  atorvastatin (LIPITOR) 80 MG tablet [id number] No Take 1 tablet (80 mg total) by mouth daily. Gilmore Laroche, FNP Past Week Active   BD INSULIN SYRINGE U/F 31G X 5/16" 1 ML MISC [id number]  USE FOR INSULIN [provider]  Active   Blood Glucose Monitoring Suppl DEVI [id number] No 1 each by Does not apply route in the morning, at noon, and at bedtime. May substitute to any manufacturer covered by patient's insurance. Gilmore Laroche, FNP Taking Active   busPIRone (BUSPAR) 10 MG tablet [id number] No Take 1 tablet (10 mg total) by mouth 2 (two) times daily. Gilmore Laroche, FNP Past Week Active   Cholecalciferol (VITAMIN D) 50 MCG (2000 UT) CAPS [id number] No Take by mouth. [provider] Past Week Active   Continuous Glucose Sensor (FREESTYLE LIBRE 3 SENSOR) [place] [id number]  Place 1 sensor on the skin every 14 days. Use to check glucose continuously Gilmore Laroche, FNP  Active            Med Note (  Magda Kiel Jul 25, 2023  9:16 AM) Does not use   ELIQUIS 5 MG TABS tablet [id number] No Take 5 mg by mouth 2 (two) times daily. [provider] 07/21/2023 Active   gabapentin (NEURONTIN) 300 MG capsule [id number] No Take 1 capsule (300 mg total) by mouth 3 (three) times daily. Gilmore Laroche, FNP  Past Week Active   insulin aspart (NOVOLOG) 100 UNIT/ML injection [id number] No ADMINISTER 20 UNITS UNDER THE SKIN THREE TIMES DAILY BEFORE MEALS [provider] Past Week Active   insulin glargine (LANTUS) 100 UNIT/ML injection [id number]  Inject 0.1 mLs (10 Units total) into the skin at bedtime. Gilmore Laroche, FNP  Active            Med Note Zimmerman Desanctis   Mon Jul 25, 2023  9:16 AM) Has not started taking medication  Insulin Pen Needle (NOVOFINE PEN NEEDLE) 32G X 6 MM MISC [id number]  USE TO INJECT INSULIN FOUR TIMES DAILY [provider]  Active   lisinopril (ZESTRIL) 5 MG tablet [id number] No Take 1 tablet (5 mg total) by mouth daily. Gilmore Laroche, FNP Past Week Active   meclizine (ANTIVERT) 25 MG tablet [id number] No Take 25 mg by mouth daily as needed for dizziness.  [provider] More than a month Active Self  metFORMIN (GLUCOPHAGE) 1000 MG tablet [id number] No Take 1 tablet (1,000 mg total) by mouth 2 (two) times daily with a meal. Gilmore Laroche, FNP Past Month Active   metoprolol tartrate (LOPRESSOR) 50 MG tablet [id number] No Take 50 mg by mouth 2 (two) times daily. [provider] 07/25/2023  3:00 AM Active   mirabegron ER (MYRBETRIQ) 25 MG TB24 tablet [id number] No Take 1 tablet (25 mg total) by mouth daily. Gilmore Laroche, FNP 07/24/2023 Active   nitrofurantoin, macrocrystal-monohydrate, (MACROBID) 100 MG capsule [id number]  Take 1 capsule (100 mg total) by mouth 2 (two) times daily. Leath-Warren, Sadie Haber, NP  Active   omeprazole (PRILOSEC) 40 MG capsule [id number] No Take 40 mg by mouth daily.   [provider] 07/24/2023 Active Self  oxyCODONE-acetaminophen (PERCOCET/ROXICET) 5-325 MG tablet [id number] No Take 1-2 tablets by mouth every 4 (four) hours as needed for moderate pain.  Patient taking differently: Take 1-2 tablets by mouth every 8 (eight) hours as needed for moderate pain (pain score 4-6).   Barnett Abu, MD Past Week Active    RESTASIS 0.05 % ophthalmic emulsion [id number] No 1 drop 2 (two) times daily. [provider] Past Week Active   sertraline (ZOLOFT) 50 MG tablet [id number] No Take 1 tablet (50 mg total) by mouth daily. Gilmore Laroche, FNP Past Week Active   tirzepatide [facility]) 2.5 MG/0.5ML Pen [id number] No Inject 2.5 mg into the skin once a week. Gilmore Laroche, FNP 07/17/2023 Active              Assessment/Plan:   Diabetes: - Currently uncontrolled--patient sees endo in 09/2023--needs complete overhaul of regimen and counseling; would continue mounjaro and prefer basal insulin; will f/u with patient after endo appt to reinforce compliance - Reviewed long term cardiovascular and renal outcomes of uncontrolled blood sugar - Reviewed goal A1c, goal fasting, and goal 2 hour post prandial glucose - Reviewed dietary modifications including FOLLOWING A HEART HEALTHY DIET/HEALTHY PLATE METHOD - Patient denies personal or family history of multiple endocrine neoplasia type 2, medullary thyroid cancer; personal history of pancreatitis or gallbladder disease. - Recommend to check glucose daily (fasting) or if symptomatic  Follow Up Plan: after  endo; 2 months  Kieth Brightly, PharmD, BCACP, CPP Clinical Pharmacist, [facility]

## 2023-09-01 ENCOUNTER — Telehealth: Payer: Self-pay | Admitting: *Deleted

## 2023-09-01 NOTE — Progress Notes (Signed)
Complex Care Management Care Guide Note Health Equity Plan  09/01/2023 Name: Amy Livingston MRN: [id number] DOB: 02/21/1956  Amy Livingston is a 68 y.o. year old female who is a primary care patient of Gilmore Laroche, FNP and is actively engaged with the care management team. I reached out to Gordy Clement by phone today to assist with re-scheduling  with the RN Case Manager.  Follow up plan: Unsuccessful telephone outreach attempt made. A HIPAA compliant phone message was left for the patient providing contact information and requesting a return call.  Gwenevere Ghazi  [facility]  Value-Based Care Institute, [facility] Guide  Direct Dial: [phone]  Fax [phone]

## 2023-09-07 NOTE — Progress Notes (Signed)
Complex Care Management Care Guide Note  09/07/2023 Name: Amy Livingston MRN: [id number] DOB: 11-Aug-1955  Amy Livingston is a 68 y.o. year old female who is a primary care patient of Gilmore Laroche, FNP and is actively engaged with the care management team. I reached out to Gordy Clement by phone today to assist with re-scheduling  with the RN Case Manager.  Follow up plan: Unsuccessful telephone outreach attempt made. A HIPAA compliant phone message was left for the patient providing contact information and requesting a return call. No further outreach attempts will be made at this time. We have been unable to contact the patient.  Gwenevere Ghazi  [facility]  Value-Based Care Institute, [facility] Guide  Direct Dial: [phone]  Fax [phone]

## 2023-09-08 ENCOUNTER — Telehealth: Payer: Self-pay | Admitting: *Deleted

## 2023-09-08 ENCOUNTER — Telehealth: Payer: Self-pay

## 2023-09-08 ENCOUNTER — Other Ambulatory Visit: Payer: Self-pay | Admitting: Family Medicine

## 2023-09-08 DIAGNOSIS — Z794 Long term (current) use of insulin: Secondary | ICD-10-CM

## 2023-09-08 MED ORDER — OMEPRAZOLE 40 MG PO CPDR: 40.0000 mg | DELAYED_RELEASE_CAPSULE | Freq: Every day | ORAL | 1 refills | Status: DC

## 2023-09-08 MED ORDER — INSULIN GLARGINE 100 UNIT/ML ~~LOC~~ SOLN: 10.0000 [IU] | Freq: Every day | SUBCUTANEOUS | 9 refills | Status: DC

## 2023-09-08 NOTE — Telephone Encounter (Unsigned)
Copied from CRM [id number]. Topic: Clinical - Medication Question >> Sep 08, 2023  3:54 PM Gaetano Hawthorne wrote: Reason for CRM: Patient is requesting for their insulin pen to be sent to their [facility] pharmacy on file - the patient did not have the name of the insulin pen as she's used a few in the past - Patient mentioned that Raynelle Fanning was assisting with this item as well.

## 2023-09-08 NOTE — Progress Notes (Signed)
Health Equity Plan ' Care Guide Note 09/08/2023 Name: Amy Livingston MRN: [id number] DOB: 1955/09/21  Amy Livingston is a 68 y.o. year old female who sees Gilmore Laroche, FNP for primary care. I reached out to Christena Sunderlin by phone today to offer complex care management services.  Ms. Salak was given information about Complex Care Management services today including:   The Complex Care Management services include support from the care team which includes your Nurse Coordinator, Clinical Social Worker, or Pharmacist.  The Complex Care Management team is here to help remove barriers to the health concerns and goals most important to you. Complex Care Management services are voluntary, and the patient may decline or stop services at any time by request to their care team member.   Complex Care Management Consent Status: Patient agreed to services and verbal consent obtained.   Follow up plan:  Telephone appointment with complex care management team member scheduled for:  09/22/23  Encounter Outcome:  Patient Scheduled  Gwenevere Ghazi  [facility]  [facility], [facility] Guide  Direct Dial: [phone]  Fax [phone]

## 2023-09-08 NOTE — Telephone Encounter (Signed)
Rx sent

## 2023-09-09 NOTE — Telephone Encounter (Signed)
Called pt to inform no answer. Lvm to check w/ pharmacy as request has been approved.

## 2023-09-14 ENCOUNTER — Ambulatory Visit: Payer: Medicare Other | Admitting: Nutrition

## 2023-09-20 ENCOUNTER — Other Ambulatory Visit: Payer: Self-pay | Admitting: Family Medicine

## 2023-09-20 ENCOUNTER — Encounter: Payer: Self-pay | Admitting: Gastroenterology

## 2023-09-20 ENCOUNTER — Ambulatory Visit: Payer: Medicare Other | Admitting: Family Medicine

## 2023-09-20 DIAGNOSIS — E1165 Type 2 diabetes mellitus with hyperglycemia: Secondary | ICD-10-CM

## 2023-09-21 ENCOUNTER — Ambulatory Visit: Payer: Medicaid Other | Admitting: Nurse Practitioner

## 2023-09-22 ENCOUNTER — Ambulatory Visit: Payer: Self-pay | Admitting: *Deleted

## 2023-09-22 ENCOUNTER — Encounter: Payer: Self-pay | Admitting: *Deleted

## 2023-09-22 ENCOUNTER — Other Ambulatory Visit: Payer: Self-pay | Admitting: Family Medicine

## 2023-09-22 DIAGNOSIS — E1140 Type 2 diabetes mellitus with diabetic neuropathy, unspecified: Secondary | ICD-10-CM

## 2023-09-22 MED ORDER — GABAPENTIN 300 MG PO CAPS: 300.0000 mg | ORAL_CAPSULE | Freq: Three times a day (TID) | ORAL | 1 refills | Status: DC

## 2023-09-22 NOTE — Patient Outreach (Signed)
Care Coordination   Health Equity Plan Initial Visit Note   09/22/2023 Name: Amy Livingston MRN: [id number] DOB: 05-08-1956  Amy Livingston is a 68 y.o. year old female who sees Gilmore Laroche, FNP for primary care. I spoke with  Gordy Clement by phone today. [name] lives in [place] apartment with her adult daughter.   What matters to the patients health and wellness today?  Lowering A1C and finding a cause and effective treatment for dizziness and presyncopal symptoms.    Goals Addressed             This Visit's Progress    Manage Diabetes (Health Equity Plan)       Care Coordination Goals: Patient will follow-up with PCP and/or endocrinologist every 3 months or as recommended Patient will take medication as prescribed and reach out to provider with any negative side effects Patient will continue to monitor and record blood sugar 2 times per day and as needed with glucometer, and will call PCP or endocrinologist with any readings outside of recommended range Patient will take blood sugar log and meter to provider visits for review Patient will keep appointment with registered dietician and follow dietary recommendations General recommendation: Avoid sugary drinks and foods. Eat 3 meals per day with 30 grams of carbohydrates and up to 2 snacks per day, if needed, with less than 15 grams of carbohydrates Patient will increase activity level as tolerated with an ultimate goal of at least 150 minutes of exercise per week Patient will check feet daily for sores, wounds, calluses, etc and will notify provider of any abnormal findings Patient will take shoes and socks off at PCP/endocrinology visits for foot exams Patient will consider having a podiatrist or other professional trim her toenails to prevent injury Patient will schedule yearly eye exams to check for or monitor diabetic retinopathy Patient will reach out to RN Care Manager [phone] with any care coordination or resource  needs      Manage Dizziness and Prevent Falls       Care Coordination Goals: Patient will use assistive devices as needed for balance Patient will take medication as directed Patient will move carefully and change positions slowly to minimize risk for falling Patient will notify provider of any falls and will seek emergency medical attention if needed Patient will seek emergency medical attention if she hits her head Patient will talk with PCP about a cardiology referral to establish care with a new cardiologist in [place] Patient will monitor and record blood pressure and take log to office visit for review Patient will monitor blood sugar at least twice a day and as needed if symptomatic  Patient will discuss episodes of dizziness and presyncopal symptoms with provider. Further testing may be needed. Patient will reach out to RN Care Manager at [phone] with any care coordination or resource needs         SDOH assessments and interventions completed:  Yes  SDOH Interventions Today    Flowsheet Row Most Recent Value  SDOH Interventions   Food Insecurity Interventions Intervention Not Indicated  Housing Interventions Intervention Not Indicated  Transportation Interventions Intervention Not Indicated  Utilities Interventions Intervention Not Indicated  Financial Strain Interventions Intervention Not Indicated  Physical Activity Interventions PREP Program, [employer]  Social Connections Interventions [employer], Other (Comment)  [[facility]]        Care Coordination Interventions:  Yes, provided  Interventions Today    Flowsheet Row Most Recent Value  Chronic Disease   Chronic  disease during today's visit Diabetes, Other  [Dizziness and high risk for falls]  General Interventions   General Interventions Discussed/Reviewed General Interventions Discussed, General Interventions Reviewed, Labs, Annual Eye Exam, Annual Foot Exam, Durable Medical Equipment  (DME), Vaccines, Health Screening, Doctor Visits, Communication with, Lipid Profile  Labs Hgb A1c every 3 months, Kidney Function  Vaccines COVID-19, Flu, Pneumonia, RSV  Doctor Visits Discussed/Reviewed Doctor Visits Discussed, Doctor Visits Reviewed, Annual Wellness Visits, PCP, Specialist  Health Screening Bone Density, Colonoscopy, Mammogram  Durable Medical Equipment (DME) Glucomoter, BP Cuff, Walker, Other  [cane, shower chair without back. Freestyle Libre but no sensors.Blood sugar was 120 this morning. Ranges between 100 and 200 fasting and non-fasting.]  PCP/Specialist Visits Compliance with follow-up visit, Contact provider for referral to  [[facility] 09/28/23, Vanice Sarah, PharmD 09/28/23, PCP 10/13/23, Norm Salt, RD 10/17/23, endocrinology 12/08/23]  Contacted provider for referral to Specialist  [cardiologist in [place] to establish care]  Communication with Pharmacists, PCP/Specialists  Vanice Sarah, PharmD IE:PPIRJJOA mediations. Metformin causes nausea. Insurance denied Lantus. Likely not on formulary.Has Freestyle Libre meter. Insurance denied sensors.May need to be sent to DME supplier in order to cover. Meets criteria for coverage.]  Exercise Interventions   Exercise Discussed/Reviewed Exercise Discussed, Exercise Reviewed, Physical Activity, Assistive device use and maintanence, Weight Managment  [Has a cane and walker that she uses as needed. Able to perform most ADLs independently. Son takes trash out to garbage. Has steps in apartment but does not use the 2nd floor.]  Physical Activity Discussed/Reviewed Physical Activity Discussed, Physical Activity Reviewed, PREP  [outreach to PCP to discuss PREP and request order. Discussed water aerobics at [employer]. Patient is interested in this.]  Weight Management Weight maintenance  [has intentionally lost over 100 lbs over the past year through medication and diet changes. Would like to increase muscle mass through exercise.]  Education  Interventions   Education Provided Provided Education  Provided Verbal Education On Nutrition, Foot Care, Eye Care, Labs, Blood Sugar Monitoring, Exercise, Medication, When to see the doctor, [facility], [employer], Mental Health/Coping with Illness, Other  [Encouraged to cut toenails carefully or have a professional cut them to avoid injury. Take shoes off at PCP and/or endocrinology visit for foot exam. Discussed possibility of orthostatic hypotension and/or POTS. Encouraged blood pressure monitoring.]  Labs Reviewed Hgb A1c, Kidney Function, Lipid Profile  [07/22/23 A1C >15.5%]  Mental Health Interventions   Mental Health Discussed/Reviewed Mental Health Discussed, Mental Health Reviewed  [denies mental health needs at this time]  Nutrition Interventions   Nutrition Discussed/Reviewed Nutrition Discussed, Nutrition Reviewed, Carbohydrate meal planning, Adding fruits and vegetables, Fluid intake, Portion sizes, Decreasing sugar intake  [Follow guidelines from Norm Salt, RD, CDE. Avoid sugary drinks and simple carbohydrates. Eat 3 meals per day with 30 GM of CHO and up to 2 snacks per day, if needed, with less than 15 GM of CHO. Discussed CHO counting.]  Pharmacy Interventions   Pharmacy Dicussed/Reviewed Pharmacy Topics Discussed, Pharmacy Topics Reviewed, Medications and their functions  [Reconciled medications. Not taking metformin due to nausea. Taking Novolin 70/30 instead of Novolog 100. Not taking Lantus due to insurance denial. meclizine doesn't help with dizziness. Staff message to gastroenterologist to verify prilosec dose.]  Safety Interventions   Safety Discussed/Reviewed Safety Discussed, Safety Reviewed, Fall Risk, Home Safety  [Fall assessment performed. Has fallend more than 10 times within the past year. No injuries. Increased risk for bleeding due to Eliquis. C/O dizziness and presyncopal symptoms prior to falls. Encouraged to move  carefully and change positions  slowly.]  Home Safety Assistive Devices  Advanced Directive Interventions   Advanced Directives Discussed/Reviewed Advanced Directives Discussed, Advanced Care Planning  [Patient reports having ACP documents but she hasn't completed them. Provided eduation on how to complete them and to provide a copy to her PCP for EMR.]       Follow up plan: Follow up call scheduled for 10/06/23    Encounter Outcome:  Patient Visit Completed   Demetrios Loll, RN, BSN [place]  [facility], [facility] RN Care Manager Direct Dial: [phone]

## 2023-09-22 NOTE — Telephone Encounter (Signed)
Copied from CRM [id number]. Topic: Clinical - Medication Refill >> Sep 22, 2023  1:59 PM Eunice Blase wrote: Most Recent Primary Care Visit:  Provider: Gilmore Laroche  Department: [facility]  Visit Type: OFFICE VISIT  Date: 06/16/2023  Medication: gabapentin (NEURONTIN) 300 MG capsule  Has the patient contacted their pharmacy? Yes (Agent: If no, request that the patient contact the pharmacy for the refill. If patient does not wish to contact the pharmacy document the reason why and proceed with request.) (Agent: If yes, when and what did the pharmacy advise?)  Is this the correct pharmacy for this prescription? Yes If no, delete pharmacy and type the correct one.  This is the patient's preferred pharmacy:    [facility] [phone] - [place], [place] - [address] AT [facility] [address] [place] [address] Phone: [phone] Fax: [phone]   Has the prescription been filled recently? Yes  Is the patient out of the medication? Yes  Has the patient been seen for an appointment in the last year OR does the patient have an upcoming appointment? Yes  Can we respond through MyChart? Yes  Agent: Please be advised that Rx refills may take up to 3 business days. We ask that you follow-up with your pharmacy.

## 2023-09-28 ENCOUNTER — Other Ambulatory Visit: Payer: Self-pay | Admitting: Pharmacist

## 2023-09-28 ENCOUNTER — Telehealth: Payer: Self-pay | Admitting: Family Medicine

## 2023-09-28 ENCOUNTER — Ambulatory Visit (INDEPENDENT_AMBULATORY_CARE_PROVIDER_SITE_OTHER): Payer: Medicare Other

## 2023-09-28 VITALS — Ht 66.0 in | Wt 209.0 lb

## 2023-09-28 DIAGNOSIS — Z1231 Encounter for screening mammogram for malignant neoplasm of breast: Secondary | ICD-10-CM

## 2023-09-28 DIAGNOSIS — Z0000 Encounter for general adult medical examination without abnormal findings: Secondary | ICD-10-CM | POA: Diagnosis not present

## 2023-09-28 DIAGNOSIS — E1165 Type 2 diabetes mellitus with hyperglycemia: Secondary | ICD-10-CM

## 2023-09-28 DIAGNOSIS — Z780 Asymptomatic menopausal state: Secondary | ICD-10-CM

## 2023-09-28 MED ORDER — TIRZEPATIDE 5 MG/0.5ML ~~LOC~~ SOAJ
5.0000 mg | SUBCUTANEOUS | 3 refills | Status: DC
Start: 2023-09-28 — End: 2023-10-22

## 2023-09-28 NOTE — Progress Notes (Signed)
09/28/2023 Name: Amy Livingston MRN: [id number] DOB: 1955/09/16  Chief Complaint  Patient presents with   Diabetes    Amy Livingston is a 68 y.o. year old female who presented for a telephone visit.   They were referred to the pharmacist by their PCP for assistance in managing diabetes and medication access.    Subjective:  Care Team: Primary Care Provider: Gilmore Laroche, FNP ; Elly Modena, FNP; Next Scheduled Visit: 12/2023  Medication Access/Adherence  Current Pharmacy:  [facility] [address], [place] - [address] [place] [address] [place] [address] Phone: [phone] Fax: [phone]  [facility] [phone] - , [place] - [address] AT [facility] [address]  [place] [address] Phone: [phone] Fax: [phone]   Patient reports affordability concerns with their medications: No  Patient reports access/transportation concerns to their pharmacy: No  Patient reports adherence concerns with their medications:  Yes  insurance PAs   Diabetes:  Current medications: Mounjaro 2.5mg , novolin 70/30 40 units BID Medications tried in the past:  Trulicity last filled 03/18/22 Jardiance filled 07/28/21 Tresiba 05/05/22 Levemir 10/29/21 Omnipod 2023 Lispro vial 06/04/23 Humalog mix 50/50 10/2021 Novolin 70/30 08/24/23 Metformin 06/03/23--can't tolerate due to GI Ozempic 02/10/22 Mounjaro 07/09/23 A1c >15.5 06/01/23   Current glucose readings: 300-400, reports no FBG>300 Was using CGM--insurance issues   Using accu chek guide meter  Patient denies hypoglycemic s/sx including dizziness, shakiness, sweating. Patient reports hyperglycemic symptoms including polyuria, polydipsia, polyphagia, nocturia, neuropathy, blurred vision.  Current meal patterns:  Discussed meal planning options and Plate method for healthy eating Avoid sugary drinks and desserts Incorporate balanced protein, non starchy  veggies, 1 serving of carbohydrate with each meal Increase water intake Increase physical activity as able  Current physical activity: encourage as able  Current medication access support: n/a   Objective:  Lab Results  Component Value Date   HGBA1C >15.5 (H) 06/01/2023    Lab Results  Component Value Date   CREATININE 0.53 07/22/2023   BUN 9 07/22/2023   NA 137 07/22/2023   K 3.5 07/22/2023   CL 103 07/22/2023   CO2 25 07/22/2023    Lab Results  Component Value Date   CHOL 130 06/01/2023   HDL 49 06/01/2023   LDLCALC 57 06/01/2023   TRIG 138 06/01/2023   CHOLHDL 2.7 06/01/2023    Medications Reviewed Today     Reviewed by Danella Maiers, [facility] (Pharmacist) on 09/28/23 at 1011  Med List Status: <None>   Medication Order Taking? Sig Documenting Provider Last Dose Status Informant  atorvastatin (LIPITOR) 80 MG tablet [id number] No Take 1 tablet (80 mg total) by mouth daily. Gilmore Laroche, FNP Taking Active   Blood Glucose Monitoring Suppl DEVI [id number] No 1 each by Does not apply route in the morning, at noon, and at bedtime. May substitute to any manufacturer covered by patient's insurance. Gilmore Laroche, FNP Taking Active   busPIRone (BUSPAR) 10 MG tablet [id number] No Take 1 tablet (10 mg total) by mouth 2 (two) times daily. Gilmore Laroche, FNP Taking Active   Cholecalciferol (VITAMIN D) 50 MCG (2000 UT) CAPS [id number] No Take by mouth. [provider] Taking Active   Continuous Glucose Sensor (FREESTYLE LIBRE 3 SENSOR) [place] [id number] No Place 1 sensor on the skin every 14 days. Use to check glucose continuously  Patient not taking: Reported on 09/22/2023   Gilmore Laroche, FNP Not Taking Active  Med Note Magda Kiel Jul 25, 2023  9:16 AM) Does not use   ELIQUIS 5 MG TABS tablet [id number] No Take 5 mg by mouth 2 (two) times daily. [provider] Taking Active   gabapentin (NEURONTIN) 300 MG capsule [id number] No Take 1 capsule  (300 mg total) by mouth 3 (three) times daily. Gilmore Laroche, FNP Taking Active   insulin aspart (NOVOLOG) 100 UNIT/ML injection [id number] No ADMINISTER 20 UNITS UNDER THE SKIN THREE TIMES DAILY BEFORE MEALS [provider] Past Week Active   insulin glargine (LANTUS) 100 UNIT/ML injection [id number] No Inject 0.1 mLs (10 Units total) into the skin at bedtime.  Patient not taking: Reported on 09/22/2023   Gilmore Laroche, FNP Not Taking Active   Insulin Pen Needle (NOVOFINE PEN NEEDLE) 32G X 6 MM MISC [id number]  USE TO INJECT INSULIN FOUR TIMES DAILY [provider]  Active   Insulin Syringe-Needle U-100 (BD INSULIN SYRINGE U/F) 31G X 5/16" 1 ML MISC [id number]  USE FOR INSULIN Gilmore Laroche, FNP  Active   lisinopril (ZESTRIL) 5 MG tablet [id number] No Take 1 tablet (5 mg total) by mouth daily. Gilmore Laroche, FNP Taking Active   meclizine (ANTIVERT) 25 MG tablet [id number] No Take 25 mg by mouth daily as needed for dizziness.  [provider] Taking Active Self  metFORMIN (GLUCOPHAGE) 1000 MG tablet [id number] No Take 1 tablet (1,000 mg total) by mouth 2 (two) times daily with a meal. Gilmore Laroche, FNP Not Taking Active   metoprolol tartrate (LOPRESSOR) 50 MG tablet [id number] No Take 50 mg by mouth 2 (two) times daily. [provider] Taking Active   mirabegron ER (MYRBETRIQ) 25 MG TB24 tablet [id number] No Take 1 tablet (25 mg total) by mouth daily. Gilmore Laroche, FNP Taking Active   MOUNJARO 2.5 MG/0.5ML Pen [id number] No ADMINISTER 2.5 MG UNDER THE SKIN 1 TIME A WEEK Gilmore Laroche, FNP Taking Active   nitrofurantoin, macrocrystal-monohydrate, (MACROBID) 100 MG capsule [id number]  Take 1 capsule (100 mg total) by mouth 2 (two) times daily. Leath-Warren, Sadie Haber, NP  Active   omeprazole (PRILOSEC) 40 MG capsule [id number] No Take 1 capsule (40 mg total) by mouth daily. Gilmore Laroche, FNP Taking Active   oxyCODONE-acetaminophen (PERCOCET/ROXICET) 5-325 MG  tablet [id number] No Take 1-2 tablets by mouth every 4 (four) hours as needed for moderate pain.  Patient taking differently: Take 1-2 tablets by mouth every 8 (eight) hours as needed for moderate pain (pain score 4-6).   Barnett Abu, MD Taking Active   RESTASIS 0.05 % ophthalmic emulsion [id number] No 1 drop 2 (two) times daily. [provider] Taking Active   sertraline (ZOLOFT) 50 MG tablet [id number] No Take 1 tablet (50 mg total) by mouth daily. Gilmore Laroche, FNP Taking Active             Assessment/Plan:   Diabetes: - Currently uncontrolled - Reviewed long term cardiovascular and renal outcomes of uncontrolled blood sugar - Reviewed goal A1c, goal fasting, and goal 2 hour post prandial glucose - Recommend to : Continue Novolin 70/30 pen --40 units twice daily -Increase Mounjaro to 5 mg weekly (next shot Tuesday--double up to equal 5mg ) -Needs Libre 3 PLUS -Hasn't gotten lantus will hold and continue 70/30 for now - Patient denies personal or family history of multiple endocrine neoplasia type 2, medullary thyroid cancer; personal history of pancreatitis or gallbladder disease.   Follow Up Plan: 1 month with PHarmD  Kieth Brightly, PharmD, BCACP, CPP Clinical  Pharmacist, [facility]

## 2023-09-28 NOTE — Telephone Encounter (Signed)
The patient must request this and I don't see where she did

## 2023-09-28 NOTE — Progress Notes (Signed)
Please attest and cosign this visit due to patients primary care provider not being in the office at the time the visit was completed.  Because this visit was a virtual/telehealth visit,  certain criteria was not obtained, such a blood pressure, CBG if applicable, and timed get up and go. Any medications not marked as "taking" were not mentioned during the medication reconciliation part of the visit. Any vitals not documented were not able to be obtained due to this being a telehealth visit or patient was unable to self-report a recent blood pressure reading due to a lack of equipment at home via telehealth. Vitals that have been documented are verbally provided by the patient.  Interactive audio and video telecommunications were attempted between this provider and patient, however failed, due to patient having technical difficulties OR patient did not have access to video capability.  We continued and completed visit with audio only.  Subjective:   Amy Livingston is a 68 y.o. female who presents for Medicare Annual (Subsequent) preventive examination.  Visit Complete: Virtual I connected with  Amy Livingston on 09/28/23 by a audio enabled telemedicine application and verified that I am speaking with the correct person using two identifiers.  Patient Location: Home  Provider Location: Home Office  I discussed the limitations of evaluation and management by telemedicine. The patient expressed understanding and agreed to proceed.  Vital Signs: Because this visit was a virtual/telehealth visit, some criteria may be missing or patient reported. Any vitals not documented were not able to be obtained and vitals that have been documented are patient reported.  Cardiac Risk Factors include: advanced age (>20men, >26 women);diabetes mellitus;dyslipidemia;hypertension;sedentary lifestyle;obesity (BMI >30kg/m2);Other (see comment), Risk factor comments: AFib     Objective:    Today's Vitals   09/28/23  1046 09/28/23 1049  Weight: 209 lb (94.8 kg)   Height: 5\' 6"  (1.676 m)   PainSc:  5    Body mass index is 33.73 kg/m.     09/28/2023   10:46 AM 07/25/2023    9:18 AM 07/22/2023    9:35 AM 06/15/2023    3:21 PM 06/03/2023    1:44 PM 05/20/2016    2:37 PM 01/06/2016   12:57 PM  Advanced Directives  Does Patient Have a Medical Advance Directive? No No No No No No No  Would patient like information on creating a medical advance directive? Yes (MAU/Ambulatory/Procedural Areas - Information given) No - Patient declined No - Patient declined Yes (MAU/Ambulatory/Procedural Areas - Information given)  Yes - Educational materials given No - patient declined information    Current Medications (verified) Outpatient Encounter Medications as of 09/28/2023  Medication Sig   atorvastatin (LIPITOR) 80 MG tablet Take 1 tablet (80 mg total) by mouth daily.   Blood Glucose Monitoring Suppl DEVI 1 each by Does not apply route in the morning, at noon, and at bedtime. May substitute to any manufacturer covered by patient's insurance.   busPIRone (BUSPAR) 10 MG tablet Take 1 tablet (10 mg total) by mouth 2 (two) times daily.   Cholecalciferol (VITAMIN D) 50 MCG (2000 UT) CAPS Take by mouth.   Continuous Glucose Sensor (FREESTYLE LIBRE 3 SENSOR) MISC Place 1 sensor on the skin every 14 days. Use to check glucose continuously   ELIQUIS 5 MG TABS tablet Take 5 mg by mouth 2 (two) times daily.   gabapentin (NEURONTIN) 300 MG capsule Take 1 capsule (300 mg total) by mouth 3 (three) times daily.   insulin aspart (NOVOLOG) 100 UNIT/ML injection  ADMINISTER 20 UNITS UNDER THE SKIN THREE TIMES DAILY BEFORE MEALS   insulin glargine (LANTUS) 100 UNIT/ML injection Inject 0.1 mLs (10 Units total) into the skin at bedtime.   Insulin Pen Needle (NOVOFINE PEN NEEDLE) 32G X 6 MM MISC USE TO INJECT INSULIN FOUR TIMES DAILY   Insulin Syringe-Needle U-100 (BD INSULIN SYRINGE U/F) 31G X 5/16" 1 ML MISC USE FOR INSULIN    lisinopril (ZESTRIL) 5 MG tablet Take 1 tablet (5 mg total) by mouth daily.   meclizine (ANTIVERT) 25 MG tablet Take 25 mg by mouth daily as needed for dizziness.    metFORMIN (GLUCOPHAGE) 1000 MG tablet Take 1 tablet (1,000 mg total) by mouth 2 (two) times daily with a meal.   metoprolol tartrate (LOPRESSOR) 50 MG tablet Take 50 mg by mouth 2 (two) times daily.   mirabegron ER (MYRBETRIQ) 25 MG TB24 tablet Take 1 tablet (25 mg total) by mouth daily.   nitrofurantoin, macrocrystal-monohydrate, (MACROBID) 100 MG capsule Take 1 capsule (100 mg total) by mouth 2 (two) times daily.   omeprazole (PRILOSEC) 40 MG capsule Take 1 capsule (40 mg total) by mouth daily.   oxyCODONE-acetaminophen (PERCOCET/ROXICET) 5-325 MG tablet Take 1-2 tablets by mouth every 4 (four) hours as needed for moderate pain. (Patient taking differently: Take 1-2 tablets by mouth every 8 (eight) hours as needed for moderate pain (pain score 4-6).)   RESTASIS 0.05 % ophthalmic emulsion 1 drop 2 (two) times daily.   sertraline (ZOLOFT) 50 MG tablet Take 1 tablet (50 mg total) by mouth daily.   tirzepatide [facility]) 5 MG/0.5ML Pen Inject 5 mg into the skin once a week.   No facility-administered encounter medications on file as of 09/28/2023.    Allergies (verified) Elemental sulfur and Metformin and related   History: Past Medical History:  Diagnosis Date   Anxiety    Atrial fibrillation (HCC)    Diabetes mellitus without complication (HCC)    Dysrhythmia    GERD (gastroesophageal reflux disease)    Hypertension    PONV (postoperative nausea and vomiting)    Shortness of breath dyspnea    Past Surgical History:  Procedure Laterality Date   ABDOMINAL HYSTERECTOMY     ANTERIOR CERVICAL DECOMP/DISCECTOMY FUSION N/A 01/13/2016   Procedure: C5-6 ANTERIOR CERVICAL DECOMPRESSION/DISKECTOMY/FUSION;  Surgeon: Barnett Abu, MD;  Location: [facility];  Service: Neurosurgery;  Laterality: N/A;  C5-6 ANTERIOR CERVICAL  DECOMPRESSION/DISKECTOMY/FUSION   BACK SURGERY     x 5   BIOPSY  07/25/2023   Procedure: BIOPSY;  Surgeon: Lanelle Bal, DO;  Location: [facility];  Service: Endoscopy;;   CHOLECYSTECTOMY     COLONOSCOPY WITH PROPOFOL N/A 07/25/2023   Procedure: COLONOSCOPY WITH PROPOFOL;  Surgeon: Lanelle Bal, DO;  Location: [facility];  Service: Endoscopy;  Laterality: N/A;  10:45 am, asa 3   ESOPHAGEAL BRUSHING  07/25/2023   Procedure: ESOPHAGEAL BRUSHING;  Surgeon: Lanelle Bal, DO;  Location: [facility];  Service: Endoscopy;;   ESOPHAGOGASTRODUODENOSCOPY (EGD) WITH PROPOFOL N/A 07/25/2023   Procedure: ESOPHAGOGASTRODUODENOSCOPY (EGD) WITH PROPOFOL;  Surgeon: Lanelle Bal, DO;  Location: [facility];  Service: Endoscopy;  Laterality: N/A;   FOOT SURGERY     KNEE ARTHROSCOPY     left   KNEE SURGERY Right    POLYPECTOMY  07/25/2023   Procedure: POLYPECTOMY INTESTINAL;  Surgeon: Lanelle Bal, DO;  Location: [facility];  Service: Endoscopy;;   TUBAL LIGATION     History reviewed. No pertinent  family history. Social History   Socioeconomic History   Marital status: Divorced    Spouse name: Not on file   Number of children: Not on file   Years of education: Not on file   Highest education level: Some college, no degree  Occupational History   Not on file  Tobacco Use   Smoking status: Never   Smokeless tobacco: Never  Substance and Sexual Activity   Alcohol use: No   Drug use: No   Sexual activity: Not on file  Other Topics Concern   Not on file  Social History Narrative   Not on file   Social Drivers of Health   Financial Resource Strain: Low Risk  (09/28/2023)   Overall Financial Resource Strain (CARDIA)    Difficulty of Paying Living Expenses: Not very hard  Food Insecurity: No Food Insecurity (09/28/2023)   Hunger Vital Sign    Worried About Running Out of Food in the Last Year: Never true    Ran Out of Food in the Last Year: Never true   Transportation Needs: No Transportation Needs (09/28/2023)   PRAPARE - Administrator, Civil Service (Medical): No    Lack of Transportation (Non-Medical): No  Physical Activity: Inactive (09/28/2023)   Exercise Vital Sign    Days of Exercise per Week: 0 days    Minutes of Exercise per Session: 0 min  Stress: Stress Concern Present (09/28/2023)   [employer] of Occupational Health - Occupational Stress Questionnaire    Feeling of Stress : Rather much  Social Connections: Moderately Isolated (09/28/2023)   Social Connection and Isolation Panel [NHANES]    Frequency of Communication with Friends and Family: More than three times a week    Frequency of Social Gatherings with Friends and Family: More than three times a week    Attends Religious Services: More than 4 times per year    Active Member of [employer] or Organizations: No    Attends Engineer, structural: Never    Marital Status: Divorced    Tobacco Counseling Counseling given: Yes   Clinical Intake:  Pre-visit preparation completed: Yes  Pain : 0-10 Pain Score: 5  Pain Type: Chronic pain Pain Location: Back Pain Orientation: Upper, Mid, Lower (has had 7 spine surgeries) Pain Descriptors / Indicators: Constant Pain Onset: More than a month ago Pain Frequency: Constant     BMI - recorded: 33.73 Nutritional Risks: None Diabetes: Yes CBG done?: No (telehealth visit.) Did pt. bring in CBG monitor from home?: No  How often do you need to have someone help you when you read instructions, pamphlets, or other written materials from your doctor or pharmacy?: 1 - Never  Interpreter Needed?: No  Information entered by :: Maryjean Ka CMA   Activities of Daily Living    09/28/2023   10:54 AM 07/22/2023    9:00 AM  In your present state of health, do you have any difficulty performing the following activities:  Hearing? 0 0  Vision? 0 0  Difficulty concentrating or making decisions? 0 0  Walking or  climbing stairs? 1   Comment uses cane/walker hx of falls   Dressing or bathing? 0   Doing errands, shopping? 1   Preparing Food and eating ? N   Using the Toilet? N   In the past six months, have you accidently leaked urine? Y   Do you have problems with loss of bowel control? Y   Managing your Medications? N   Managing  your Finances? N   Housekeeping or managing your Housekeeping? Y   Comment daughter helps her     Patient Care Team: Gilmore Laroche, FNP as PCP - General (Family Medicine) Gwenith Daily, RN as VBCI Care Management  Indicate any recent Medical Services you may have received from other than [facility] providers in the past year (date may be approximate).     Assessment:   This is a routine wellness examination for Amy Livingston.  Hearing/Vision screen Hearing Screening - Comments:: Patient denies any hearing difficulties.   Vision Screening - Comments:: Wears rx glasses - up to date with routine eye exams  She has been seeing [facility] in [place] however they no longer have a provider at this time. She will try to get in to be seen at the [place] office.     Goals Addressed             This Visit's Progress    Patient Stated       To improve my health       Depression Screen    09/28/2023   11:06 AM 06/16/2023    3:50 PM 06/16/2023    3:44 PM 06/01/2023    3:09 PM 06/01/2023    2:56 PM  PHQ 2/9 Scores  PHQ - 2 Score 0 0 0 4 0  PHQ- 9 Score  0 0 17 0    Fall Risk    09/28/2023   10:54 AM 09/22/2023    3:44 PM 06/16/2023    3:50 PM 06/16/2023    3:44 PM 06/01/2023    2:56 PM  Fall Risk   Falls in the past year? 1 1 0 0 0  Number falls in past yr: 1 1 0 0 0  Comment  at least 10 over the past year     Injury with Fall? 1 0 0 0 0  Risk for fall due to : History of fall(s);Impaired balance/gait;Orthopedic patient;Impaired mobility History of fall(s);Impaired balance/gait No Fall Risks No Fall Risks No Fall Risks;Orthopedic patient  Follow up Falls  prevention discussed;Education provided  Falls evaluation completed Falls evaluation completed Falls evaluation completed    MEDICARE RISK AT HOME: Medicare Risk at Home Any stairs in or around the home?: Yes If so, are there any without handrails?: No Home free of loose throw rugs in walkways, pet beds, electrical cords, etc?: Yes Adequate lighting in your home to reduce risk of falls?: Yes Life alert?: No Use of a cane, walker or w/c?: Yes Grab bars in the bathroom?: No Shower chair or bench in shower?: Yes Elevated toilet seat or a handicapped toilet?: No  TIMED UP AND GO:  Was the test performed?  No    Cognitive Function:        09/28/2023   10:51 AM  6CIT Screen  What Year? 0 points  What month? 0 points  What time? 0 points  Count back from 20 0 points  Months in reverse 0 points  Repeat phrase 0 points  Total Score 0 points    Immunizations Immunization History  Administered Date(s) Administered   Fluad Trivalent(High Dose 65+) 06/01/2023   Fluzone Influenza virus vaccine,trivalent (IIV3), split virus 09/04/2010   Influenza Split 05/15/2015, 05/13/2016   PNEUMOCOCCAL CONJUGATE-20 06/01/2023   Pneumococcal Polysaccharide-23 09/04/2010   Tdap 05/20/2014, 05/10/2016    TDAP status: Up to date  Flu Vaccine status: Up to date  Pneumococcal vaccine status: Up to date  Covid-19 vaccine status: Information provided  on how to obtain vaccines.   Qualifies for Shingles Vaccine? Yes   Zostavax completed No   Shingrix Completed?: No.    Education has been provided regarding the importance of this vaccine. Patient has been advised to call insurance company to determine out of pocket expense if they have not yet received this vaccine. Advised may also receive vaccine at local pharmacy or Health Dept. Verbalized acceptance and understanding.  Screening Tests Health Maintenance  Topic Date Due   DEXA SCAN  Never done   Medicare Annual Wellness (AWV)  Never done    COVID-19 Vaccine (1) Never done   OPHTHALMOLOGY EXAM  Never done   Zoster Vaccines- Shingrix (1 of 2) Never done   MAMMOGRAM  02/19/2023   HEMOGLOBIN A1C  11/30/2023   Diabetic kidney evaluation - Urine ACR  05/31/2024   FOOT EXAM  06/15/2024   Diabetic kidney evaluation - eGFR measurement  07/21/2024   DTaP/Tdap/Td (3 - Td or Tdap) 05/10/2026   Colonoscopy  07/24/2028   Pneumonia Vaccine 58+ Years old  Completed   INFLUENZA VACCINE  Completed   Hepatitis C Screening  Completed   HPV VACCINES  Aged Out    Health Maintenance  Health Maintenance Due  Topic Date Due   DEXA SCAN  Never done   Medicare Annual Wellness (AWV)  Never done   COVID-19 Vaccine (1) Never done   OPHTHALMOLOGY EXAM  Never done   Zoster Vaccines- Shingrix (1 of 2) Never done   MAMMOGRAM  02/19/2023    Colorectal cancer screening: Type of screening: Colonoscopy. Completed 07/25/2023. Repeat every 5 years  Mammogram status: Ordered 09/28/2023. Pt provided with contact info and advised to call to schedule appt.   Bone Density status: Ordered 09/28/2023. Pt provided with contact info and advised to call to schedule appt.  Lung Cancer Screening: (Low Dose CT Chest recommended if Age 60-80 years, 20 pack-year currently smoking OR have quit w/in 15years.) does not qualify.   Lung Cancer Screening Referral: na  Additional Screening:  Hepatitis C Screening: does not qualify; Completed   Vision Screening: Recommended annual ophthalmology exams for early detection of glaucoma and other disorders of the eye. Is the patient up to date with their annual eye exam?  No  Who is the provider or what is the name of the office in which the patient attends annual eye exams? [facility] If pt is not established with a provider, would they like to be referred to a provider to establish care? No .   Dental Screening: Recommended annual dental exams for proper oral hygiene  Diabetic Foot Exam: na  Community Resource  Referral / Chronic Care Management: CRR required this visit?  No   CCM required this visit?  No     Plan:     I have personally reviewed and noted the following in the patient's chart:   Medical and social history Use of alcohol, tobacco or illicit drugs  Current medications and supplements including opioid prescriptions. Patient is currently taking opioid prescriptions. Information provided to patient regarding non-opioid alternatives. Patient advised to discuss non-opioid treatment plan with their provider. Functional ability and status Nutritional status Physical activity Advanced directives List of other physicians Hospitalizations, surgeries, and ER visits in previous 12 months Vitals Screenings to include cognitive, depression, and falls Referrals and appointments  In addition, I have reviewed and discussed with patient certain preventive protocols, quality metrics, and best practice recommendations. A written personalized care plan for preventive services as  well as general preventive health recommendations were provided to patient.     Jordan Hawks Aariz Maish, CMA   09/28/2023   After Visit Summary: (MyChart) Due to this being a telephonic visit, the after visit summary with patients personalized plan was offered to patient via MyChart

## 2023-09-28 NOTE — Patient Instructions (Signed)
Amy Livingston , Thank you for taking time to come for your Medicare Wellness Visit. I appreciate your ongoing commitment to your health goals. Please review the following plan we discussed and let me know if I can assist you in the future.   Referrals/Orders/Follow-Ups/Clinician Recommendations:   Next Medicare Annual Wellness Visit:  October 02, 2024 at 8:40 am virtual visit  An order has been placed for you to have your yearly mammogram. Please call the number below to schedule your appointment  [facility] at [facility] [address]iology [place], [place] [address] [phone]  Schedule your [place] screening mammogram through MyChart!   Log into your MyChart account.  Go to 'Visit' (or 'Appointments' if on mobile App) --> Schedule an Appointment  Under 'Select a Reason for Visit' choose the Mammogram Screening option.  Complete the pre-visit questions and select the time and place that best fits your schedule.  An order for a bone density scan has been placed for you today. Please call the number below to schedule your appt.   [facility] at [facility] [address] -Radiology [place], [place] [address] [phone] Make sure to wear two-piece clothing.  No lotions powders or deodorants the day of the appointment Make sure to bring picture ID and insurance card.  Bring list of medications you are currently taking including any supplements.       If lab work has been ordered for you today, you may have these drawn at the same lab you have your routine lab work drawn for your primary care provider.  Labs Ordered:    This is a list of the screening recommended for you and due dates:  Health Maintenance  Topic Date Due   DEXA scan (bone density measurement)  Never done   COVID-19 Vaccine (1) Never done   Eye exam for diabetics  Never done   Zoster (Shingles) Vaccine (1 of 2) Never done   Mammogram  02/19/2023   Hemoglobin A1C   11/30/2023   Yearly kidney health urinalysis for diabetes  05/31/2024   Complete foot exam   06/15/2024   Yearly kidney function blood test for diabetes  07/21/2024   Medicare Annual Wellness Visit  09/27/2024   DTaP/Tdap/Td vaccine (3 - Td or Tdap) 05/10/2026   Colon Cancer Screening  07/24/2028   Pneumonia Vaccine  Completed   Flu Shot  Completed   Hepatitis C Screening  Completed   HPV Vaccine  Aged Out    Advanced directives: (Provided) Advance directive discussed with you today. I have provided a copy for you to complete at home and have notarized. Once this is complete, please bring a copy in to our office so we can scan it into your chart.   Next Medicare Annual Wellness Visit scheduled for next year: yes   Understanding Your Risk for Falls Millions of people have serious injuries from falls each year. It is important to understand your risk of falling. Talk with your health care provider about your risk and what you can do to lower it. If you do have a serious fall, make sure to tell your provider. Falling once raises your risk of falling again. How can falls affect me? Serious injuries from falls are common. These include: Broken bones, such as hip fractures. Head injuries, such as traumatic brain injuries (TBI) or concussions. A fear of falling can cause you to avoid activities and stay at home. This can make your muscles weaker  and raise your risk for a fall. What can increase my risk? There are a number of risk factors that increase your risk for falling. The more risk factors you have, the higher your risk of falling. Serious injuries from a fall happen most often to people who are older than 67 years old. Teenagers and young adults ages 73-29 are also at higher risk. Common risk factors include: Weakness in the lower body. Being generally weak or confused due to long-term (chronic) illness. Dizziness or balance problems. Poor vision. Medicines that cause dizziness or  drowsiness. These may include: Medicines for your blood pressure, heart, anxiety, insomnia, or swelling (edema). Pain medicines. Muscle relaxants. Other risk factors include: Drinking alcohol. Having had a fall in the past. Having foot pain or wearing improper footwear. Working at a dangerous job. Having any of the following in your home: Tripping hazards, such as floor clutter or loose rugs. Poor lighting. Pets. Having dementia or memory loss. What actions can I take to lower my risk of falling?     Physical activity Stay physically fit. Do strength and balance exercises. Consider taking a regular class to build strength and balance. Yoga and tai chi are good options. Vision Have your eyes checked every year and your prescription for glasses or contacts updated as needed. Shoes and walking aids Wear non-skid shoes. Wear shoes that have rubber soles and low heels. Do not wear high heels. Do not walk around the house in socks or slippers. Use a cane or walker as told by your provider. Home safety Attach secure railings on both sides of your stairs. Install grab bars for your bathtub, shower, and toilet. Use a non-skid mat in your bathtub or shower. Attach bath mats securely with double-sided, non-slip rug tape. Use good lighting in all rooms. Keep a flashlight near your bed. Make sure there is a clear path from your bed to the bathroom. Use night-lights. Do not use throw rugs. Make sure all carpeting is taped or tacked down securely. Remove all clutter from walkways and stairways, including extension cords. Repair uneven or broken steps and floors. Avoid walking on icy or slippery surfaces. Walk on the grass instead of on icy or slick sidewalks. Use ice melter to get rid of ice on walkways in the winter. Use a cordless phone. Questions to ask your health care provider Can you help me check my risk for a fall? Do any of my medicines make me more likely to fall? Should I take a  vitamin D supplement? What exercises can I do to improve my strength and balance? Should I make an appointment to have my vision checked? Do I need a bone density test to check for weak bones (osteoporosis)? Would it help to use a cane or a walker? Where to find more information Centers for Disease Control and Prevention, STEADI: TonerPromos.no Community-Based Fall Prevention Programs: TonerPromos.no [employer] on Aging: BaseRingTones.pl Contact a health care provider if: You fall at home. You are afraid of falling at home. You feel weak, drowsy, or dizzy. This information is not intended to replace advice given to you by your health care provider. Make sure you discuss any questions you have with your health care provider. Document Revised: 03/29/2022 Document Reviewed: 03/29/2022 Elsevier Patient Education  2024 Elsevier Inc.   Managing Pain Without Opioids Opioids are strong medicines used to treat moderate to severe pain. For some people, especially those who have long-term (chronic) pain, opioids may not be the best choice for  pain management due to: Side effects like nausea, constipation, and sleepiness. The risk of addiction (opioid use disorder). The longer you take opioids, the greater your risk of addiction. Pain that lasts for more than 3 months is called chronic pain. Managing chronic pain usually requires more than one approach and is often provided by a team of health care providers working together (multidisciplinary approach). Pain management may be done at a pain management center or pain clinic. How to manage pain without the use of opioids Use non-opioid medicines Non-opioid medicines for pain may include: Over-the-counter or prescription non-steroidal anti-inflammatory drugs (NSAIDs). These may be the first medicines used for pain. They work well for muscle and bone pain, and they reduce swelling. Acetaminophen. This over-the-counter medicine may work well for milder pain but not  swelling. Antidepressants. These may be used to treat chronic pain. A certain type of antidepressant (tricyclics) is often used. These medicines are given in lower doses for pain than when used for depression. Anticonvulsants. These are usually used to treat seizures but may also reduce nerve (neuropathic) pain. Muscle relaxants. These relieve pain caused by sudden muscle tightening (spasms). You may also use a pain medicine that is applied to the skin as a patch, cream, or gel (topical analgesic), such as a numbing medicine. These may cause fewer side effects than medicines taken by mouth. Do certain therapies as directed Some therapies can help with pain management. They include: Physical therapy. You will do exercises to gain strength and flexibility. A physical therapist may teach you exercises to move and stretch parts of your body that are weak, stiff, or painful. You can learn these exercises at physical therapy visits and practice them at home. Physical therapy may also involve: Massage. Heat wraps or applying heat or cold to affected areas. Electrical signals that interrupt pain signals (transcutaneous electrical nerve stimulation, TENS). Weak lasers that reduce pain and swelling (low-level laser therapy). Signals from your body that help you learn to regulate pain (biofeedback). Occupational therapy. This helps you to learn ways to function at home and work with less pain. Recreational therapy. This involves trying new activities or hobbies, such as a physical activity or drawing. Mental health therapy, including: Cognitive behavioral therapy (CBT). This helps you learn coping skills for dealing with pain. Acceptance and commitment therapy (ACT) to change the way you think and react to pain. Relaxation therapies, including muscle relaxation exercises and mindfulness-based stress reduction. Pain management counseling. This may be individual, family, or group counseling.  Receive medical  treatments Medical treatments for pain management include: Nerve block injections. These may include a pain blocker and anti-inflammatory medicines. You may have injections: Near the spine to relieve chronic back or neck pain. Into joints to relieve back or joint pain. Into nerve areas that supply a painful area to relieve body pain. Into muscles (trigger point injections) to relieve some painful muscle conditions. A medical device placed near your spine to help block pain signals and relieve nerve pain or chronic back pain (spinal cord stimulation device). Acupuncture. Follow these instructions at home Medicines Take over-the-counter and prescription medicines only as told by your health care provider. If you are taking pain medicine, ask your health care providers about possible side effects to watch out for. Do not drive or use heavy machinery while taking prescription opioid pain medicine. Lifestyle  Do not use drugs or alcohol to reduce pain. If you drink alcohol, limit how much you have to: 0-1 drink a day for women  who are not pregnant. 0-2 drinks a day for men. Know how much alcohol is in a drink. In the [place] one drink equals one 12 oz bottle of beer (355 mL), one 5 oz glass of wine (148 mL), or one 1 oz glass of hard liquor (44 mL). Do not use any products that contain nicotine or tobacco. These products include cigarettes, chewing tobacco, and vaping devices, such as e-cigarettes. If you need help quitting, ask your health care provider. Eat a healthy diet and maintain a healthy weight. Poor diet and excess weight may make pain worse. Eat foods that are high in fiber. These include fresh fruits and vegetables, whole grains, and beans. Limit foods that are high in fat and processed sugars, such as fried and sweet foods. Exercise regularly. Exercise lowers stress and may help relieve pain. Ask your health care provider what activities and exercises are safe for you. If your health  care provider approves, join an exercise class that combines movement and stress reduction. Examples include yoga and tai chi. Get enough sleep. Lack of sleep may make pain worse. Lower stress as much as possible. Practice stress reduction techniques as told by your therapist. General instructions Work with all your pain management providers to find the treatments that work best for you. You are an important member of your pain management team. There are many things you can do to reduce pain on your own. Consider joining an online or in-person support group for people who have chronic pain. Keep all follow-up visits. This is important. Where to find more information You can find more information about managing pain without opioids from: American Academy of Pain Medicine: painmed.org Institute for Chronic Pain: instituteforchronicpain.org American Chronic Pain Association: theacpa.org Contact a health care provider if: You have side effects from pain medicine. Your pain gets worse or does not get better with treatments or home therapy. You are struggling with anxiety or depression. Summary Many types of pain can be managed without opioids. Chronic pain may respond better to pain management without opioids. Pain is best managed when you and a team of health care providers work together. Pain management without opioids may include non-opioid medicines, medical treatments, physical therapy, mental health therapy, and lifestyle changes. Tell your health care providers if your pain gets worse or is not being managed well enough. This information is not intended to replace advice given to you by your health care provider. Make sure you discuss any questions you have with your health care provider. Document Revised: 11/05/2020 Document Reviewed: 11/05/2020 Elsevier Patient Education  2024 [employer].

## 2023-09-28 NOTE — Telephone Encounter (Signed)
Copied from CRM [id number]. Topic: General - Call Back - No Documentation >> Sep 28, 2023 10:52 AM Higinio Roger wrote: Reason for CRM: [facility] has patient's Blood Glucose Monitoring Suppl DEVI. They stated they have been trying to reach patient since Nov 2024 and were unsuccessful. Please reach out to patient and also give a callback to [facility]. [facility] letting them know you were able to get reach patient. Callback #: [phone]

## 2023-10-06 ENCOUNTER — Encounter: Payer: Self-pay | Admitting: *Deleted

## 2023-10-06 ENCOUNTER — Ambulatory Visit: Payer: Self-pay | Admitting: *Deleted

## 2023-10-06 NOTE — Patient Outreach (Signed)
 Care Coordination   Follow Up Visit Note   10/06/2023 Name: Xiamara Hulet MRN: [id number] DOB: 29-May-1956  Cumi Sanagustin is a 68 y.o. year old female who sees Gilmore Laroche, FNP for primary care. I spoke with  Gordy Clement by phone today. Patient is very motivated to manage diabetes and decrease risk for complications. She is very interested in participating in an exercise program and speaking with a dietician.   What matters to the patients health and wellness today?  Increasing physical activity, lowering blood sugar, finding cause of presyncopal symptoms and preventing falls.    Goals Addressed             This Visit's Progress    Manage Afib       Care Management Goals: Patient will talk with cardiologist at next visit about a referral to establish care with a cardiologist in [facility] PCP aware and is agreeable. Appt. Note added as reminder Patient will review handout on Watchmen Device Patient will discuss Watchmen device as a potential replacement for Eliquis to decrease risk for blood clots since she has a high risk for falls and increased bleeding risk with Eliquis Patient will reach out to RN Care Manager as needed [phone]       Manage Diabetes [facility])   On track    Care Coordination Goals: Patient will take medication as prescribed and reach out to provider with any negative side effects Start Mounjaro 5mg  weekly on 10/11/23. Double the dose of the two 2.5mg  pens that she has now and then pickup the new prescription for 5mg  at the pharmacy.  Patient will begin using the Freestyle Libre Continuous Glucose monitor to monitor blood sugar twice a day and as needed Patient will utilize skin tac or sensor cover if sensors are falling off before 14 days Patient will replace sensor with a new sensor if it falls off before 14 days and reach out to [employer] for a replacement. If that was the last sensor, request an expedited shipment.  Patient will reach out to  provider with readings less than 70 or for 3 readings in a row over 200 Patient will take blood sugar log and meter to provider visits for review Patient will keep appointment with registered dietician and follow dietary recommendations General recommendation: Avoid sugary drinks and foods. Eat 3 meals per day with 30 grams of carbohydrates and up to 2 snacks per day, if needed, with less than 15 grams of carbohydrates Patient will discuss referral for PREP program at [facility] Appt. Note added   Patient will take shoes and socks off at PCP/endocrinology visits for foot exams Patient will consider having a podiatrist or other professional trim her toenails to prevent injury Patient will schedule yearly eye exams to check for or monitor diabetic retinopathy Patient will reach out to RN Care Manager [phone] with any care coordination or resource needs      Manage Dizziness and Prevent Falls   On track    Care Coordination Goals: Patient will use assistive devices as needed for balance Patient will take medication as directed Patient will move carefully and change positions slowly to minimize risk for falling Patient will notify provider of any falls and will seek emergency medical attention if needed Patient will seek emergency medical attention if she hits her head Patient will talk with PCP about a cardiology referral to establish care with a new cardiologist in [place] Patient will monitor and record blood pressure and take log to  office visit for review Patient will monitor blood sugar at least twice a day and as needed if symptomatic  Patient will discuss episodes of dizziness and presyncopal symptoms with provider. Further testing may be needed. Patient will reach out to RN Care Manager at [phone] with any care coordination or resource needs         SDOH assessments and interventions completed:  No     Care Coordination Interventions:  Yes, provided   Interventions Today    Flowsheet Row Most Recent Value  Chronic Disease   Chronic disease during today's visit Diabetes, Atrial Fibrillation (AFib), Other  [high risk for falls]  General Interventions   General Interventions Discussed/Reviewed General Interventions Discussed, General Interventions Reviewed, Labs, Durable Medical Equipment (DME), Doctor Visits  Labs Hgb A1c every 3 months, Kidney Function  Doctor Visits Discussed/Reviewed Doctor Visits Discussed, Doctor Visits Reviewed, PCP, Specialist  Durable Medical Equipment (DME) Glucomoter  [Prescribed Freeestyle Libre Continuous Glucose Monitor. Has not started using it but has the instructions & feels confident she can apply it correctly & start using it. Blood sugar is ranging between 90 and 210. Does not have 3 readings in a row over 200]  PCP/Specialist Visits Compliance with follow-up visit  [PCP 10/13/23, Registered Dietician 10/17/23, PharmD 11/02/23, endocrinologist 12/08/23]  Exercise Interventions   Exercise Discussed/Reviewed Exercise Discussed, Exercise Reviewed, Physical Activity  Physical Activity Discussed/Reviewed Physical Activity Discussed, Physical Activity Reviewed, PREP  [Appointment note added requesting a PREP referral to [facility]. Program explained to patient and she is very interested.]  Weight Management Weight maintenance  [has intentionally lost over 100 lbs over the past year through medication and diet changes. Would like to increase muscle mass through exercise.]  Education Interventions   Education Provided Provided Education, Provided Printed Education  [printed education on Watchmen device to look over and discuss with cardiologist once appointment is scheduled.]  Provided Verbal Education On Nutrition, Labs, Blood Sugar Monitoring, Exercise, Medication, When to see the doctor, Other  [discussed bleeding risks associated with Eliquis & potential for complications due to high risk for falls. Provided verbal  and printed education on Watchmen device and encouraged to discuss with provider. Educated on [place] and sensors.]  Nutrition Interventions   Nutrition Discussed/Reviewed Nutrition Discussed, Nutrition Reviewed, Carbohydrate meal planning, Adding fruits and vegetables, Increasing proteins, Decreasing sugar intake, Portion sizes, Fluid intake  [3 meals per day with 30 GM of CHO and up to 2 snacks per day with 15 GM of CHO if needed]  Pharmacy Interventions   Pharmacy Dicussed/Reviewed Pharmacy Topics Discussed, Pharmacy Topics Reviewed, Medications and their functions  [reviewed Clinical Pharmacist's note and reviewed medication changes with patient. Updated medication list. Patient will begin Mounjaro 5mg  with next injection. Holding Lanuts. D/C metformin. Eliquis will Incr. risk of bleeding if she has a head injury.]  Safety Interventions   Safety Discussed/Reviewed Safety Discussed, Safety Reviewed, Home Safety, Fall Risk  [has not fallen since last telephone follow-up. Discussed fall precautions and potential causes. Move carefully and change positions slowly to decrease risk of falls.]  Home Safety Assistive Devices      Follow up plan: Follow up call scheduled for 10/26/23    Encounter Outcome:  Patient Visit Completed   Demetrios Loll, RN, BSN [place]  [facility], [facility] RN Care Manager Direct Dial: [phone]

## 2023-10-08 ENCOUNTER — Emergency Department (HOSPITAL_COMMUNITY)

## 2023-10-08 ENCOUNTER — Other Ambulatory Visit: Payer: Self-pay

## 2023-10-08 ENCOUNTER — Encounter (HOSPITAL_COMMUNITY): Payer: Self-pay | Admitting: Emergency Medicine

## 2023-10-08 ENCOUNTER — Inpatient Hospital Stay (HOSPITAL_COMMUNITY)
Admission: EM | Admit: 2023-10-08 | Discharge: 2023-10-14 | DRG: 871 | Disposition: A | Discharge status: Home Health | Attending: Family Medicine | Admitting: Family Medicine

## 2023-10-08 DIAGNOSIS — E876 Hypokalemia: Secondary | ICD-10-CM | POA: Diagnosis present

## 2023-10-08 DIAGNOSIS — G8929 Other chronic pain: Secondary | ICD-10-CM | POA: Diagnosis present

## 2023-10-08 DIAGNOSIS — Z9049 Acquired absence of other specified parts of digestive tract: Secondary | ICD-10-CM

## 2023-10-08 DIAGNOSIS — Z7985 Long-term (current) use of injectable non-insulin antidiabetic drugs: Secondary | ICD-10-CM

## 2023-10-08 DIAGNOSIS — Z1152 Encounter for screening for COVID-19: Secondary | ICD-10-CM | POA: Diagnosis not present

## 2023-10-08 DIAGNOSIS — Z8719 Personal history of other diseases of the digestive system: Secondary | ICD-10-CM | POA: Diagnosis not present

## 2023-10-08 DIAGNOSIS — N136 Pyonephrosis: Secondary | ICD-10-CM | POA: Diagnosis present

## 2023-10-08 DIAGNOSIS — R0902 Hypoxemia: Secondary | ICD-10-CM | POA: Diagnosis present

## 2023-10-08 DIAGNOSIS — E871 Hypo-osmolality and hyponatremia: Secondary | ICD-10-CM | POA: Diagnosis present

## 2023-10-08 DIAGNOSIS — Z833 Family history of diabetes mellitus: Secondary | ICD-10-CM | POA: Diagnosis not present

## 2023-10-08 DIAGNOSIS — Z79899 Other long term (current) drug therapy: Secondary | ICD-10-CM

## 2023-10-08 DIAGNOSIS — R55 Syncope and collapse: Secondary | ICD-10-CM | POA: Diagnosis not present

## 2023-10-08 DIAGNOSIS — E039 Hypothyroidism, unspecified: Secondary | ICD-10-CM | POA: Diagnosis present

## 2023-10-08 DIAGNOSIS — M549 Dorsalgia, unspecified: Secondary | ICD-10-CM | POA: Diagnosis present

## 2023-10-08 DIAGNOSIS — Z7901 Long term (current) use of anticoagulants: Secondary | ICD-10-CM

## 2023-10-08 DIAGNOSIS — Z604 Social exclusion and rejection: Secondary | ICD-10-CM | POA: Diagnosis present

## 2023-10-08 DIAGNOSIS — N390 Urinary tract infection, site not specified: Secondary | ICD-10-CM | POA: Diagnosis present

## 2023-10-08 DIAGNOSIS — I10 Essential (primary) hypertension: Secondary | ICD-10-CM | POA: Diagnosis present

## 2023-10-08 DIAGNOSIS — Z794 Long term (current) use of insulin: Secondary | ICD-10-CM | POA: Diagnosis not present

## 2023-10-08 DIAGNOSIS — K625 Hemorrhage of anus and rectum: Secondary | ICD-10-CM | POA: Diagnosis not present

## 2023-10-08 DIAGNOSIS — I700 Atherosclerosis of aorta: Secondary | ICD-10-CM | POA: Diagnosis present

## 2023-10-08 DIAGNOSIS — A411 Sepsis due to other specified staphylococcus: Secondary | ICD-10-CM | POA: Diagnosis present

## 2023-10-08 DIAGNOSIS — F32A Depression, unspecified: Secondary | ICD-10-CM | POA: Diagnosis present

## 2023-10-08 DIAGNOSIS — K648 Other hemorrhoids: Secondary | ICD-10-CM | POA: Diagnosis present

## 2023-10-08 DIAGNOSIS — K219 Gastro-esophageal reflux disease without esophagitis: Secondary | ICD-10-CM | POA: Diagnosis present

## 2023-10-08 DIAGNOSIS — E785 Hyperlipidemia, unspecified: Secondary | ICD-10-CM | POA: Diagnosis present

## 2023-10-08 DIAGNOSIS — E1165 Type 2 diabetes mellitus with hyperglycemia: Secondary | ICD-10-CM | POA: Diagnosis present

## 2023-10-08 DIAGNOSIS — K5731 Diverticulosis of large intestine without perforation or abscess with bleeding: Secondary | ICD-10-CM | POA: Diagnosis present

## 2023-10-08 DIAGNOSIS — A09 Infectious gastroenteritis and colitis, unspecified: Secondary | ICD-10-CM | POA: Diagnosis not present

## 2023-10-08 DIAGNOSIS — A0472 Enterocolitis due to Clostridium difficile, not specified as recurrent: Secondary | ICD-10-CM

## 2023-10-08 DIAGNOSIS — G629 Polyneuropathy, unspecified: Secondary | ICD-10-CM

## 2023-10-08 DIAGNOSIS — F419 Anxiety disorder, unspecified: Secondary | ICD-10-CM | POA: Insufficient documentation

## 2023-10-08 DIAGNOSIS — G609 Hereditary and idiopathic neuropathy, unspecified: Secondary | ICD-10-CM | POA: Diagnosis not present

## 2023-10-08 DIAGNOSIS — R7881 Bacteremia: Secondary | ICD-10-CM | POA: Diagnosis not present

## 2023-10-08 DIAGNOSIS — E1140 Type 2 diabetes mellitus with diabetic neuropathy, unspecified: Secondary | ICD-10-CM | POA: Diagnosis present

## 2023-10-08 DIAGNOSIS — Z5941 Food insecurity: Secondary | ICD-10-CM

## 2023-10-08 DIAGNOSIS — Z981 Arthrodesis status: Secondary | ICD-10-CM | POA: Diagnosis not present

## 2023-10-08 DIAGNOSIS — I480 Paroxysmal atrial fibrillation: Secondary | ICD-10-CM | POA: Diagnosis present

## 2023-10-08 DIAGNOSIS — Z888 Allergy status to other drugs, medicaments and biological substances status: Secondary | ICD-10-CM

## 2023-10-08 DIAGNOSIS — A4150 Gram-negative sepsis, unspecified: Secondary | ICD-10-CM | POA: Diagnosis present

## 2023-10-08 DIAGNOSIS — E861 Hypovolemia: Secondary | ICD-10-CM | POA: Diagnosis present

## 2023-10-08 DIAGNOSIS — E860 Dehydration: Secondary | ICD-10-CM | POA: Diagnosis present

## 2023-10-08 DIAGNOSIS — Z800 Family history of malignant neoplasm of digestive organs: Secondary | ICD-10-CM

## 2023-10-08 DIAGNOSIS — B9689 Other specified bacterial agents as the cause of diseases classified elsewhere: Secondary | ICD-10-CM

## 2023-10-08 DIAGNOSIS — Z90710 Acquired absence of both cervix and uterus: Secondary | ICD-10-CM

## 2023-10-08 DIAGNOSIS — Z803 Family history of malignant neoplasm of breast: Secondary | ICD-10-CM

## 2023-10-08 DIAGNOSIS — I4891 Unspecified atrial fibrillation: Secondary | ICD-10-CM | POA: Diagnosis not present

## 2023-10-08 DIAGNOSIS — A419 Sepsis, unspecified organism: Principal | ICD-10-CM

## 2023-10-08 LAB — COMPREHENSIVE METABOLIC PANEL
ALT: 20 U/L (ref 0–44)
AST: 22 U/L (ref 15–41)
Albumin: 3.2 g/dL — ABNORMAL LOW (ref 3.5–5.0)
Alkaline Phosphatase: 173 U/L — ABNORMAL HIGH (ref 38–126)
Anion gap: 11 (ref 5–15)
BUN: 23 mg/dL (ref 8–23)
CO2: 20 mmol/L — ABNORMAL LOW (ref 22–32)
Calcium: 9 mg/dL (ref 8.9–10.3)
Chloride: 101 mmol/L (ref 98–111)
Creatinine, Ser: 0.93 mg/dL (ref 0.44–1.00)
GFR, Estimated: >60 mL/min (ref >60)
Glucose, Bld: 436 mg/dL — ABNORMAL HIGH (ref 70–99)
Potassium: 3.4 mmol/L — ABNORMAL LOW (ref 3.5–5.1)
Sodium: 132 mmol/L — ABNORMAL LOW (ref 135–145)
Total Bilirubin: 0.6 mg/dL (ref 0.0–1.2)
Total Protein: 6.5 g/dL (ref 6.5–8.1)

## 2023-10-08 LAB — AMMONIA: Ammonia: 16 umol/L (ref 9–35)

## 2023-10-08 LAB — ETHANOL: Alcohol, Ethyl (B): <10 mg/dL (ref <10)

## 2023-10-08 LAB — RESP PANEL BY RT-PCR (RSV, FLU A&B, COVID)  RVPGX2
Influenza A by PCR: NEGATIVE
Influenza B by PCR: NEGATIVE
Resp Syncytial Virus by PCR: NEGATIVE
SARS Coronavirus 2 by RT PCR: NEGATIVE

## 2023-10-08 LAB — RAPID URINE DRUG SCREEN, HOSP PERFORMED
Amphetamines: NOT DETECTED
Barbiturates: NOT DETECTED
Benzodiazepines: NOT DETECTED
Cocaine: NOT DETECTED
Opiates: NOT DETECTED
Tetrahydrocannabinol: NOT DETECTED

## 2023-10-08 LAB — BRAIN NATRIURETIC PEPTIDE: B Natriuretic Peptide: 39 pg/mL (ref 0.0–100.0)

## 2023-10-08 LAB — BLOOD GAS, VENOUS
Acid-Base Excess: 0.6 mmol/L (ref 0.0–2.0)
Bicarbonate: 27.4 mmol/L (ref 20.0–28.0)
Drawn by: 1517
O2 Saturation: 34.2 %
Patient temperature: 36.4
pCO2, Ven: 51 mmHg (ref 44–60)
pH, Ven: 7.34 (ref 7.25–7.43)
pO2, Ven: <31 mmHg — CL (ref 32–45)

## 2023-10-08 LAB — URINALYSIS, W/ REFLEX TO CULTURE (INFECTION SUSPECTED)
Bilirubin Urine: NEGATIVE
Glucose, UA: >=500 mg/dL — AB
Ketones, ur: NEGATIVE mg/dL
Nitrite: NEGATIVE
Protein, ur: 30 mg/dL — AB
Specific Gravity, Urine: 1.016 (ref 1.005–1.030)
WBC, UA: >50 WBC/hpf (ref 0–5)
pH: 5 (ref 5.0–8.0)

## 2023-10-08 LAB — CBC
HCT: 38.7 % (ref 36.0–46.0)
Hemoglobin: 12.9 g/dL (ref 12.0–15.0)
MCH: 30.2 pg (ref 26.0–34.0)
MCHC: 33.3 g/dL (ref 30.0–36.0)
MCV: 90.6 fL (ref 80.0–100.0)
Platelets: 253 10*3/uL (ref 150–400)
RBC: 4.27 MIL/uL (ref 3.87–5.11)
RDW: 13.7 % (ref 11.5–15.5)
WBC: 10.1 10*3/uL (ref 4.0–10.5)
nRBC: 0 % (ref 0.0–0.2)

## 2023-10-08 LAB — LACTIC ACID, PLASMA
Lactic Acid, Venous: 3.9 mmol/L (ref 0.5–1.9)
Lactic Acid, Venous: 2.9 mmol/L (ref 0.5–1.9)

## 2023-10-08 LAB — LIPASE, BLOOD: Lipase: 35 U/L (ref 11–51)

## 2023-10-08 LAB — TROPONIN I (HIGH SENSITIVITY)
Troponin I (High Sensitivity): 3 ng/L (ref <18)
Troponin I (High Sensitivity): 3 ng/L (ref <18)

## 2023-10-08 LAB — PROTIME-INR
INR: 1 (ref 0.8–1.2)
Prothrombin Time: 13.4 s (ref 11.4–15.2)

## 2023-10-08 LAB — CBG MONITORING, ED: Glucose-Capillary: 366 mg/dL — ABNORMAL HIGH (ref 70–99)

## 2023-10-08 MED ORDER — OXYCODONE-ACETAMINOPHEN 5-325 MG PO TABS
1.0000 | ORAL_TABLET | Freq: Three times a day (TID) | ORAL | Status: DC | PRN
  Administered 2023-10-09 – 2023-10-10 (×4): 2 via ORAL
  Administered 2023-10-11 (×2): 1 via ORAL
  Administered 2023-10-11 – 2023-10-14 (×5): 2 via ORAL
  Filled 2023-10-08 (×3): qty 2
  Filled 2023-10-08: qty 1
  Filled 2023-10-08 (×3): qty 2
  Filled 2023-10-08: qty 1
  Filled 2023-10-08 (×3): qty 2

## 2023-10-08 MED ORDER — ONDANSETRON HCL 4 MG/2ML IJ SOLN
4.0000 mg | Freq: Four times a day (QID) | INTRAMUSCULAR | Status: DC | PRN
  Administered 2023-10-10 – 2023-10-11 (×3): 4 mg via INTRAVENOUS
  Filled 2023-10-08 (×4): qty 2

## 2023-10-08 MED ORDER — GABAPENTIN 300 MG PO CAPS
300.0000 mg | ORAL_CAPSULE | Freq: Three times a day (TID) | ORAL | Status: DC
  Administered 2023-10-09 – 2023-10-14 (×17): 300 mg via ORAL
  Filled 2023-10-08 (×17): qty 1

## 2023-10-08 MED ORDER — LACTATED RINGERS IV BOLUS
1000.0000 mL | Freq: Once | INTRAVENOUS | Status: AC
  Administered 2023-10-08: 1000 mL via INTRAVENOUS

## 2023-10-08 MED ORDER — ACETAMINOPHEN 325 MG PO TABS
650.0000 mg | ORAL_TABLET | Freq: Four times a day (QID) | ORAL | Status: DC | PRN
  Administered 2023-10-13: 650 mg via ORAL
  Filled 2023-10-08: qty 2

## 2023-10-08 MED ORDER — BUSPIRONE HCL 5 MG PO TABS
10.0000 mg | ORAL_TABLET | Freq: Two times a day (BID) | ORAL | Status: DC
  Administered 2023-10-09 – 2023-10-14 (×12): 10 mg via ORAL
  Filled 2023-10-08 (×12): qty 2

## 2023-10-08 MED ORDER — NOREPINEPHRINE 4 MG/250ML-% IV SOLN
0.0000 ug/min | INTRAVENOUS | Status: DC
Start: 2023-10-08 — End: 2023-10-09
  Administered 2023-10-08: 2 ug/min via INTRAVENOUS
  Filled 2023-10-08: qty 250

## 2023-10-08 MED ORDER — TRAZODONE HCL 50 MG PO TABS
25.0000 mg | ORAL_TABLET | Freq: Every evening | ORAL | Status: DC | PRN
  Administered 2023-10-09 – 2023-10-13 (×5): 25 mg via ORAL
  Filled 2023-10-08 (×5): qty 1

## 2023-10-08 MED ORDER — LISINOPRIL 5 MG PO TABS
5.0000 mg | ORAL_TABLET | Freq: Every day | ORAL | Status: DC
  Filled 2023-10-08: qty 1

## 2023-10-08 MED ORDER — INSULIN GLARGINE 100 UNIT/ML ~~LOC~~ SOLN
10.0000 [IU] | Freq: Every day | SUBCUTANEOUS | Status: DC
Start: 2023-10-08 — End: 2023-10-09
  Filled 2023-10-08: qty 0.1

## 2023-10-08 MED ORDER — METOPROLOL TARTRATE 50 MG PO TABS
50.0000 mg | ORAL_TABLET | Freq: Two times a day (BID) | ORAL | Status: DC
  Administered 2023-10-09 – 2023-10-14 (×11): 50 mg via ORAL
  Filled 2023-10-08 (×7): qty 1
  Filled 2023-10-08: qty 2
  Filled 2023-10-08 (×3): qty 1

## 2023-10-08 MED ORDER — LACTATED RINGERS IV SOLN: INTRAVENOUS | Status: AC

## 2023-10-08 MED ORDER — PANTOPRAZOLE SODIUM 40 MG PO TBEC: 40.0000 mg | DELAYED_RELEASE_TABLET | Freq: Every day | ORAL | Status: DC

## 2023-10-08 MED ORDER — MECLIZINE HCL 12.5 MG PO TABS: 25.0000 mg | ORAL_TABLET | Freq: Every day | ORAL | Status: DC | PRN

## 2023-10-08 MED ORDER — ATORVASTATIN CALCIUM 40 MG PO TABS
80.0000 mg | ORAL_TABLET | Freq: Every day | ORAL | Status: DC
  Administered 2023-10-09 – 2023-10-14 (×6): 80 mg via ORAL
  Filled 2023-10-08 (×6): qty 2

## 2023-10-08 MED ORDER — LACTATED RINGERS IV BOLUS (SEPSIS)
1000.0000 mL | Freq: Once | INTRAVENOUS | Status: AC
  Administered 2023-10-08: 1000 mL via INTRAVENOUS

## 2023-10-08 MED ORDER — ACETAMINOPHEN 650 MG RE SUPP: 650.0000 mg | Freq: Four times a day (QID) | RECTAL | Status: DC | PRN

## 2023-10-08 MED ORDER — VANCOMYCIN HCL IN DEXTROSE 1-5 GM/200ML-% IV SOLN
1000.0000 mg | Freq: Once | INTRAVENOUS | Status: AC
  Administered 2023-10-08: 1000 mg via INTRAVENOUS
  Filled 2023-10-08: qty 200

## 2023-10-08 MED ORDER — SODIUM CHLORIDE 0.9 % IV SOLN
2.0000 g | Freq: Once | INTRAVENOUS | Status: AC
  Administered 2023-10-08: 2 g via INTRAVENOUS
  Filled 2023-10-08: qty 12.5

## 2023-10-08 MED ORDER — ONDANSETRON HCL 4 MG PO TABS: 4.0000 mg | ORAL_TABLET | Freq: Four times a day (QID) | ORAL | Status: DC | PRN

## 2023-10-08 MED ORDER — FLUCONAZOLE 150 MG PO TABS
150.0000 mg | ORAL_TABLET | Freq: Once | ORAL | Status: AC
  Administered 2023-10-08: 150 mg via ORAL
  Filled 2023-10-08: qty 1

## 2023-10-08 MED ORDER — METRONIDAZOLE 500 MG/100ML IV SOLN
500.0000 mg | Freq: Once | INTRAVENOUS | Status: AC
  Administered 2023-10-08: 500 mg via INTRAVENOUS
  Filled 2023-10-08: qty 100

## 2023-10-08 MED ORDER — MAGNESIUM HYDROXIDE 400 MG/5ML PO SUSP: 30.0000 mL | Freq: Every day | ORAL | Status: DC | PRN

## 2023-10-08 MED ORDER — LACTATED RINGERS IV BOLUS
500.0000 mL | Freq: Once | INTRAVENOUS | Status: AC
  Administered 2023-10-08: 500 mL via INTRAVENOUS

## 2023-10-08 MED ORDER — SODIUM CHLORIDE 0.9 % IV SOLN
2.0000 g | INTRAVENOUS | Status: DC
  Administered 2023-10-09 – 2023-10-11 (×3): 2 g via INTRAVENOUS
  Filled 2023-10-08 (×3): qty 20

## 2023-10-08 MED ORDER — SERTRALINE HCL 50 MG PO TABS
50.0000 mg | ORAL_TABLET | Freq: Every day | ORAL | Status: DC
  Administered 2023-10-09 – 2023-10-14 (×6): 50 mg via ORAL
  Filled 2023-10-08 (×6): qty 1

## 2023-10-08 MED ORDER — APIXABAN 5 MG PO TABS
5.0000 mg | ORAL_TABLET | Freq: Two times a day (BID) | ORAL | Status: DC
  Administered 2023-10-09 (×2): 5 mg via ORAL
  Filled 2023-10-08 (×2): qty 1

## 2023-10-08 MED ORDER — LACTATED RINGERS IV SOLN
150.0000 mL/h | INTRAVENOUS | Status: AC
  Administered 2023-10-09 (×2): 150 mL/h via INTRAVENOUS

## 2023-10-08 MED ORDER — MIRABEGRON ER 25 MG PO TB24
25.0000 mg | ORAL_TABLET | Freq: Every day | ORAL | Status: DC
Start: 2023-10-09 — End: 2023-10-14
  Administered 2023-10-09 – 2023-10-14 (×6): 25 mg via ORAL
  Filled 2023-10-08 (×6): qty 1

## 2023-10-08 NOTE — Assessment & Plan Note (Signed)
 -  We will continue Synthroid.

## 2023-10-08 NOTE — ED Notes (Signed)
 This RN stopped the Norepinephrine due to meeting the parameters of the medication to be stopped. MD Dixion was made aware as well.

## 2023-10-08 NOTE — Assessment & Plan Note (Signed)
-   The will be placed on resistant NovoLog protocol for supplemental coverage. - We will hold off metformin. - Will continue basal coverage.

## 2023-10-08 NOTE — ED Notes (Signed)
 Date and time results received: 10/08/23 2208 (use smartphrase ".now" to insert current time)  Test: Lactic acid Critical Value: 2.9  Name of Provider Notified: MD Dixion  Orders Received? Or Actions Taken?:

## 2023-10-08 NOTE — Assessment & Plan Note (Signed)
-   Potassium will be replaced and magnesium level will be checked. ?

## 2023-10-08 NOTE — Assessment & Plan Note (Signed)
-   We will continue BuSpar. ?

## 2023-10-08 NOTE — Assessment & Plan Note (Signed)
-   This is manifested by tachycardia and tachypnea. - The patient will be admitted to a medical telemetry bed. - Will continue antibiotic therapy with IV Rocephin. - We will follow blood and urine cultures. - The patient will be hydrated with IV lactated ringer.

## 2023-10-08 NOTE — Assessment & Plan Note (Signed)
-   This is likely hypovolemic and is associated with volume depletion and mild dehydration. - The patient will be hydrated as mentioned above and will follow BMP.

## 2023-10-08 NOTE — ED Notes (Addendum)
 Pt had a BM upon arrival and it was a firm stool sample at this time. Unable to get a C-diff sample at this time

## 2023-10-08 NOTE — Assessment & Plan Note (Signed)
 -  We will continue Neurontin. ?

## 2023-10-08 NOTE — H&P (Incomplete)
 [place]   PATIENT NAME: Amy Livingston    MR#:  [id number]  DATE OF BIRTH:  21-Jan-1956  DATE OF ADMISSION:  10/08/2023  PRIMARY CARE PHYSICIAN: Gilmore Laroche, FNP   Patient is coming from: Home  REQUESTING/REFERRING PHYSICIAN: Gloris Manchester, MD  CHIEF COMPLAINT:   Chief Complaint  Patient presents with   Altered Mental Status    HISTORY OF PRESENT ILLNESS:  Amy Livingston is a 68 y.o. Caucasian female with medical history significant for anxiety, atrial fibrillation, type diabetes mellitus, dysrhythmia, GERD, and essential hypertension, who presented to the emergency room with acute onset of generalized weakness tonight and an episode of syncope when she was on the toilet this evening required increased assistance from her daughter to even stand up.  Her daughter witnessed to have her having a syncopal episode at the time that lasted 10 to 20 seconds.  There was no head injury or other injuries.  She had mild twitching during the episode.  She had a quick return of mental status baseline afterwards.  She denied any history of seizures.  She was noted to be mildly hypoxic on arrival to the hospital.  She was complaining of chronic back pain.  No paresthesias or focal muscle weakness.  No chest pain or palpitations.  No cough or wheezing or dyspnea.  Per her daughter she has been having visual hesitations intermittently since January.  She is also been having episodes of syncope when she stands up from sitting or lying down position.  ED Course: When the she came to the ER, temperature was 97.5 and heart rate was 118 with otherwise normal vital signs.  Labs revealed a pH of 7.34 and HCO3 of 27.4.  CMP revealed mild hyponatremia and hypokalemia, hyperglycemia of 436 and CO2 of 21 with alk phos 173 and albumin 3.2.  Lipase was 35 and ammonia level 16.  High sensitive troponin I was 3 twice and BNP 39.  Lactic acid was 3.9 and later 2.9.  CBC was within normal.  Respiratory panel came  back negative.  UA was positive for UTI.  Urine drug screen was negative.  Alcohol level was less than 10.  Blood culture was drawn as well as urine culture. EKG as reviewed by me :  EKG showed sinus tachycardia with a rate of 117 with borderline prolonged QT interval with QTc of 494 mL. Imaging: None Acid CT scan revealed no acute intracranial abnormalities.  Portable chest x-ray showed aortic atherosclerosis with no acute cardiopulmonary disease.  The patient was given IV cefepime, Flagyl and 2.5 L of IV lactated ringer bolus followed by 150 mL/h as well as 1 p.o. fluconazole.  She was placed on IV Levophed briefly that was later tapered off with improved blood pressure.  The patient will be admitted to a medical telemetry bed for further evaluation and management. PAST MEDICAL HISTORY:   Past Medical History:  Diagnosis Date   Anxiety    Atrial fibrillation (HCC)    Diabetes mellitus without complication (HCC)    Dysrhythmia    GERD (gastroesophageal reflux disease)    Hypertension    PONV (postoperative nausea and vomiting)    Shortness of breath dyspnea     PAST SURGICAL HISTORY:   Past Surgical History:  Procedure Laterality Date   ABDOMINAL HYSTERECTOMY     ANTERIOR CERVICAL DECOMP/DISCECTOMY FUSION N/A 01/13/2016   Procedure: C5-6 ANTERIOR CERVICAL DECOMPRESSION/DISKECTOMY/FUSION;  Surgeon: Barnett Abu, MD;  Location: [facility];  Service: Neurosurgery;  Laterality: N/A;  C5-6 ANTERIOR CERVICAL DECOMPRESSION/DISKECTOMY/FUSION   BACK SURGERY     x 5   BIOPSY  07/25/2023   Procedure: BIOPSY;  Surgeon: Lanelle Bal, DO;  Location: [facility];  Service: Endoscopy;;   CHOLECYSTECTOMY     COLONOSCOPY WITH PROPOFOL N/A 07/25/2023   Procedure: COLONOSCOPY WITH PROPOFOL;  Surgeon: Lanelle Bal, DO;  Location: [facility];  Service: Endoscopy;  Laterality: N/A;  10:45 am, asa 3   ESOPHAGEAL BRUSHING  07/25/2023   Procedure: ESOPHAGEAL BRUSHING;  Surgeon: Lanelle Bal, DO;  Location: [facility];  Service: Endoscopy;;   ESOPHAGOGASTRODUODENOSCOPY (EGD) WITH PROPOFOL N/A 07/25/2023   Procedure: ESOPHAGOGASTRODUODENOSCOPY (EGD) WITH PROPOFOL;  Surgeon: Lanelle Bal, DO;  Location: [facility];  Service: Endoscopy;  Laterality: N/A;   FOOT SURGERY     KNEE ARTHROSCOPY     left   KNEE SURGERY Right    POLYPECTOMY  07/25/2023   Procedure: POLYPECTOMY INTESTINAL;  Surgeon: Lanelle Bal, DO;  Location: [facility];  Service: Endoscopy;;   TUBAL LIGATION      SOCIAL HISTORY:   Social History   Tobacco Use   Smoking status: Never   Smokeless tobacco: Never  Substance Use Topics   Alcohol use: No    FAMILY HISTORY:   Positive for colon cancer, diabetes mellitus and TIA in her father and breast cancer in her mother.  DRUG ALLERGIES:   Allergies  Allergen Reactions   Elemental Sulfur Itching and Rash    Yeast infections   Metformin And Related Diarrhea    GI distress    REVIEW OF SYSTEMS:   ROS As per history of present illness. All pertinent systems were reviewed above. Constitutional, HEENT, cardiovascular, respiratory, GI, GU, musculoskeletal, neuro, psychiatric, endocrine, integumentary and hematologic systems were reviewed and are otherwise negative/unremarkable except for positive findings mentioned above in the HPI.   MEDICATIONS AT HOME:   Prior to Admission medications   Medication Sig Start Date End Date Taking? Authorizing Provider  atorvastatin (LIPITOR) 80 MG tablet Take 1 tablet (80 mg total) by mouth daily. 06/07/23   Gilmore Laroche, FNP  Blood Glucose Monitoring Suppl DEVI 1 each by Does not apply route in the morning, at noon, and at bedtime. May substitute to any manufacturer covered by patient's insurance. 06/07/23   Gilmore Laroche, FNP  busPIRone (BUSPAR) 10 MG tablet Take 1 tablet (10 mg total) by mouth 2 (two) times daily. 06/16/23   Gilmore Laroche, FNP  Cholecalciferol (VITAMIN D) 50 MCG  (2000 UT) CAPS Take by mouth.    [provider]  Continuous Glucose Sensor (FREESTYLE LIBRE 3 SENSOR) MISC Place 1 sensor on the skin every 14 days. Use to check glucose continuously 06/26/23   Gilmore Laroche, FNP  ELIQUIS 5 MG TABS tablet Take 5 mg by mouth 2 (two) times daily.    [provider]  gabapentin (NEURONTIN) 300 MG capsule Take 1 capsule (300 mg total) by mouth 3 (three) times daily. 09/22/23   Gilmore Laroche, FNP  insulin aspart (NOVOLOG) 100 UNIT/ML injection ADMINISTER 20 UNITS UNDER THE SKIN THREE TIMES DAILY BEFORE MEALS 02/10/22   [provider]  insulin glargine (LANTUS) 100 UNIT/ML injection Inject 0.1 mLs (10 Units total) into the skin at bedtime. Patient not taking: Reported on 10/06/2023 09/08/23   Gilmore Laroche, FNP  insulin NPH-regular Human (70-30) 100 UNIT/ML injection Inject 40 Units into the skin 2 (two) times daily with a meal.  [provider]  Insulin Pen Needle (NOVOFINE PEN NEEDLE) 32G X 6 MM MISC USE TO INJECT INSULIN FOUR TIMES DAILY 02/15/22   [provider]  Insulin Syringe-Needle U-100 (BD INSULIN SYRINGE U/F) 31G X 5/16" 1 ML MISC USE FOR INSULIN 09/21/23   Gilmore Laroche, FNP  lisinopril (ZESTRIL) 5 MG tablet Take 1 tablet (5 mg total) by mouth daily. 06/07/23   Gilmore Laroche, FNP  meclizine (ANTIVERT) 25 MG tablet Take 25 mg by mouth daily as needed for dizziness.  Patient not taking: Reported on 10/06/2023    [provider]  metFORMIN (GLUCOPHAGE) 1000 MG tablet Take 1 tablet (1,000 mg total) by mouth 2 (two) times daily with a meal. 06/07/23   Gilmore Laroche, FNP  metoprolol tartrate (LOPRESSOR) 50 MG tablet Take 50 mg by mouth 2 (two) times daily. 01/21/20   [provider]  mirabegron ER (MYRBETRIQ) 25 MG TB24 tablet Take 1 tablet (25 mg total) by mouth daily. 06/16/23   Gilmore Laroche, FNP  nitrofurantoin, macrocrystal-monohydrate, (MACROBID) 100 MG capsule Take 1 capsule (100 mg total)  by mouth 2 (two) times daily. 08/17/23   Leath-Warren, Sadie Haber, NP  omeprazole (PRILOSEC) 40 MG capsule Take 1 capsule (40 mg total) by mouth daily. 09/08/23   Gilmore Laroche, FNP  oxyCODONE-acetaminophen (PERCOCET/ROXICET) 5-325 MG tablet Take 1-2 tablets by mouth every 4 (four) hours as needed for moderate pain. Patient taking differently: Take 1-2 tablets by mouth every 8 (eight) hours as needed for moderate pain (pain score 4-6). 01/14/16   Barnett Abu, MD  RESTASIS 0.05 % ophthalmic emulsion 1 drop 2 (two) times daily. 02/11/20   [provider]  sertraline (ZOLOFT) 50 MG tablet Take 1 tablet (50 mg total) by mouth daily. 06/16/23   Gilmore Laroche, FNP  tirzepatide [facility]) 5 MG/0.5ML Pen Inject 5 mg into the skin once a week. 09/28/23   Gilmore Laroche, FNP      VITAL SIGNS:  Blood pressure (!) 117/97, pulse 100, temperature (!) 97.5 F (36.4 C), temperature source Oral, resp. rate 20, height 5\' 6"  (1.676 m), weight 95 kg, SpO2 100%.  PHYSICAL EXAMINATION:  Physical Exam  GENERAL:  68 y.o.-year-old Caucasian female patient lying in the bed with no acute distress.  EYES: Pupils equal, round, reactive to light and accommodation. No scleral icterus. Extraocular muscles intact.  HEENT: Head atraumatic, normocephalic. Oropharynx and nasopharynx clear.  NECK:  Supple, no jugular venous distention. No thyroid enlargement, no tenderness.  LUNGS: Normal breath sounds bilaterally, no wheezing, rales,rhonchi or crepitation. No use of accessory muscles of respiration.  CARDIOVASCULAR: Regular rate and rhythm, S1, S2 normal. No murmurs, rubs, or gallops.  ABDOMEN: Soft, nondistended, nontender. Bowel sounds present. No organomegaly or mass.  EXTREMITIES: No pedal edema, cyanosis, or clubbing.  NEUROLOGIC: Cranial nerves II through XII are intact. Muscle strength 5/5 in all extremities. Sensation intact. Gait not checked.  PSYCHIATRIC: The patient is alert and oriented x 3.  Normal affect  and good eye contact. SKIN: No obvious rash, lesion, or ulcer.   LABORATORY PANEL:   CBC Recent Labs  Lab 10/08/23 1927  WBC 10.1  HGB 12.9  HCT 38.7  PLT 253   ------------------------------------------------------------------------------------------------------------------  Chemistries  Recent Labs  Lab 10/08/23 1927  NA 132*  K 3.4*  CL 101  CO2 20*  GLUCOSE 436*  BUN 23  CREATININE 0.93  CALCIUM 9.0  AST 22  ALT 20  ALKPHOS 173*  BILITOT 0.6   ------------------------------------------------------------------------------------------------------------------  Cardiac Enzymes No  results for input(s): "TROPONINI" in the last 168 hours. ------------------------------------------------------------------------------------------------------------------  RADIOLOGY:  DG Chest Port 1 View Result Date: 10/08/2023 CLINICAL DATA:  Questionable sepsis - evaluate for abnormality EXAM: PORTABLE CHEST 1 VIEW COMPARISON:  CT chest 10/08/2023 FINDINGS: The heart and mediastinal contours are within normal limits. Other sclerotic plaque. No focal consolidation. No pulmonary edema. No pleural effusion. No pneumothorax. No acute osseous abnormality. IMPRESSION: 1. No active disease. 2.  Aortic Atherosclerosis (ICD10-I70.0). Electronically Signed   By: Tish Frederickson M.D.   On: 10/08/2023 21:26   CT Head Wo Contrast Result Date: 10/08/2023 CLINICAL DATA:  Mental status change, unknown cause EXAM: CT HEAD WITHOUT CONTRAST TECHNIQUE: Contiguous axial images were obtained from the base of the skull through the vertex without intravenous contrast. RADIATION DOSE REDUCTION: This exam was performed according to the departmental dose-optimization program which includes automated exposure control, adjustment of the mA and/or kV according to patient size and/or use of iterative reconstruction technique. COMPARISON:  CT head 06/11/2013 report without imaging. FINDINGS: Brain: No evidence of  large-territorial acute infarction. No parenchymal hemorrhage. No mass lesion. No extra-axial collection. No mass effect or midline shift. No hydrocephalus. Basilar cisterns are patent. Vascular: No hyperdense vessel. Atherosclerotic calcifications are present within the cavernous internal carotid arteries. Skull: No acute fracture or focal lesion. Sinuses/Orbits: Paranasal sinuses and mastoid air cells are clear. The orbits are unremarkable. Other: None. IMPRESSION: No acute intracranial abnormality. Electronically Signed   By: Tish Frederickson M.D.   On: 10/08/2023 21:26   CT CHEST ABDOMEN PELVIS WO CONTRAST Result Date: 10/08/2023 CLINICAL DATA:  Sepsis.  Prior UTIs EXAM: CT CHEST, ABDOMEN AND PELVIS WITHOUT CONTRAST TECHNIQUE: Multidetector CT imaging of the chest, abdomen and pelvis was performed following the standard protocol without IV contrast. RADIATION DOSE REDUCTION: This exam was performed according to the departmental dose-optimization program which includes automated exposure control, adjustment of the mA and/or kV according to patient size and/or use of iterative reconstruction technique. COMPARISON:  None Available. FINDINGS: CT CHEST FINDINGS Cardiovascular: Normal heart size. No significant pericardial effusion. The thoracic aorta is normal in caliber. Mild atherosclerotic plaque of the thoracic aorta. At least 3 vessel coronary artery calcifications. Mediastinum/Nodes: No enlarged mediastinal, hilar, or axillary lymph nodes. Thyroid gland, trachea, and esophagus demonstrate no significant findings. Tiny hiatal hernia for in Lungs/Pleura: Diffuse mild bronchial wall thickening. No focal consolidation. No pulmonary nodule. No pulmonary mass. No pleural effusion. No pneumothorax. Musculoskeletal: No chest wall abnormality. No suspicious lytic or blastic osseous lesions. No acute displaced fracture. Cervical spine surgical hardware. CT ABDOMEN PELVIS FINDINGS Hepatobiliary: No focal liver  abnormality. Status post cholecystectomy. No biliary dilatation. Pancreas: No focal lesion. Slight haziness of the proximal pancreatic contour with trace fat stranding. No surrounding inflammatory changes. No main pancreatic ductal dilatation. Spleen: Normal in size without focal abnormality. Adrenals/Urinary Tract: No adrenal nodule bilaterally. Mild left hydroureteronephrosis. No right hydroureteronephrosis. No nephroureterolithiasis bilaterally. Urinary bladder wall circumferential urinary bladder wall thickening and mild perivesicular fat stranding. Stomach/Bowel: Stomach is within normal limits. No evidence of bowel wall thickening or dilatation. Colonic diverticulosis. Appendix appears normal. Vascular/Lymphatic: No abdominal aorta or iliac aneurysm. Severe atherosclerotic plaque of the aorta and its branches. No abdominal, pelvic, or inguinal lymphadenopathy. Reproductive: Status post hysterectomy. No adnexal masses. Other: No intraperitoneal free fluid. No intraperitoneal free gas. No organized fluid collection. Musculoskeletal: No abdominal wall hernia or abnormality. No suspicious lytic or blastic osseous lesions. No acute displaced fracture. Diffusely decreased bone density. L3-L4 posterolateral surgical hardware  fusion. Multilevel degenerative changes spine. IMPRESSION: 1. No acute intrathoracic abnormality. 2. Cystitis. 3. Mild left hydroureteronephrosis. This may reflect the residual of a recently passed calculus or reflect chronic changes of obstructive uropathy or reflux. 4. Question mild acute pancreatitis.  Correlate with lipase levels. 5. Colonic diverticulosis with no acute diverticulitis. 6. Limited evaluation on this noncontrast study. Electronically Signed   By: Tish Frederickson M.D.   On: 10/08/2023 21:24      IMPRESSION AND PLAN:  Assessment and Plan: * Sepsis due to gram-negative UTI (HCC) - This is manifested by tachycardia and tachypnea. - The patient will be admitted to a medical  telemetry bed. - Will continue antibiotic therapy with IV Rocephin. - We will follow blood and urine cultures. - The patient will be hydrated with IV lactated ringer.  Uncontrolled type 2 diabetes mellitus with hyperglycemia, with long-term current use of insulin (HCC) - The will be placed on resistant NovoLog protocol for supplemental coverage. - We will hold off metformin. - Will continue basal coverage.  Hypokalemia - Potassium will be replaced and magnesium level will be checked.  Syncope and collapse - This is highly suspicious for orthostatic hypotension - Will check orthostatics q 12 hours. - The patient will be hydrated with IV lactated ringer as mentioned above. -Differential diagnoses would additionally include neurally mediated syncope, cardiogenic, arrhythmias related, and less likely hypoglycemia.  Hyponatremia - This is likely hypovolemic and is associated with volume depletion and mild dehydration. - The patient will be hydrated as mentioned above and will follow BMP.  Anxiety and depression - We will continue BuSpar and Zoloft.  Paroxysmal atrial fibrillation (HCC) - We will continue Eliquis.  Peripheral neuropathy - We will continue Neurontin.  Dyslipidemia - We will continue Synthroid.   DVT prophylaxis: Lovenox.  Advanced Care Planning:  Code Status: full code.  Family Communication:  The plan of care was discussed in details with the patient (and family). I answered all questions. The patient agreed to proceed with the above mentioned plan. Further management will depend upon hospital course. Disposition Plan: Back to previous home environment Consults called: none.  All the records are reviewed and case discussed with ED provider.  Status is: Inpatient  At the time of the admission, it appears that the appropriate admission status for this patient is inpatient.  This is judged to be reasonable and necessary in order to provide the required intensity  of service to ensure the patient's safety given the presenting symptoms, physical exam findings and initial radiographic and laboratory data in the context of comorbid conditions.  The patient requires inpatient status due to high intensity of service, high risk of further deterioration and high frequency of surveillance required.  I certify that at the time of admission, it is my clinical judgment that the patient will require inpatient hospital care extending more than 2 midnights.                            Dispo: The patient is from: Home              Anticipated d/c is to: Home              Patient currently is not medically stable to d/c.              Difficult to place patient: No  Hannah Beat M.D on 10/09/2023 at 12:30 AM  [facility]   From 7 PM-7  AM, contact night-coverage [email or web address]  CC: Primary care physician; Gilmore Laroche, FNP

## 2023-10-08 NOTE — Progress Notes (Signed)
 CODE SEPSIS - PHARMACY COMMUNICATION  **Broad Spectrum Antibiotics should be administered within 1 hour of Sepsis diagnosis**  Time Code Sepsis Called/Page Received: 1930  Antibiotics Ordered: cefepime, vancomycin and metronidazole  Time of 1st antibiotic administration: 1946  Additional action taken by pharmacy: None  If necessary, Name of Provider/Nurse Contacted: None    Rockwell Alexandria ,PharmD Clinical Pharmacist  10/08/2023  7:38 PM

## 2023-10-08 NOTE — Sepsis Progress Note (Signed)
 Elink following for sepsis protocol.

## 2023-10-08 NOTE — ED Provider Notes (Signed)
 [place] [facility]MENT AT [facility] Provider Note   CSN: [id number] Arrival date & time: 10/08/23  1912     History  Chief Complaint  Patient presents with   Altered Mental Status    Amy Livingston is a 68 y.o. female.   Altered Mental Status Presenting symptoms: confusion   Associated symptoms: weakness (Generalized)   Patient presents for generalized weakness and syncope.  Medical history includes atrial fibrillation, DM, anxiety, HTN, GERD.  She seemed to be in her normal state of health earlier today.  While on the toilet this evening, she had generalized weakness.  She required increased assistance from her daughter to even stand up.  Daughter witnessed her have a syncopal episode at the time.  It lasted for 10 to 20 seconds.  She had some mild twitching during this episode.  She did have quick return to mental baseline afterwards.  She has no known history of seizures.  EMS noted mild hypoxia during transit.  On arrival, patient endorsing chronic back pain but denying any other areas of discomfort.       Home Medications Prior to Admission medications   Medication Sig Start Date End Date Taking? Authorizing Provider  atorvastatin (LIPITOR) 80 MG tablet Take 1 tablet (80 mg total) by mouth daily. 06/07/23   Gilmore Laroche, FNP  Blood Glucose Monitoring Suppl DEVI 1 each by Does not apply route in the morning, at noon, and at bedtime. May substitute to any manufacturer covered by patient's insurance. 06/07/23   Gilmore Laroche, FNP  busPIRone (BUSPAR) 10 MG tablet Take 1 tablet (10 mg total) by mouth 2 (two) times daily. 06/16/23   Gilmore Laroche, FNP  Cholecalciferol (VITAMIN D) 50 MCG (2000 UT) CAPS Take by mouth.    [provider]  Continuous Glucose Sensor (FREESTYLE LIBRE 3 SENSOR) MISC Place 1 sensor on the skin every 14 days. Use to check glucose continuously 06/26/23   Gilmore Laroche, FNP  ELIQUIS 5 MG TABS tablet Take 5 mg by mouth 2 (two)  times daily.    [provider]  gabapentin (NEURONTIN) 300 MG capsule Take 1 capsule (300 mg total) by mouth 3 (three) times daily. 09/22/23   Gilmore Laroche, FNP  insulin aspart (NOVOLOG) 100 UNIT/ML injection ADMINISTER 20 UNITS UNDER THE SKIN THREE TIMES DAILY BEFORE MEALS 02/10/22   [provider]  insulin glargine (LANTUS) 100 UNIT/ML injection Inject 0.1 mLs (10 Units total) into the skin at bedtime. Patient not taking: Reported on 10/06/2023 09/08/23   Gilmore Laroche, FNP  insulin NPH-regular Human (70-30) 100 UNIT/ML injection Inject 40 Units into the skin 2 (two) times daily with a meal.    [provider]  Insulin Pen Needle (NOVOFINE PEN NEEDLE) 32G X 6 MM MISC USE TO INJECT INSULIN FOUR TIMES DAILY 02/15/22   [provider]  Insulin Syringe-Needle U-100 (BD INSULIN SYRINGE U/F) 31G X 5/16" 1 ML MISC USE FOR INSULIN 09/21/23   Gilmore Laroche, FNP  lisinopril (ZESTRIL) 5 MG tablet Take 1 tablet (5 mg total) by mouth daily. 06/07/23   Gilmore Laroche, FNP  meclizine (ANTIVERT) 25 MG tablet Take 25 mg by mouth daily as needed for dizziness.  Patient not taking: Reported on 10/06/2023    [provider]  metFORMIN (GLUCOPHAGE) 1000 MG tablet Take 1 tablet (1,000 mg total) by mouth 2 (two) times daily with a meal. 06/07/23   Gilmore Laroche, FNP  metoprolol tartrate (LOPRESSOR) 50 MG tablet Take 50 mg by mouth  2 (two) times daily. 01/21/20   [provider]  mirabegron ER (MYRBETRIQ) 25 MG TB24 tablet Take 1 tablet (25 mg total) by mouth daily. 06/16/23   Gilmore Laroche, FNP  nitrofurantoin, macrocrystal-monohydrate, (MACROBID) 100 MG capsule Take 1 capsule (100 mg total) by mouth 2 (two) times daily. 08/17/23   Leath-Warren, Sadie Haber, NP  omeprazole (PRILOSEC) 40 MG capsule Take 1 capsule (40 mg total) by mouth daily. 09/08/23   Gilmore Laroche, FNP  oxyCODONE-acetaminophen (PERCOCET/ROXICET) 5-325 MG tablet Take 1-2 tablets by mouth every 4  (four) hours as needed for moderate pain. Patient taking differently: Take 1-2 tablets by mouth every 8 (eight) hours as needed for moderate pain (pain score 4-6). 01/14/16   Barnett Abu, MD  RESTASIS 0.05 % ophthalmic emulsion 1 drop 2 (two) times daily. 02/11/20   [provider]  sertraline (ZOLOFT) 50 MG tablet Take 1 tablet (50 mg total) by mouth daily. 06/16/23   Gilmore Laroche, FNP  tirzepatide [facility]) 5 MG/0.5ML Pen Inject 5 mg into the skin once a week. 09/28/23   Gilmore Laroche, FNP      Allergies    Elemental sulfur and Metformin and related    Review of Systems   Review of Systems  Constitutional:  Positive for fatigue.  Neurological:  Positive for syncope and weakness (Generalized).  Psychiatric/Behavioral:  Positive for confusion.   All other systems reviewed and are negative.   Physical Exam Updated Vital Signs BP (!) 117/97   Pulse 100   Temp (!) 97.5 F (36.4 C) (Oral)   Resp 20   Ht 5\' 6"  (1.676 m)   Wt 95 kg   SpO2 100%   BMI 33.80 kg/m  Physical Exam Vitals and nursing note reviewed.  Constitutional:      General: She is not in acute distress.    Appearance: She is well-developed. She is ill-appearing. She is not toxic-appearing or diaphoretic.  HENT:     Head: Normocephalic and atraumatic.     Right Ear: External ear normal.     Left Ear: External ear normal.     Nose: Nose normal.     Mouth/Throat:     Mouth: Mucous membranes are dry.  Eyes:     Extraocular Movements: Extraocular movements intact.     Conjunctiva/sclera: Conjunctivae normal.  Cardiovascular:     Rate and Rhythm: Regular rhythm. Tachycardia present.     Heart sounds: No murmur heard. Pulmonary:     Effort: Pulmonary effort is normal. No respiratory distress.     Breath sounds: Normal breath sounds. No wheezing or rales.  Chest:     Chest wall: No tenderness.  Abdominal:     General: There is no distension.     Palpations: Abdomen is soft.     Tenderness: There is  abdominal tenderness. There is no guarding or rebound.  Musculoskeletal:        General: No swelling or deformity.     Cervical back: Normal range of motion and neck supple.  Skin:    General: Skin is warm and dry.     Coloration: Skin is not jaundiced or pale.  Neurological:     General: No focal deficit present.     Mental Status: She is alert and oriented to person, place, and time.  Psychiatric:        Mood and Affect: Mood normal.        Behavior: Behavior normal.     ED Results / Procedures / Treatments  Labs (all labs ordered are listed, but only abnormal results are displayed) Labs Reviewed  COMPREHENSIVE METABOLIC PANEL - Abnormal; Notable for the following components:      Result Value   Sodium 132 (*)    Potassium 3.4 (*)    CO2 20 (*)    Glucose, Bld 436 (*)    Albumin 3.2 (*)    Alkaline Phosphatase 173 (*)    All other components within normal limits  LACTIC ACID, PLASMA - Abnormal; Notable for the following components:   Lactic Acid, Venous 3.9 (*)    All other components within normal limits  LACTIC ACID, PLASMA - Abnormal; Notable for the following components:   Lactic Acid, Venous 2.9 (*)    All other components within normal limits  BLOOD GAS, VENOUS - Abnormal; Notable for the following components:   pO2, Ven <31 (*)    All other components within normal limits  URINALYSIS, W/ REFLEX TO CULTURE (INFECTION SUSPECTED) - Abnormal; Notable for the following components:   APPearance HAZY (*)    Glucose, UA >=500 (*)    Hgb urine dipstick MODERATE (*)    Protein, ur 30 (*)    Leukocytes,Ua SMALL (*)    Bacteria, UA MANY (*)    All other components within normal limits  CBG MONITORING, ED - Abnormal; Notable for the following components:   Glucose-Capillary 366 (*)    All other components within normal limits  RESP PANEL BY RT-PCR (RSV, FLU A&B, COVID)  [id number]  CULTURE, BLOOD (ROUTINE X 2)  CULTURE, BLOOD (ROUTINE X 2)  C DIFFICILE QUICK SCREEN W PCR  REFLEX    URINE CULTURE  CBC  PROTIME-INR  BRAIN NATRIURETIC PEPTIDE  ETHANOL  RAPID URINE DRUG SCREEN, HOSP PERFORMED  AMMONIA  LIPASE, BLOOD  HIV ANTIBODY (ROUTINE TESTING W REFLEX)  PROTIME-INR  CORTISOL-AM, BLOOD  BASIC METABOLIC PANEL  CBC  CBG MONITORING, ED  TROPONIN I (HIGH SENSITIVITY)  TROPONIN I (HIGH SENSITIVITY)    EKG EKG Interpretation Date/Time:  Saturday October 08 2023 19:26:33 EST Ventricular Rate:  117 PR Interval:  133 QRS Duration:  90 QT Interval:  354 QTC Calculation: 494 R Axis:   18  Text Interpretation: Sinus tachycardia Minimal ST depression, diffuse leads Borderline prolonged QT interval Confirmed by Gloris Manchester [phone]) on 10/08/2023 9:59:46 PM  Radiology DG Chest Port 1 View Result Date: 10/08/2023 CLINICAL DATA:  Questionable sepsis - evaluate for abnormality EXAM: PORTABLE CHEST 1 VIEW COMPARISON:  CT chest 10/08/2023 FINDINGS: The heart and mediastinal contours are within normal limits. Other sclerotic plaque. No focal consolidation. No pulmonary edema. No pleural effusion. No pneumothorax. No acute osseous abnormality. IMPRESSION: 1. No active disease. 2.  Aortic Atherosclerosis (ICD10-I70.0). Electronically Signed   By: Tish Frederickson M.D.   On: 10/08/2023 21:26   CT Head Wo Contrast Result Date: 10/08/2023 CLINICAL DATA:  Mental status change, unknown cause EXAM: CT HEAD WITHOUT CONTRAST TECHNIQUE: Contiguous axial images were obtained from the base of the skull through the vertex without intravenous contrast. RADIATION DOSE REDUCTION: This exam was performed according to the departmental dose-optimization program which includes automated exposure control, adjustment of the mA and/or kV according to patient size and/or use of iterative reconstruction technique. COMPARISON:  CT head 06/11/2013 report without imaging. FINDINGS: Brain: No evidence of large-territorial acute infarction. No parenchymal hemorrhage. No mass lesion. No extra-axial collection.  No mass effect or midline shift. No hydrocephalus. Basilar cisterns are patent. Vascular: No hyperdense vessel. Atherosclerotic calcifications are present  within the cavernous internal carotid arteries. Skull: No acute fracture or focal lesion. Sinuses/Orbits: Paranasal sinuses and mastoid air cells are clear. The orbits are unremarkable. Other: None. IMPRESSION: No acute intracranial abnormality. Electronically Signed   By: Tish Frederickson M.D.   On: 10/08/2023 21:26   CT CHEST ABDOMEN PELVIS WO CONTRAST Result Date: 10/08/2023 CLINICAL DATA:  Sepsis.  Prior UTIs EXAM: CT CHEST, ABDOMEN AND PELVIS WITHOUT CONTRAST TECHNIQUE: Multidetector CT imaging of the chest, abdomen and pelvis was performed following the standard protocol without IV contrast. RADIATION DOSE REDUCTION: This exam was performed according to the departmental dose-optimization program which includes automated exposure control, adjustment of the mA and/or kV according to patient size and/or use of iterative reconstruction technique. COMPARISON:  None Available. FINDINGS: CT CHEST FINDINGS Cardiovascular: Normal heart size. No significant pericardial effusion. The thoracic aorta is normal in caliber. Mild atherosclerotic plaque of the thoracic aorta. At least 3 vessel coronary artery calcifications. Mediastinum/Nodes: No enlarged mediastinal, hilar, or axillary lymph nodes. Thyroid gland, trachea, and esophagus demonstrate no significant findings. Tiny hiatal hernia for in Lungs/Pleura: Diffuse mild bronchial wall thickening. No focal consolidation. No pulmonary nodule. No pulmonary mass. No pleural effusion. No pneumothorax. Musculoskeletal: No chest wall abnormality. No suspicious lytic or blastic osseous lesions. No acute displaced fracture. Cervical spine surgical hardware. CT ABDOMEN PELVIS FINDINGS Hepatobiliary: No focal liver abnormality. Status post cholecystectomy. No biliary dilatation. Pancreas: No focal lesion. Slight haziness of the  proximal pancreatic contour with trace fat stranding. No surrounding inflammatory changes. No main pancreatic ductal dilatation. Spleen: Normal in size without focal abnormality. Adrenals/Urinary Tract: No adrenal nodule bilaterally. Mild left hydroureteronephrosis. No right hydroureteronephrosis. No nephroureterolithiasis bilaterally. Urinary bladder wall circumferential urinary bladder wall thickening and mild perivesicular fat stranding. Stomach/Bowel: Stomach is within normal limits. No evidence of bowel wall thickening or dilatation. Colonic diverticulosis. Appendix appears normal. Vascular/Lymphatic: No abdominal aorta or iliac aneurysm. Severe atherosclerotic plaque of the aorta and its branches. No abdominal, pelvic, or inguinal lymphadenopathy. Reproductive: Status post hysterectomy. No adnexal masses. Other: No intraperitoneal free fluid. No intraperitoneal free gas. No organized fluid collection. Musculoskeletal: No abdominal wall hernia or abnormality. No suspicious lytic or blastic osseous lesions. No acute displaced fracture. Diffusely decreased bone density. L3-L4 posterolateral surgical hardware fusion. Multilevel degenerative changes spine. IMPRESSION: 1. No acute intrathoracic abnormality. 2. Cystitis. 3. Mild left hydroureteronephrosis. This may reflect the residual of a recently passed calculus or reflect chronic changes of obstructive uropathy or reflux. 4. Question mild acute pancreatitis.  Correlate with lipase levels. 5. Colonic diverticulosis with no acute diverticulitis. 6. Limited evaluation on this noncontrast study. Electronically Signed   By: Tish Frederickson M.D.   On: 10/08/2023 21:24    Procedures Procedures    Medications Ordered in ED Medications  lactated ringers infusion ( Intravenous New Bag/Given 10/08/23 2130)  norepinephrine (LEVOPHED) 4mg  in (0.016 mg/mL) premix infusion (0 mcg/min Intravenous Stopped 10/08/23 2141)  oxyCODONE-acetaminophen (PERCOCET/ROXICET)  5-325 MG per tablet 1-2 tablet (has no administration in time range)  atorvastatin (LIPITOR) tablet 80 mg (has no administration in time range)  lisinopril (ZESTRIL) tablet 5 mg (has no administration in time range)  metoprolol tartrate (LOPRESSOR) tablet 50 mg (has no administration in time range)  busPIRone (BUSPAR) tablet 10 mg (has no administration in time range)  sertraline (ZOLOFT) tablet 50 mg (has no administration in time range)  insulin glargine (LANTUS) injection 10 Units (has no administration in time range)  meclizine (ANTIVERT) tablet 25 mg (has no administration  in time range)  pantoprazole (PROTONIX) EC tablet 40 mg (has no administration in time range)  mirabegron ER (MYRBETRIQ) tablet 25 mg (has no administration in time range)  apixaban (ELIQUIS) tablet 5 mg (has no administration in time range)  gabapentin (NEURONTIN) capsule 300 mg (has no administration in time range)  lactated ringers infusion (has no administration in time range)  cefTRIAXone (ROCEPHIN) 2 g in sodium chloride 0.9 % 100 mL IVPB (has no administration in time range)  acetaminophen (TYLENOL) tablet 650 mg (has no administration in time range)    Or  acetaminophen (TYLENOL) suppository 650 mg (has no administration in time range)  traZODone (DESYREL) tablet 25 mg (has no administration in time range)  magnesium hydroxide (MILK OF MAGNESIA) suspension 30 mL (has no administration in time range)  ondansetron (ZOFRAN) tablet 4 mg (has no administration in time range)    Or  ondansetron (ZOFRAN) injection 4 mg (has no administration in time range)  lactated ringers bolus 1,000 mL (0 mLs Intravenous Stopped 10/08/23 2035)  ceFEPIme (MAXIPIME) 2 g in sodium chloride 0.9 % 100 mL IVPB (0 g Intravenous Stopped 10/08/23 2040)  metroNIDAZOLE (FLAGYL) IVPB 500 mg (0 mg Intravenous Stopped 10/08/23 2115)  vancomycin (VANCOCIN) IVPB 1000 mg/200 mL premix (0 mg Intravenous Stopped 10/08/23 2140)  lactated ringers bolus 1,000  mL (0 mLs Intravenous Stopped 10/08/23 2115)  lactated ringers bolus 500 mL (500 mLs Intravenous New Bag/Given 10/08/23 2139)  fluconazole (DIFLUCAN) tablet 150 mg (150 mg Oral Given 10/08/23 2219)    ED Course/ Medical Decision Making/ A&P                                 Medical Decision Making Amount and/or Complexity of Data Reviewed Labs: ordered. Radiology: ordered.  Risk Prescription drug management. Decision regarding hospitalization.   This patient presents to the ED for concern of syncope and generalized weakness, this involves an extensive number of treatment options, and is a complaint that carries with it a high risk of complications and morbidity.  The differential diagnosis includes infection, dehydration, anemia, metabolic derangements, polypharmacy   Co morbidities that complicate the patient evaluation  atrial fibrillation, DM, anxiety, HTN, GERD   Additional history obtained:  Additional history obtained from patient's family, EMS External records from outside source obtained and reviewed including EMR   Lab Tests:  I Ordered, and personally interpreted labs.  The pertinent results include: Normal hemoglobin, no leukocytosis, hyperglycemia without evidence of DKA, slight hypokalemia with otherwise normal electrolytes.  Initial lactate is elevated consistent with sepsis.  BNP and troponin are normal.  Urinalysis does show evidence of UTI.  Budding yeast present as well.   Imaging Studies ordered:  I ordered imaging studies including chest x-ray, CT of head, chest, abdomen, pelvis I independently visualized and interpreted imaging which showed cystitis, mild left hydroureteronephrosis suggesting possibly recently passed stone and/or chronic change, possible mild acute pancreatitis, no other acute findings. I agree with the radiologist interpretation   Cardiac Monitoring: / EKG:  The patient was maintained on a cardiac monitor.  I personally viewed and  interpreted the cardiac monitored which showed an underlying rhythm of: Sinus rhythm   Problem List / ED Course / Critical interventions / Medication management  Patient presenting for onset of generalized weakness tonight in addition to his syncopal episode that was witnessed by family member.  On arrival in the ED, patient is ill-appearing.  She is  found to be tachycardic.  Although she initially endorses chronic back pain only, she does have abdominal tenderness present.  Septic workup and treatment was initiated.  Urine was obtained with In-N-Out catheter.  Urinalysis does show acute infection.  Budding yeast were also present and Diflucan was ordered.  Patient underwent CT imaging which did show some mild left hydroureteronephrosis.  This may suggest a recently passed stone.  Fortunately, there is no current obstructive uropathy.  While in the ED, patient had multiple bowel movements.  With this, her blood pressure did drop.  She was placed on Levophed.  As IV hydration continued, she was weaned off of Levophed.  Patient currently maintaining normal blood pressures.  Patient to be admitted for further management. I ordered medication including IV fluids and broad-spectrum antibiotics for empiric treatment of sepsis; Levophed for hypotension; Diflucan for candiduria Reevaluation of the patient after these medicines showed that the patient improved I have reviewed the patients home medicines and have made adjustments as needed   Social Determinants of Health:  Lives at home with family  CRITICAL CARE Performed by: Gloris Manchester   Total critical care time: 34 minutes  Critical care time was exclusive of separately billable procedures and treating other patients.  Critical care was necessary to treat or prevent imminent or life-threatening deterioration.  Critical care was time spent personally by me on the following activities: development of treatment plan with patient and/or surrogate as  well as nursing, discussions with consultants, evaluation of patient's response to treatment, examination of patient, obtaining history from patient or surrogate, ordering and performing treatments and interventions, ordering and review of laboratory studies, ordering and review of radiographic studies, pulse oximetry and re-evaluation of patient's condition.        Final Clinical Impression(s) / ED Diagnoses Final diagnoses:  Sepsis, due to unspecified organism, unspecified whether acute organ dysfunction present [facility])  Urinary tract infection without hematuria, site unspecified  Syncope, unspecified syncope type    Rx / DC Orders ED Discharge Orders     None         Gloris Manchester, MD 10/08/23 2339

## 2023-10-08 NOTE — ED Triage Notes (Signed)
 Pt bib EMS after family called with c/o AMS. Family reported to EMS that pt is "prone to UTI's". EMS states pt normally able to complete ADL's but that pt needed much assistance to get off toilet when they arrived. EMS also reported O2 sats of 88% on R/A upon their arrival and that they placed pt on O2 @ 100% NRB.

## 2023-10-09 DIAGNOSIS — I480 Paroxysmal atrial fibrillation: Secondary | ICD-10-CM | POA: Diagnosis not present

## 2023-10-09 DIAGNOSIS — K625 Hemorrhage of anus and rectum: Secondary | ICD-10-CM

## 2023-10-09 DIAGNOSIS — Z8719 Personal history of other diseases of the digestive system: Secondary | ICD-10-CM

## 2023-10-09 DIAGNOSIS — A09 Infectious gastroenteritis and colitis, unspecified: Secondary | ICD-10-CM

## 2023-10-09 DIAGNOSIS — A0472 Enterocolitis due to Clostridium difficile, not specified as recurrent: Secondary | ICD-10-CM | POA: Diagnosis not present

## 2023-10-09 DIAGNOSIS — E876 Hypokalemia: Secondary | ICD-10-CM | POA: Diagnosis not present

## 2023-10-09 DIAGNOSIS — A4150 Gram-negative sepsis, unspecified: Secondary | ICD-10-CM | POA: Diagnosis not present

## 2023-10-09 DIAGNOSIS — R55 Syncope and collapse: Secondary | ICD-10-CM

## 2023-10-09 DIAGNOSIS — F419 Anxiety disorder, unspecified: Secondary | ICD-10-CM | POA: Insufficient documentation

## 2023-10-09 DIAGNOSIS — F32A Depression, unspecified: Secondary | ICD-10-CM | POA: Insufficient documentation

## 2023-10-09 DIAGNOSIS — E785 Hyperlipidemia, unspecified: Secondary | ICD-10-CM

## 2023-10-09 DIAGNOSIS — G609 Hereditary and idiopathic neuropathy, unspecified: Secondary | ICD-10-CM

## 2023-10-09 LAB — C DIFFICILE QUICK SCREEN W PCR REFLEX
C Diff antigen: POSITIVE — AB
C Diff interpretation: DETECTED
C Diff toxin: POSITIVE — AB

## 2023-10-09 LAB — BLOOD CULTURE ID PANEL (REFLEXED) - BCID2

## 2023-10-09 LAB — CBC
HCT: 36.2 % (ref 36.0–46.0)
Hemoglobin: 11.9 g/dL — ABNORMAL LOW (ref 12.0–15.0)
MCH: 30.1 pg (ref 26.0–34.0)
MCHC: 32.9 g/dL (ref 30.0–36.0)
MCV: 91.4 fL (ref 80.0–100.0)
Platelets: 204 10*3/uL (ref 150–400)
RBC: 3.96 MIL/uL (ref 3.87–5.11)
RDW: 13.7 % (ref 11.5–15.5)
WBC: 12.6 10*3/uL — ABNORMAL HIGH (ref 4.0–10.5)
nRBC: 0 % (ref 0.0–0.2)

## 2023-10-09 LAB — PROTIME-INR
INR: 1 (ref 0.8–1.2)
Prothrombin Time: 13.9 s (ref 11.4–15.2)

## 2023-10-09 LAB — GLUCOSE, CAPILLARY
Glucose-Capillary: 173 mg/dL — ABNORMAL HIGH (ref 70–99)
Glucose-Capillary: 233 mg/dL — ABNORMAL HIGH (ref 70–99)
Glucose-Capillary: 240 mg/dL — ABNORMAL HIGH (ref 70–99)

## 2023-10-09 LAB — BASIC METABOLIC PANEL
Anion gap: 5 (ref 5–15)
BUN: 18 mg/dL (ref 8–23)
CO2: 27 mmol/L (ref 22–32)
Calcium: 8.8 mg/dL — ABNORMAL LOW (ref 8.9–10.3)
Chloride: 105 mmol/L (ref 98–111)
Creatinine, Ser: 0.66 mg/dL (ref 0.44–1.00)
GFR, Estimated: >60 mL/min (ref >60)
Glucose, Bld: 118 mg/dL — ABNORMAL HIGH (ref 70–99)
Potassium: 3.9 mmol/L (ref 3.5–5.1)
Sodium: 137 mmol/L (ref 135–145)

## 2023-10-09 LAB — CBG MONITORING, ED
Glucose-Capillary: 122 mg/dL — ABNORMAL HIGH (ref 70–99)
Glucose-Capillary: 137 mg/dL — ABNORMAL HIGH (ref 70–99)

## 2023-10-09 LAB — HEMOGLOBIN AND HEMATOCRIT, BLOOD
HCT: 36.8 % (ref 36.0–46.0)
HCT: 36.4 % (ref 36.0–46.0)
HCT: 33.8 % — ABNORMAL LOW (ref 36.0–46.0)
Hemoglobin: 12.2 g/dL (ref 12.0–15.0)
Hemoglobin: 11.9 g/dL — ABNORMAL LOW (ref 12.0–15.0)
Hemoglobin: 11.1 g/dL — ABNORMAL LOW (ref 12.0–15.0)

## 2023-10-09 LAB — HIV ANTIBODY (ROUTINE TESTING W REFLEX): HIV Screen 4th Generation wRfx: NONREACTIVE

## 2023-10-09 LAB — CORTISOL-AM, BLOOD: Cortisol - AM: 8.8 ug/dL (ref 6.7–22.6)

## 2023-10-09 LAB — HEMOGLOBIN A1C
Hgb A1c MFr Bld: 12.4 % — ABNORMAL HIGH (ref 4.8–5.6)
Mean Plasma Glucose: 309.18 mg/dL

## 2023-10-09 MED ORDER — VANCOMYCIN 50 MG/ML ORAL SOLUTION
125.0000 mg | Freq: Four times a day (QID) | ORAL | Status: DC
  Filled 2023-10-09 (×6): qty 2.5

## 2023-10-09 MED ORDER — INSULIN GLARGINE-YFGN 100 UNIT/ML ~~LOC~~ SOPN: 5.0000 [IU] | PEN_INJECTOR | SUBCUTANEOUS | Status: DC

## 2023-10-09 MED ORDER — VANCOMYCIN HCL 125 MG PO CAPS
125.0000 mg | ORAL_CAPSULE | Freq: Four times a day (QID) | ORAL | Status: DC
  Administered 2023-10-09 – 2023-10-14 (×22): 125 mg via ORAL
  Filled 2023-10-09 (×23): qty 1

## 2023-10-09 MED ORDER — INSULIN ASPART 100 UNIT/ML IJ SOLN
0.0000 [IU] | Freq: Every day | INTRAMUSCULAR | Status: DC
Start: 2023-10-09 — End: 2023-10-12
  Administered 2023-10-09 – 2023-10-10 (×2): 2 [IU] via SUBCUTANEOUS
  Administered 2023-10-11: 3 [IU] via SUBCUTANEOUS

## 2023-10-09 MED ORDER — METRONIDAZOLE 500 MG/100ML IV SOLN
500.0000 mg | Freq: Three times a day (TID) | INTRAVENOUS | Status: DC
  Administered 2023-10-09 – 2023-10-11 (×7): 500 mg via INTRAVENOUS
  Filled 2023-10-09 (×7): qty 100

## 2023-10-09 MED ORDER — INSULIN GLARGINE 100 UNIT/ML ~~LOC~~ SOLN
5.0000 [IU] | Freq: Every day | SUBCUTANEOUS | Status: DC
  Filled 2023-10-09: qty 0.05

## 2023-10-09 MED ORDER — INSULIN GLARGINE-YFGN 100 UNIT/ML ~~LOC~~ SOLN
15.0000 [IU] | Freq: Every day | SUBCUTANEOUS | Status: DC
  Administered 2023-10-09 – 2023-10-10 (×2): 15 [IU] via SUBCUTANEOUS
  Filled 2023-10-09 (×3): qty 0.15

## 2023-10-09 MED ORDER — SODIUM CHLORIDE 0.9 % IV SOLN
250.0000 mL | INTRAVENOUS | Status: AC
  Administered 2023-10-09: 250 mL via INTRAVENOUS

## 2023-10-09 MED ORDER — NOREPINEPHRINE 4 MG/250ML-% IV SOLN
2.0000 ug/min | INTRAVENOUS | Status: DC
Start: 2023-10-09 — End: 2023-10-09
  Filled 2023-10-09: qty 250

## 2023-10-09 MED ORDER — INSULIN GLARGINE-YFGN 100 UNIT/ML ~~LOC~~ SOLN
5.0000 [IU] | Freq: Every day | SUBCUTANEOUS | Status: DC
  Administered 2023-10-09: 5 [IU] via SUBCUTANEOUS
  Filled 2023-10-09 (×2): qty 0.05

## 2023-10-09 MED ORDER — INSULIN ASPART 100 UNIT/ML IJ SOLN
0.0000 [IU] | Freq: Three times a day (TID) | INTRAMUSCULAR | Status: DC
  Administered 2023-10-10: 3 [IU] via SUBCUTANEOUS
  Administered 2023-10-10: 5 [IU] via SUBCUTANEOUS
  Administered 2023-10-10: 3 [IU] via SUBCUTANEOUS
  Administered 2023-10-11 (×2): 5 [IU] via SUBCUTANEOUS
  Administered 2023-10-12: 3 [IU] via SUBCUTANEOUS

## 2023-10-09 MED ORDER — HYDROCORTISONE (PERIANAL) 2.5 % EX CREA
TOPICAL_CREAM | Freq: Two times a day (BID) | CUTANEOUS | Status: AC
  Administered 2023-10-12: 1 via RECTAL
  Filled 2023-10-09: qty 28.35

## 2023-10-09 MED ORDER — PANTOPRAZOLE SODIUM 40 MG IV SOLR
40.0000 mg | Freq: Two times a day (BID) | INTRAVENOUS | Status: DC
  Administered 2023-10-09 – 2023-10-14 (×11): 40 mg via INTRAVENOUS
  Filled 2023-10-09 (×11): qty 10

## 2023-10-09 NOTE — ED Notes (Signed)
 This RN noted red blood when changing the pts brief. A stool sample was obtained and a positive hemoccult was obtained. This RN made MD Mansy aware.

## 2023-10-09 NOTE — ED Notes (Signed)
 This RN was made aware by [facility] that we do not carry Vancomycin in the oral solution form but we have it in pill form. This RN contacted pharmacy to request the change to be made. Awaiting for the med to be changed.

## 2023-10-09 NOTE — Consult Note (Signed)
 Consulting  Provider: Dr. Arville Care Primary Care Physician:  Gilmore Laroche, FNP Primary Gastroenterologist:  Dr. Marletta Lor  Reason for Consultation: Rectal bleeding, C. difficile  HPI:  Amy Livingston is a 68 y.o. female with a past medical history of anxiety, atrial fibrillation, DM2, GERD, chronic diarrhea, hypertension, who presented to [facility], ER yesterday evening with syncopal episode while on the toilet.  Patient states she sat down on toilet and became dizzy, nauseous, suffered a syncopal episode that lasted anywhere from 10 to 20 seconds per reports.  In the ER labs revealed mild hyponatremia, hypokalemia, hyperglycemia, lactic acid 3.9 improved to 2.9.  CBC WNL.  Urinalysis positive for UTI.  Earlier this morning in the ER, RN noted red blood when changing the patient's brief.  Stool sample was obtained, positive Hemoccult.  Patient denies any recent melena or hematochezia.  She had a recent colonoscopy 07/25/2023 which showed moderate external hemorrhoids, diverticulosis in the sigmoid and transverse colon, 3 small polyps removed, otherwise unremarkable.  She states she had a bowel movement this morning and denied any rectal bleeding.  She states she was "wiped vigorously" by the EMS crew which caused significant rectal pain/discomfort and believes the small amount of blood was from he hemorrhoids.   Does have chronic diarrhea though states over the last few months this has overall improved with Metamucil.  She had a stool sample sent due to diarrhea which was positive for C. difficile antigen and toxin.  Does note completing course antibiotics within the last month for UTI.  Denies any abdominal pain. No previous C diff.   Past Medical History:  Diagnosis Date   Anxiety    Atrial fibrillation (HCC)    Diabetes mellitus without complication (HCC)    Dysrhythmia    GERD (gastroesophageal reflux disease)    Hypertension    PONV (postoperative nausea and vomiting)    Shortness of  breath dyspnea     Past Surgical History:  Procedure Laterality Date   ABDOMINAL HYSTERECTOMY     ANTERIOR CERVICAL DECOMP/DISCECTOMY FUSION N/A 01/13/2016   Procedure: C5-6 ANTERIOR CERVICAL DECOMPRESSION/DISKECTOMY/FUSION;  Surgeon: Barnett Abu, MD;  Location: [facility];  Service: Neurosurgery;  Laterality: N/A;  C5-6 ANTERIOR CERVICAL DECOMPRESSION/DISKECTOMY/FUSION   BACK SURGERY     x 5   BIOPSY  07/25/2023   Procedure: BIOPSY;  Surgeon: Lanelle Bal, DO;  Location: [facility];  Service: Endoscopy;;   CHOLECYSTECTOMY     COLONOSCOPY WITH PROPOFOL N/A 07/25/2023   Procedure: COLONOSCOPY WITH PROPOFOL;  Surgeon: Lanelle Bal, DO;  Location: [facility];  Service: Endoscopy;  Laterality: N/A;  10:45 am, asa 3   ESOPHAGEAL BRUSHING  07/25/2023   Procedure: ESOPHAGEAL BRUSHING;  Surgeon: Lanelle Bal, DO;  Location: [facility];  Service: Endoscopy;;   ESOPHAGOGASTRODUODENOSCOPY (EGD) WITH PROPOFOL N/A 07/25/2023   Procedure: ESOPHAGOGASTRODUODENOSCOPY (EGD) WITH PROPOFOL;  Surgeon: Lanelle Bal, DO;  Location: [facility];  Service: Endoscopy;  Laterality: N/A;   FOOT SURGERY     KNEE ARTHROSCOPY     left   KNEE SURGERY Right    POLYPECTOMY  07/25/2023   Procedure: POLYPECTOMY INTESTINAL;  Surgeon: Lanelle Bal, DO;  Location: [facility];  Service: Endoscopy;;   TUBAL LIGATION      Prior to Admission medications   Medication Sig Start Date End Date Taking? Authorizing Provider  atorvastatin (LIPITOR) 80 MG tablet Take 1 tablet (80 mg total) by mouth daily. 06/07/23   Gilmore Laroche, FNP  Blood Glucose Monitoring Suppl DEVI 1 each by Does not apply route in the morning, at noon, and at bedtime. May substitute to any manufacturer covered by patient's insurance. 06/07/23   Gilmore Laroche, FNP  busPIRone (BUSPAR) 10 MG tablet Take 1 tablet (10 mg total) by mouth 2 (two) times daily. 06/16/23   Gilmore Laroche, FNP  Cholecalciferol (VITAMIN D) 50  MCG (2000 UT) CAPS Take by mouth.    [provider]  Continuous Glucose Sensor (FREESTYLE LIBRE 3 SENSOR) MISC Place 1 sensor on the skin every 14 days. Use to check glucose continuously 06/26/23   Gilmore Laroche, FNP  ELIQUIS 5 MG TABS tablet Take 5 mg by mouth 2 (two) times daily.    [provider]  gabapentin (NEURONTIN) 300 MG capsule Take 1 capsule (300 mg total) by mouth 3 (three) times daily. 09/22/23   Gilmore Laroche, FNP  insulin aspart (NOVOLOG) 100 UNIT/ML injection ADMINISTER 20 UNITS UNDER THE SKIN THREE TIMES DAILY BEFORE MEALS 02/10/22   [provider]  insulin glargine (LANTUS) 100 UNIT/ML injection Inject 0.1 mLs (10 Units total) into the skin at bedtime. Patient not taking: Reported on 10/06/2023 09/08/23   Gilmore Laroche, FNP  insulin NPH-regular Human (70-30) 100 UNIT/ML injection Inject 40 Units into the skin 2 (two) times daily with a meal.    [provider]  Insulin Pen Needle (NOVOFINE PEN NEEDLE) 32G X 6 MM MISC USE TO INJECT INSULIN FOUR TIMES DAILY 02/15/22   [provider]  Insulin Syringe-Needle U-100 (BD INSULIN SYRINGE U/F) 31G X 5/16" 1 ML MISC USE FOR INSULIN 09/21/23   Gilmore Laroche, FNP  lisinopril (ZESTRIL) 5 MG tablet Take 1 tablet (5 mg total) by mouth daily. 06/07/23   Gilmore Laroche, FNP  meclizine (ANTIVERT) 25 MG tablet Take 25 mg by mouth daily as needed for dizziness.  Patient not taking: Reported on 10/06/2023    [provider]  metFORMIN (GLUCOPHAGE) 1000 MG tablet Take 1 tablet (1,000 mg total) by mouth 2 (two) times daily with a meal. 06/07/23   Gilmore Laroche, FNP  metoprolol tartrate (LOPRESSOR) 50 MG tablet Take 50 mg by mouth 2 (two) times daily. 01/21/20   [provider]  mirabegron ER (MYRBETRIQ) 25 MG TB24 tablet Take 1 tablet (25 mg total) by mouth daily. 06/16/23   Gilmore Laroche, FNP  nitrofurantoin, macrocrystal-monohydrate, (MACROBID) 100 MG capsule Take 1 capsule (100 mg  total) by mouth 2 (two) times daily. 08/17/23   Leath-Warren, Sadie Haber, NP  omeprazole (PRILOSEC) 40 MG capsule Take 1 capsule (40 mg total) by mouth daily. 09/08/23   Gilmore Laroche, FNP  oxyCODONE-acetaminophen (PERCOCET/ROXICET) 5-325 MG tablet Take 1-2 tablets by mouth every 4 (four) hours as needed for moderate pain. Patient taking differently: Take 1-2 tablets by mouth every 8 (eight) hours as needed for moderate pain (pain score 4-6). 01/14/16   Barnett Abu, MD  RESTASIS 0.05 % ophthalmic emulsion 1 drop 2 (two) times daily. 02/11/20   [provider]  sertraline (ZOLOFT) 50 MG tablet Take 1 tablet (50 mg total) by mouth daily. 06/16/23   Gilmore Laroche, FNP  tirzepatide [facility]) 5 MG/0.5ML Pen Inject 5 mg into the skin once a week. 09/28/23   Gilmore Laroche, FNP    Current Facility-Administered Medications  Medication Dose Route Frequency Provider Last Rate Last Admin   0.9 %  sodium chloride infusion  250 mL Intravenous Continuous Orson Aloe, [facility]       acetaminophen (TYLENOL) tablet 650  mg  650 mg Oral Q6H PRN Mansy, Jan A, MD       Or   acetaminophen (TYLENOL) suppository 650 mg  650 mg Rectal Q6H PRN Mansy, Jan A, MD       apixaban Everlene Balls) tablet 5 mg  5 mg Oral BID Mansy, Jan A, MD   5 mg at 10/09/23 0150   atorvastatin (LIPITOR) tablet 80 mg  80 mg Oral Daily Mansy, Jan A, MD       busPIRone (BUSPAR) tablet 10 mg  10 mg Oral BID Mansy, Jan A, MD   10 mg at 10/09/23 0149   cefTRIAXone (ROCEPHIN) 2 g in sodium chloride 0.9 % 100 mL IVPB  2 g Intravenous Q24H Mansy, Jan A, MD   Stopped at 10/09/23 0535   gabapentin (NEURONTIN) capsule 300 mg  300 mg Oral TID Mansy, Jan A, MD   300 mg at 10/09/23 0149   hydrocortisone (ANUSOL-HC) 2.5 % rectal cream   Rectal BID Lanelle Bal, DO       insulin glargine-yfgn [facility]) injection 5 Units  5 Units Subcutaneous QHS Mansy, Jan A, MD   5 Units at 10/09/23 1610   lactated ringers infusion   Intravenous Continuous Gloris Manchester, MD   Stopped at 10/09/23 0152   lactated ringers infusion  150 mL/hr Intravenous Continuous Mansy, Jan A, MD 150 mL/hr at 10/09/23 0431 150 mL/hr at 10/09/23 0431   lisinopril (ZESTRIL) tablet 5 mg  5 mg Oral Daily Mansy, Jan A, MD       magnesium hydroxide (MILK OF MAGNESIA) suspension 30 mL  30 mL Oral Daily PRN Mansy, Jan A, MD       meclizine (ANTIVERT) tablet 25 mg  25 mg Oral Daily PRN Mansy, Jan A, MD       metoprolol tartrate (LOPRESSOR) tablet 50 mg  50 mg Oral BID Mansy, Jan A, MD       metroNIDAZOLE (FLAGYL) IVPB 500 mg  500 mg Intravenous Q8H Mansy, Jan A, MD 100 mL/hr at 10/09/23 0616 500 mg at 10/09/23 0616   mirabegron ER (MYRBETRIQ) tablet 25 mg  25 mg Oral Daily Mansy, Jan A, MD       ondansetron [facility]) tablet 4 mg  4 mg Oral Q6H PRN Mansy, Jan A, MD       Or   ondansetron [facility]) injection 4 mg  4 mg Intravenous Q6H PRN Mansy, Jan A, MD       oxyCODONE-acetaminophen (PERCOCET/ROXICET) 5-325 MG per tablet 1-2 tablet  1-2 tablet Oral Q8H PRN Mansy, Jan A, MD       pantoprazole (PROTONIX) injection 40 mg  40 mg Intravenous Q12H Mansy, Jan A, MD       sertraline (ZOLOFT) tablet 50 mg  50 mg Oral Daily Mansy, Jan A, MD       traZODone (DESYREL) tablet 25 mg  25 mg Oral QHS PRN Mansy, Jan A, MD       vancomycin (VANCOCIN) capsule 125 mg  125 mg Oral QID Mansy, Jan A, MD        Allergies as of 10/08/2023 - Review Complete 10/08/2023  Allergen Reaction Noted   Elemental sulfur Itching and Rash 12/30/2015   Metformin and related Diarrhea 08/31/2023    History reviewed. No pertinent family history.  Social History   Socioeconomic History   Marital status: Divorced    Spouse name: Not on file   Number of children: Not on file   Years of education: Not  on file   Highest education level: Some college, no degree  Occupational History   Not on file  Tobacco Use   Smoking status: Never   Smokeless tobacco: Never  Substance and Sexual Activity   Alcohol use: No    Drug use: No   Sexual activity: Not on file  Other Topics Concern   Not on file  Social History Narrative   Not on file   Social Drivers of Health   Financial Resource Strain: Low Risk  (09/28/2023)   Overall Financial Resource Strain (CARDIA)    Difficulty of Paying Living Expenses: Not very hard  Food Insecurity: No Food Insecurity (09/28/2023)   Hunger Vital Sign    Worried About Running Out of Food in the Last Year: Never true    Ran Out of Food in the Last Year: Never true  Transportation Needs: No Transportation Needs (09/28/2023)   PRAPARE - Administrator, Civil Service (Medical): No    Lack of Transportation (Non-Medical): No  Physical Activity: Inactive (09/28/2023)   Exercise Vital Sign    Days of Exercise per Week: 0 days    Minutes of Exercise per Session: 0 min  Stress: Stress Concern Present (09/28/2023)   [employer] of Occupational Health - Occupational Stress Questionnaire    Feeling of Stress : Rather much  Social Connections: Moderately Isolated (09/28/2023)   Social Connection and Isolation Panel [NHANES]    Frequency of Communication with Friends and Family: More than three times a week    Frequency of Social Gatherings with Friends and Family: More than three times a week    Attends Religious Services: More than 4 times per year    Active Member of [employer] or Organizations: No    Attends Banker Meetings: Never    Marital Status: Divorced  Catering manager Violence: Not At Risk (09/28/2023)   Humiliation, Afraid, Rape, and Kick questionnaire    Fear of Current or Ex-Partner: No    Emotionally Abused: No    Physically Abused: No    Sexually Abused: No    Review of Systems: General: Negative for anorexia, weight loss, fever, chills, fatigue, weakness. Eyes: Negative for vision changes.  ENT: Negative for hoarseness, difficulty swallowing , nasal congestion. CV: Negative for chest pain, angina, palpitations, dyspnea on exertion,  peripheral edema.  Respiratory: Negative for dyspnea at rest, dyspnea on exertion, cough, sputum, wheezing.  GI: See history of present illness. GU:  Reports dysuria, no hematuria, urinary incontinence, urinary frequency, nocturnal urination.  MS: Negative for joint pain, low back pain.  Derm: Negative for rash or itching.  Neuro: Negative for weakness, abnormal sensation, seizure, frequent headaches, memory loss, confusion.  Psych: Negative for anxiety, depression Endo: Negative for unusual weight change.  Heme: Negative for bruising or bleeding. Allergy: Negative for rash or hives.  Physical Exam: Vital signs in last 24 hours: Temp:  [97.5 F (36.4 C)-98.2 F (36.8 C)] 98.2 F (36.8 C) (03/02 0809) Pulse Rate:  [90-118] 112 (03/02 0809) Resp:  [10-26] 18 (03/02 0809) BP: (75-140)/(49-97) 110/66 (03/02 0809) SpO2:  [93 %-100 %] 98 % (03/02 0809) Weight:  [95 kg-95.6 kg] 95.6 kg (03/02 0809) Last BM Date : 10/09/23 General:   Alert,  Well-developed, well-nourished, pleasant and cooperative in NAD Head:  Normocephalic and atraumatic. Eyes:  Sclera clear, no icterus.   Conjunctiva pink. Ears:  Normal auditory acuity. Nose:  No deformity, discharge,  or lesions. Mouth:  No deformity or lesions, dentition normal.  Neck:  Supple; no masses or thyromegaly. Lungs:  Clear throughout to auscultation.   No wheezes, crackles, or rhonchi. No acute distress. Heart:  Regular rate and rhythm; no murmurs, clicks, rubs,  or gallops. Abdomen:  Soft, nontender and nondistended. No masses, hepatosplenomegaly or hernias noted. Normal bowel sounds, without guarding, and without rebound.   Msk:  Symmetrical without gross deformities. Normal posture. Pulses:  Normal pulses noted. Extremities:  Without clubbing or edema. Neurologic:  Alert and  oriented x4;  grossly normal neurologically. Skin:  Intact without significant lesions or rashes. Cervical Nodes:  No significant cervical adenopathy. Psych:   Alert and cooperative. Normal mood and affect.  Intake/Output from previous day: 03/01 0701 - 03/02 0700 In: 1100 [IV Piggyback:1100] Out: -  Intake/Output this shift: Total I/O In: 300 [P.O.:300] Out: -   Lab Results: Recent Labs    10/08/23 1927 10/09/23 0350 10/09/23 0620 10/09/23 1013  WBC 10.1  --  12.6*  --   HGB 12.9 12.2 11.9* 11.9*  HCT 38.7 36.8 36.2 36.4  PLT 253  --  204  --    BMET Recent Labs    10/08/23 1927 10/09/23 0620  NA 132* 137  K 3.4* 3.9  CL 101 105  CO2 20* 27  GLUCOSE 436* 118*  BUN 23 18  CREATININE 0.93 0.66  CALCIUM 9.0 8.8*   LFT Recent Labs    10/08/23 1927  PROT 6.5  ALBUMIN 3.2*  AST 22  ALT 20  ALKPHOS 173*  BILITOT 0.6   PT/INR Recent Labs    10/08/23 1929 10/09/23 0620  LABPROT 13.4 13.9  INR 1.0 1.0   Hepatitis Panel No results for input(s): "HEPBSAG", "HCVAB", "HEPAIGM", "HEPBIGM" in the last 72 hours. C-Diff Recent Labs    10/09/23 0350  CDIFFTOX POSITIVE*    Studies/Results: DG Chest Port 1 View Result Date: 10/08/2023 CLINICAL DATA:  Questionable sepsis - evaluate for abnormality EXAM: PORTABLE CHEST 1 VIEW COMPARISON:  CT chest 10/08/2023 FINDINGS: The heart and mediastinal contours are within normal limits. Other sclerotic plaque. No focal consolidation. No pulmonary edema. No pleural effusion. No pneumothorax. No acute osseous abnormality. IMPRESSION: 1. No active disease. 2.  Aortic Atherosclerosis (ICD10-I70.0). Electronically Signed   By: Tish Frederickson M.D.   On: 10/08/2023 21:26   CT Head Wo Contrast Result Date: 10/08/2023 CLINICAL DATA:  Mental status change, unknown cause EXAM: CT HEAD WITHOUT CONTRAST TECHNIQUE: Contiguous axial images were obtained from the base of the skull through the vertex without intravenous contrast. RADIATION DOSE REDUCTION: This exam was performed according to the departmental dose-optimization program which includes automated exposure control, adjustment of the mA  and/or kV according to patient size and/or use of iterative reconstruction technique. COMPARISON:  CT head 06/11/2013 report without imaging. FINDINGS: Brain: No evidence of large-territorial acute infarction. No parenchymal hemorrhage. No mass lesion. No extra-axial collection. No mass effect or midline shift. No hydrocephalus. Basilar cisterns are patent. Vascular: No hyperdense vessel. Atherosclerotic calcifications are present within the cavernous internal carotid arteries. Skull: No acute fracture or focal lesion. Sinuses/Orbits: Paranasal sinuses and mastoid air cells are clear. The orbits are unremarkable. Other: None. IMPRESSION: No acute intracranial abnormality. Electronically Signed   By: Tish Frederickson M.D.   On: 10/08/2023 21:26   CT CHEST ABDOMEN PELVIS WO CONTRAST Result Date: 10/08/2023 CLINICAL DATA:  Sepsis.  Prior UTIs EXAM: CT CHEST, ABDOMEN AND PELVIS WITHOUT CONTRAST TECHNIQUE: Multidetector CT imaging of the chest, abdomen and pelvis was performed following the standard  protocol without IV contrast. RADIATION DOSE REDUCTION: This exam was performed according to the departmental dose-optimization program which includes automated exposure control, adjustment of the mA and/or kV according to patient size and/or use of iterative reconstruction technique. COMPARISON:  None Available. FINDINGS: CT CHEST FINDINGS Cardiovascular: Normal heart size. No significant pericardial effusion. The thoracic aorta is normal in caliber. Mild atherosclerotic plaque of the thoracic aorta. At least 3 vessel coronary artery calcifications. Mediastinum/Nodes: No enlarged mediastinal, hilar, or axillary lymph nodes. Thyroid gland, trachea, and esophagus demonstrate no significant findings. Tiny hiatal hernia for in Lungs/Pleura: Diffuse mild bronchial wall thickening. No focal consolidation. No pulmonary nodule. No pulmonary mass. No pleural effusion. No pneumothorax. Musculoskeletal: No chest wall abnormality. No  suspicious lytic or blastic osseous lesions. No acute displaced fracture. Cervical spine surgical hardware. CT ABDOMEN PELVIS FINDINGS Hepatobiliary: No focal liver abnormality. Status post cholecystectomy. No biliary dilatation. Pancreas: No focal lesion. Slight haziness of the proximal pancreatic contour with trace fat stranding. No surrounding inflammatory changes. No main pancreatic ductal dilatation. Spleen: Normal in size without focal abnormality. Adrenals/Urinary Tract: No adrenal nodule bilaterally. Mild left hydroureteronephrosis. No right hydroureteronephrosis. No nephroureterolithiasis bilaterally. Urinary bladder wall circumferential urinary bladder wall thickening and mild perivesicular fat stranding. Stomach/Bowel: Stomach is within normal limits. No evidence of bowel wall thickening or dilatation. Colonic diverticulosis. Appendix appears normal. Vascular/Lymphatic: No abdominal aorta or iliac aneurysm. Severe atherosclerotic plaque of the aorta and its branches. No abdominal, pelvic, or inguinal lymphadenopathy. Reproductive: Status post hysterectomy. No adnexal masses. Other: No intraperitoneal free fluid. No intraperitoneal free gas. No organized fluid collection. Musculoskeletal: No abdominal wall hernia or abnormality. No suspicious lytic or blastic osseous lesions. No acute displaced fracture. Diffusely decreased bone density. L3-L4 posterolateral surgical hardware fusion. Multilevel degenerative changes spine. IMPRESSION: 1. No acute intrathoracic abnormality. 2. Cystitis. 3. Mild left hydroureteronephrosis. This may reflect the residual of a recently passed calculus or reflect chronic changes of obstructive uropathy or reflux. 4. Question mild acute pancreatitis.  Correlate with lipase levels. 5. Colonic diverticulosis with no acute diverticulitis. 6. Limited evaluation on this noncontrast study. Electronically Signed   By: Tish Frederickson M.D.   On: 10/08/2023 21:24     Assessment/Plan:  1.  Rectal bleeding-1 episode of red blood in brief per ER RN.  Reports she had a bowel movement this morning which contained no blood.  Recent colonoscopy with diverticulosis and moderate external hemorrhoids.  Likely her bleeding benign anorectal source.  Would continue to monitor for now.  Hemoglobin stable, dropped 1 g though likely dilutional.  Will add Anusol cream to use twice daily.  Continue to monitor H&H.  Do not believe she needs endoscopic evaluation at this juncture..  If she has further bleeding or significant drop in hemoglobin then please let us know and we can reconsider possibility of inpatient endoscopic evaluation.  2.  C. difficile infection-agree with p.o. vancomycin 4 times daily x 10 days.  If patient is going to be on extended antibiotic course for UTI then would recommend continue vancomycin throughout length of antibiotic course and for 3 days therafter (ex. If on abx for UTI x14 days, would extend PO vancomycin course to 17 days instead of 10)  Thank you for the consultation.  GI to follow peripherally.  Hennie Duos. Marletta Lor, D.O. Gastroenterology and Hepatology [facility]    LOS: 1 day     10/09/2023, 10:50 AM

## 2023-10-09 NOTE — Plan of Care (Signed)

## 2023-10-09 NOTE — Assessment & Plan Note (Signed)
-   We will continue BuSpar and Zoloft. 

## 2023-10-09 NOTE — Assessment & Plan Note (Signed)
 -  We will continue Eliquis.

## 2023-10-09 NOTE — Progress Notes (Signed)
 Progress Note   Patient: Amy Livingston ZOX:[id number] DOB: February 02, 1956 DOA: 10/08/2023     1 DOS: the patient was seen and examined on 10/09/2023   Brief hospital admission narrative: As per H&P written by Dr. Arville Care on 10/08/23 Shanise Balch is a 68 y.o. Caucasian female with medical history significant for anxiety, atrial fibrillation, type diabetes mellitus, dysrhythmia, GERD, and essential hypertension, who presented to the emergency room with acute onset of generalized weakness tonight and an episode of syncope when she was on the toilet this evening required increased assistance from her daughter to even stand up.  Her daughter witnessed to have her having a syncopal episode at the time that lasted 10 to 20 seconds.  There was no head injury or other injuries.  She had mild twitching during the episode.  She had a quick return of mental status baseline afterwards.  She denied any history of seizures.  She was noted to be mildly hypoxic on arrival to the hospital.  She was complaining of chronic back pain.  No paresthesias or focal muscle weakness.  No chest pain or palpitations.  No cough or wheezing or dyspnea.  Per her daughter she has been having visual hesitations intermittently since January.  She is also been having episodes of syncope when she stands up from sitting or lying down position.   ED Course: When the she came to the ER, temperature was 97.5 and heart rate was 118 with otherwise normal vital signs.  Labs revealed a pH of 7.34 and HCO3 of 27.4.  CMP revealed mild hyponatremia and hypokalemia, hyperglycemia of 436 and CO2 of 21 with alk phos 173 and albumin 3.2.  Lipase was 35 and ammonia level 16.  High sensitive troponin I was 3 twice and BNP 39.  Lactic acid was 3.9 and later 2.9.  CBC was within normal.  Respiratory panel came back negative.  UA was positive for UTI.  Urine drug screen was negative.  Alcohol level was less than 10.  Blood culture was drawn as well as urine culture. EKG  as reviewed by me :  EKG showed sinus tachycardia with a rate of 117 with borderline prolonged QT interval with QTc of 494 mL. Imaging: None Acid CT scan revealed no acute intracranial abnormalities.  Portable chest x-ray showed aortic atherosclerosis with no acute cardiopulmonary disease.   The patient was given IV cefepime, Flagyl and 2.5 L of IV lactated ringer bolus followed by 150 mL/h as well as 1 p.o. fluconazole.  She was placed on IV Levophed briefly that was later tapered off with improved blood pressure.  The patient will be admitted to a medical telemetry bed for further evaluation and management.  Assessment and Plan: * Sepsis due to UTI (HCC) - Cultures pending at the moment -Continue supportive care, adequate fluid resuscitation and IV antibiotics. -Patient reports ongoing abdominal discomfort and diarrhea for multiple weeks prior to admission.  Some of her episodes led to accidents which most likely has be responsible for causing UTI. -As needed analgesics and antipyretics will be provided.  C.Diff  infection/bright red blood per rectum -Continue oral vancomycin -Maintain adequate hydration -Case discussed with GI service and planning to provide supportive care -Continue Anusol -Holding on endoscopic evaluation at the moment -Continue to follow hemoglobin trend and transfuse as needed -Bleeding episode most likely driven by internal hemorrhoids or diverticulosis.  Uncontrolled type 2 diabetes mellitus with hyperglycemia, with long-term current use of insulin (HCC) - Continue sliding scale insulin and follow CBG  fluctuation -Holding oral hypoglycemic agents while inpatient.  Hypokalemia - Magnesium within normal limits -Continue to follow electrolytes trend and further replete as needed.  Syncope and collapse - With concerns for sepsis physiology and hypotension -Holding antihypertensive agents except for metoprolol -Continue to maintain adequate hydration -Continue  telemetry monitoring -Continue treatment with antibiotics as mentioned above for sepsis -Continue supportive care and follow clinical response.  Hyponatremia - In the setting of GI losses and dehydration -Continue fluid resuscitation and follow electrolytes trend.  Anxiety and depression - No suicidal ideation hallucination -Continue BuSpar and Zoloft  Paroxysmal atrial fibrillation (HCC) - Continue metoprolol -Continue telemetry monitoring -Holding Eliquis acutely in the setting of GI bleed -SCDs in place.  Peripheral neuropathy - Continue treatment with Neurontin.  Hypothyroidism/dyslipidemia -Continue Synthroid.   -Continue statin.   Subjective:  Afebrile, no chest pain, no nausea or vomiting.  Reports feeling better.  Still with ongoing loose stools and has noticed a small amount of positive bright red blood in her stools.  Physical Exam: Vitals:   10/09/23 0730 10/09/23 0731 10/09/23 0809 10/09/23 1428  BP: (!) 112/58  110/66 91/65  Pulse: (!) 113  (!) 112 (!) 108  Resp: 18  18   Temp:  98.1 F (36.7 C) 98.2 F (36.8 C) 98.3 F (36.8 C)  TempSrc:  Oral    SpO2: 100%  98% 99%  Weight:   95.6 kg   Height:   5\' 6"  (1.676 m)    General exam: Alert, awake, oriented x 3; reports feeling better, no nausea vomiting.  Still experiencing loose stools and has noticed bright red blood in her stools. Respiratory system: Good saturation on room air. Cardiovascular system: Sinus tachycardia, no rubs, no gallops, no JVD. Gastrointestinal system: Abdomen is obese, nondistended, soft and nontender. No organomegaly or masses felt. Normal bowel sounds heard. Central nervous system: No focal neurological deficits. Extremities: No cyanosis or clubbing. Skin: No petechiae. Psychiatry: Judgement and insight appear normal. Mood & affect appropriate.    Data Reviewed: C. Diff studies: Antigen and toxin positive Fecal occult blood test: Positive. Basic metabolic panel: Sodium 137,  potassium 3.9, chloride 105, bicarb 27, CBG 118, BUN 18, creatinine 0.66 and GFR >60 CBC: WBCs 12.6, hemoglobin 11.9 and platelet count 204K Cortisol level: 8.8     Family Communication: Brother at bedside.  Disposition: Status is: Inpatient Remains inpatient appropriate because: Continue IV antibiotics.   Planned Discharge Destination: Home  Time spent: 50 minutes  Author: Vassie Loll, MD 10/09/2023 4:27 PM  For on call review [email or web address].

## 2023-10-09 NOTE — Assessment & Plan Note (Signed)
-   This is highly suspicious for orthostatic hypotension - Will check orthostatics q 12 hours. - The patient will be hydrated with IV lactated ringer as mentioned above. -Differential diagnoses would additionally include neurally mediated syncope, cardiogenic, arrhythmias related, and less likely hypoglycemia.

## 2023-10-09 NOTE — ED Notes (Signed)
 This RN and MD Mansy tried several times to give the pt long acting insulin due to the formulary options we were not able to get the medication order. This RN contacted pharmacy to please change the medication from Lantus to [facility]. MD Mansy endorses that if we are not able to get the [facility] ordered tonight then he is ok with the pt Blood sugar being at 122 to hold the medication until the morning.

## 2023-10-09 NOTE — Progress Notes (Signed)
   10/09/23 1727  TOC Brief Assessment  Patient has primary care physician Yes  Home environment has been reviewed From home  Prior level of function: [place]  Prior/Current Home Services No current home services  Social Drivers of Health Review SDOH reviewed no interventions necessary  Readmission risk has been reviewed Yes  Transition of care needs no transition of care needs at this time     Transition of Care Department [facility]) has reviewed patient and no TOC needs have been identified at this time. We will continue to monitor patient advancement through interdisciplinary progression rounds. If new patient transition needs arise, please place a TOC consult.

## 2023-10-09 NOTE — Plan of Care (Signed)
  Problem: Fluid Volume: Goal: Hemodynamic stability will improve Outcome: Progressing   Problem: Clinical Measurements: Goal: Diagnostic test results will improve Outcome: Progressing   Problem: Education: Goal: Knowledge of General Education information will improve Description: Including pain rating scale, medication(s)/side effects and non-pharmacologic comfort measures Outcome: Progressing

## 2023-10-09 NOTE — Assessment & Plan Note (Addendum)
 Marland Kitchen

## 2023-10-10 ENCOUNTER — Telehealth: Payer: Self-pay | Admitting: Gastroenterology

## 2023-10-10 ENCOUNTER — Other Ambulatory Visit (HOSPITAL_COMMUNITY): Payer: Self-pay

## 2023-10-10 ENCOUNTER — Telehealth (HOSPITAL_COMMUNITY): Payer: Self-pay | Admitting: Pharmacy Technician

## 2023-10-10 DIAGNOSIS — E876 Hypokalemia: Secondary | ICD-10-CM | POA: Diagnosis not present

## 2023-10-10 DIAGNOSIS — A0472 Enterocolitis due to Clostridium difficile, not specified as recurrent: Secondary | ICD-10-CM | POA: Diagnosis not present

## 2023-10-10 DIAGNOSIS — K625 Hemorrhage of anus and rectum: Secondary | ICD-10-CM | POA: Diagnosis not present

## 2023-10-10 DIAGNOSIS — I480 Paroxysmal atrial fibrillation: Secondary | ICD-10-CM | POA: Diagnosis not present

## 2023-10-10 DIAGNOSIS — A4150 Gram-negative sepsis, unspecified: Secondary | ICD-10-CM | POA: Diagnosis not present

## 2023-10-10 LAB — BASIC METABOLIC PANEL
Anion gap: 8 (ref 5–15)
BUN: 13 mg/dL (ref 8–23)
CO2: 25 mmol/L (ref 22–32)
Calcium: 8.5 mg/dL — ABNORMAL LOW (ref 8.9–10.3)
Chloride: 106 mmol/L (ref 98–111)
Creatinine, Ser: 0.55 mg/dL (ref 0.44–1.00)
GFR, Estimated: >60 mL/min (ref >60)
Glucose, Bld: 220 mg/dL — ABNORMAL HIGH (ref 70–99)
Potassium: 3.4 mmol/L — ABNORMAL LOW (ref 3.5–5.1)
Sodium: 139 mmol/L (ref 135–145)

## 2023-10-10 LAB — GLUCOSE, CAPILLARY
Glucose-Capillary: 222 mg/dL — ABNORMAL HIGH (ref 70–99)
Glucose-Capillary: 277 mg/dL — ABNORMAL HIGH (ref 70–99)
Glucose-Capillary: 219 mg/dL — ABNORMAL HIGH (ref 70–99)
Glucose-Capillary: 212 mg/dL — ABNORMAL HIGH (ref 70–99)

## 2023-10-10 LAB — URINE CULTURE: Culture: NO GROWTH

## 2023-10-10 LAB — CBC
HCT: 32.9 % — ABNORMAL LOW (ref 36.0–46.0)
Hemoglobin: 10.8 g/dL — ABNORMAL LOW (ref 12.0–15.0)
MCH: 30.3 pg (ref 26.0–34.0)
MCHC: 32.8 g/dL (ref 30.0–36.0)
MCV: 92.4 fL (ref 80.0–100.0)
Platelets: 173 10*3/uL (ref 150–400)
RBC: 3.56 MIL/uL — ABNORMAL LOW (ref 3.87–5.11)
RDW: 14 % (ref 11.5–15.5)
WBC: 6.6 10*3/uL (ref 4.0–10.5)
nRBC: 0 % (ref 0.0–0.2)

## 2023-10-10 MED ORDER — VANCOMYCIN HCL IN DEXTROSE 1-5 GM/200ML-% IV SOLN
1000.0000 mg | Freq: Two times a day (BID) | INTRAVENOUS | Status: DC
  Filled 2023-10-10 (×2): qty 200

## 2023-10-10 MED ORDER — POTASSIUM CHLORIDE CRYS ER 20 MEQ PO TBCR
40.0000 meq | EXTENDED_RELEASE_TABLET | Freq: Once | ORAL | Status: AC
  Administered 2023-10-10: 40 meq via ORAL
  Filled 2023-10-10: qty 2

## 2023-10-10 MED ORDER — SODIUM CHLORIDE 0.9 % IV SOLN: INTRAVENOUS | Status: AC | PRN

## 2023-10-10 MED ORDER — CALCIUM CARBONATE ANTACID 500 MG PO CHEW
1.0000 | CHEWABLE_TABLET | Freq: Three times a day (TID) | ORAL | Status: DC
  Administered 2023-10-10 – 2023-10-14 (×11): 200 mg via ORAL
  Filled 2023-10-10 (×12): qty 1

## 2023-10-10 MED ORDER — VANCOMYCIN HCL 2000 MG/400ML IV SOLN
2000.0000 mg | Freq: Once | INTRAVENOUS | Status: AC
  Administered 2023-10-10: 2000 mg via INTRAVENOUS
  Filled 2023-10-10 (×2): qty 400

## 2023-10-10 NOTE — Progress Notes (Signed)
 Progress Note   Patient: Amy Livingston [id number] DOB: Jul 30, 1956 DOA: 10/08/2023     2 DOS: the patient was seen and examined on 10/10/2023   Brief hospital admission narrative: As per H&P written by Dr. Arville Care on 10/08/23 Amy Livingston is a 68 y.o. Caucasian female with medical history significant for anxiety, atrial fibrillation, type diabetes mellitus, dysrhythmia, GERD, and essential hypertension, who presented to the emergency room with acute onset of generalized weakness tonight and an episode of syncope when she was on the toilet this evening required increased assistance from her daughter to even stand up.  Her daughter witnessed to have her having a syncopal episode at the time that lasted 10 to 20 seconds.  There was no head injury or other injuries.  She had mild twitching during the episode.  She had a quick return of mental status baseline afterwards.  She denied any history of seizures.  She was noted to be mildly hypoxic on arrival to the hospital.  She was complaining of chronic back pain.  No paresthesias or focal muscle weakness.  No chest pain or palpitations.  No cough or wheezing or dyspnea.  Per her daughter she has been having visual hesitations intermittently since January.  She is also been having episodes of syncope when she stands up from sitting or lying down position.   ED Course: When the she came to the ER, temperature was 97.5 and heart rate was 118 with otherwise normal vital signs.  Labs revealed a pH of 7.34 and HCO3 of 27.4.  CMP revealed mild hyponatremia and hypokalemia, hyperglycemia of 436 and CO2 of 21 with alk phos 173 and albumin 3.2.  Lipase was 35 and ammonia level 16.  High sensitive troponin I was 3 twice and BNP 39.  Lactic acid was 3.9 and later 2.9.  CBC was within normal.  Respiratory panel came back negative.  UA was positive for UTI.  Urine drug screen was negative.  Alcohol level was less than 10.  Blood culture was drawn as well as urine culture. EKG  as reviewed by me :  EKG showed sinus tachycardia with a rate of 117 with borderline prolonged QT interval with QTc of 494 mL. Imaging: None Acid CT scan revealed no acute intracranial abnormalities.  Portable chest x-ray showed aortic atherosclerosis with no acute cardiopulmonary disease.   The patient was given IV cefepime, Flagyl and 2.5 L of IV lactated ringer bolus followed by 150 mL/h as well as 1 p.o. fluconazole.  She was placed on IV Levophed briefly that was later tapered off with improved blood pressure.  The patient will be admitted to a medical telemetry bed for further evaluation and management.  Assessment and Plan: * Sepsis due to UTI (HCC) - Cultures pending at the moment -Continue supportive care, adequate fluid resuscitation and IV antibiotics. -Patient reports ongoing abdominal discomfort and diarrhea for multiple weeks prior to admission.  Some of her episodes led to accidents which most likely has been responsible for causing UTI. -As needed analgesics and antipyretics will be provided.  C.Diff  infection/bright red blood per rectum -Continue oral vancomycin -Maintain adequate hydration -Case discussed with GI service and planning to provide supportive care -Continue Anusol -Continue holding on endoscopic evaluation at the moment -Continue to follow hemoglobin trend and transfuse as needed -Bleeding episode most likely driven by internal hemorrhoids or diverticulosis. -Hemoglobin currently trending up and no significant bleeding appreciated.  Uncontrolled type 2 diabetes mellitus with hyperglycemia, with long-term current use of  insulin (HCC) - Continue sliding scale insulin and follow CBG fluctuation -Holding oral hypoglycemic agents while inpatient.  Hypokalemia - Magnesium within normal limits -Continue to follow electrolytes trend and further replete as needed.  Syncope and collapse - With concerns for sepsis physiology and hypotension -Holding  antihypertensive agents except for metoprolol -Continue to maintain adequate hydration -Continue telemetry monitoring -Continue treatment with antibiotics as mentioned above for sepsis -Continue supportive care and follow clinical response.  Hyponatremia - In the setting of GI losses and dehydration -Continue fluid resuscitation and follow electrolytes trend.  Anxiety and depression - No suicidal ideation hallucination -Continue BuSpar and Zoloft  Paroxysmal atrial fibrillation (HCC) - Continue metoprolol -Continue telemetry monitoring -Holding Eliquis acutely in the setting of GI bleed -SCDs in place.  Peripheral neuropathy - Continue treatment with Neurontin.  Hypothyroidism/dyslipidemia -Continue Synthroid.   -Continue statin.  Staph bacteremia -Case discussed with ID Merceda Elks, MD) -Empirically started on IV vancomycin -Repeat blood cultures   Subjective:  No fever, no chest pain, no nausea or vomiting.  Reports overall feeling better and noticing decrease episodes of loose stools.  No other bleeding reported.  Physical Exam: Vitals:   10/09/23 1830 10/09/23 2055 10/10/23 0440 10/10/23 1320  BP: 134/86 129/69 116/64 (!) 144/76  Pulse: 93 89 93 95  Resp: 18 18  18   Temp: 98.1 F (36.7 C) 97.8 F (36.6 C) 97.6 F (36.4 C) 98 F (36.7 C)  TempSrc: Oral Oral Oral Oral  SpO2: 100% 100% 97% 100%  Weight:      Height:       General exam: Alert, awake, oriented x 3; no chest pain, no nausea, no vomiting, no shortness of breath. Respiratory system: Good saturation on room air. Cardiovascular system:RRR. No rubs or gallops. Gastrointestinal system: Abdomen is nondistended, soft and nontender.  Positive bowel sounds appreciated. Central nervous system: Alert and oriented. No focal neurological deficits. Extremities: No cyanosis or clubbing. Skin: No petechiae. Psychiatry: Judgement and insight appear normal. Mood & affect appropriate.    Data Reviewed: C. Diff  studies: Antigen and toxin positive Fecal occult blood test: Positive. Basic metabolic panel: Sodium 137, potassium 3.9, chloride 105, bicarb 27, CBG 118, BUN 18, creatinine 0.66 and GFR >60 CBC: WBCs 12.6, hemoglobin 11.9 and platelet count 204K Cortisol level: 8.8 Blood cultures: Staph Lug 1/2 isolated   Family Communication: Brother at bedside.  Disposition: Status is: Inpatient Remains inpatient appropriate because: Continue IV antibiotics.   Planned Discharge Destination: Home  Time spent: 50 minutes  Author: Vassie Loll, MD 10/10/2023 5:27 PM  For on call review [email or web address].

## 2023-10-10 NOTE — Progress Notes (Signed)
 No bms today but reports nausea.  Has eaten about 50% of meals today.  No vomiting.

## 2023-10-10 NOTE — Telephone Encounter (Signed)
 Patient Product/process development scientist completed.    The patient is insured through [employer]. Patient has Medicare and is not eligible for a copay card, but may be able to apply for patient assistance or Medicare RX Payment Plan (Patient Must reach out to their plan, if eligible for payment plan), if available.    Ran test claim for Vancomycin 125 mg capsules and the current 10 day co-pay is $1.60.   This test claim was processed through [facility]- copay amounts may vary at other pharmacies due to pharmacy/plan contracts, or as the patient moves through the different stages of their insurance plan.     Roland Earl, CPHT Pharmacy Technician III Certified Patient Advocate [facility] Patient Advocate Team Direct Number: [phone]  Fax: [phone]

## 2023-10-10 NOTE — Inpatient Diabetes Management (Signed)
 Inpatient Diabetes Program Recommendations  AACE/ADA: New Consensus Statement on Inpatient Glycemic Control (2015)  Target Ranges:  Prepandial:   less than 140 mg/dL      Peak postprandial:   less than 180 mg/dL (1-2 hours)      Critically ill patients:  140 - 180 mg/dL   Lab Results  Component Value Date   GLUCAP 277 (H) 10/10/2023   HGBA1C 12.4 (H) 10/09/2023    Latest Reference Range & Units 10/08/23 19:20 10/09/23 01:48 10/09/23 03:38 10/09/23 11:33 10/09/23 16:45 10/09/23 20:55 10/10/23 07:31 10/10/23 11:10  Glucose-Capillary 70 - 99 mg/dL 161 (H) 096 (H) 045 (H) 173 (H) 233 (H) 240 (H) Novolog 2 units 222 (H) Novolog 3 units 277 (H) Novolog 5 units  (H): Data is abnormally high  Latest Reference Range & Units 06/01/23 16:12 10/09/23 15:58  Hemoglobin A1C 4.8 - 5.6 % >15.5 (H) 12.4 (H)  (H): Data is abnormally high  Diabetes history: DM2 Outpatient Diabetes medications:  Novolin 70/30 insulin 40 units bid (equals approximately 56 units basal + 24 units meal coverage, Metformin 1 gm bid,  Mounjaro 5 mg weekly Current orders for Inpatient glycemic control: Lantus 15 units daily, Novolog 0-9 units tid, 0-5 units hs correction  Inpatient Diabetes Program Recommendations:   Noted A1c decreased from >15.5 on 06/01/23 to currently 12.4 with adjustment in insulin doses. Please consider: -Increase Lantus to 25 units daily -Add Novolog 4 units tid meal coverage if eats 50% -Add carb mod to current diet order if appropriate  Thank you, Darel Hong E. Lyndsie Wallman, RN, MSN, CDCES  Diabetes Coordinator Inpatient Glycemic Control Team Team Pager [phone] (8am-5pm) 10/10/2023 12:50 PM

## 2023-10-10 NOTE — Progress Notes (Signed)
 Pharmacy Antibiotic Note  Amy Livingston is a 68 y.o. female w/ PMH of anxiety, atrial fibrillation, type diabetes mellitus, dysrhythmia, GERD, and essential hypertension, admitted on 10/08/2023 with UTI.  Pharmacy has been consulted for vancomycin dosing for bacteremia.  Plan: start vancomycin 2000 mg IV x 1 then 1000 mg IV every 12 hours  Goal AUC 400-550. Expected AUC: 506 SCr used: 0.80 mg/dL (rounded up)   Height: 5\' 6"  (167.6 cm) Weight: 95.6 kg (210 lb 12.2 oz) IBW/kg (Calculated) : 59.3  Temp (24hrs), Avg:97.9 F (36.6 C), Min:97.6 F (36.4 C), Max:98.1 F (36.7 C)  Recent Labs  Lab 10/08/23 1927 10/08/23 1929 10/08/23 2115 10/09/23 0620 10/10/23 0707  WBC 10.1  --   --  12.6* 6.6  CREATININE 0.93  --   --  0.66 0.55  LATICACIDVEN  --  3.9* 2.9*  --   --     Estimated Creatinine Clearance: 79.5 mL/min (by C-G formula based on SCr of 0.55 mg/dL).    Allergies  Allergen Reactions   Elemental Sulfur Itching and Rash    Yeast infections   Metformin And Related Diarrhea    GI distress    Antimicrobials this admission: 03/01 metronidazole >>  03/03 ceftriaxone >> 03/02 vancomycin po >> 03/03 vancomycin >>   Microbiology results: 03/01 BCx: S lugdunensis 03/01 UCx: NG final 03/02 C diff positive antigen, toxin  Thank you for allowing pharmacy to be a part of this patient's care.  Lowella Bandy 10/10/2023 6:04 PM

## 2023-10-10 NOTE — Progress Notes (Signed)
 Gastroenterology Progress Note    Patient ID: Amy Livingston; [id number]; 09/18/1955    Subjective   States small clumps of BMs. No further rectal bleeding. Feels much better. No BM today. No N/V, feels much improved. In good spirits.    Objective   Vital signs in last 24 hours Temp:  [97.6 F (36.4 C)-98.3 F (36.8 C)] 97.6 F (36.4 C) (03/03 0440) Pulse Rate:  [89-108] 93 (03/03 0440) Resp:  [18] 18 (03/02 2055) BP: (91-134)/(64-86) 116/64 (03/03 0440) SpO2:  [97 %-100 %] 97 % (03/03 0440) Last BM Date : 10/09/23  Physical Exam General:   Alert and oriented, pleasant Head:  Normocephalic and atraumatic. Eyes:  No icterus, sclera clear. Conjuctiva pink.  Abdomen:  Bowel sounds present, soft, non-tender, non-distended. No HSM or hernias noted. No rebound or guarding. No masses appreciated  Neurologic:  Alert and  oriented x4   Intake/Output from previous day: 03/02 0701 - 03/03 0700 In: 828 [P.O.:600; I.V.:128; IV Piggyback:100] Out: -  Intake/Output this shift: No intake/output data recorded.  Lab Results  Recent Labs    10/08/23 1927 10/09/23 0350 10/09/23 0620 10/09/23 1013 10/09/23 1558 10/10/23 0707  WBC 10.1  --  12.6*  --   --  6.6  HGB 12.9   < > 11.9* 11.9* 11.1* 10.8*  HCT 38.7   < > 36.2 36.4 33.8* 32.9*  PLT 253  --  204  --   --  173   < > = values in this interval not displayed.   BMET Recent Labs    10/08/23 1927 10/09/23 0620 10/10/23 0707  NA 132* 137 139  K 3.4* 3.9 3.4*  CL 101 105 106  CO2 20* 27 25  GLUCOSE 436* 118* 220*  BUN 23 18 13   CREATININE 0.93 0.66 0.55  CALCIUM 9.0 8.8* 8.5*   LFT Recent Labs    10/08/23 1927  PROT 6.5  ALBUMIN 3.2*  AST 22  ALT 20  ALKPHOS 173*  BILITOT 0.6   PT/INR Recent Labs    10/08/23 1929 10/09/23 0620  LABPROT 13.4 13.9  INR 1.0 1.0     Studies/Results DG Chest Port 1 View Result Date: 10/08/2023 CLINICAL DATA:  Questionable sepsis - evaluate for abnormality EXAM:  PORTABLE CHEST 1 VIEW COMPARISON:  CT chest 10/08/2023 FINDINGS: The heart and mediastinal contours are within normal limits. Other sclerotic plaque. No focal consolidation. No pulmonary edema. No pleural effusion. No pneumothorax. No acute osseous abnormality. IMPRESSION: 1. No active disease. 2.  Aortic Atherosclerosis (ICD10-I70.0). Electronically Signed   By: Tish Frederickson M.D.   On: 10/08/2023 21:26   CT Head Wo Contrast Result Date: 10/08/2023 CLINICAL DATA:  Mental status change, unknown cause EXAM: CT HEAD WITHOUT CONTRAST TECHNIQUE: Contiguous axial images were obtained from the base of the skull through the vertex without intravenous contrast. RADIATION DOSE REDUCTION: This exam was performed according to the departmental dose-optimization program which includes automated exposure control, adjustment of the mA and/or kV according to patient size and/or use of iterative reconstruction technique. COMPARISON:  CT head 06/11/2013 report without imaging. FINDINGS: Brain: No evidence of large-territorial acute infarction. No parenchymal hemorrhage. No mass lesion. No extra-axial collection. No mass effect or midline shift. No hydrocephalus. Basilar cisterns are patent. Vascular: No hyperdense vessel. Atherosclerotic calcifications are present within the cavernous internal carotid arteries. Skull: No acute fracture or focal lesion. Sinuses/Orbits: Paranasal sinuses and mastoid air cells are clear. The orbits are unremarkable. Other: None. IMPRESSION: No acute intracranial  abnormality. Electronically Signed   By: Tish Frederickson M.D.   On: 10/08/2023 21:26   CT CHEST ABDOMEN PELVIS WO CONTRAST Result Date: 10/08/2023 CLINICAL DATA:  Sepsis.  Prior UTIs EXAM: CT CHEST, ABDOMEN AND PELVIS WITHOUT CONTRAST TECHNIQUE: Multidetector CT imaging of the chest, abdomen and pelvis was performed following the standard protocol without IV contrast. RADIATION DOSE REDUCTION: This exam was performed according to the  departmental dose-optimization program which includes automated exposure control, adjustment of the mA and/or kV according to patient size and/or use of iterative reconstruction technique. COMPARISON:  None Available. FINDINGS: CT CHEST FINDINGS Cardiovascular: Normal heart size. No significant pericardial effusion. The thoracic aorta is normal in caliber. Mild atherosclerotic plaque of the thoracic aorta. At least 3 vessel coronary artery calcifications. Mediastinum/Nodes: No enlarged mediastinal, hilar, or axillary lymph nodes. Thyroid gland, trachea, and esophagus demonstrate no significant findings. Tiny hiatal hernia for in Lungs/Pleura: Diffuse mild bronchial wall thickening. No focal consolidation. No pulmonary nodule. No pulmonary mass. No pleural effusion. No pneumothorax. Musculoskeletal: No chest wall abnormality. No suspicious lytic or blastic osseous lesions. No acute displaced fracture. Cervical spine surgical hardware. CT ABDOMEN PELVIS FINDINGS Hepatobiliary: No focal liver abnormality. Status post cholecystectomy. No biliary dilatation. Pancreas: No focal lesion. Slight haziness of the proximal pancreatic contour with trace fat stranding. No surrounding inflammatory changes. No main pancreatic ductal dilatation. Spleen: Normal in size without focal abnormality. Adrenals/Urinary Tract: No adrenal nodule bilaterally. Mild left hydroureteronephrosis. No right hydroureteronephrosis. No nephroureterolithiasis bilaterally. Urinary bladder wall circumferential urinary bladder wall thickening and mild perivesicular fat stranding. Stomach/Bowel: Stomach is within normal limits. No evidence of bowel wall thickening or dilatation. Colonic diverticulosis. Appendix appears normal. Vascular/Lymphatic: No abdominal aorta or iliac aneurysm. Severe atherosclerotic plaque of the aorta and its branches. No abdominal, pelvic, or inguinal lymphadenopathy. Reproductive: Status post hysterectomy. No adnexal masses. Other:  No intraperitoneal free fluid. No intraperitoneal free gas. No organized fluid collection. Musculoskeletal: No abdominal wall hernia or abnormality. No suspicious lytic or blastic osseous lesions. No acute displaced fracture. Diffusely decreased bone density. L3-L4 posterolateral surgical hardware fusion. Multilevel degenerative changes spine. IMPRESSION: 1. No acute intrathoracic abnormality. 2. Cystitis. 3. Mild left hydroureteronephrosis. This may reflect the residual of a recently passed calculus or reflect chronic changes of obstructive uropathy or reflux. 4. Question mild acute pancreatitis.  Correlate with lipase levels. 5. Colonic diverticulosis with no acute diverticulitis. 6. Limited evaluation on this noncontrast study. Electronically Signed   By: Tish Frederickson M.D.   On: 10/08/2023 21:24    Assessment  68 y.o. female with a history of anxiety, atrial fibrillation, DM2, GERD, chronic diarrhea, hypertension, admitted with sepsis due to UTI, found to have Cdiff and episode of rectal bleeding  Rectal bleeding: known hemorrhoids with recent colonoscopy Dec 2024. Rectal bleeding now resolved. Hgb 10.8, initially 11.9 on admission. Suspect multifactorial and dilutional component as well. No need for endoscopic evaluation. Continue with Anusol supportively.   Cdiff: positive toxin and antigen. Clinically improved. Stool consistency improved. Continue with course of vanc QID for total of 10 days; however, if patient is on extended abx for UTI, needs to continue on vanc throughout that treatment and extend an additional 3 days after last abx for UTI has been completed.   Plan / Recommendations  Course of vanc X 10 days with extension as noted if needed Anusol BID Will sign off and arrange outpatient follow-up.     LOS: 2 days    10/10/2023, 8:58 AM  Gelene Mink,  PhD, ANP-BC [facility]

## 2023-10-10 NOTE — Progress Notes (Incomplete)
 Staph lug

## 2023-10-10 NOTE — Telephone Encounter (Signed)
 Amy Livingston: please make hospital follow-up with me. Thanks!

## 2023-10-11 DIAGNOSIS — K625 Hemorrhage of anus and rectum: Secondary | ICD-10-CM | POA: Diagnosis not present

## 2023-10-11 DIAGNOSIS — A4150 Gram-negative sepsis, unspecified: Secondary | ICD-10-CM | POA: Diagnosis not present

## 2023-10-11 DIAGNOSIS — I480 Paroxysmal atrial fibrillation: Secondary | ICD-10-CM | POA: Diagnosis not present

## 2023-10-11 DIAGNOSIS — E876 Hypokalemia: Secondary | ICD-10-CM | POA: Diagnosis not present

## 2023-10-11 LAB — CULTURE, BLOOD (ROUTINE X 2)

## 2023-10-11 LAB — GLUCOSE, CAPILLARY
Glucose-Capillary: 262 mg/dL — ABNORMAL HIGH (ref 70–99)
Glucose-Capillary: 190 mg/dL — ABNORMAL HIGH (ref 70–99)
Glucose-Capillary: 286 mg/dL — ABNORMAL HIGH (ref 70–99)
Glucose-Capillary: 299 mg/dL — ABNORMAL HIGH (ref 70–99)

## 2023-10-11 MED ORDER — CEFAZOLIN SODIUM-DEXTROSE 2-4 GM/100ML-% IV SOLN
2.0000 g | Freq: Three times a day (TID) | INTRAVENOUS | Status: DC
  Administered 2023-10-11 – 2023-10-14 (×10): 2 g via INTRAVENOUS
  Filled 2023-10-11 (×10): qty 100

## 2023-10-11 MED ORDER — INSULIN GLARGINE-YFGN 100 UNIT/ML ~~LOC~~ SOLN
20.0000 [IU] | Freq: Every day | SUBCUTANEOUS | Status: DC
  Administered 2023-10-11 – 2023-10-13 (×3): 20 [IU] via SUBCUTANEOUS
  Filled 2023-10-11: qty 10
  Filled 2023-10-11 (×4): qty 0.2

## 2023-10-11 MED ORDER — POTASSIUM CHLORIDE CRYS ER 20 MEQ PO TBCR
40.0000 meq | EXTENDED_RELEASE_TABLET | Freq: Once | ORAL | Status: AC
  Administered 2023-10-11: 40 meq via ORAL
  Filled 2023-10-11: qty 2

## 2023-10-11 NOTE — Inpatient Diabetes Management (Signed)
 Inpatient Diabetes Program Recommendations  AACE/ADA: New Consensus Statement on Inpatient Glycemic Control (2015)  Target Ranges:  Prepandial:   less than 140 mg/dL      Peak postprandial:   less than 180 mg/dL (1-2 hours)      Critically ill patients:  140 - 180 mg/dL   Lab Results  Component Value Date   GLUCAP 262 (H) 10/11/2023   HGBA1C 12.4 (H) 10/09/2023    Latest Reference Range & Units 10/10/23 07:31 10/10/23 11:10 10/10/23 16:15 10/10/23 20:47 10/11/23 07:47  Glucose-Capillary 70 - 99 mg/dL 161 (H) Novolog 3 units 277 (H) Novolog 5 units 219 (H) Novolog 3 units 212 (H) Novolog 2 units 262 (H) Novolog 5 units  (H): Data is abnormally high  Diabetes history: DM2 Outpatient Diabetes medications:  Novolin 70/30 insulin 40 units bid (equals approximately 56 units basal + 24 units meal coverage, Metformin 1 gm bid,  Mounjaro 5 mg weekly Current orders for Inpatient glycemic control: Lantus 15 units daily, Novolog 0-9 units tid, 0-5 units hs correction  Inpatient Diabetes Program Recommendations:   Noted patient received total of Novolog 15 units Please consider: -Increase Lantus to 20 units daily -Add Novolog 2-3 units tid meal coverage if eats 50% -Add carb mod to current diet order if appropriate  Thank you, Darel Hong E. Darienne Belleau, RN, MSN, CDCES  Diabetes Coordinator Inpatient Glycemic Control Team Team Pager [phone] (8am-5pm) 10/11/2023 9:57 AM

## 2023-10-11 NOTE — Progress Notes (Signed)
 Progress Note   Patient: Amy Livingston BMW:[id number] DOB: 16-Sep-1955 DOA: 10/08/2023     3 DOS: the patient was seen and examined on 10/11/2023   Brief hospital admission narrative: As per H&P written by Dr. Arville Care on 10/08/23 Mekaylah Klich is a 68 y.o. Caucasian female with medical history significant for anxiety, atrial fibrillation, type diabetes mellitus, dysrhythmia, GERD, and essential hypertension, who presented to the emergency room with acute onset of generalized weakness tonight and an episode of syncope when she was on the toilet this evening required increased assistance from her daughter to even stand up.  Her daughter witnessed to have her having a syncopal episode at the time that lasted 10 to 20 seconds.  There was no head injury or other injuries.  She had mild twitching during the episode.  She had a quick return of mental status baseline afterwards.  She denied any history of seizures.  She was noted to be mildly hypoxic on arrival to the hospital.  She was complaining of chronic back pain.  No paresthesias or focal muscle weakness.  No chest pain or palpitations.  No cough or wheezing or dyspnea.  Per her daughter she has been having visual hesitations intermittently since January.  She is also been having episodes of syncope when she stands up from sitting or lying down position.   ED Course: When the she came to the ER, temperature was 97.5 and heart rate was 118 with otherwise normal vital signs.  Labs revealed a pH of 7.34 and HCO3 of 27.4.  CMP revealed mild hyponatremia and hypokalemia, hyperglycemia of 436 and CO2 of 21 with alk phos 173 and albumin 3.2.  Lipase was 35 and ammonia level 16.  High sensitive troponin I was 3 twice and BNP 39.  Lactic acid was 3.9 and later 2.9.  CBC was within normal.  Respiratory panel came back negative.  UA was positive for UTI.  Urine drug screen was negative.  Alcohol level was less than 10.  Blood culture was drawn as well as urine culture. EKG  as reviewed by me :  EKG showed sinus tachycardia with a rate of 117 with borderline prolonged QT interval with QTc of 494 mL. Imaging: None Acid CT scan revealed no acute intracranial abnormalities.  Portable chest x-ray showed aortic atherosclerosis with no acute cardiopulmonary disease.   The patient was given IV cefepime, Flagyl and 2.5 L of IV lactated ringer bolus followed by 150 mL/h as well as 1 p.o. fluconazole.  She was placed on IV Levophed briefly that was later tapered off with improved blood pressure.  The patient will be admitted to a medical telemetry bed for further evaluation and management.  Assessment and Plan: * Sepsis due to UTI (HCC) - Cultures pending at the moment -Continue supportive care and maintain adequate hydration. -Patient reports ongoing abdominal discomfort and diarrhea for multiple weeks prior to admission.  Some of her episodes led to accidents which most likely has been responsible for causing UTI. -As needed analgesics and antipyretics will be provided. -At this moment patient reports no further dysuria; urinalysis without any growth and the patient has completed 3 days of IV antibiotics. -Stopping further antibiotics for UTI.  C.Diff  infection/bright red blood per rectum -Continue oral vancomycin -Maintain adequate hydration -Case discussed with GI service and planning to provide supportive care -Continue Anusol -Continue holding on endoscopic evaluation at the moment -Continue to follow hemoglobin trend and transfuse as needed -Bleeding episode most likely driven by internal  hemorrhoids or diverticulosis. -Hemoglobin currently trending up and no significant bleeding appreciated.  Uncontrolled type 2 diabetes mellitus with hyperglycemia, with long-term current use of insulin (HCC) - Continue sliding scale insulin and follow CBG fluctuation -Continue holding oral hypoglycemic agents while inpatient. -Patient Semglee dose has been adjusted to 20  units  Hypokalemia - Magnesium within normal limits -Continue to follow electrolytes trend and further replete as needed.  Syncope and collapse - With concerns for sepsis physiology and hypotension -Holding antihypertensive agents except for metoprolol -Continue to maintain adequate hydration -Continue telemetry monitoring -Continue treatment with antibiotics as mentioned above for sepsis -Continue supportive care and follow clinical response.  Hyponatremia - In the setting of GI losses and dehydration -Continue fluid resuscitation and follow electrolytes trend.  Anxiety and depression - No suicidal ideation hallucination -Continue BuSpar and Zoloft  Paroxysmal atrial fibrillation (HCC) -Continue metoprolol -Continue telemetry monitoring -Holding Eliquis acutely in the setting of GI bleed; per GI service safe to resume after holding for 2 more days. -SCDs in place.  Peripheral neuropathy - Continue treatment with Neurontin.  Hypothyroidism/dyslipidemia -Continue Synthroid.   -Continue statin.  Staph bacteremia -Case discussed with ID Merceda Elks, MD and Dr. Elinor Parkinson) -Empirically started on IV vancomycin on 10/10/2023 and subsequently transition to cefazolin following further recommendations. -Repeat blood cultures pending. -At this moment in the absence of any further dysuria and completing 3 days of IV antibiotics safe to stop Rocephin.   Subjective:  No fever, no chest pain, no nausea vomiting.  Patient expressed no dysuria and is currently reporting some nausea.  Overnight express experiencing a fall while trying to use the bathroom without assistance but no injuring herself.  Physical Exam: Vitals:   10/10/23 1320 10/10/23 2042 10/11/23 0453 10/11/23 1316  BP: (!) 144/76 (!) 148/77 (!) 137/94 105/72  Pulse: 95 88 92 87  Resp: 18 18 20 19   Temp: 98 F (36.7 C) 98.6 F (37 C) (!) 97.4 F (36.3 C) 97.8 F (36.6 C)  TempSrc: Oral Oral Oral   SpO2: 100% 100%  100% 100%  Weight:      Height:       General exam: Alert, awake, oriented x 3; no chest pain, no shortness of breath, no nausea vomiting.  Reports fall at night while trying use the bathroom without injuring herself. Respiratory system: Clear to auscultation.  Good saturation on room air.  No using accessory muscles. Cardiovascular system: Rate controlled, no rubs, no gallops, no JVD. Gastrointestinal system: Abdomen is nondistended, soft and nontender. No organomegaly or masses felt. Normal bowel sounds heard. Central nervous system: Alert and oriented. No focal neurological deficits. Extremities: No cyanosis or clubbing. Skin: No petechiae. Psychiatry: Judgement and insight appear normal. Mood & affect appropriate.   Latest data Reviewed: C. Diff studies: Antigen and toxin positive Fecal occult blood test: Positive. Basic metabolic panel: Sodium 137, potassium 3.9, chloride 105, bicarb 27, CBG 118, BUN 18, creatinine 0.66 and GFR >60 CBC: WBCs 12.6, hemoglobin 11.9 and platelet count 204K Blood cultures: Staph Lug 1/2 isolated; repeat blood cultures still pending. CBG's in 220 range   Family Communication: No family at bedside.  Disposition: Status is: Inpatient Remains inpatient appropriate because: Continue IV antibiotics.   Planned Discharge Destination: Home  Time spent: 50 minutes  Author: Vassie Loll, MD 10/11/2023 4:42 PM  For on call review [email or web address].

## 2023-10-11 NOTE — Progress Notes (Signed)
 Writer called to room by bathroom call bell. Pt found sitting on bathroom floor on buttocks. States that she felt dizzy while standing up from toilet and fell. Denies hitting head, no apparent injury, c/o of right knee soreness, . VS : 97.4, 137/94,2, 92, 100 % on RA.Dr. Antionette Char notified. New orders placed.

## 2023-10-11 NOTE — Consult Note (Addendum)
 [facility]                                                                                       Patient Identification: Patient Name: Amy Livingston MRN: [id number] Admit Date: 10/08/2023  7:14 PM Today's Date: 10/11/2023 Reason for consult: bacteremia  Requesting provider: Dr Mikle Bosworth   Principal Problem:   Sepsis due to gram-negative UTI [facility]) Active Problems:   Dyslipidemia   Hypokalemia   Hyponatremia   Uncontrolled type 2 diabetes mellitus with hyperglycemia, with long-term current use of insulin (HCC)   Essential hypertension   Peripheral neuropathy   Paroxysmal atrial fibrillation (HCC)   Anxiety and depression   Syncope and collapse   BRBPR (bright red blood per rectum)   Enteritis due to Clostridium difficile   Diarrhea of infectious origin   Antibiotics:  Vancomycin 3/3-c Cefepime 3/1, ceftriaxone 3/1-c Metronidazole 3/1-c PO Vancomycin 3/2-    Lines/Hardware:  Assessment # Staph lugdunensis bacteremia  - unclear source - Per chart review , c spine surgical hardware as well as lumbar surgical hardware fusion +, no concerns reported  - chronic back pain reported in ED  # C diff diarrhea  - appears to be first episode  - does not appear to be severe c diff colitis  # Rectal bleeding - evaluated by GI, thought to be related to hemorrhoid, hb stable   Recommendations  - DC vancomycin, start cefazolin - Fu repeat blood cx - TTE ordered  - Stop ceftriaxone and metronidazole, unlikely UTI  - Continue vancomycin 125 mg po qid for 10 days. Will need to consider prophylaxis thereafter if continued need for abtx - Monitor CBC and CMP on abtx - Monitor for metastatic sites of infection, any worsening of back pain for need to image - d/w primary team   Rest of the management as per the primary team. Please call with questions or concerns.  Thank you for the  consult  __________________________________________________________________________________________________________ HPI and Hospital Course: 68 Y O Female with prior h/o C5-C6 decompression/fusion, A fib, DM, anxiety, HTN, GERD who presented to ED on 3/1 with acute onset generalized weakness and witnessed  syncope while she was on the toilet. She also had a mild twitching during the episode. She also had episodes of syncope while standing from sitting position.  EMS noted mild hypoxia, reported chronic back pain on arrival to ED  At ED afebrile, ill appearing, tachycardic CBC unremarkable, CMP with Na 132, k 3.4, BG 436, ALP 173, LA 3.9 RSV, Flu A, B and SARScov2 negative Urine cx NG, UA with small leukocytes, bacteria, budding yeast, negative nitrite, glucose, protein and hb +  Patient had multiple Bms in the ED  with drop in BP. Started on IVF, levophed and empiric abtx cefepime and metronidazole as well as PO fluconazole. Levophed was later weaned off shortly after improvement of BP  Imaging as below  ROS: unavailable, remote consult  Past Medical History:  Diagnosis Date   Anxiety    Atrial fibrillation (HCC)    Diabetes mellitus without complication (HCC)    Dysrhythmia    GERD (gastroesophageal reflux disease)  Hypertension    PONV (postoperative nausea and vomiting)    Shortness of breath dyspnea    Past Surgical History:  Procedure Laterality Date   ABDOMINAL HYSTERECTOMY     ANTERIOR CERVICAL DECOMP/DISCECTOMY FUSION N/A 01/13/2016   Procedure: C5-6 ANTERIOR CERVICAL DECOMPRESSION/DISKECTOMY/FUSION;  Surgeon: Barnett Abu, MD;  Location: [facility];  Service: Neurosurgery;  Laterality: N/A;  C5-6 ANTERIOR CERVICAL DECOMPRESSION/DISKECTOMY/FUSION   BACK SURGERY     x 5   BIOPSY  07/25/2023   Procedure: BIOPSY;  Surgeon: Lanelle Bal, DO;  Location: [facility];  Service: Endoscopy;;   CHOLECYSTECTOMY     COLONOSCOPY WITH PROPOFOL N/A 07/25/2023   Procedure:  COLONOSCOPY WITH PROPOFOL;  Surgeon: Lanelle Bal, DO;  Location: [facility];  Service: Endoscopy;  Laterality: N/A;  10:45 am, asa 3   ESOPHAGEAL BRUSHING  07/25/2023   Procedure: ESOPHAGEAL BRUSHING;  Surgeon: Lanelle Bal, DO;  Location: [facility];  Service: Endoscopy;;   ESOPHAGOGASTRODUODENOSCOPY (EGD) WITH PROPOFOL N/A 07/25/2023   Procedure: ESOPHAGOGASTRODUODENOSCOPY (EGD) WITH PROPOFOL;  Surgeon: Lanelle Bal, DO;  Location: [facility];  Service: Endoscopy;  Laterality: N/A;   FOOT SURGERY     KNEE ARTHROSCOPY     left   KNEE SURGERY Right    POLYPECTOMY  07/25/2023   Procedure: POLYPECTOMY INTESTINAL;  Surgeon: Lanelle Bal, DO;  Location: [facility];  Service: Endoscopy;;   TUBAL LIGATION     Scheduled Meds:  atorvastatin  80 mg Oral Daily   busPIRone  10 mg Oral BID   calcium carbonate  1 tablet Oral TID   gabapentin  300 mg Oral TID   hydrocortisone   Rectal BID   insulin aspart  0-5 Units Subcutaneous QHS   insulin aspart  0-9 Units Subcutaneous TID WC   insulin glargine-yfgn  15 Units Subcutaneous QHS   metoprolol tartrate  50 mg Oral BID   mirabegron ER  25 mg Oral Daily   pantoprazole (PROTONIX) IV  40 mg Intravenous Q12H   sertraline  50 mg Oral Daily   vancomycin  125 mg Oral QID   Continuous Infusions:  sodium chloride 125 mL/hr at 10/11/23 0305   cefTRIAXone (ROCEPHIN)  IV 2 g (10/11/23 0559)   metronidazole Stopped (10/11/23 0242)   vancomycin     PRN Meds:.sodium chloride, acetaminophen **OR** acetaminophen, meclizine, ondansetron **OR** ondansetron (ZOFRAN) IV, oxyCODONE-acetaminophen, traZODone  Allergies  Allergen Reactions   Elemental Sulfur Itching and Rash    Yeast infections   Metformin And Related Diarrhea    GI distress   Social History   Socioeconomic History   Marital status: Divorced    Spouse name: Not on file   Number of children: Not on file   Years of education: Not on file   Highest education  level: Some college, no degree  Occupational History   Not on file  Tobacco Use   Smoking status: Never   Smokeless tobacco: Never  Substance and Sexual Activity   Alcohol use: No   Drug use: No   Sexual activity: Not on file  Other Topics Concern   Not on file  Social History Narrative   Not on file   Social Drivers of Health   Financial Resource Strain: Low Risk  (09/28/2023)   Overall Financial Resource Strain (CARDIA)    Difficulty of Paying Living Expenses: Not very hard  Food Insecurity: No Food Insecurity (10/09/2023)   Hunger Vital Sign    Worried About  Running Out of Food in the Last Year: Never true    Ran Out of Food in the Last Year: Never true  Transportation Needs: No Transportation Needs (10/09/2023)   PRAPARE - Administrator, Civil Service (Medical): No    Lack of Transportation (Non-Medical): No  Physical Activity: Inactive (09/28/2023)   Exercise Vital Sign    Days of Exercise per Week: 0 days    Minutes of Exercise per Session: 0 min  Stress: Stress Concern Present (09/28/2023)   [employer] of Occupational Health - Occupational Stress Questionnaire    Feeling of Stress : Rather much  Social Connections: Unknown (10/09/2023)   Social Connection and Isolation Panel [NHANES]    Frequency of Communication with Friends and Family: Twice a week    Frequency of Social Gatherings with Friends and Family: More than three times a week    Attends Religious Services: Patient declined    Database administrator or Organizations: Patient declined    Attends Banker Meetings: Patient declined    Marital Status: Patient declined  Recent Concern: Social Connections - Moderately Isolated (09/28/2023)   Social Connection and Isolation Panel [NHANES]    Frequency of Communication with Friends and Family: More than three times a week    Frequency of Social Gatherings with Friends and Family: More than three times a week    Attends Religious Services:  More than 4 times per year    Active Member of [employer] or Organizations: No    Attends Banker Meetings: Never    Marital Status: Divorced  Catering manager Violence: Not At Risk (10/09/2023)   Humiliation, Afraid, Rape, and Kick questionnaire    Fear of Current or Ex-Partner: No    Emotionally Abused: No    Physically Abused: No    Sexually Abused: No   History reviewed. No pertinent family history.  Vitals BP (!) 137/94 (BP Location: Right Wrist)   Pulse 92   Temp (!) 97.4 F (36.3 C) (Oral)   Resp 20   Ht 5\' 6"  (1.676 m)   Wt 95.6 kg   SpO2 100%   BMI 34.02 kg/m    Physical Exam Remote consult   Pertinent Microbiology Results for orders placed or performed during the hospital encounter of 10/08/23  Resp panel by RT-PCR (RSV, Flu A&B, Covid) Anterior Nasal Swab     Status: None   Collection Time: 10/08/23  7:29 PM   Specimen: Anterior Nasal Swab  Result Value Ref Range Status   SARS Coronavirus 2 by RT PCR NEGATIVE NEGATIVE Final    Comment: (NOTE) SARS-CoV-2 target nucleic acids are NOT DETECTED.  The SARS-CoV-2 RNA is generally detectable in upper respiratory specimens during the acute phase of infection. The lowest concentration of SARS-CoV-2 viral copies this assay can detect is 138 copies/mL. A negative result does not preclude SARS-Cov-2 infection and should not be used as the sole basis for treatment or other patient management decisions. A negative result may occur with  improper specimen collection/handling, submission of specimen other than nasopharyngeal swab, presence of viral mutation(s) within the areas targeted by this assay, and inadequate number of viral copies(<138 copies/mL). A negative result must be combined with clinical observations, patient history, and epidemiological information. The expected result is Negative.  Fact Sheet for Patients:  BloggerCourse.com  Fact Sheet for Healthcare Providers:   SeriousBroker.it  This test is no t yet approved or cleared by the [place] FDA and  has been  authorized for detection and/or diagnosis of SARS-CoV-2 by FDA under an Emergency Use Authorization (EUA). This EUA will remain  in effect (meaning this test can be used) for the duration of the COVID-19 declaration under Section 564(b)(1) of the Act, 21 U.S.C.section 360bbb-3(b)(1), unless the authorization is terminated  or revoked sooner.       Influenza A by PCR NEGATIVE NEGATIVE Final   Influenza B by PCR NEGATIVE NEGATIVE Final    Comment: (NOTE) The Xpert Xpress SARS-CoV-2/FLU/RSV plus assay is intended as an aid in the diagnosis of influenza from Nasopharyngeal swab specimens and should not be used as a sole basis for treatment. Nasal washings and aspirates are unacceptable for Xpert Xpress SARS-CoV-2/FLU/RSV testing.  Fact Sheet for Patients: BloggerCourse.com  Fact Sheet for Healthcare Providers: SeriousBroker.it  This test is not yet approved or cleared by the [place] FDA and has been authorized for detection and/or diagnosis of SARS-CoV-2 by FDA under an Emergency Use Authorization (EUA). This EUA will remain in effect (meaning this test can be used) for the duration of the COVID-19 declaration under Section 564(b)(1) of the Act, 21 U.S.C. section 360bbb-3(b)(1), unless the authorization is terminated or revoked.     Resp Syncytial Virus by PCR NEGATIVE NEGATIVE Final    Comment: (NOTE) Fact Sheet for Patients: BloggerCourse.com  Fact Sheet for Healthcare Providers: SeriousBroker.it  This test is not yet approved or cleared by the [place] FDA and has been authorized for detection and/or diagnosis of SARS-CoV-2 by FDA under an Emergency Use Authorization (EUA). This EUA will remain in effect (meaning this test can be used) for  the duration of the COVID-19 declaration under Section 564(b)(1) of the Act, 21 U.S.C. section 360bbb-3(b)(1), unless the authorization is terminated or revoked.  Performed at [facility], [address], [place], [place] [address]   Blood Culture (routine x 2)     Status: Abnormal (Preliminary result)   Collection Time: 10/08/23  7:29 PM   Specimen: BLOOD  Result Value Ref Range Status   Specimen Description   Final    BLOOD Performed at [facility], [address] [place], [place] [address]    Special Requests   Final    NONE Performed at [facility], [address], [place], [place] [address]    Culture  Setup Time   Final    GRAM POSITIVE COCCI IN BOTH AEROBIC AND ANAEROBIC BOTTLES CRITICAL RESULT CALLED TO, READ BACK BY AND VERIFIED WITH: KIM B. ON 10/09/2023 @16 :26 BY T.HAMER GRAM STAIN REVIEWED-AGREE WITH RESULT Organism ID to follow CRITICAL RESULT CALLED TO, READ BACK BY AND VERIFIED WITH: C GANOE,RN@2239  10/09/23 MK    Culture (A)  Final    STAPHYLOCOCCUS LUGDUNENSIS SUSCEPTIBILITIES TO FOLLOW Performed at [facility], [address] [place], [place] [address]    Report Status PENDING  Incomplete  Urine Culture     Status: None   Collection Time: 10/08/23  7:29 PM   Specimen: Urine, Random  Result Value Ref Range Status   Specimen Description   Final    URINE, RANDOM Performed at [facility], [address], [place], [place] [address]    Special Requests   Final    NONE Reflexed from [id number] Performed at [facility], [address], [place], [place] [address]    Culture   Final    NO GROWTH Performed at [facility], [address], [place], [place] [address]    Report Status 10/10/2023 FINAL  Final  Blood Culture ID Panel (Reflexed)     Status: Abnormal   Collection Time: 10/08/23  7:29 PM  Result Value Ref Range Status   Enterococcus faecalis NOT DETECTED NOT DETECTED Final   Enterococcus Faecium NOT DETECTED NOT DETECTED Final   Listeria  monocytogenes NOT DETECTED NOT DETECTED Final   Staphylococcus species DETECTED (A) NOT DETECTED Final    Comment: CRITICAL RESULT CALLED TO, READ BACK BY AND VERIFIED WITH: C GANOE,RN@2239  10/09/23 MK    Staphylococcus aureus (BCID) NOT DETECTED NOT DETECTED Final   Staphylococcus epidermidis NOT DETECTED NOT DETECTED Final   Staphylococcus lugdunensis DETECTED (A) NOT DETECTED Final    Comment: CRITICAL RESULT CALLED TO, READ BACK BY AND VERIFIED WITH: C GANOE,RN@2239  10/09/23 MK    Streptococcus species NOT DETECTED NOT DETECTED Final   Streptococcus agalactiae NOT DETECTED NOT DETECTED Final   Streptococcus pneumoniae NOT DETECTED NOT DETECTED Final   Streptococcus pyogenes NOT DETECTED NOT DETECTED Final   A.calcoaceticus-baumannii NOT DETECTED NOT DETECTED Final   Bacteroides fragilis NOT DETECTED NOT DETECTED Final   Enterobacterales NOT DETECTED NOT DETECTED Final   Enterobacter cloacae complex NOT DETECTED NOT DETECTED Final   Escherichia coli NOT DETECTED NOT DETECTED Final   Klebsiella aerogenes NOT DETECTED NOT DETECTED Final   Klebsiella oxytoca NOT DETECTED NOT DETECTED Final   Klebsiella pneumoniae NOT DETECTED NOT DETECTED Final   Proteus species NOT DETECTED NOT DETECTED Final   Salmonella species NOT DETECTED NOT DETECTED Final   Serratia marcescens NOT DETECTED NOT DETECTED Final   Haemophilus influenzae NOT DETECTED NOT DETECTED Final   Neisseria meningitidis NOT DETECTED NOT DETECTED Final   Pseudomonas aeruginosa NOT DETECTED NOT DETECTED Final   Stenotrophomonas maltophilia NOT DETECTED NOT DETECTED Final   Candida albicans NOT DETECTED NOT DETECTED Final   Candida auris NOT DETECTED NOT DETECTED Final   Candida glabrata NOT DETECTED NOT DETECTED Final   Candida krusei NOT DETECTED NOT DETECTED Final   Candida parapsilosis NOT DETECTED NOT DETECTED Final   Candida tropicalis NOT DETECTED NOT DETECTED Final   Cryptococcus neoformans/gattii NOT DETECTED NOT  DETECTED Final   Methicillin resistance mecA/C NOT DETECTED NOT DETECTED Final    Comment: Performed at [facility], [address] [place], [place] [address]  Blood Culture (routine x 2)     Status: None (Preliminary result)   Collection Time: 10/08/23  9:14 PM   Specimen: BLOOD RIGHT ARM  Result Value Ref Range Status   Specimen Description BLOOD RIGHT ARM  Final   Special Requests   Final    BOTTLES DRAWN AEROBIC ONLY Blood Culture adequate volume   Culture   Final    NO GROWTH 3 DAYS Performed at [facility], [address], [place], [place] [address]    Report Status PENDING  Incomplete  C Difficile Quick Screen w PCR reflex     Status: Abnormal   Collection Time: 10/09/23  3:50 AM   Specimen: STOOL  Result Value Ref Range Status   C Diff antigen POSITIVE (A) NEGATIVE Final   C Diff toxin POSITIVE (A) NEGATIVE Final   C Diff interpretation Toxin producing C. difficile detected.  Final    Comment: CRITICAL RESULT CALLED TO, READ BACK BY AND VERIFIED WITH: PRUITT,G ON 10/09/23 AT 0505 BY PURDIE,J Performed at [facility], [address] [place], [place] [address]   Culture, blood (Routine X 2) w Reflex to ID Panel     Status: None (Preliminary result)   Collection Time:  10/10/23  5:38 PM   Specimen: BLOOD  Result Value Ref Range Status   Specimen Description BLOOD RIGHT ANTECUBITAL  Final   Special Requests   Final    BOTTLES DRAWN AEROBIC AND ANAEROBIC Blood Culture adequate volume   Culture   Final    NO GROWTH < 12 HOURS Performed at [facility], [address] [place], [place] [address]    Report Status PENDING  Incomplete  Culture, blood (Routine X 2) w Reflex to ID Panel     Status: None (Preliminary result)   Collection Time: 10/10/23  5:45 PM   Specimen: BLOOD  Result Value Ref Range Status   Specimen Description BLOOD LEFT ANTECUBITAL  Final   Special Requests   Final    BOTTLES DRAWN AEROBIC AND ANAEROBIC Blood Culture adequate volume   Culture    Final    NO GROWTH < 12 HOURS Performed at [facility], [address], [place], [place] [address]    Report Status PENDING  Incomplete   Pertinent Lab seen by me:    Latest Ref Rng & Units 10/10/2023    7:07 AM 10/09/2023    3:58 PM 10/09/2023   10:13 AM  CBC  WBC 4.0 - 10.5 K/uL 6.6     Hemoglobin 12.0 - 15.0 g/dL 69.6  29.5  28.4   Hematocrit 36.0 - 46.0 % 32.9  33.8  36.4   Platelets 150 - 400 K/uL 173         Latest Ref Rng & Units 10/10/2023    7:07 AM 10/09/2023    6:20 AM 10/08/2023    7:27 PM  CMP  Glucose 70 - 99 mg/dL 132  440  102   BUN 8 - 23 mg/dL 13  18  23    Creatinine 0.44 - 1.00 mg/dL 7.25  3.66  4.40   Sodium 135 - 145 mmol/L 139  137  132   Potassium 3.5 - 5.1 mmol/L 3.4  3.9  3.4   Chloride 98 - 111 mmol/L 106  105  101   CO2 22 - 32 mmol/L 25  27  20    Calcium 8.9 - 10.3 mg/dL 8.5  8.8  9.0   Total Protein 6.5 - 8.1 g/dL   6.5   Total Bilirubin 0.0 - 1.2 mg/dL   0.6   Alkaline Phos 38 - 126 U/L   173   AST 15 - 41 U/L   22   ALT 0 - 44 U/L   20    Pertinent Imagings/Other Imagings Plain films and CT images have been personally visualized and interpreted; radiology reports have been reviewed. Decision making incorporated into the Impression / Recommendations.  DG Chest Port 1 View Result Date: 10/08/2023 CLINICAL DATA:  Questionable sepsis - evaluate for abnormality EXAM: PORTABLE CHEST 1 VIEW COMPARISON:  CT chest 10/08/2023 FINDINGS: The heart and mediastinal contours are within normal limits. Other sclerotic plaque. No focal consolidation. No pulmonary edema. No pleural effusion. No pneumothorax. No acute osseous abnormality. IMPRESSION: 1. No active disease. 2.  Aortic Atherosclerosis (ICD10-I70.0). Electronically Signed   By: Tish Frederickson M.D.   On: 10/08/2023 21:26   CT Head Wo Contrast Result Date: 10/08/2023 CLINICAL DATA:  Mental status change, unknown cause EXAM: CT HEAD WITHOUT CONTRAST TECHNIQUE: Contiguous axial images were obtained from the base of  the skull through the vertex without intravenous contrast. RADIATION DOSE REDUCTION: This exam was performed according to the departmental dose-optimization program which includes automated exposure control, adjustment  of the mA and/or kV according to patient size and/or use of iterative reconstruction technique. COMPARISON:  CT head 06/11/2013 report without imaging. FINDINGS: Brain: No evidence of large-territorial acute infarction. No parenchymal hemorrhage. No mass lesion. No extra-axial collection. No mass effect or midline shift. No hydrocephalus. Basilar cisterns are patent. Vascular: No hyperdense vessel. Atherosclerotic calcifications are present within the cavernous internal carotid arteries. Skull: No acute fracture or focal lesion. Sinuses/Orbits: Paranasal sinuses and mastoid air cells are clear. The orbits are unremarkable. Other: None. IMPRESSION: No acute intracranial abnormality. Electronically Signed   By: Tish Frederickson M.D.   On: 10/08/2023 21:26   CT CHEST ABDOMEN PELVIS WO CONTRAST Result Date: 10/08/2023 CLINICAL DATA:  Sepsis.  Prior UTIs EXAM: CT CHEST, ABDOMEN AND PELVIS WITHOUT CONTRAST TECHNIQUE: Multidetector CT imaging of the chest, abdomen and pelvis was performed following the standard protocol without IV contrast. RADIATION DOSE REDUCTION: This exam was performed according to the departmental dose-optimization program which includes automated exposure control, adjustment of the mA and/or kV according to patient size and/or use of iterative reconstruction technique. COMPARISON:  None Available. FINDINGS: CT CHEST FINDINGS Cardiovascular: Normal heart size. No significant pericardial effusion. The thoracic aorta is normal in caliber. Mild atherosclerotic plaque of the thoracic aorta. At least 3 vessel coronary artery calcifications. Mediastinum/Nodes: No enlarged mediastinal, hilar, or axillary lymph nodes. Thyroid gland, trachea, and esophagus demonstrate no significant findings.  Tiny hiatal hernia for in Lungs/Pleura: Diffuse mild bronchial wall thickening. No focal consolidation. No pulmonary nodule. No pulmonary mass. No pleural effusion. No pneumothorax. Musculoskeletal: No chest wall abnormality. No suspicious lytic or blastic osseous lesions. No acute displaced fracture. Cervical spine surgical hardware. CT ABDOMEN PELVIS FINDINGS Hepatobiliary: No focal liver abnormality. Status post cholecystectomy. No biliary dilatation. Pancreas: No focal lesion. Slight haziness of the proximal pancreatic contour with trace fat stranding. No surrounding inflammatory changes. No main pancreatic ductal dilatation. Spleen: Normal in size without focal abnormality. Adrenals/Urinary Tract: No adrenal nodule bilaterally. Mild left hydroureteronephrosis. No right hydroureteronephrosis. No nephroureterolithiasis bilaterally. Urinary bladder wall circumferential urinary bladder wall thickening and mild perivesicular fat stranding. Stomach/Bowel: Stomach is within normal limits. No evidence of bowel wall thickening or dilatation. Colonic diverticulosis. Appendix appears normal. Vascular/Lymphatic: No abdominal aorta or iliac aneurysm. Severe atherosclerotic plaque of the aorta and its branches. No abdominal, pelvic, or inguinal lymphadenopathy. Reproductive: Status post hysterectomy. No adnexal masses. Other: No intraperitoneal free fluid. No intraperitoneal free gas. No organized fluid collection. Musculoskeletal: No abdominal wall hernia or abnormality. No suspicious lytic or blastic osseous lesions. No acute displaced fracture. Diffusely decreased bone density. L3-L4 posterolateral surgical hardware fusion. Multilevel degenerative changes spine. IMPRESSION: 1. No acute intrathoracic abnormality. 2. Cystitis. 3. Mild left hydroureteronephrosis. This may reflect the residual of a recently passed calculus or reflect chronic changes of obstructive uropathy or reflux. 4. Question mild acute pancreatitis.   Correlate with lipase levels. 5. Colonic diverticulosis with no acute diverticulitis. 6. Limited evaluation on this noncontrast study. Electronically Signed   By: Tish Frederickson M.D.   On: 10/08/2023 21:24    I have personally spent 80 minutes involved in face-to-face and non-face-to-face activities for this patient on the day of the visit. Professional time spent includes the following activities: Preparing to see the patient (review of tests), Obtaining and/or reviewing separately obtained history (admission/discharge record), Performing a medically appropriate examination and/or evaluation , Ordering medications/tests/procedures, referring and communicating with other health care professionals, Documenting clinical information in the EMR, Independently interpreting results (not separately reported),  Communicating results to the patient/family/caregiver, Counseling and educating the patient/family/caregiver and Care coordination (not separately reported). No charge note Electronically signed by:   Plan d/w requesting provider as well as ID pharm D  Of note,  Assessment and plan are based on chart review as patient is not seen in person.  Portions of this note may have been created with voice recognition software. While this note has been edited for accuracy, occasional wrong-word or 'sound-a-like' substitutions may have occurred due to the inherent limitations of voice recognition software.   Odette Fraction, MD Infectious Disease Physician [facility] for Infect[facility] Disease Pager: [phone]

## 2023-10-11 NOTE — Plan of Care (Signed)

## 2023-10-12 ENCOUNTER — Inpatient Hospital Stay (HOSPITAL_COMMUNITY)

## 2023-10-12 DIAGNOSIS — N390 Urinary tract infection, site not specified: Secondary | ICD-10-CM | POA: Diagnosis not present

## 2023-10-12 DIAGNOSIS — A4150 Gram-negative sepsis, unspecified: Secondary | ICD-10-CM | POA: Diagnosis not present

## 2023-10-12 DIAGNOSIS — R7881 Bacteremia: Secondary | ICD-10-CM | POA: Diagnosis not present

## 2023-10-12 LAB — CBC
HCT: 35.7 % — ABNORMAL LOW (ref 36.0–46.0)
Hemoglobin: 11.2 g/dL — ABNORMAL LOW (ref 12.0–15.0)
MCH: 29.2 pg (ref 26.0–34.0)
MCHC: 31.4 g/dL (ref 30.0–36.0)
MCV: 93 fL (ref 80.0–100.0)
Platelets: 206 10*3/uL (ref 150–400)
RBC: 3.84 MIL/uL — ABNORMAL LOW (ref 3.87–5.11)
RDW: 13.9 % (ref 11.5–15.5)
WBC: 7.2 10*3/uL (ref 4.0–10.5)
nRBC: 0 % (ref 0.0–0.2)

## 2023-10-12 LAB — BASIC METABOLIC PANEL
Anion gap: 7 (ref 5–15)
BUN: 9 mg/dL (ref 8–23)
CO2: 26 mmol/L (ref 22–32)
Calcium: 9.3 mg/dL (ref 8.9–10.3)
Chloride: 107 mmol/L (ref 98–111)
Creatinine, Ser: 0.65 mg/dL (ref 0.44–1.00)
GFR, Estimated: >60 mL/min (ref >60)
Glucose, Bld: 190 mg/dL — ABNORMAL HIGH (ref 70–99)
Potassium: 4.5 mmol/L (ref 3.5–5.1)
Sodium: 140 mmol/L (ref 135–145)

## 2023-10-12 LAB — GLUCOSE, CAPILLARY
Glucose-Capillary: 206 mg/dL — ABNORMAL HIGH (ref 70–99)
Glucose-Capillary: 161 mg/dL — ABNORMAL HIGH (ref 70–99)
Glucose-Capillary: 188 mg/dL — ABNORMAL HIGH (ref 70–99)
Glucose-Capillary: 161 mg/dL — ABNORMAL HIGH (ref 70–99)

## 2023-10-12 LAB — ECHOCARDIOGRAM COMPLETE
Area-P 1/2: 5.13 cm2
Height: 66 in
S' Lateral: 2.8 cm
Weight: 3372.16 [oz_av]

## 2023-10-12 MED ORDER — INSULIN ASPART 100 UNIT/ML IJ SOLN
3.0000 [IU] | Freq: Three times a day (TID) | INTRAMUSCULAR | Status: DC
  Administered 2023-10-12 – 2023-10-14 (×6): 3 [IU] via SUBCUTANEOUS

## 2023-10-12 MED ORDER — INSULIN ASPART 100 UNIT/ML IJ SOLN
0.0000 [IU] | Freq: Every day | INTRAMUSCULAR | Status: DC
  Administered 2023-10-13: 2 [IU] via SUBCUTANEOUS

## 2023-10-12 MED ORDER — INSULIN ASPART 100 UNIT/ML IJ SOLN
0.0000 [IU] | Freq: Three times a day (TID) | INTRAMUSCULAR | Status: DC
  Administered 2023-10-12 – 2023-10-13 (×4): 3 [IU] via SUBCUTANEOUS
  Administered 2023-10-13: 5 [IU] via SUBCUTANEOUS
  Administered 2023-10-14: 2 [IU] via SUBCUTANEOUS
  Administered 2023-10-14 (×2): 5 [IU] via SUBCUTANEOUS

## 2023-10-12 NOTE — Inpatient Diabetes Management (Signed)
 Inpatient Diabetes Program Recommendations  AACE/ADA: New Consensus Statement on Inpatient Glycemic Control (2015)  Target Ranges:  Prepandial:   less than 140 mg/dL      Peak postprandial:   less than 180 mg/dL (1-2 hours)      Critically ill patients:  140 - 180 mg/dL   Lab Results  Component Value Date   GLUCAP 299 (H) 10/11/2023   HGBA1C 12.4 (H) 10/09/2023    Latest Reference Range & Units 10/10/23 07:31 10/10/23 11:10 10/10/23 16:15 10/10/23 20:47 10/11/23 07:47 10/11/23 11:40 10/11/23 16:23 10/11/23 20:45  Glucose-Capillary 70 - 99 mg/dL 161 (H) 096 (H) 045 (H) 212 (H) 262 (H) 190 (H) 286 (H) 299 (H)  (H): Data is abnormally high  Diabetes history: DM2 Outpatient Diabetes medications:  Novolin 70/30 insulin 40 units bid (equals approximately 56 units basal + 24 units meal coverage, Metformin 1 gm bid,  Mounjaro 5 mg weekly Current orders for Inpatient glycemic control: Lantus 20 units daily, Novolog 0-9 units tid, 0-5 units hs correction  Inpatient Diabetes Program Recommendations:   Noted patient received total of Novolog 13 units Please consider: -Add Novolog 3 units tid meal coverage if eats 50% -Add carb mod to current diet order if appropriate  Thank you, Darel Hong E. Dejha King, RN, MSN, CDCES  Diabetes Coordinator Inpatient Glycemic Control Team Team Pager [phone] (8am-5pm) 10/12/2023 8:53 AM

## 2023-10-12 NOTE — Plan of Care (Signed)

## 2023-10-12 NOTE — Progress Notes (Signed)
 Progress Note   Patient: Amy Livingston UJW:[id number] DOB: [birth year] DOA: 10/08/2023     4 DOS: the patient was seen and examined on 10/12/2023   Brief hospital admission narrative: As per H&P written by Dr. Arville Care on 10/08/23 Amy Livingston is a 68 y.o. Caucasian female with medical history significant for anxiety, atrial fibrillation, type diabetes mellitus, dysrhythmia, GERD, and essential hypertension, who presented to the emergency room with acute onset of generalized weakness tonight and an episode of syncope when she was on the toilet this evening required increased assistance from her daughter to even stand up.  Her daughter witnessed to have her having a syncopal episode at the time that lasted 10 to 20 seconds.  There was no head injury or other injuries.  She had mild twitching during the episode.  She had a quick return of mental status baseline afterwards.  She denied any history of seizures.  She was noted to be mildly hypoxic on arrival to the hospital.  She was complaining of chronic back pain.  No paresthesias or focal muscle weakness.  No chest pain or palpitations.  No cough or wheezing or dyspnea.  Per her daughter she has been having visual hesitations intermittently since January.  She is also been having episodes of syncope when she stands up from sitting or lying down position.   ED Course: When the she came to the ER, temperature was 97.5 and heart rate was 118 with otherwise normal vital signs.  Labs revealed a pH of 7.34 and HCO3 of 27.4.  CMP revealed mild hyponatremia and hypokalemia, hyperglycemia of 436 and CO2 of 21 with alk phos 173 and albumin 3.2.  Lipase was 35 and ammonia level 16.  High sensitive troponin I was 3 twice and BNP 39.  Lactic acid was 3.9 and later 2.9.  CBC was within normal.  Respiratory panel came back negative.  UA was positive for UTI.  Urine drug screen was negative.  Alcohol level was less than 10.  Blood culture was drawn as well as urine culture. EKG  as reviewed by me :  EKG showed sinus tachycardia with a rate of 117 with borderline prolonged QT interval with QTc of 494 mL. Imaging: None Acid CT scan revealed no acute intracranial abnormalities.  Portable chest x-ray showed aortic atherosclerosis with no acute cardiopulmonary disease.   The patient was given IV cefepime, Flagyl and 2.5 L of IV lactated ringer bolus followed by 150 mL/h as well as 1 p.o. fluconazole.  She was placed on IV Levophed briefly that was later tapered off with improved blood pressure.  The patient will be admitted to a medical telemetry bed for further evaluation and management.  Assessment and Plan: 1)Sepsis due to Staph lugdunensis bacteremia  ? source -Case previously discussed with ID Merceda Elks, MD and Dr. Elinor Parkinson) -Infectious disease input appreciated -Blood cultures from 10/08/2023 with staph as above -Repeat blood cultures from 10/10/2023 NGTD -Antibiotics:  Vancomycin 3/3-c Cefepime 3/1, ceftriaxone 3/1-c Metronidazole 3/1-c PO Vancomycin 3/2-  -Cefazolin started on 10/11/2023 after discontinuing IV Vanco --Transthoracic echo from 10/12/2023 without vegetations -Defer to ID team if TEE recommended  2)C.Diff  infection/bright red blood per rectum -Patient has lost over 100 pounds over the last couple of years due to ongoing diarrhea -Patient had EGD and colonoscopy on 07/25/2023 without significant findings except for gastritis, hemorrhoids, diverticulosis and polyps----colon biopsies with pathology showing Colonic mucosa with no significant diagnostic alteration.  No evidence of lymphocytic colitis or collagenous colitis. No  evidence of activity, chronicity, granuloma, dysplasia or  malignancy.  Continue oral vancomycin -Maintain adequate hydration -Case discussed with GI service and planning to provide supportive care -Continue Anusol -Continue holding on further endoscopic evaluation at the moment -Continue to follow hemoglobin trend and transfuse as  needed -Bleeding episode most likely driven by internal hemorrhoids or diverticulosis.  3)Uncontrolled type 2 diabetes mellitus with hyperglycemia, with long-term current use of insulin (HCC) -Continue holding oral hypoglycemic agents while inpatient. -Patient Semglee dose has been adjusted to 20 units Use Novolog/Humalog Sliding scale insulin with Accu-Cheks/Fingersticks as ordered   4)Hypokalemia - Magnesium within normal limits -Potassium has normalized with replacement  5)Syncope and collapse - With concerns for sepsis physiology and hypotension -Holding antihypertensive agents except for metoprolol -Continue to maintain adequate hydration -Continue telemetry monitoring -Continue treatment with antibiotics as mentioned above for sepsis -Continue supportive care and follow clinical response.  6)Hyponatremia - In the setting of GI losses and dehydration -Continue fluid resuscitation and follow electrolytes trend.  7)Anxiety and depression - No suicidal ideation hallucination -Continue BuSpar and Zoloft  8)Paroxysmal atrial fibrillation (HCC) -Continue metoprolol -Holding Eliquis acutely in the setting of GI bleed; per GI service safe to resume after holding for 2 more days. -SCDs in place.  Peripheral neuropathy - Continue treatment with Neurontin.  Hypothyroidism/dyslipidemia -Continue Synthroid.   -Continue statin.    Subjective:  -Remains generally weak with malaise -No fevers -No emesis  Physical Exam: Vitals:   10/11/23 1316 10/11/23 2042 10/12/23 0506 10/12/23 1259  BP: 105/72 124/74 118/89 129/82  Pulse: 87 92 (!) 106 92  Resp: 19 20 19 17   Temp: 97.8 F (36.6 C) 98.2 F (36.8 C) 98.2 F (36.8 C) 98.5 F (36.9 C)  TempSrc:  Oral Oral   SpO2: 100% 100% 100% 99%  Weight:      Height:        Physical Exam Gen:- Awake Alert, in no acute distress  HEENT:- [place].AT, No sclera icterus Neck-Supple Neck,No JVD,.  Lungs-  CTAB , fair air movement  bilaterally  CV- S1, S2 normal, RRR Abd-  +ve B.Sounds, Abd Soft, No tenderness,    Extremity/Skin:- No  edema,   good pedal pulses  Psych-affect is appropriate, oriented x3 Neuro-generalized weakness, no new focal deficits, no tremors   Family Communication: No family at bedside, patient son Moise Boring is primary contact  Disposition: home Status is: Inpatient Remains inpatient appropriate because: Continue IV antibiotics.   Planned Discharge Destination: Home  Author: Shon Hale, MD 10/12/2023 5:31 PM  For on call review [email or web address].

## 2023-10-12 NOTE — Progress Notes (Signed)
 ID Brief note   Afebrile     Latest Ref Rng & Units 10/12/2023    3:48 AM 10/10/2023    7:07 AM 10/09/2023    3:58 PM  CBC  WBC 4.0 - 10.5 K/uL 7.2  6.6    Hemoglobin 12.0 - 15.0 g/dL 16.1  09.6  04.5   Hematocrit 36.0 - 46.0 % 35.7  32.9  33.8   Platelets 150 - 400 K/uL 206  173        Latest Ref Rng & Units 10/12/2023    3:48 AM 10/10/2023    7:07 AM 10/09/2023    6:20 AM  CMP  Glucose 70 - 99 mg/dL 409  811  914   BUN 8 - 23 mg/dL 9  13  18    Creatinine 0.44 - 1.00 mg/dL 7.82  9.56  2.13   Sodium 135 - 145 mmol/L 140  139  137   Potassium 3.5 - 5.1 mmol/L 4.5  3.4  3.9   Chloride 98 - 111 mmol/L 107  106  105   CO2 22 - 32 mmol/L 26  25  27    Calcium 8.9 - 10.3 mg/dL 9.3  8.5  8.8    3/3 repeat blood cx NG in 2 days  TTE no definite vegetations Recommend to get TEE given no clear source of bacteremia  Continue cefazolin Monitor CBC, CMP Monitor for metastatic sites of infection Final recommendations to follow post TEE D/w primary team  Odette Fraction, MD Infectious Disease Physician [facility] for Infectious Disease [address] [place], [place] [address] Phone: [phone]  Fax: [phone]

## 2023-10-12 NOTE — Care Management Important Message (Signed)
 Important Message  Patient Details  Name: Amy Livingston MRN: [id number] Date of Birth: 07/30/1956   Important Message Given:  Yes - Medicare IM     Corey Harold 10/12/2023, 11:23 AM

## 2023-10-13 ENCOUNTER — Other Ambulatory Visit (HOSPITAL_COMMUNITY): Payer: Self-pay | Admitting: *Deleted

## 2023-10-13 ENCOUNTER — Ambulatory Visit: Payer: Medicare Other | Admitting: Family Medicine

## 2023-10-13 ENCOUNTER — Encounter (HOSPITAL_COMMUNITY)
Admission: EM | Disposition: A | Discharge status: Home Health | Payer: Self-pay | Source: Home / Self Care | Attending: Internal Medicine

## 2023-10-13 ENCOUNTER — Inpatient Hospital Stay (HOSPITAL_COMMUNITY)

## 2023-10-13 ENCOUNTER — Encounter (HOSPITAL_COMMUNITY): Payer: Self-pay | Admitting: Family Medicine

## 2023-10-13 DIAGNOSIS — N390 Urinary tract infection, site not specified: Secondary | ICD-10-CM | POA: Diagnosis not present

## 2023-10-13 DIAGNOSIS — A4150 Gram-negative sepsis, unspecified: Secondary | ICD-10-CM | POA: Diagnosis not present

## 2023-10-13 DIAGNOSIS — I480 Paroxysmal atrial fibrillation: Secondary | ICD-10-CM

## 2023-10-13 DIAGNOSIS — R7881 Bacteremia: Secondary | ICD-10-CM

## 2023-10-13 DIAGNOSIS — I4891 Unspecified atrial fibrillation: Secondary | ICD-10-CM

## 2023-10-13 DIAGNOSIS — B9689 Other specified bacterial agents as the cause of diseases classified elsewhere: Secondary | ICD-10-CM

## 2023-10-13 HISTORY — PX: TEE WITHOUT CARDIOVERSION: SHX5443

## 2023-10-13 LAB — GLUCOSE, CAPILLARY
Glucose-Capillary: 199 mg/dL — ABNORMAL HIGH (ref 70–99)
Glucose-Capillary: 222 mg/dL — ABNORMAL HIGH (ref 70–99)
Glucose-Capillary: 197 mg/dL — ABNORMAL HIGH (ref 70–99)
Glucose-Capillary: 240 mg/dL — ABNORMAL HIGH (ref 70–99)

## 2023-10-13 LAB — CULTURE, BLOOD (ROUTINE X 2): Special Requests: ADEQUATE

## 2023-10-13 LAB — ECHO TEE

## 2023-10-13 SURGERY — ECHOCARDIOGRAM, TRANSESOPHAGEAL
Anesthesia: General

## 2023-10-13 MED ORDER — BUTAMBEN-TETRACAINE-BENZOCAINE 2-2-14 % EX AERO
INHALATION_SPRAY | CUTANEOUS | Status: DC | PRN
  Administered 2023-10-13: 2 via TOPICAL

## 2023-10-13 MED ORDER — PHENYLEPHRINE 80 MCG/ML (10ML) SYRINGE FOR IV PUSH (FOR BLOOD PRESSURE SUPPORT)
PREFILLED_SYRINGE | INTRAVENOUS | Status: DC | PRN
  Administered 2023-10-13: 160 ug via INTRAVENOUS

## 2023-10-13 MED ORDER — PROPOFOL 500 MG/50ML IV EMUL
INTRAVENOUS | Status: DC | PRN
  Administered 2023-10-13: 125 ug/kg/min via INTRAVENOUS
  Administered 2023-10-13: 80 mg via INTRAVENOUS
  Administered 2023-10-13: 50 mg via INTRAVENOUS

## 2023-10-13 MED ORDER — SODIUM CHLORIDE 0.9% FLUSH
3.0000 mL | Freq: Two times a day (BID) | INTRAVENOUS | Status: DC
Start: 2023-10-13 — End: 2023-10-13
  Administered 2023-10-13: 10 mL via INTRAVENOUS

## 2023-10-13 MED ORDER — PHENYLEPHRINE HCL (PRESSORS) 10 MG/ML IV SOLN
INTRAVENOUS | Status: DC | PRN
  Administered 2023-10-13: 160 ug via INTRAVENOUS
  Administered 2023-10-13: 80 ug via INTRAVENOUS
  Administered 2023-10-13 (×3): 160 ug via INTRAVENOUS

## 2023-10-13 MED ORDER — LACTATED RINGERS IV SOLN: INTRAVENOUS | Status: DC

## 2023-10-13 MED ORDER — LACTATED RINGERS IV SOLN: INTRAVENOUS | Status: DC | PRN

## 2023-10-13 MED ORDER — SODIUM CHLORIDE 0.9% FLUSH: 3.0000 mL | INTRAVENOUS | Status: DC | PRN

## 2023-10-13 MED ORDER — EPHEDRINE SULFATE-NACL 50-0.9 MG/10ML-% IV SOSY
PREFILLED_SYRINGE | INTRAVENOUS | Status: DC | PRN
  Administered 2023-10-13: 5 mg via INTRAVENOUS
  Administered 2023-10-13: 10 mg via INTRAVENOUS

## 2023-10-13 NOTE — Transfer of Care (Signed)
 Immediate Anesthesia Transfer of Care Note  Patient: Amy Livingston  Procedure(s) Performed: ECHOCARDIOGRAM, TRANSESOPHAGEAL  Patient Location: PACU  Anesthesia Type:General  Level of Consciousness: awake  Airway & Oxygen Therapy: Patient Spontanous Breathing  Post-op Assessment: Report given to RN  Post vital signs: Reviewed and stable  Last Vitals:  Vitals Value Taken Time  BP 93/55 10/13/23 1345  Temp    Pulse 87 10/13/23 1345  Resp 15 10/13/23 1345  SpO2 100 % 10/13/23 1345  Vitals shown include unfiled device data.  Last Pain:  Vitals:   10/13/23 1220  TempSrc:   PainSc: 6       Patients Stated Pain Goal: 5 (10/13/23 1220)  Complications: No notable events documented.

## 2023-10-13 NOTE — Progress Notes (Signed)
 Progress Note   Patient: Amy Livingston ZOX:[id number] DOB: April 30, 1956 DOA: 10/08/2023     5 DOS: the patient was seen and examined on 10/13/2023   Brief hospital admission narrative: As per H&P written by Dr. Arville Care on 10/08/23 Amy Livingston is a 68 y.o. Caucasian female with medical history significant for anxiety, atrial fibrillation, type diabetes mellitus, dysrhythmia, GERD, and essential hypertension, who presented to the emergency room with acute onset of generalized weakness tonight and an episode of syncope when she was on the toilet this evening required increased assistance from her daughter to even stand up.  Her daughter witnessed to have her having a syncopal episode at the time that lasted 10 to 20 seconds.  There was no head injury or other injuries.  She had mild twitching during the episode.  She had a quick return of mental status baseline afterwards.  She denied any history of seizures.  She was noted to be mildly hypoxic on arrival to the hospital.  She was complaining of chronic back pain.  No paresthesias or focal muscle weakness.  No chest pain or palpitations.  No cough or wheezing or dyspnea.  Per her daughter she has been having visual hesitations intermittently since January.  She is also been having episodes of syncope when she stands up from sitting or lying down position.   ED Course: When the she came to the ER, temperature was 97.5 and heart rate was 118 with otherwise normal vital signs.  Labs revealed a pH of 7.34 and HCO3 of 27.4.  CMP revealed mild hyponatremia and hypokalemia, hyperglycemia of 436 and CO2 of 21 with alk phos 173 and albumin 3.2.  Lipase was 35 and ammonia level 16.  High sensitive troponin I was 3 twice and BNP 39.  Lactic acid was 3.9 and later 2.9.  CBC was within normal.  Respiratory panel came back negative.  UA was positive for UTI.  Urine drug screen was negative.  Alcohol level was less than 10.  Blood culture was drawn as well as urine culture. EKG  as reviewed by me :  EKG showed sinus tachycardia with a rate of 117 with borderline prolonged QT interval with QTc of 494 mL. Imaging: None Acid CT scan revealed no acute intracranial abnormalities.  Portable chest x-ray showed aortic atherosclerosis with no acute cardiopulmonary disease.   The patient was given IV cefepime, Flagyl and 2.5 L of IV lactated ringer bolus followed by 150 mL/h as well as 1 p.o. fluconazole.  She was placed on IV Levophed briefly that was later tapered off with improved blood pressure.  The patient will be admitted to a medical telemetry bed for further evaluation and management.  Assessment and Plan: 1)Sepsis due to Staph lugdunensis bacteremia  ? source -Case previously discussed with ID Merceda Elks, MD and Dr. Elinor Parkinson) -Infectious disease input appreciated -Blood cultures from 10/08/2023 with staph as above -Repeat blood cultures from 10/10/2023 NGTD ---Transthoracic echo from 10/12/2023 without vegetations -TEE from 10/13/23 without vegetations -Per ID team possible PICC line placement on 10/14/2023 blood cultures remain negative and possible discharge on IV antibiotics -Antibiotics:  Vancomycin 3/3-c Cefepime 3/1, ceftriaxone 3/1-c Metronidazole 3/1-c PO Vancomycin 3/2-  -Cefazolin started on 10/11/2023 after discontinuing IV Vanco   2)C.Diff  infection/bright red blood per rectum -Patient has lost over 100 pounds over the last couple of years due to ongoing diarrhea -Patient had EGD and colonoscopy on 07/25/2023 without significant findings except for gastritis, hemorrhoids, diverticulosis and polyps----colon biopsies with pathology  showing Colonic mucosa with no significant diagnostic alteration.  No evidence of lymphocytic colitis or collagenous colitis. No evidence of activity, chronicity, granuloma, dysplasia or  malignancy.  Continue oral vancomycin -Maintain adequate hydration -Continue Anusol -Continue holding on further endoscopic evaluation at the  moment -Continue to follow hemoglobin trend and transfuse as needed -Bleeding episode most likely driven by internal hemorrhoids or diverticulosis. -Frequently and volume of stools has improved on oral vancomycin -GI input appreciated  3)Uncontrolled type 2 diabetes mellitus with hyperglycemia, with long-term current use of insulin (HCC) -Continue holding oral hypoglycemic agents while inpatient. -Patient Semglee dose has been adjusted to 20 units Use Novolog/Humalog Sliding scale insulin with Accu-Cheks/Fingersticks as ordered   4)Hypokalemia - Magnesium within normal limits -Potassium has normalized with replacement  5)Syncope and collapse - With concerns for sepsis physiology and hypotension -Holding antihypertensive agents except for metoprolol -Continue to maintain adequate hydration -Continue telemetry monitoring -Continue treatment with antibiotics as mentioned above for sepsis -Continue supportive care and follow clinical response.  6)Hyponatremia - In the setting of GI losses and dehydration -Continue fluid resuscitation and follow electrolytes trend.  7)Anxiety and depression - No suicidal ideation hallucination -Continue BuSpar and Zoloft  8)Paroxysmal atrial fibrillation (HCC) -Continue metoprolol -Holding Eliquis acutely in the setting of GI bleed; per GI service safe to resume after holding for 2 more days. -SCDs in place.  Peripheral neuropathy - Continue treatment with Neurontin.  Hypothyroidism/dyslipidemia -Continue Synthroid.   -Continue statin.    Subjective:  -Frequently and volume of stools has improved No fever  Or chills  No emesis -Tolerated TEE well today  Physical Exam: Vitals:   10/13/23 1220 10/13/23 1345 10/13/23 1358 10/13/23 1422  BP: (!) 140/78 (!) 93/55 (!) 104/57 (!) 96/59  Pulse: 87 87 88 88  Resp: 12 15 14 18   Temp: 98.1 F (36.7 C) 97.8 F (36.6 C)  97.9 F (36.6 C)  TempSrc:    Oral  SpO2: 99% 100% 97% 99%  Weight:  95.6 kg     Height: 5\' 6"  (1.676 m)       Physical Exam Gen:- Awake Alert, in no acute distress  HEENT:- [place].AT, No sclera icterus Neck-Supple Neck,No JVD,.  Lungs-  CTAB , fair air movement bilaterally  CV- S1, S2 normal, RRR  Abd-  +ve B.Sounds, Abd Soft, No tenderness,    Extremity/Skin:- No  edema,   good pedal pulses  Psych-affect is appropriate, oriented x3 Neuro-generalized weakness, no new focal deficits, no tremors   Family Communication: No family at bedside, patient son Moise Boring is primary contact  Disposition: home Status is: Inpatient Remains inpatient appropriate because: Continue IV antibiotics.  Author: Shon Hale, MD 10/13/2023 6:06 PM  For on call review [email or web address].

## 2023-10-13 NOTE — Telephone Encounter (Signed)
 Called to schedule, no answer and left message asking pt to call office to get scheduled

## 2023-10-13 NOTE — CV Procedure (Signed)
   TRANSESOPHAGEAL ECHOCARDIOGRAM  NAME:  Amy Livingston    MRN: [id number] DOB:  08/18/1955    ADMIT DATE: 10/08/2023  INDICATIONS: Staph lugdunensis bacteremia, r/o infective endocarditis  PROCEDURE:  Informed consent was obtained prior to the procedure. After a procedural time-out, the oropharynx was anesthetized and the patient was sedated by the anesthesia service. The transesophageal probe was inserted in the esophagus and stomach without difficulty and multiple views were obtained. Anesthesia was monitored by Dr. Johnnette Litter.   FINDINGS: No evidence of vegetations on aortic, mitral, tricuspid and pulmonic valves. Please refer to TEE report for details.   [name], MD [place]  [facility]  2:05 PM

## 2023-10-13 NOTE — Progress Notes (Addendum)
*  PRELIMINARY RESULTS* Echocardiogram Transesophgeal echocardiogram has been performed.  Stacey Drain 10/13/2023, 2:58 PM

## 2023-10-13 NOTE — Discharge Instructions (Addendum)
 1)Home health RN to help with home infusion of antibiotic -cefazolin 2 g IV every 8 hours via PICC Line for Staphylococcus Lugdunensis bacteremia  First Dose: Yes Last Day of Therapy:  10/24/23  (2 weeks of antibiotic therapy) Labs - Once weekly:  CBC/D and BMP, Labs - Once weekly: ESR and CRP Method of administration: IV Push  2)Avoid ibuprofen/Advil/Aleve/Motrin/Goody Powders/Naproxen/BC powders/Meloxicam/Diclofenac/Indomethacin and other Nonsteroidal anti-inflammatory medications as these will make you more likely to bleed and can cause stomach ulcers, can also cause Kidney problems.  3)Picc Line Care Per Protocol- Home health RN for IV administration and teaching; PICC line care and labs.   -Please pull PIC at completion of IV antibiotics in 2 weeks time Fax weekly labs to [phone]  Clinic Follow Up Appt: 10/27/23 @9 : 45 am at  [facility] for Infectious Disease [address] [place], [place] [address] Phone: [phone]  Fax: [phone]   Food Resources:  Agency Name: [facility] Address: [address] [place], [place] [address] Phone: [phone] Website: www.adtsrc.org Service(s) Offered: Meals on [employer] and Meals with Friends.  Home care, at home assisted living, volunteer services, Center  for Active Retirement, transportation  Agency Name: [facility] Address: Sites vary. Much call first. Food Pantry locations: [address] [place], [place] [address] [employer] Phone: [phone] Website: none Service(s) Offered: Museum/gallery curator, utility assistance if funds available Careers adviser for all of [place], [employer], [employer], Office manager and [employer] for [employer] area only) Walk-in  current Id and current address verification required. WedThurs: 9:30-12:00  Agency Name: [facility] Address: [address] [place], [place]  [address] Phone: [phone] or [phone] Website: none Service(s) Offered: Food assistance.  Agency Name: [facility] Address: [address] [place], [place] [address] Phone: [phone] Website: BirthdayFever.[email or web address] Service(s) Offered: Food assistance Monday Nights 5:00PM-5:30PM Agency Name: [employer] Address: [address], [place], [place] [address] Phone: [phone] or [phone] Website: none Service(s) Offered: Serves 1 hot meal a day at 11:00 am Monday-Sunday and 5 pm  on the second and fourth Sunday of each month  Agency Name: [facility] Department of Health and Human/Social Services Address: [address], [place], [place] [address] Phone: [phone] Website: [email or web address].[email or web address] Service(s) Offered: Sales executive, WIC program. Agency Name: Holiday representative of [facility] Address: [address], [place], [place] / [address] [place], [place]  November 29, 2022 7 Phone: [phone] [place] / [phone] [place] Website: OpinionTrades.tn NetworkAffair.co.za  Service(s) Offered: Civil Service fast streamer, food pantry, soup kitchen ([place]) emergency financial  assistance, thrift stores, showers & hygiene products ([place]),  Christmas Assistance, spiritual help.  Agency Name: [facility] Address: [address] [place], [place] [address] Phone: [phone] Website: [email or web address] Service(s) Offered: Food pantry Tuesday, Wednesday and Thursday 9am-11:30am  (need appointment) and health clinic (9:00am-11:00am

## 2023-10-13 NOTE — Anesthesia Preprocedure Evaluation (Addendum)
 Anesthesia Evaluation  Patient identified by MRN, date of birth, ID band Patient awake    Reviewed: Allergy & Precautions, H&P , NPO status , Patient's Chart, lab work & pertinent test results, reviewed documented beta blocker date and time   History of Anesthesia Complications (+) PONV and history of anesthetic complications  Airway Mallampati: II  TM Distance: >3 FB Neck ROM: full    Dental no notable dental hx.    Pulmonary neg pulmonary ROS, shortness of breath   Pulmonary exam normal breath sounds clear to auscultation       Cardiovascular Exercise Tolerance: Good hypertension, + dysrhythmias  Rhythm:regular Rate:Normal     Neuro/Psych  PSYCHIATRIC DISORDERS Anxiety Depression     Neuromuscular disease negative neurological ROS  negative psych ROS   GI/Hepatic negative GI ROS, Neg liver ROS,GERD  ,,  Endo/Other  diabetes    Renal/GU negative Renal ROS  negative genitourinary   Musculoskeletal   Abdominal   Peds  Hematology negative hematology ROS (+)   Anesthesia Other Findings   Reproductive/Obstetrics negative OB ROS                             Anesthesia Physical Anesthesia Plan  ASA: 4 and emergent  Anesthesia Plan: General   Post-op Pain Management:    Induction:   PONV Risk Score and Plan: Propofol infusion  Airway Management Planned:   Additional Equipment:   Intra-op Plan:   Post-operative Plan:   Informed Consent: I have reviewed the patients History and Physical, chart, labs and discussed the procedure including the risks, benefits and alternatives for the proposed anesthesia with the patient or authorized representative who has indicated his/her understanding and acceptance.     Dental Advisory Given  Plan Discussed with: CRNA  Anesthesia Plan Comments:        Anesthesia Quick Evaluation

## 2023-10-13 NOTE — Progress Notes (Signed)
   [facility] has been requested to perform a transesophageal echocardiogram on Amy Livingston for bacteremia.    The patient does NOT have any absolute or relative contraindications to a Transesophageal Echocardiogram (TEE).  The patient has: No other conditions that may impact this procedure. Labs from 10/12/2023 show electrolytes WNL. Hgb at 11.2 and platelet count at 206 K. BP has been stable with no requirement for pressor support.   After careful review of history and examination, the risks and benefits of transesophageal echocardiogram have been explained including risks of esophageal damage, perforation (1:10,000 risk), bleeding, pharyngeal hematoma as well as other potential complications associated with conscious sedation including aspiration, arrhythmia, respiratory failure and death. Alternatives to treatment were discussed, questions were answered. Patient is willing to proceed.   TEE scheduled for today at 1300 with Dr. Jenene Slicker. Echo and anesthesia aware.   Signed, Ellsworth Lennox, PA-C  10/13/2023 8:22 AM

## 2023-10-13 NOTE — H&P (Signed)
 CARDIOLOGY CONSULT NOTE    Patient ID: Amy Livingston; [id number]; 02-01-1956   Admit date: 10/08/2023 Date of Consult: 10/13/2023  Primary Care Provider: Gilmore Laroche, FNP Primary Cardiologist:  Primary Electrophysiologist:     History of Present Illness:   Amy Livingston is currently admitted to the hospitalist team for the management of sepsis secondary to staph lugdunensis bacteremia.  Transthoracic echocardiogram showed normal LVEF and no evidence of vegetations but due to poor acoustic windows, TEE was recommended.  Infectious disease team is currently on board and recommended TEE as well to determine the duration of antibiotics.  Past Medical History:  Diagnosis Date   Anxiety    Atrial fibrillation (HCC)    Diabetes mellitus without complication (HCC)    Dysrhythmia    GERD (gastroesophageal reflux disease)    Hypertension    PONV (postoperative nausea and vomiting)    Shortness of breath dyspnea     Past Surgical History:  Procedure Laterality Date   ABDOMINAL HYSTERECTOMY     ANTERIOR CERVICAL DECOMP/DISCECTOMY FUSION N/A 01/13/2016   Procedure: C5-6 ANTERIOR CERVICAL DECOMPRESSION/DISKECTOMY/FUSION;  Surgeon: Barnett Abu, MD;  Location: [facility];  Service: Neurosurgery;  Laterality: N/A;  C5-6 ANTERIOR CERVICAL DECOMPRESSION/DISKECTOMY/FUSION   BACK SURGERY     x 5   BIOPSY  07/25/2023   Procedure: BIOPSY;  Surgeon: Lanelle Bal, DO;  Location: [facility];  Service: Endoscopy;;   CHOLECYSTECTOMY     COLONOSCOPY WITH PROPOFOL N/A 07/25/2023   Procedure: COLONOSCOPY WITH PROPOFOL;  Surgeon: Lanelle Bal, DO;  Location: [facility];  Service: Endoscopy;  Laterality: N/A;  10:45 am, asa 3   ESOPHAGEAL BRUSHING  07/25/2023   Procedure: ESOPHAGEAL BRUSHING;  Surgeon: Lanelle Bal, DO;  Location: [facility];  Service: Endoscopy;;   ESOPHAGOGASTRODUODENOSCOPY (EGD) WITH PROPOFOL N/A 07/25/2023   Procedure: ESOPHAGOGASTRODUODENOSCOPY (EGD) WITH  PROPOFOL;  Surgeon: Lanelle Bal, DO;  Location: [facility];  Service: Endoscopy;  Laterality: N/A;   FOOT SURGERY     KNEE ARTHROSCOPY     left   KNEE SURGERY Right    POLYPECTOMY  07/25/2023   Procedure: POLYPECTOMY INTESTINAL;  Surgeon: Lanelle Bal, DO;  Location: [facility];  Service: Endoscopy;;   TUBAL LIGATION         Inpatient Medications: Scheduled Meds:  [MAR Hold] atorvastatin  80 mg Oral Daily   [MAR Hold] busPIRone  10 mg Oral BID   [MAR Hold] calcium carbonate  1 tablet Oral TID   [MAR Hold] gabapentin  300 mg Oral TID   [MAR Hold] hydrocortisone   Rectal BID   [MAR Hold] insulin aspart  0-15 Units Subcutaneous TID WC   [MAR Hold] insulin aspart  0-5 Units Subcutaneous QHS   [MAR Hold] insulin aspart  3 Units Subcutaneous TID WC   [MAR Hold] insulin glargine-yfgn  20 Units Subcutaneous QHS   [MAR Hold] metoprolol tartrate  50 mg Oral BID   [MAR Hold] mirabegron ER  25 mg Oral Daily   [MAR Hold] pantoprazole (PROTONIX) IV  40 mg Intravenous Q12H   [MAR Hold] sertraline  50 mg Oral Daily   [MAR Hold] sodium chloride flush  3-10 mL Intravenous Q12H   [MAR Hold] vancomycin  125 mg Oral QID   Continuous Infusions:  [MAR Hold]  ceFAZolin (ANCEF) IV 2 g (10/13/23 0550)   lactated ringers     PRN Meds: [MAR Hold] acetaminophen **OR** [MAR Hold] acetaminophen, [MAR Hold] meclizine, [MAR Hold]  ondansetron **OR** [MAR Hold] ondansetron (ZOFRAN) IV, [MAR Hold] oxyCODONE-acetaminophen, [MAR Hold] sodium chloride flush, [MAR Hold] traZODone  Allergies:    Allergies  Allergen Reactions   Elemental Sulfur Itching and Rash    Yeast infections   Metformin And Related Diarrhea    GI distress    Social History:   Social History   Socioeconomic History   Marital status: Divorced    Spouse name: Not on file   Number of children: Not on file   Years of education: Not on file   Highest education level: Some college, no degree  Occupational History   Not  on file  Tobacco Use   Smoking status: Never   Smokeless tobacco: Never  Substance and Sexual Activity   Alcohol use: No   Drug use: No   Sexual activity: Not on file  Other Topics Concern   Not on file  Social History Narrative   Not on file   Social Drivers of Health   Financial Resource Strain: Low Risk  (10/12/2023)   Overall Financial Resource Strain (CARDIA)    Difficulty of Paying Living Expenses: Not very hard  Food Insecurity: Food Insecurity Present (10/12/2023)   Hunger Vital Sign    Worried About Running Out of Food in the Last Year: Sometimes true    Ran Out of Food in the Last Year: Never true  Transportation Needs: No Transportation Needs (10/12/2023)   PRAPARE - Administrator, Civil Service (Medical): No    Lack of Transportation (Non-Medical): No  Physical Activity: Inactive (10/12/2023)   Exercise Vital Sign    Days of Exercise per Week: 0 days    Minutes of Exercise per Session: 0 min  Stress: Stress Concern Present (10/12/2023)   [employer] of Occupational Health - Occupational Stress Questionnaire    Feeling of Stress : Rather much  Social Connections: Socially Isolated (10/12/2023)   Social Connection and Isolation Panel [NHANES]    Frequency of Communication with Friends and Family: More than three times a week    Frequency of Social Gatherings with Friends and Family: Once a week    Attends Religious Services: Never    Database administrator or Organizations: No    Attends Banker Meetings: Patient declined    Marital Status: Divorced  Catering manager Violence: Not At Risk (10/09/2023)   Humiliation, Afraid, Rape, and Kick questionnaire    Fear of Current or Ex-Partner: No    Emotionally Abused: No    Physically Abused: No    Sexually Abused: No    Family History:  History reviewed. No pertinent family history.   ROS:  Please see the history of present illness.  ROS All other ROS reviewed and negative.     Physical  Exam/Data:   Vitals:   10/13/23 0814 10/13/23 1220 10/13/23 1345 10/13/23 1358  BP: (!) 99/55 (!) 140/78 (!) 93/55 (!) 104/57  Pulse: 90 87 87 88  Resp:  12 15 14   Temp:  98.1 F (36.7 C) 97.8 F (36.6 C)   TempSrc:      SpO2:  99% 100% 97%  Weight:  95.6 kg    Height:  5\' 6"  (1.676 m)      Intake/Output Summary (Last 24 hours) at 10/13/2023 1402 Last data filed at 10/13/2023 1345 Gross per 24 hour  Intake 1209.8 ml  Output 0 ml  Net 1209.8 ml   Filed Weights   10/08/23 1921 10/09/23 0809 10/13/23 1220  Weight:  95 kg 95.6 kg 95.6 kg   Body mass index is 34.02 kg/m.  General:  Well nourished, well developed, in no acute distress HEENT: normal Lymph: no adenopathy Neck: no JVD Endocrine:  No thryomegaly Vascular: No carotid bruits; FA pulses 2+ bilaterally without bruits  Cardiac:  normal S1, S2; RRR; no murmur Lungs:  clear to auscultation bilaterally, no wheezing, rhonchi or rales  Abd: soft, nontender, no hepatomegaly  Ext: no edema Musculoskeletal:  No deformities, BUE and BLE strength normal and equal Skin: warm and dry  Neuro:  CNs 2-12 intact, no focal abnormalities noted Psych:  Normal affect    Laboratory Data:  Chemistry Recent Labs  Lab 10/09/23 0620 10/10/23 0707 10/12/23 0348  NA 137 139 140  K 3.9 3.4* 4.5  CL 105 106 107  CO2 27 25 26   GLUCOSE 118* 220* 190*  BUN 18 13 9   CREATININE 0.66 0.55 0.65  CALCIUM 8.8* 8.5* 9.3  GFRNONAA >60 >60 >60  ANIONGAP 5 8 7     Recent Labs  Lab 10/08/23 1927  PROT 6.5  ALBUMIN 3.2*  AST 22  ALT 20  ALKPHOS 173*  BILITOT 0.6   Hematology Recent Labs  Lab 10/09/23 0620 10/09/23 1013 10/09/23 1558 10/10/23 0707 10/12/23 0348  WBC 12.6*  --   --  6.6 7.2  RBC 3.96  --   --  3.56* 3.84*  HGB 11.9*   < > 11.1* 10.8* 11.2*  HCT 36.2   < > 33.8* 32.9* 35.7*  MCV 91.4  --   --  92.4 93.0  MCH 30.1  --   --  30.3 29.2  MCHC 32.9  --   --  32.8 31.4  RDW 13.7  --   --  14.0 13.9  PLT 204  --   --   173 206   < > = values in this interval not displayed.   Cardiac EnzymesNo results for input(s): "TROPONINI" in the last 168 hours. No results for input(s): "TROPIPOC" in the last 168 hours.  BNP Recent Labs  Lab 10/08/23 1929  BNP 39.0    DDimer No results for input(s): "DDIMER" in the last 168 hours.  Radiology/Studies:  ECHOCARDIOGRAM COMPLETE Result Date: 10/12/2023    ECHOCARDIOGRAM REPORT   Patient Name:   [facility] Date of Exam: 10/12/2023 Medical Rec #:  [id number]      Height:       66.0 in Accession #:    [id number]     Weight:       210.8 lb Date of Birth:  1956/07/07       BSA:          2.045 m Patient Age:    68 years       BP:           129/82 mmHg Patient Gender: F              HR:           89 bpm. [facility] Procedure: 2D Echo, Cardiac Doppler and Color Doppler (Both Spectral and Color            Flow Doppler were utilized during procedure). Indications:    Bacteremia R78.81  History:        Patient has no prior history of Echocardiogram examinations.                 Risk Factors:Hypertension and Diabetes.  Sonographer:    Webb Laws Referring  Phys: [id number] [facility] IMPRESSIONS  1. No vegetations noted Cannot, given acoustic windows, rule out Would recommend TEE if clinically indicated.  2. Left ventricular ejection fraction, by estimation, is 55 to 60%. The left ventricle has normal function. The left ventricle has no regional wall motion abnormalities. There is moderate left ventricular hypertrophy. Left ventricular diastolic parameters are indeterminate.  3. Right ventricular systolic function is normal. The right ventricular size is normal.  4. The mitral valve is normal in structure. No evidence of mitral valve regurgitation.  5. The aortic valve is tricuspid. Aortic valve regurgitation is not visualized. Aortic valve sclerosis is present, with no evidence of aortic valve stenosis.  6. The inferior vena cava is dilated in size with <50% respiratory  variability, suggesting right atrial pressure of 15 mmHg. FINDINGS  Left Ventricle: Left ventricular ejection fraction, by estimation, is 55 to 60%. The left ventricle has normal function. The left ventricle has no regional wall motion abnormalities. The left ventricular internal cavity size was normal in size. There is  moderate left ventricular hypertrophy. Left ventricular diastolic parameters are indeterminate. Right Ventricle: The right ventricular size is normal. Right vetricular wall thickness was not assessed. Right ventricular systolic function is normal. Left Atrium: Left atrial size was normal in size. Right Atrium: Right atrial size was normal in size. Pericardium: There is no evidence of pericardial effusion. Mitral Valve: The mitral valve is normal in structure. No evidence of mitral valve regurgitation. Tricuspid Valve: The tricuspid valve is normal in structure. Tricuspid valve regurgitation is mild. Aortic Valve: The aortic valve is tricuspid. Aortic valve regurgitation is not visualized. Aortic valve sclerosis is present, with no evidence of aortic valve stenosis. Pulmonic Valve: The pulmonic valve was not well visualized. Pulmonic valve regurgitation is not visualized. No evidence of pulmonic stenosis. Aorta: The aortic root and ascending aorta are structurally normal, with no evidence of dilitation. Venous: The inferior vena cava is dilated in size with less than 50% respiratory variability, suggesting right atrial pressure of 15 mmHg. IAS/Shunts: No atrial level shunt detected by color flow Doppler.  LEFT VENTRICLE PLAX 2D LVIDd:         4.10 cm   Diastology LVIDs:         2.80 cm   LV e' medial:    5.87 cm/s LV PW:         0.90 cm   LV E/e' medial:  11.6 LV IVS:        1.50 cm   LV e' lateral:   8.81 cm/s LVOT diam:     1.70 cm   LV E/e' lateral: 7.7 LV SV:         60 LV SV Index:   29 LVOT Area:     2.27 cm  RIGHT VENTRICLE             IVC RV Basal diam:  3.40 cm     IVC diam: 2.50 cm RV S  prime:     15.70 cm/s TAPSE (M-mode): 2.1 cm LEFT ATRIUM             Index        RIGHT ATRIUM           Index LA diam:        4.10 cm 2.01 cm/m   RA Area:     15.30 cm LA Vol (A2C):   48.6 ml 23.77 ml/m  RA Volume:   39.90 ml  19.51 ml/m LA Vol (A4C):  37.7 ml 18.44 ml/m LA Biplane Vol: 43.7 ml 21.37 ml/m  AORTIC VALVE LVOT Vmax:   115.00 cm/s LVOT Vmean:  81.700 cm/s LVOT VTI:    0.263 m  AORTA Ao Root diam: 3.00 cm Ao Asc diam:  3.50 cm MITRAL VALVE               TRICUSPID VALVE MV Area (PHT): 5.13 cm    TR Peak grad:   18.7 mmHg MV Decel Time: 148 msec    TR Vmax:        216.00 cm/s MV E velocity: 67.90 cm/s MV A velocity: 99.00 cm/s  SHUNTS MV E/A ratio:  0.69        Systemic VTI:  0.26 m                            Systemic Diam: 1.70 cm Dietrich Pates MD Electronically signed by Dietrich Pates MD Signature Date/Time: 10/12/2023/5:27:16 PM    Final    DG Knee 1-2 Views Right Result Date: 10/12/2023 CLINICAL DATA:  Right knee pain.  Fall 2 days ago. EXAM: RIGHT KNEE - 1-2 VIEW COMPARISON:  None Available. FINDINGS: There is diffuse decreased bone mineralization. Moderate to severe patellofemoral joint space narrowing and peripheral osteophytosis. No joint effusion. The medial and lateral knee compartment joint spaces are maintained. Mild peripheral weight-bearing medial femoral condyle degenerative osteophytosis. No acute fracture or dislocation. 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 Electronically Signed   By: Neita Garnet M.D.   On: 10/12/2023 16:32    Assessment and Plan:   Bacteremia from staph lactobacillus, r/o infective endocarditis: TTE negative for acoustic windows, TEE was recommended.  ID also recommended TEE.  Risks and benefits of the procedure to ER explained to the patient.  Risk include, but not limited to, chest pain, infection, pneumonia, sore throat, esophageal rupture etc.  Patient comprehended these risks and agreed to proceed with the procedure.   For  questions or updates, please contact [facility] Please consult [email or web address] for contact info under Cardiology/STEMI.   Signed, Tedi Hughson Verne Spurr, MD

## 2023-10-13 NOTE — Plan of Care (Signed)
  Problem: Clinical Measurements: Goal: Diagnostic test results will improve Outcome: Progressing   Problem: Respiratory: Goal: Ability to maintain adequate ventilation will improve Outcome: Progressing   Problem: Health Behavior/Discharge Planning: Goal: Ability to manage health-related needs will improve Outcome: Progressing

## 2023-10-14 ENCOUNTER — Encounter (HOSPITAL_COMMUNITY): Payer: Self-pay | Admitting: Internal Medicine

## 2023-10-14 ENCOUNTER — Other Ambulatory Visit: Payer: Self-pay

## 2023-10-14 DIAGNOSIS — A4150 Gram-negative sepsis, unspecified: Secondary | ICD-10-CM | POA: Diagnosis not present

## 2023-10-14 DIAGNOSIS — N390 Urinary tract infection, site not specified: Secondary | ICD-10-CM | POA: Diagnosis not present

## 2023-10-14 LAB — CBC
HCT: 37.7 % (ref 36.0–46.0)
Hemoglobin: 11.8 g/dL — ABNORMAL LOW (ref 12.0–15.0)
MCH: 29.6 pg (ref 26.0–34.0)
MCHC: 31.3 g/dL (ref 30.0–36.0)
MCV: 94.7 fL (ref 80.0–100.0)
Platelets: 211 10*3/uL (ref 150–400)
RBC: 3.98 MIL/uL (ref 3.87–5.11)
RDW: 13.8 % (ref 11.5–15.5)
WBC: 7.6 10*3/uL (ref 4.0–10.5)
nRBC: 0 % (ref 0.0–0.2)

## 2023-10-14 LAB — BASIC METABOLIC PANEL
Anion gap: 9 (ref 5–15)
BUN: 11 mg/dL (ref 8–23)
CO2: 24 mmol/L (ref 22–32)
Calcium: 9.1 mg/dL (ref 8.9–10.3)
Chloride: 104 mmol/L (ref 98–111)
Creatinine, Ser: 0.65 mg/dL (ref 0.44–1.00)
GFR, Estimated: >60 mL/min (ref >60)
Glucose, Bld: 250 mg/dL — ABNORMAL HIGH (ref 70–99)
Potassium: 4 mmol/L (ref 3.5–5.1)
Sodium: 137 mmol/L (ref 135–145)

## 2023-10-14 LAB — GLUCOSE, CAPILLARY
Glucose-Capillary: 246 mg/dL — ABNORMAL HIGH (ref 70–99)
Glucose-Capillary: 222 mg/dL — ABNORMAL HIGH (ref 70–99)
Glucose-Capillary: 132 mg/dL — ABNORMAL HIGH (ref 70–99)

## 2023-10-14 MED ORDER — METOPROLOL TARTRATE 50 MG PO TABS: 50.0000 mg | ORAL_TABLET | Freq: Two times a day (BID) | ORAL | 2 refills | Status: DC

## 2023-10-14 MED ORDER — VANCOMYCIN HCL 125 MG PO CAPS: ORAL_CAPSULE | ORAL | 0 refills | Status: AC

## 2023-10-14 MED ORDER — SODIUM CHLORIDE 0.9% FLUSH
10.0000 mL | Freq: Two times a day (BID) | INTRAVENOUS | Status: DC
  Administered 2023-10-14: 10 mL

## 2023-10-14 MED ORDER — ACETAMINOPHEN 325 MG PO TABS: 650.0000 mg | ORAL_TABLET | Freq: Four times a day (QID) | ORAL | Status: DC | PRN

## 2023-10-14 MED ORDER — FLUCONAZOLE 150 MG PO TABS: 150.0000 mg | ORAL_TABLET | Freq: Once | ORAL | 1 refills | Status: AC

## 2023-10-14 MED ORDER — SODIUM CHLORIDE 0.9% FLUSH: 10.0000 mL | INTRAVENOUS | Status: DC | PRN

## 2023-10-14 MED ORDER — CEFAZOLIN IV (FOR PTA / DISCHARGE USE ONLY): 2.0000 g | Freq: Three times a day (TID) | INTRAVENOUS | 0 refills | Status: AC

## 2023-10-14 MED ORDER — ONDANSETRON HCL 4 MG PO TABS: 4.0000 mg | ORAL_TABLET | Freq: Four times a day (QID) | ORAL | 0 refills | Status: AC | PRN

## 2023-10-14 MED ORDER — HYDROCORTISONE ACETATE 25 MG RE SUPP: 25.0000 mg | Freq: Two times a day (BID) | RECTAL | 1 refills | Status: DC

## 2023-10-14 MED ORDER — CHLORHEXIDINE GLUCONATE CLOTH 2 % EX PADS
6.0000 | MEDICATED_PAD | Freq: Every day | CUTANEOUS | Status: DC
  Administered 2023-10-14: 6 via TOPICAL

## 2023-10-14 NOTE — Plan of Care (Signed)
 Discharging home with home infusion and PICC line for home antibiotics.   Problem: Fluid Volume: Goal: Hemodynamic stability will improve Outcome: Adequate for Discharge   Problem: Clinical Measurements: Goal: Diagnostic test results will improve Outcome: Adequate for Discharge Goal: Signs and symptoms of infection will decrease Outcome: Adequate for Discharge   Problem: Respiratory: Goal: Ability to maintain adequate ventilation will improve Outcome: Adequate for Discharge   Problem: Education: Goal: Knowledge of General Education information will improve Description: Including pain rating scale, medication(s)/side effects and non-pharmacologic comfort measures Outcome: Adequate for Discharge   Problem: Health Behavior/Discharge Planning: Goal: Ability to manage health-related needs will improve Outcome: Adequate for Discharge   Problem: Clinical Measurements: Goal: Ability to maintain clinical measurements within normal limits will improve Outcome: Adequate for Discharge Goal: Will remain free from infection Outcome: Adequate for Discharge Goal: Diagnostic test results will improve Outcome: Adequate for Discharge Goal: Respiratory complications will improve Outcome: Adequate for Discharge Goal: Cardiovascular complication will be avoided Outcome: Adequate for Discharge   Problem: Activity: Goal: Risk for activity intolerance will decrease Outcome: Adequate for Discharge   Problem: Nutrition: Goal: Adequate nutrition will be maintained Outcome: Adequate for Discharge   Problem: Coping: Goal: Level of anxiety will decrease Outcome: Adequate for Discharge   Problem: Elimination: Goal: Will not experience complications related to bowel motility Outcome: Adequate for Discharge Goal: Will not experience complications related to urinary retention Outcome: Adequate for Discharge   Problem: Pain Managment: Goal: General experience of comfort will improve and/or be  controlled Outcome: Adequate for Discharge   Problem: Safety: Goal: Ability to remain free from injury will improve Outcome: Adequate for Discharge   Problem: Skin Integrity: Goal: Risk for impaired skin integrity will decrease Outcome: Adequate for Discharge   Problem: Education: Goal: Ability to describe self-care measures that may prevent or decrease complications (Diabetes Survival Skills Education) will improve Outcome: Adequate for Discharge Goal: Individualized Educational Video(s) Outcome: Adequate for Discharge   Problem: Coping: Goal: Ability to adjust to condition or change in health will improve Outcome: Adequate for Discharge   Problem: Fluid Volume: Goal: Ability to maintain a balanced intake and output will improve Outcome: Adequate for Discharge   Problem: Health Behavior/Discharge Planning: Goal: Ability to identify and utilize available resources and services will improve Outcome: Adequate for Discharge Goal: Ability to manage health-related needs will improve Outcome: Adequate for Discharge   Problem: Metabolic: Goal: Ability to maintain appropriate glucose levels will improve Outcome: Adequate for Discharge   Problem: Nutritional: Goal: Maintenance of adequate nutrition will improve Outcome: Adequate for Discharge Goal: Progress toward achieving an optimal weight will improve Outcome: Adequate for Discharge   Problem: Skin Integrity: Goal: Risk for impaired skin integrity will decrease Outcome: Adequate for Discharge   Problem: Tissue Perfusion: Goal: Adequacy of tissue perfusion will improve Outcome: Adequate for Discharge

## 2023-10-14 NOTE — Progress Notes (Signed)
 PHARMACY CONSULT NOTE FOR:  OUTPATIENT  PARENTERAL ANTIBIOTIC THERAPY (OPAT)  Indication: Staph lugdunensis bacteremia Regimen: Cefazolin 2g IV every 8 hours End date: 10/24/23  IV antibiotic discharge orders are pended. To discharging provider:  please sign these orders via discharge navigator,  Select New Orders & click on the button choice - Manage This Unsigned Work.     Thank you for allowing pharmacy to be a part of this patient's care.  Georgina Pillion, PharmD, BCPS, BCIDP Infectious Diseases Clinical Pharmacist 10/14/2023 9:40 AM   **Pharmacist phone directory can now be found on [email or web address] ([email or web address]).  Listed under [facility].

## 2023-10-14 NOTE — TOC Initial Note (Signed)
 Transition of Care [facility]) - Initial/Assessment Note    Patient Details  Name: Amy Livingston MRN: [id number] Date of Birth: Mar 28, 1956  Transition of Care [facility]) CM/SW Contact:    Elliot Gault, LCSW Phone Number: 10/14/2023, 11:41 AM  Clinical Narrative:                  Per Md, pt will need IV anbx at dc. PICC line today with possible dc later today.  Spoke with pt to review dc planning. Pt prefers to return home with [facility] and IV anbx therapy at home.   CMS provider options for [facility] and Home infusion reviewed. Referred as requested. Pam at [facility] will schedule teaching with pt for later today. [facility] will follow up with pt at home for further care.  No other TOC needs identified at this time.   Expected Discharge Plan: Home w Home Health Services Barriers to Discharge: Continued Medical Work up   Patient Goals and CMS Choice Patient states their goals for this hospitalization and ongoing recovery are:: go home CMS Medicare.gov Compare Post Acute Care list provided to:: Patient Choice offered to / list presented to : Patient      Expected Discharge Plan and Services In-house Referral: Clinical Social Work   Post Acute Care Choice: Home Health Living arrangements for the past 2 months: Single Family Home Expected Discharge Date: 10/14/23                         HH Arranged: RN, IV Antibiotics HH Agency: [facility] Date HH Agency Contacted: 10/14/23   Representative spoke with at [facility] Agency: [facility]ie Neighbours  Prior Living Arrangements/Services Living arrangements for the past 2 months: Single Family Home Lives with:: Adult Children Patient language and need for interpreter reviewed:: Yes Do you feel safe going back to the place where you live?: Yes      Need for Family Participation in Patient Care: No (Comment) Care giver support system in place?: Yes (comment)   Criminal Activity/Legal Involvement Pertinent to Current  Situation/Hospitalization: No - Comment as needed  Activities of Daily Living   ADL Screening (condition at time of admission) Independently performs ADLs?: Yes (appropriate for developmental age) Is the patient deaf or have difficulty hearing?: No Does the patient have difficulty seeing, even when wearing glasses/contacts?: No Does the patient have difficulty concentrating, remembering, or making decisions?: No  Permission Sought/Granted Permission sought to share information with : Oceanographer granted to share information with : Yes, Verbal Permission Granted     Permission granted to share info w AGENCY: HH, Home Infusion        Emotional Assessment Appearance:: Appears stated age Attitude/Demeanor/Rapport: Engaged Affect (typically observed): Pleasant Orientation: : Oriented to Self, Oriented to Place, Oriented to  Time, Oriented to Situation Alcohol / Substance Use: Not Applicable Psych Involvement: No (comment)  Admission diagnosis:  Urinary tract infection without hematuria, site unspecified [N39.0] Syncope, unspecified syncope type [R55] Sepsis due to gram-negative UTI (HCC) [A41.50, N39.0] Sepsis, due to unspecified organism, unspecified whether acute organ dysfunction present [facility]) [A41.9] Patient Active Problem List   Diagnosis Date Noted   Bacteremia due to other bacteria 10/13/2023   Paroxysmal atrial fibrillation (HCC) 10/09/2023   Anxiety and depression 10/09/2023   Syncope and collapse 10/09/2023   BRBPR (bright red blood per rectum) 10/09/2023   Enteritis due to Clostridium difficile 10/09/2023   Diarrhea of infectious origin 10/09/2023   Sepsis  due to gram-negative UTI (HCC) 10/08/2023   Hypokalemia 10/08/2023   Hyponatremia 10/08/2023   Uncontrolled type 2 diabetes mellitus with hyperglycemia, with long-term current use of insulin (HCC) 10/08/2023   Essential hypertension 10/08/2023   Peripheral neuropathy 10/08/2023    Overactive bladder 06/17/2023   Generalized anxiety disorder 06/17/2023   Type 2 diabetes mellitus with diabetic neuropathy, unspecified (HCC) 06/17/2023   Hypertension 06/01/2023   Urinary frequency 06/01/2023   Bowel incontinence 06/01/2023   Generalized weakness 06/01/2023   Encounter for immunization 06/01/2023   Diabetes 1.5, 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   PAF (paroxysmal atrial fibrillation) (HCC) 08/14/2020   Dyslipidemia 07/01/2020   Type 2 diabetes mellitus with hyperglycemia (HCC) 07/01/2020   Body mass index (BMI) 40.0-44.9, adult (HCC) 11/07/2019   Elevated blood-pressure reading, without diagnosis of hypertension 11/07/2019   Spondylolisthesis, lumbar region 07/18/2018   Cervical spondylosis with myelopathy 01/13/2016   Cervical disc disorder with myelopathy 12/04/2015   Neck pain 12/04/2015   PCP:  Gilmore Laroche, FNP Pharmacy:   [facility] [address], [place] - [address] [place] [address] Phone: [phone] Fax: [phone]  [facility] [phone] - [place], [place] - [address] AT [facility] [address] [place] [address] Phone: [phone] Fax: [phone]     Social Drivers of Health (SDOH) Social History: SDOH Screenings   Food Insecurity: Food Insecurity Present (10/12/2023)  Housing: Low Risk  (10/12/2023)  Transportation Needs: No Transportation Needs (10/12/2023)  Utilities: Not At Risk (10/09/2023)  Alcohol Screen: Low Risk  (09/28/2023)  Depression (PHQ2-9): Low Risk  (09/28/2023)  Financial Resource Strain: Low Risk  (10/12/2023)  Physical Activity: Inactive (10/12/2023)  Social Connections: Socially Isolated (10/12/2023)  Stress: Stress Concern Present (10/12/2023)  Tobacco Use: Low Risk  (10/13/2023)  Health Literacy: Adequate Health Literacy (09/28/2023)   SDOH Interventions: Food Insecurity Interventions: Inpatient TOC   Readmission Risk Interventions     No data  to display

## 2023-10-14 NOTE — Inpatient Diabetes Management (Addendum)
 Inpatient Diabetes Program Recommendations  AACE/ADA: New Consensus Statement on Inpatient Glycemic Control (2015)  Target Ranges:  Prepandial:   less than 140 mg/dL      Peak postprandial:   less than 180 mg/dL (1-2 hours)      Critically ill patients:  140 - 180 mg/dL    Latest Reference Range & Units 06/01/23 16:12 10/09/23 15:58  Hemoglobin A1C 4.8 - 5.6 % >15.5 (H) 12.4 (H)  309 mg/dl  (H): Data is abnormally high  Latest Reference Range & Units 10/13/23 07:52 10/13/23 12:00 10/13/23 16:40 10/13/23 20:39  Glucose-Capillary 70 - 99 mg/dL 161 (H)  3 units Novolog  Pt NPO  222 (H)  5 units Novolog  Pt NPO  197 (H)  6 units Novolog  240 (H)  2 units Novolog  20 units Semglee  (H): Data is abnormally high  Latest Reference Range & Units 10/14/23 07:50  Glucose-Capillary 70 - 99 mg/dL 096 (H)  8 units Novolog   (H): Data is abnormally high   Home DM Meds: Novolin 70/30 insulin 40 units BID      Mounjaro 5 mg weekly  Current Orders: Semglee 20 units at bedtime      Novolog Moderate Correction Scale/ SSI (0-15 units) TID AC + HS      Novolog 3 units TID with meals   MD- Please consider:  1. Increase Semglee to 25 units at bedtime  2. Increase Novolog Meal Coverage to 6 units TID with meals   Last Patient Outreach Diabetes Visit [facility] Population health dept) with Graciella Belton, PharmD was 09/28/2023 Instructions given:  -Continue Novolin 70/30 pen --40 units twice daily -Increase Mounjaro to 5 mg weekly (next shot Tuesday--double up to equal 5mg ) -Needs Libre 3 PLUS -Hasn't gotten Lantus will hold and continue 70/30 for now    Addendum 11:45am--Called pt by phone to review CBGs, A1c.  This Diabetes Coordinator is working from [facility] in [place] today.  I congratulated pt on bring her A1c down 3 points from a 15.5% to a 12.4%.  Reviewed goal A1c and goal CBGs for home.  Pt confirmed she is taking the Novolin 70/30 Insulin 40 units BID.  She also  confirmed she is taking the [facility] but she has not received her new Rx for the higher dose yet (was taking 2.5 mg Qweek and was asked to increase her dose to 5 mg Qweek).  Pt to call PCP or [facility] Population health dept to check on the status of the new Rx.  Pt did confirm she was given 4 samples of the Freestyle Libre 3 plus glucose sensors.  She plans to use her phone with the app.  Pt stated she has not had time to download the app yet and start the 1st sensor.  I briefly reviewed how to download the app and apply the 1st sensor and referred pt to online resources she can use at home if she needs any more help downloading the app or starting the 1st sensor.  Pt appreciative of the call and did not have any further questions for me at this time.      --Will follow patient during hospitalization--  Ambrose Finland RN, MSN, CDCES Diabetes Coordinator Inpatient Glycemic Control Team Team Pager: [phone] (8a-5p)

## 2023-10-14 NOTE — Progress Notes (Signed)
 ID brief note ( remote fu, not seen in person, video cart not available)   Remains afebrile Diarrhea improving per note No concerns for metastatic site of infection noted in chart  including neck and back  CBC and BMP unremarkable except hemoglobin 11.8, glucose 250  Repeat blood cultures 3/3 no growth in 4 days 3/6 TEE no evidence of vegetation or endocarditis  A/p  # Uncomplicated staph lugdunensis bacteremia  # C. difficile diarrhea  Plan -Place midline if available instead of PICC -Plan for 2 weeks of cefazolin from date of negative blood cultures on 3/3. OPAT as below -CBC and BMP on antibiotics -Complete 10 days of p.o. vancomycin 125 mg p.o. 4 times daily ( EOT 3/11) followed by prophylactic dosing 125 mg po daily for 7 days after completion of antibiotics ( EOT 3/17)  OPAT  Diagnosis: Staph lugdunensis bacteremia  Culture Result: As above  Allergies  Allergen Reactions   Elemental Sulfur Itching and Rash    Yeast infections   Metformin And Related Diarrhea    GI distress    OPAT Orders Discharge antibiotics to be given via PICC line Discharge antibiotics: Cefazolin 2 g IV every 8 Per pharmacy protocol  Duration: 2 weeks  End Date: 10/24/23  [facility] Care Per Protocol:  Home health RN for IV administration and teaching; PICC line care and labs.    Labs weekly while on IV antibiotics: X__ CBC with differential __ BMP X__ CMP __ CRP __ ESR __ Vancomycin trough __ CK  X__ Please pull PIC at completion of IV antibiotics __ Please leave PIC in place until doctor has seen patient or been notified  Fax weekly labs to [phone]  Clinic Follow Up Appt: 3/20 @9 : 45 am  ID will so, please recall back with questions or concerns  D/w primary team and ID pharm D   Odette Fraction, MD Infectious Disease Physician [facility] [address] [place], [place] [address] Phone: [phone]  Fax:  [phone]

## 2023-10-14 NOTE — Consult Note (Signed)
 [facility] Hospital Liaison Consult Note   10/14/2023  Etha Stambaugh [birth year] [id number]  Primary Care Provider:  Gilmore Laroche, MD ([facility])  Patient is currently active with Care Management for chronic disease management services.  Patient has been engaged by a community RNCM.  Our community based plan of care has focused on disease management and community resource support.   Patient will receive a post hospital call and will be evaluated for assessments and disease process education.   Of note, Care Management services does not replace or interfere with any services that are needed or arranged by inpatient [facility] care management team.   For additional questions or referrals please contact:  Elliot Cousin, RN, BSN Hospital Liaison [place]   [facility], Population Health Office Hours MTWF  8:00 am-6:00 pm Direct Dial: [phone] mobile [email or web address]

## 2023-10-14 NOTE — Discharge Summary (Addendum)
 Amy Livingston, is a 68 y.o. female  DOB Jun 25, 1956  MRN [id number].  Admission date:  10/08/2023  Admitting Physician  Hannah Beat, MD  Discharge Date:  10/14/2023   Primary MD  Gilmore Laroche, FNP  Recommendations for primary care physician for things to follow:  1)Home health RN to help with home infusion of antibiotic -cefazolin 2 g IV every 8 hours via PICC Line for Staphylococcus Lugdunensis bacteremia  First Dose: Yes Last Day of Therapy:  10/24/23  (2 weeks of antibiotic therapy) Labs - Once weekly:  CBC/D and BMP, Labs - Once weekly: ESR and CRP Method of administration: IV Push  2)Avoid ibuprofen/Advil/Aleve/Motrin/Goody Powders/Naproxen/BC powders/Meloxicam/Diclofenac/Indomethacin and other Nonsteroidal anti-inflammatory medications as these will make you more likely to bleed and can cause stomach ulcers, can also cause Kidney problems.  3)Picc Line Care Per Protocol- Home health RN for IV administration and teaching; PICC line care and labs.   -Please pull PIC at completion of IV antibiotics in 2 weeks time Fax weekly labs to [phone]  Clinic Follow Up Appt: 10/27/23 @9 : 45 am at  [facility] for Infectious Disease [address] [place], [place] [address] Phone: [phone]  Fax: [phone]  Admission Diagnosis  Urinary tract infection without hematuria, site unspecified [N39.0] Syncope, unspecified syncope type [R55] Sepsis due to gram-negative UTI (HCC) [A41.50, N39.0] Sepsis, due to unspecified organism, unspecified whether acute organ dysfunction present [facility]) [A41.9]   Discharge Diagnosis  Urinary tract infection without hematuria, site unspecified [N39.0] Syncope, unspecified syncope type [R55] Sepsis due to gram-negative UTI (HCC) [A41.50, N39.0] Sepsis, due to unspecified organism, unspecified whether acute organ dysfunction present (HCC) [A41.9]     Principal Problem:   Sepsis due to gram-negative UTI (HCC) Active Problems:   Hypokalemia   Uncontrolled type 2 diabetes mellitus with hyperglycemia, with long-term current use of insulin (HCC)   Hyponatremia   Syncope and collapse   Dyslipidemia   Essential hypertension   Peripheral neuropathy   Paroxysmal atrial fibrillation (HCC)   Anxiety and depression   BRBPR (bright red blood per rectum)   Enteritis due to Clostridium difficile   Diarrhea of infectious origin   Bacteremia due to other bacteria      Past Medical History:  Diagnosis Date   Anxiety    Atrial fibrillation (HCC)    Diabetes mellitus without complication (HCC)    Dysrhythmia    GERD (gastroesophageal reflux disease)    Hypertension    PONV (postoperative nausea and vomiting)    Shortness of breath dyspnea     Past Surgical History:  Procedure Laterality Date   ABDOMINAL HYSTERECTOMY     ANTERIOR CERVICAL DECOMP/DISCECTOMY FUSION N/A 01/13/2016   Procedure: C5-6 ANTERIOR CERVICAL DECOMPRESSION/DISKECTOMY/FUSION;  Surgeon: Barnett Abu, MD;  Location: [facility];  Service: Neurosurgery;  Laterality: N/A;  C5-6 ANTERIOR CERVICAL DECOMPRESSION/DISKECTOMY/FUSION   BACK SURGERY     x 5   BIOPSY  07/25/2023   Procedure: BIOPSY;  Surgeon: Lanelle Bal, DO;  Location: [facility]  ENDO SUITE;  Service: Endoscopy;;   CHOLECYSTECTOMY     COLONOSCOPY WITH PROPOFOL N/A 07/25/2023   Procedure: COLONOSCOPY WITH PROPOFOL;  Surgeon: Lanelle Bal, DO;  Location: [facility] END[facility];  Service: Endoscopy;  Laterality: N/A;  10:45 am, asa 3   ESOPHAGEAL BRUSHING  07/25/2023   Procedure: ESOPHAGEAL BRUSHING;  Surgeon: Lanelle Bal, DO;  Location: [facility];  Service: Endoscopy;;   ESOPHAGOGASTRODUODENOSCOPY (EGD) WITH PROPOFOL N/A 07/25/2023   Procedure: ESOPHAGOGASTRODUODENOSCOPY (EGD) WITH PROPOFOL;  Surgeon: Lanelle Bal, DO;  Location: [facility];  Service: Endoscopy;  Laterality: N/A;   FOOT SURGERY      KNEE ARTHROSCOPY     left   KNEE SURGERY Right    POLYPECTOMY  07/25/2023   Procedure: POLYPECTOMY INTESTINAL;  Surgeon: Lanelle Bal, DO;  Location: [facility];  Service: Endoscopy;;   TEE WITHOUT CARDIOVERSION N/A 10/13/2023   Procedure: ECHOCARDIOGRAM, TRANSESOPHAGEAL;  Surgeon: Marjo Bicker, MD;  Location: [facility] ORS;  Service: Cardiovascular;  Laterality: N/A;   TUBAL LIGATION       HPI  from the history and physical done on the day of admission:   [name] is a 68 y.o. Caucasian female with medical history significant for anxiety, atrial fibrillation, type diabetes mellitus, dysrhythmia, GERD, and essential hypertension, who presented to the emergency room with acute onset of generalized weakness tonight and an episode of syncope when she was on the toilet this evening required increased assistance from her daughter to even stand up.  Her daughter witnessed to have her having a syncopal episode at the time that lasted 10 to 20 seconds.  There was no head injury or other injuries.  She had mild twitching during the episode.  She had a quick return of mental status baseline afterwards.  She denied any history of seizures.  She was noted to be mildly hypoxic on arrival to the hospital.  She was complaining of chronic back pain.  No paresthesias or focal muscle weakness.  No chest pain or palpitations.  No cough or wheezing or dyspnea.  Per her daughter she has been having visual hesitations intermittently since January.  She is also been having episodes of syncope when she stands up from sitting or lying down position.   ED Course: When the she came to the ER, temperature was 97.5 and heart rate was 118 with otherwise normal vital signs.  Labs revealed a pH of 7.34 and HCO3 of 27.4.  CMP revealed mild hyponatremia and hypokalemia, hyperglycemia of 436 and CO2 of 21 with alk phos 173 and albumin 3.2.  Lipase was 35 and ammonia level 16.  High sensitive troponin I was 3 twice and  BNP 39.  Lactic acid was 3.9 and later 2.9.  CBC was within normal.  Respiratory panel came back negative.  UA was positive for UTI.  Urine drug screen was negative.  Alcohol level was less than 10.  Blood culture was drawn as well as urine culture. EKG as reviewed by me :  EKG showed sinus tachycardia with a rate of 117 with borderline prolonged QT interval with QTc of 494 mL. Imaging: None Acid CT scan revealed no acute intracranial abnormalities.  Portable chest x-ray showed aortic atherosclerosis with no acute cardiopulmonary disease.   The patient was given IV cefepime, Flagyl and 2.5 L of IV lactated ringer bolus followed by 150 mL/h as well as 1 p.o. fluconazole.  She was placed on IV Levophed briefly that was later tapered  off with improved blood pressure.  The patient will be admitted to a medical telemetry bed for further evaluation and management.   Hospital Course:   Assessment and Plan: 1)Sepsis due to Staph lugdunensis bacteremia  ? source -Case previously discussed with ID Merceda Elks, MD and Dr. Elinor Parkinson) -Infectious disease input appreciated -Blood cultures from 10/08/2023 with staph as above -Repeat blood cultures from 10/10/2023 NGTD ---Transthoracic echo from 10/12/2023 without vegetations -TEE from 10/13/23 without vegetations - PICC line placement on 10/14/2023 as blood cultures remained negative and  discharge on IV antibiotics -Antibiotics:  Vancomycin 3/3-c Cefepime 3/1, ceftriaxone 3/1-c Metronidazole 3/1-c PO Vancomycin 3/2-  -Cefazolin started on 10/11/2023 after discontinuing IV Vanco -Continue IV cefazolin at home last dose 10/24/2023     2)C.Diff  infection/bright red blood per rectum -Patient has lost over 100 pounds over the last couple of years due to ongoing diarrhea -Patient had EGD and colonoscopy on 07/25/2023 without significant findings except for gastritis, hemorrhoids, diverticulosis and polyps----colon biopsies with pathology showing Colonic mucosa with no  significant diagnostic alteration.  No evidence of lymphocytic colitis or collagenous colitis. No evidence of activity, chronicity, granuloma, dysplasia or  malignancy.  Continue prolonged oral vancomycin---as ordered in view of IV antibiotic use -Maintain adequate hydration -Continue holding on further endoscopic evaluation at the moment -Bleeding episode most likely driven by internal hemorrhoids or diverticulosis--okay to use Anusol HC -Frequently and volume of stools has improved significantly on oral vancomycin -GI input appreciated   3)Uncontrolled type 2 diabetes mellitus with hyperglycemia, with long-term current use of insulin (HCC) =-Resume PTA diabetic medications --A1c is 12.4 reflecting uncontrolled DM with hyperglycemia PTA -Follow-up with PCP for further adjustments   4)Hypokalemia - Magnesium within normal limits -Potassium has normalized with replacement   5)Syncope and collapse - With concerns for sepsis physiology and hypotension -BP stabilized with resolution of diarrhea and antibiotic therapy -Resume PTA BP meds   6)Hyponatremia - In the setting of GI losses and dehydration -Sodium normalized with hydration   7)Anxiety and depression - No suicidal ideation hallucination -Continue BuSpar and Zoloft   8)Paroxysmal atrial fibrillation (HCC) -Continue metoprolol -Eliquis restarted   Peripheral neuropathy - Continue treatment with Neurontin.   Hypothyroidism/dyslipidemia -Continue Synthroid.   -Continue statin.   Discharge Condition: stable  Follow UP   Follow-up Information     Odette Fraction, MD. Go on 10/27/2023.   Specialty: Infectious Diseases Contact information: [address] [place] [address] [phone]                  Consults obtained -GI and infectious disease  Diet and Activity recommendation:  As advised  Discharge Instructions    Discharge Instructions     [facility] pharmacist to  adjust dose for Vancomycin, Aminoglycosides and other anti-infective therapies as requested by physician.   Complete by: As directed    [facility] to provide Cath Flo 2mg    Complete by: As directed    Administer for PICC line occlusion and as ordered by physician for other access device issues.   Anaphylaxis Kit: Provided to treat any anaphylactic reaction to the medication being provided to the patient if First Dose or when requested by physician   Complete by: As directed    Epinephrine 1mg /ml vial / amp: Administer 0.3mg  (0.48ml) subcutaneously once for moderate to severe anaphylaxis, nurse to call physician and pharmacy when reaction occurs and call 911 if needed for immediate care   Diphenhydramine 50mg /ml IV vial:  Administer 25-50mg  IV/IM PRN for first dose reaction, rash, itching, mild reaction, nurse to call physician and pharmacy when reaction occurs   Sodium Chloride 0.9% NS IV: Administer if needed for hypovolemic blood pressure drop or as ordered by physician after call to physician with anaphylactic reaction   Call MD for:  difficulty breathing, headache or visual disturbances   Complete by: As directed    Call MD for:  persistant dizziness or light-headedness   Complete by: As directed    Call MD for:  persistant nausea and vomiting   Complete by: As directed    Call MD for:  temperature >100.4   Complete by: As directed    Change dressing on IV access line weekly and PRN   Complete by: As directed    Diet - low sodium heart healthy   Complete by: As directed    Diet Carb Modified   Complete by: As directed    Discharge instructions   Complete by: As directed    1)Home health RN to help with home infusion of antibiotic -cefazolin 2 g IV every 8 hours via PICC Line for Staphylococcus Lugdunensis bacteremia  First Dose: Yes Last Day of Therapy:  10/24/23  (2 weeks of antibiotic therapy) Labs - Once weekly:  CBC/D and BMP, Labs - Once weekly: ESR and  CRP Method of administration: IV Push  2)Avoid ibuprofen/Advil/Aleve/Motrin/Goody Powders/Naproxen/BC powders/Meloxicam/Diclofenac/Indomethacin and other Nonsteroidal anti-inflammatory medications as these will make you more likely to bleed and can cause stomach ulcers, can also cause Kidney problems.  3)Picc Line Care Per Protocol- Home health RN for IV administration and teaching; PICC line care and labs.   -Please pull PIC at completion of IV antibiotics in 2 weeks time Fax weekly labs to [phone]  Clinic Follow Up Appt: 10/27/23 @9 : 45 am at  [facility] for Infectious Disease [address] [place], [place] [address] Phone: [phone]  Fax: [phone]   Flush IV access with Sodium Chloride 0.9% and Heparin 10 units/ml or 100 units/ml   Complete by: As directed    Home infusion instructions - [facility]   Complete by: As directed    Instructions: Flush IV access with Sodium Chloride 0.9% and Heparin 10units/ml or 100units/ml   Change dressing on IV access line: Weekly and PRN   Instructions Cath Flo 2mg : Administer for PICC Line occlusion and as ordered by physician for other access device   [facility] pharmacist to adjust dose for: Vancomycin, Aminoglycosides and other anti-infective therapies as requested by physician   Increase activity slowly   Complete by: As directed    Leave dressing on - Keep it clean, dry, and intact until clinic visit   Complete by: As directed    Method of administration may be changed at the discretion of home infusion pharmacist based upon assessment of the patient and/or caregiver's ability to self-administer the medication ordered   Complete by: As directed         Discharge Medications     Allergies as of 10/14/2023       Reactions   Elemental Sulfur Itching, Rash   Yeast infections   Metformin And Related Diarrhea   GI distress        Medication List     STOP taking  these medications    omeprazole 40 MG capsule Commonly known as: PRILOSEC       TAKE these medications    acetaminophen 325 MG  tablet Commonly known as: TYLENOL Take 2 tablets (650 mg total) by mouth every 6 (six) hours as needed for mild pain (pain score 1-3), headache or fever (or Fever >/= 101).   atorvastatin 80 MG tablet Commonly known as: LIPITOR Take 1 tablet (80 mg total) by mouth daily. What changed: when to take this   Blood Glucose Monitoring Suppl Devi 1 each by Does not apply route in the morning, at noon, and at bedtime. May substitute to any manufacturer covered by patient's insurance.   busPIRone 10 MG tablet Commonly known as: BUSPAR Take 1 tablet (10 mg total) by mouth 2 (two) times daily.   ceFAZolin IVPB Commonly known as: ANCEF Inject 2 g into the vein every 8 (eight) hours for 10 days. Indication:  Staph lug bacteremia First Dose: Yes Last Day of Therapy:  10/24/23 Labs - Once weekly:  CBC/D and BMP, Labs - Once weekly: ESR and CRP Method of administration: IV Push Method of administration may be changed at the discretion of home infusion pharmacist based upon assessment of the patient and/or caregiver's ability to self-administer the medication ordered.   cyclobenzaprine 10 MG tablet Commonly known as: FLEXERIL Take 10 mg by mouth every 12 (twelve) hours.   Eliquis 5 MG Tabs tablet Generic drug: apixaban Take 5 mg by mouth 2 (two) times daily.   fluconazole 150 MG tablet Commonly known as: Diflucan Take 1 tablet (150 mg total) by mouth once for 1 dose. For yeast infection   gabapentin 300 MG capsule Commonly known as: NEURONTIN Take 1 capsule (300 mg total) by mouth 3 (three) times daily.   hydrocortisone 25 MG suppository Commonly known as: ANUSOL-HC Place 1 suppository (25 mg total) rectally every 12 (twelve) hours.   lisinopril 5 MG tablet Commonly known as: ZESTRIL Take 1 tablet (5 mg total) by mouth daily.   meclizine 25 MG  tablet Commonly known as: ANTIVERT Take 25 mg by mouth daily as needed for dizziness.   metoprolol tartrate 50 MG tablet Commonly known as: LOPRESSOR Take 1 tablet (50 mg total) by mouth 2 (two) times daily.   mirabegron ER 25 MG Tb24 tablet Commonly known as: Myrbetriq Take 1 tablet (25 mg total) by mouth daily. What changed: when to take this   NOVOLIN 70/30 FLEXPEN [place] Inject 40 Units into the skin 2 (two) times daily with a meal.   ondansetron 4 MG tablet Commonly known as: ZOFRAN Take 1 tablet (4 mg total) by mouth every 6 (six) hours as needed for nausea.   oxyCODONE-acetaminophen 10-325 MG tablet Commonly known as: PERCOCET Take 1 tablet by mouth every 8 (eight) hours as needed for pain.   Restasis 0.05 % ophthalmic emulsion Generic drug: cycloSPORINE Place 1 drop into both eyes 2 (two) times daily as needed (dry eye).   sertraline 50 MG tablet Commonly known as: ZOLOFT Take 1 tablet (50 mg total) by mouth daily. What changed: when to take this   tirzepatide 5 MG/0.5ML Pen Commonly known as: MOUNJARO Inject 5 mg into the skin once a week. What changed: when to take this   vancomycin 125 MG capsule Commonly known as: VANCOCIN Take 1 capsule (125 mg total) by mouth 4 (four) times daily for 5 days, THEN 1 capsule (125 mg total) daily for 13 days. Start taking on: October 14, 2023   Vitamin D-3 25 MCG (1000 UT) Caps Take 1,000 Units by mouth 2 (two) times daily at 10 am and 4 pm.  Discharge Care Instructions  (From admission, onward)           Start     Ordered   10/14/23 0000  Change dressing on IV access line weekly and PRN  (Home infusion instructions - [facility] )        10/14/23 1416   10/14/23 0000  Leave dressing on - Keep it clean, dry, and intact until clinic visit        10/14/23 1416            Major procedures and Radiology Reports - PLEASE review detailed and final reports for all details, in brief -   [place]  EKG SITE RITE Result Date: 10/14/2023 If Site Rite image not attached, placement could not be confirmed due to current cardiac rhythm.  ECHO TEE Result Date: 10/13/2023    TRANSESOPHOGEAL ECHO REPORT   Patient Name:   [facility] Date of Exam: 10/13/2023 Medical Rec #:  [id number]      Height:       66.0 in Accession #:    [id number]     Weight:       210.8 lb Date of Birth:  [birth year]       BSA:          2.045 m Patient Age:    68 years       BP:           123/57 mmHg Patient Gender: F              HR:           89 bpm. [facility] Procedure: Transesophageal Echo, Cardiac Doppler and Color Doppler (Both            Spectral and Color Flow Doppler were utilized during procedure). Indications:    Bacteremia R78.81  History:        Patient has prior history of Echocardiogram examinations, most                 recent 10/12/2023. Arrythmias:Atrial Fibrillation,                 Signs/Symptoms:Syncope; Risk Factors:Hypertension, Diabetes and                 Dyslipidemia.  Sonographer:    Celesta Gentile RCS Referring Phys: [id number] Ellsworth Lennox PROCEDURE: The transesophogeal probe was passed without difficulty through the esophogus of the patient. Imaged were obtained with the patient in a left lateral decubitus position. Sedation performed by different physician. Image quality was good. The patient's vital signs; including heart rate, blood pressure, and oxygen saturation; remained stable throughout the procedure. Supplementary images were obtained from transthoracic windows as indicated to answer the clinical question. The patient developed no complications during the procedure.  IMPRESSIONS  1. Left ventricular ejection fraction, by estimation, is 55 to 60%. The left ventricle has normal function. The left ventricle has no regional wall motion abnormalities.  2. Right ventricular systolic function is normal. The right ventricular size is normal.  3. No left atrial/left atrial appendage thrombus was  detected. The LAA emptying velocity was 65 cm/s.  4. The mitral valve is normal in structure. Trivial mitral valve regurgitation. No evidence of mitral stenosis.  5. The aortic valve is tricuspid. Aortic valve regurgitation is not visualized. No aortic stenosis is present.  6. Not performed and demonstrates Not performed. Conclusion(s)/Recommendation(s): No evidence of vegetation/infective endocarditis on this transesophageael echocardiogram. FINDINGS  Left Ventricle: Left  ventricular ejection fraction, by estimation, is 55 to 60%. The left ventricle has normal function. The left ventricle has no regional wall motion abnormalities. Strain was performed and the global longitudinal strain is indeterminate. The left ventricular internal cavity size was normal in size. There is no left ventricular hypertrophy. Right Ventricle: The right ventricular size is normal. No increase in right ventricular wall thickness. Right ventricular systolic function is normal. Left Atrium: Left atrial size was normal in size. No left atrial/left atrial appendage thrombus was detected. The LAA emptying velocity was 65 cm/s. Right Atrium: Right atrial size was normal in size. Pericardium: There is no evidence of pericardial effusion. Mitral Valve: The mitral valve is normal in structure. Trivial mitral valve regurgitation. No evidence of mitral valve stenosis. Tricuspid Valve: The tricuspid valve is normal in structure. Tricuspid valve regurgitation is trivial. No evidence of tricuspid stenosis. Aortic Valve: The aortic valve is tricuspid. Aortic valve regurgitation is not visualized. No aortic stenosis is present. Pulmonic Valve: The pulmonic valve was normal in structure. Pulmonic valve regurgitation is not visualized. No evidence of pulmonic stenosis. Aorta: Aortic root could not be assessed. Venous: The inferior vena cava was not well visualized. IAS/Shunts: No atrial level shunt detected by color flow Doppler. Additional Comments: 3D  was performed not requiring image post processing on an independent workstation and was indeterminate. Spectral Doppler performed. Vishnu Priya Mallipeddi Electronically signed by Winfield Rast Mallipeddi Signature Date/Time: 10/13/2023/3:45:26 PM    Final    ECHOCARDIOGRAM COMPLETE Result Date: 10/12/2023    ECHOCARDIOGRAM REPORT   Patient Name:   [facility] Date of Exam: 10/12/2023 Medical Rec #:  [id number]      Height:       66.0 in Accession #:    [id number]     Weight:       210.8 lb Date of Birth:  October 09, 1955       BSA:          2.045 m Patient Age:    68 years       BP:           129/82 mmHg Patient Gender: F              HR:           89 bpm. [facility] Procedure: 2D Echo, Cardiac Doppler and Color Doppler (Both Spectral and Color            Flow Doppler were utilized during procedure). Indications:    Bacteremia R78.81  History:        Patient has no prior history of Echocardiogram examinations.                 Risk Factors:Hypertension and Diabetes.  Sonographer:    Webb Laws Referring Phys: [id number] SABINA MANANDHAR IMPRESSIONS  1. No vegetations noted Cannot, given acoustic windows, rule out Would recommend TEE if clinically indicated.  2. Left ventricular ejection fraction, by estimation, is 55 to 60%. The left ventricle has normal function. The left ventricle has no regional wall motion abnormalities. There is moderate left ventricular hypertrophy. Left ventricular diastolic parameters are indeterminate.  3. Right ventricular systolic function is normal. The right ventricular size is normal.  4. The mitral valve is normal in structure. No evidence of mitral valve regurgitation.  5. The aortic valve is tricuspid. Aortic valve regurgitation is not visualized. Aortic valve sclerosis is present, with no evidence of aortic valve stenosis.  6. The inferior vena cava is  dilated in size with <50% respiratory variability, suggesting right atrial pressure of 15 mmHg. FINDINGS  Left  Ventricle: Left ventricular ejection fraction, by estimation, is 55 to 60%. The left ventricle has normal function. The left ventricle has no regional wall motion abnormalities. The left ventricular internal cavity size was normal in size. There is  moderate left ventricular hypertrophy. Left ventricular diastolic parameters are indeterminate. Right Ventricle: The right ventricular size is normal. Right vetricular wall thickness was not assessed. Right ventricular systolic function is normal. Left Atrium: Left atrial size was normal in size. Right Atrium: Right atrial size was normal in size. Pericardium: There is no evidence of pericardial effusion. Mitral Valve: The mitral valve is normal in structure. No evidence of mitral valve regurgitation. Tricuspid Valve: The tricuspid valve is normal in structure. Tricuspid valve regurgitation is mild. Aortic Valve: The aortic valve is tricuspid. Aortic valve regurgitation is not visualized. Aortic valve sclerosis is present, with no evidence of aortic valve stenosis. Pulmonic Valve: The pulmonic valve was not well visualized. Pulmonic valve regurgitation is not visualized. No evidence of pulmonic stenosis. Aorta: The aortic root and ascending aorta are structurally normal, with no evidence of dilitation. Venous: The inferior vena cava is dilated in size with less than 50% respiratory variability, suggesting right atrial pressure of 15 mmHg. IAS/Shunts: No atrial level shunt detected by color flow Doppler.  LEFT VENTRICLE PLAX 2D LVIDd:         4.10 cm   Diastology LVIDs:         2.80 cm   LV e' medial:    5.87 cm/s LV PW:         0.90 cm   LV E/e' medial:  11.6 LV IVS:        1.50 cm   LV e' lateral:   8.81 cm/s LVOT diam:     1.70 cm   LV E/e' lateral: 7.7 LV SV:         60 LV SV Index:   29 LVOT Area:     2.27 cm  RIGHT VENTRICLE             IVC RV Basal diam:  3.40 cm     IVC diam: 2.50 cm RV S prime:     15.70 cm/s TAPSE (M-mode): 2.1 cm LEFT ATRIUM             Index         RIGHT ATRIUM           Index LA diam:        4.10 cm 2.01 cm/m   RA Area:     15.30 cm LA Vol (A2C):   48.6 ml 23.77 ml/m  RA Volume:   39.90 ml  19.51 ml/m LA Vol (A4C):   37.7 ml 18.44 ml/m LA Biplane Vol: 43.7 ml 21.37 ml/m  AORTIC VALVE LVOT Vmax:   115.00 cm/s LVOT Vmean:  81.700 cm/s LVOT VTI:    0.263 m  AORTA Ao Root diam: 3.00 cm Ao Asc diam:  3.50 cm MITRAL VALVE               TRICUSPID VALVE MV Area (PHT): 5.13 cm    TR Peak grad:   18.7 mmHg MV Decel Time: 148 msec    TR Vmax:        216.00 cm/s MV E velocity: 67.90 cm/s MV A velocity: 99.00 cm/s  SHUNTS MV E/A ratio:  0.69  Systemic VTI:  0.26 m                            Systemic Diam: 1.70 cm Dietrich Pates MD Electronically signed by Dietrich Pates MD Signature Date/Time: 10/12/2023/5:27:16 PM    Final    DG Knee 1-2 Views Right Result Date: 10/12/2023 CLINICAL DATA:  Right knee pain.  Fall 2 days ago. EXAM: RIGHT KNEE - 1-2 VIEW COMPARISON:  None Available. FINDINGS: There is diffuse decreased bone mineralization. Moderate to severe patellofemoral joint space narrowing and peripheral osteophytosis. No joint effusion. The medial and lateral knee compartment joint spaces are maintained. Mild peripheral weight-bearing medial femoral condyle degenerative osteophytosis. No acute fracture or dislocation. 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 Electronically Signed   By: Neita Garnet M.D.   On: 10/12/2023 16:32   DG Chest Port 1 View Result Date: 10/08/2023 CLINICAL DATA:  Questionable sepsis - evaluate for abnormality EXAM: PORTABLE CHEST 1 VIEW COMPARISON:  CT chest 10/08/2023 FINDINGS: The heart and mediastinal contours are within normal limits. Other sclerotic plaque. No focal consolidation. No pulmonary edema. No pleural effusion. No pneumothorax. No acute osseous abnormality. IMPRESSION: 1. No active disease. 2.  Aortic Atherosclerosis (ICD10-I70.0). Electronically Signed   By: Tish Frederickson M.D.   On:  10/08/2023 21:26   CT Head Wo Contrast Result Date: 10/08/2023 CLINICAL DATA:  Mental status change, unknown cause EXAM: CT HEAD WITHOUT CONTRAST TECHNIQUE: Contiguous axial images were obtained from the base of the skull through the vertex without intravenous contrast. RADIATION DOSE REDUCTION: This exam was performed according to the departmental dose-optimization program which includes automated exposure control, adjustment of the mA and/or kV according to patient size and/or use of iterative reconstruction technique. COMPARISON:  CT head 06/11/2013 report without imaging. FINDINGS: Brain: No evidence of large-territorial acute infarction. No parenchymal hemorrhage. No mass lesion. No extra-axial collection. No mass effect or midline shift. No hydrocephalus. Basilar cisterns are patent. Vascular: No hyperdense vessel. Atherosclerotic calcifications are present within the cavernous internal carotid arteries. Skull: No acute fracture or focal lesion. Sinuses/Orbits: Paranasal sinuses and mastoid air cells are clear. The orbits are unremarkable. Other: None. IMPRESSION: No acute intracranial abnormality. Electronically Signed   By: Tish Frederickson M.D.   On: 10/08/2023 21:26   CT CHEST ABDOMEN PELVIS WO CONTRAST Result Date: 10/08/2023 CLINICAL DATA:  Sepsis.  Prior UTIs EXAM: CT CHEST, ABDOMEN AND PELVIS WITHOUT CONTRAST TECHNIQUE: Multidetector CT imaging of the chest, abdomen and pelvis was performed following the standard protocol without IV contrast. RADIATION DOSE REDUCTION: This exam was performed according to the departmental dose-optimization program which includes automated exposure control, adjustment of the mA and/or kV according to patient size and/or use of iterative reconstruction technique. COMPARISON:  None Available. FINDINGS: CT CHEST FINDINGS Cardiovascular: Normal heart size. No significant pericardial effusion. The thoracic aorta is normal in caliber. Mild atherosclerotic plaque of the  thoracic aorta. At least 3 vessel coronary artery calcifications. Mediastinum/Nodes: No enlarged mediastinal, hilar, or axillary lymph nodes. Thyroid gland, trachea, and esophagus demonstrate no significant findings. Tiny hiatal hernia for in Lungs/Pleura: Diffuse mild bronchial wall thickening. No focal consolidation. No pulmonary nodule. No pulmonary mass. No pleural effusion. No pneumothorax. Musculoskeletal: No chest wall abnormality. No suspicious lytic or blastic osseous lesions. No acute displaced fracture. Cervical spine surgical hardware. CT ABDOMEN PELVIS FINDINGS Hepatobiliary: No focal liver abnormality. Status post cholecystectomy. No biliary dilatation. Pancreas: No focal lesion. Slight haziness of the proximal  pancreatic contour with trace fat stranding. No surrounding inflammatory changes. No main pancreatic ductal dilatation. Spleen: Normal in size without focal abnormality. Adrenals/Urinary Tract: No adrenal nodule bilaterally. Mild left hydroureteronephrosis. No right hydroureteronephrosis. No nephroureterolithiasis bilaterally. Urinary bladder wall circumferential urinary bladder wall thickening and mild perivesicular fat stranding. Stomach/Bowel: Stomach is within normal limits. No evidence of bowel wall thickening or dilatation. Colonic diverticulosis. Appendix appears normal. Vascular/Lymphatic: No abdominal aorta or iliac aneurysm. Severe atherosclerotic plaque of the aorta and its branches. No abdominal, pelvic, or inguinal lymphadenopathy. Reproductive: Status post hysterectomy. No adnexal masses. Other: No intraperitoneal free fluid. No intraperitoneal free gas. No organized fluid collection. Musculoskeletal: No abdominal wall hernia or abnormality. No suspicious lytic or blastic osseous lesions. No acute displaced fracture. Diffusely decreased bone density. L3-L4 posterolateral surgical hardware fusion. Multilevel degenerative changes spine. IMPRESSION: 1. No acute intrathoracic  abnormality. 2. Cystitis. 3. Mild left hydroureteronephrosis. This may reflect the residual of a recently passed calculus or reflect chronic changes of obstructive uropathy or reflux. 4. Question mild acute pancreatitis.  Correlate with lipase levels. 5. Colonic diverticulosis with no acute diverticulitis. 6. Limited evaluation on this noncontrast study. Electronically Signed   By: Tish Frederickson M.D.   On: 10/08/2023 21:24    Micro Results   Recent Results (from the past 240 hours)  Resp panel by RT-PCR (RSV, Flu A&B, Covid) Anterior Nasal Swab     Status: None   Collection Time: 10/08/23  7:29 PM   Specimen: Anterior Nasal Swab  Result Value Ref Range Status   SARS Coronavirus 2 by RT PCR NEGATIVE NEGATIVE Final    Comment: (NOTE) SARS-CoV-2 target nucleic acids are NOT DETECTED.  The SARS-CoV-2 RNA is generally detectable in upper respiratory specimens during the acute phase of infection. The lowest concentration of SARS-CoV-2 viral copies this assay can detect is 138 copies/mL. A negative result does not preclude SARS-Cov-2 infection and should not be used as the sole basis for treatment or other patient management decisions. A negative result may occur with  improper specimen collection/handling, submission of specimen other than nasopharyngeal swab, presence of viral mutation(s) within the areas targeted by this assay, and inadequate number of viral copies(<138 copies/mL). A negative result must be combined with clinical observations, patient history, and epidemiological information. The expected result is Negative.  Fact Sheet for Patients:  BloggerCourse.com  Fact Sheet for Healthcare Providers:  SeriousBroker.it  This test is no t yet approved or cleared by the [place] FDA and  has been authorized for detection and/or diagnosis of SARS-CoV-2 by FDA under an Emergency Use Authorization (EUA). This EUA will remain  in  effect (meaning this test can be used) for the duration of the COVID-19 declaration under Section 564(b)(1) of the Act, 21 U.S.C.section 360bbb-3(b)(1), unless the authorization is terminated  or revoked sooner.       Influenza A by PCR NEGATIVE NEGATIVE Final   Influenza B by PCR NEGATIVE NEGATIVE Final    Comment: (NOTE) The Xpert Xpress SARS-CoV-2/FLU/RSV plus assay is intended as an aid in the diagnosis of influenza from Nasopharyngeal swab specimens and should not be used as a sole basis for treatment. Nasal washings and aspirates are unacceptable for Xpert Xpress SARS-CoV-2/FLU/RSV testing.  Fact Sheet for Patients: BloggerCourse.com  Fact Sheet for Healthcare Providers: SeriousBroker.it  This test is not yet approved or cleared by the [place] FDA and has been authorized for detection and/or diagnosis of SARS-CoV-2 by FDA under an Emergency Use Authorization (EUA). This EUA  will remain in effect (meaning this test can be used) for the duration of the COVID-19 declaration under Section 564(b)(1) of the Act, 21 U.S.C. section 360bbb-3(b)(1), unless the authorization is terminated or revoked.     Resp Syncytial Virus by PCR NEGATIVE NEGATIVE Final    Comment: (NOTE) Fact Sheet for Patients: BloggerCourse.com  Fact Sheet for Healthcare Providers: SeriousBroker.it  This test is not yet approved or cleared by the [place] FDA and has been authorized for detection and/or diagnosis of SARS-CoV-2 by FDA under an Emergency Use Authorization (EUA). This EUA will remain in effect (meaning this test can be used) for the duration of the COVID-19 declaration under Section 564(b)(1) of the Act, 21 U.S.C. section 360bbb-3(b)(1), unless the authorization is terminated or revoked.  Performed at [facility], [address] [place], [place] [address]   Blood Culture (routine  x 2)     Status: Abnormal   Collection Time: 10/08/23  7:29 PM   Specimen: BLOOD  Result Value Ref Range Status   Specimen Description   Final    BLOOD Performed at [facility], [address], [place], [place] [address]    Special Requests   Final    NONE Performed at [facility], [address] [place], [place] [address]    Culture  Setup Time   Final    GRAM POSITIVE COCCI IN BOTH AEROBIC AND ANAEROBIC BOTTLES CRITICAL RESULT CALLED TO, READ BACK BY AND VERIFIED WITH: KIM B. ON 10/09/2023 @16 :26 BY T.HAMER GRAM STAIN REVIEWED-AGREE WITH RESULT Organism ID to follow CRITICAL RESULT CALLED TO, READ BACK BY AND VERIFIED WITH: C GANOE,RN@2239  10/09/23 MK Performed at [facility], [address], [place], [place] [address]    Culture STAPHYLOCOCCUS LUGDUNENSIS (A)  Final   Report Status 10/11/2023 FINAL  Final   Organism ID, Bacteria STAPHYLOCOCCUS LUGDUNENSIS  Final      Susceptibility   Staphylococcus lugdunensis - MIC*    CIPROFLOXACIN <=0.5 SENSITIVE Sensitive     ERYTHROMYCIN <=0.25 SENSITIVE Sensitive     GENTAMICIN <=0.5 SENSITIVE Sensitive     OXACILLIN 1 SENSITIVE Sensitive     TETRACYCLINE <=1 SENSITIVE Sensitive     VANCOMYCIN <=0.5 SENSITIVE Sensitive     TRIMETH/SULFA <=10 SENSITIVE Sensitive     CLINDAMYCIN <=0.25 SENSITIVE Sensitive     RIFAMPIN <=0.5 SENSITIVE Sensitive     Inducible Clindamycin NEGATIVE Sensitive     * STAPHYLOCOCCUS LUGDUNENSIS  Urine Culture     Status: None   Collection Time: 10/08/23  7:29 PM   Specimen: Urine, Random  Result Value Ref Range Status   Specimen Description   Final    URINE, RANDOM Performed at [facility], [address], [place], [place] [address]    Special Requests   Final    NONE Reflexed from [id number] Performed at [facility], [address], [place], [place] [address]    Culture   Final    NO GROWTH Performed at [facility], [address] [place], [place] [address]    Report Status 10/10/2023  FINAL  Final  Blood Culture ID Panel (Reflexed)     Status: Abnormal   Collection Time: 10/08/23  7:29 PM  Result Value Ref Range Status   Enterococcus faecalis NOT DETECTED NOT DETECTED Final   Enterococcus Faecium NOT DETECTED NOT DETECTED Final   Listeria monocytogenes NOT DETECTED NOT DETECTED Final   Staphylococcus species DETECTED (A) NOT DETECTED Final    Comment: CRITICAL RESULT CALLED  TO, READ BACK BY AND VERIFIED WITH: C GANOE,RN@2239  10/09/23 MK    Staphylococcus aureus (BCID) NOT DETECTED NOT DETECTED Final   Staphylococcus epidermidis NOT DETECTED NOT DETECTED Final   Staphylococcus lugdunensis DETECTED (A) NOT DETECTED Final    Comment: CRITICAL RESULT CALLED TO, READ BACK BY AND VERIFIED WITH: C GANOE,RN@2239  10/09/23 MK    Streptococcus species NOT DETECTED NOT DETECTED Final   Streptococcus agalactiae NOT DETECTED NOT DETECTED Final   Streptococcus pneumoniae NOT DETECTED NOT DETECTED Final   Streptococcus pyogenes NOT DETECTED NOT DETECTED Final   A.calcoaceticus-baumannii NOT DETECTED NOT DETECTED Final   Bacteroides fragilis NOT DETECTED NOT DETECTED Final   Enterobacterales NOT DETECTED NOT DETECTED Final   Enterobacter cloacae complex NOT DETECTED NOT DETECTED Final   Escherichia coli NOT DETECTED NOT DETECTED Final   Klebsiella aerogenes NOT DETECTED NOT DETECTED Final   Klebsiella oxytoca NOT DETECTED NOT DETECTED Final   Klebsiella pneumoniae NOT DETECTED NOT DETECTED Final   Proteus species NOT DETECTED NOT DETECTED Final   Salmonella species NOT DETECTED NOT DETECTED Final   Serratia marcescens NOT DETECTED NOT DETECTED Final   Haemophilus influenzae NOT DETECTED NOT DETECTED Final   Neisseria meningitidis NOT DETECTED NOT DETECTED Final   Pseudomonas aeruginosa NOT DETECTED NOT DETECTED Final   Stenotrophomonas maltophilia NOT DETECTED NOT DETECTED Final   Candida albicans NOT DETECTED NOT DETECTED Final   Candida auris NOT DETECTED NOT DETECTED Final    Candida glabrata NOT DETECTED NOT DETECTED Final   Candida krusei NOT DETECTED NOT DETECTED Final   Candida parapsilosis NOT DETECTED NOT DETECTED Final   Candida tropicalis NOT DETECTED NOT DETECTED Final   Cryptococcus neoformans/gattii NOT DETECTED NOT DETECTED Final   Methicillin resistance mecA/C NOT DETECTED NOT DETECTED Final    Comment: Performed at [facility], [address] [place], [place] [address]  Blood Culture (routine x 2)     Status: None   Collection Time: 10/08/23  9:14 PM   Specimen: BLOOD RIGHT ARM  Result Value Ref Range Status   Specimen Description BLOOD RIGHT ARM  Final   Special Requests   Final    BOTTLES DRAWN AEROBIC ONLY Blood Culture adequate volume   Culture   Final    NO GROWTH 5 DAYS Performed at [facility], [address] [place], [place] [address]    Report Status 10/13/2023 FINAL  Final  C Difficile Quick Screen w PCR reflex     Status: Abnormal   Collection Time: 10/09/23  3:50 AM   Specimen: STOOL  Result Value Ref Range Status   C Diff antigen POSITIVE (A) NEGATIVE Final   C Diff toxin POSITIVE (A) NEGATIVE Final   C Diff interpretation Toxin producing C. difficile detected.  Final    Comment: CRITICAL RESULT CALLED TO, READ BACK BY AND VERIFIED WITH: PRUITT,G ON 10/09/23 AT 0505 BY PURDIE,J Performed at [facility], [address], [place], [place] [address]   Culture, blood (Routine X 2) w Reflex to ID Panel     Status: None (Preliminary result)   Collection Time: 10/10/23  5:38 PM   Specimen: BLOOD  Result Value Ref Range Status   Specimen Description BLOOD RIGHT ANTECUBITAL  Final   Special Requests   Final    BOTTLES DRAWN AEROBIC AND ANAEROBIC Blood Culture adequate volume   Culture   Final    NO GROWTH 4 DAYS Performed at [facility], [address] [place], [place] [address]    Report  Status PENDING  Incomplete  Culture, blood (Routine X 2) w Reflex to ID Panel     Status: None (Preliminary result)   Collection  Time: 10/10/23  5:45 PM   Specimen: BLOOD  Result Value Ref Range Status   Specimen Description BLOOD LEFT ANTECUBITAL  Final   Special Requests   Final    BOTTLES DRAWN AEROBIC AND ANAEROBIC Blood Culture adequate volume   Culture   Final    NO GROWTH 4 DAYS Performed at [facility], [address] [place], [place] [address]    Report Status PENDING  Incomplete    Today   Subjective    Malaka Ruffner today has no new complaints No fever  Or chills  Son Donnie and Daughter in law at bedside - -Diarrhea resolving   Patient has been seen and examined prior to discharge   Objective   Blood pressure 128/71, pulse 85, temperature 98.1 F (36.7 C), resp. rate 17, height 5\' 6"  (1.676 m), weight 95.6 kg, SpO2 100%.   Intake/Output Summary (Last 24 hours) at 10/14/2023 1740 Last data filed at 10/14/2023 1404 Gross per 24 hour  Intake 1020.2 ml  Output 100 ml  Net 920.2 ml    Exam Gen:- Awake Alert, no acute distress  HEENT:- [place].AT, No sclera icterus Neck-Supple Neck,No JVD,.  Lungs-  CTAB , good air movement bilaterally CV- S1, S2 normal, regular Abd-  +ve B.Sounds, Abd Soft, No tenderness,    Extremity/Skin:- No  edema,   good pulses Psych-affect is appropriate, oriented x3 Neuro-no new focal deficits, no tremors  MSK-right arm PICC line in situ   Data Review   CBC w Diff:  Lab Results  Component Value Date   WBC 7.6 10/14/2023   HGB 11.8 (L) 10/14/2023   HGB 13.7 06/01/2023   HCT 37.7 10/14/2023   HCT 43.6 06/01/2023   PLT 211 10/14/2023   PLT 193 06/01/2023   LYMPHOPCT 34 01/18/2014   MONOPCT 8 01/18/2014   EOSPCT 3 01/18/2014   BASOPCT 0 01/18/2014    CMP:  Lab Results  Component Value Date   NA 137 10/14/2023   NA 138 06/01/2023   K 4.0 10/14/2023   CL 104 10/14/2023   CO2 24 10/14/2023   BUN 11 10/14/2023   BUN 14 06/01/2023   CREATININE 0.65 10/14/2023   PROT 6.5 10/08/2023   PROT 6.0 06/01/2023   ALBUMIN 3.2 (L) 10/08/2023   ALBUMIN 4.0  06/01/2023   BILITOT 0.6 10/08/2023   BILITOT 0.5 06/01/2023   ALKPHOS 173 (H) 10/08/2023   AST 22 10/08/2023   ALT 20 10/08/2023  .  Total Discharge time is about 33 minutes  Shon Hale M.D on 10/14/2023 at 5:40 PM  Go to [email or web address] -  for contact info  [facility] - Office  [phone]

## 2023-10-14 NOTE — Progress Notes (Signed)
 Peripherally Inserted Central Catheter Placement  The IV Nurse has discussed with the patient and/or persons authorized to consent for the patient, the purpose of this procedure and the potential benefits and risks involved with this procedure.  The benefits include less needle sticks, lab draws from the catheter, and the patient may be discharged home with the catheter. Risks include, but not limited to, infection, bleeding, blood clot (thrombus formation), and puncture of an artery; nerve damage and irregular heartbeat and possibility to perform a PICC exchange if needed/ordered by physician.  Alternatives to this procedure were also discussed.  Bard Power PICC patient education guide, fact sheet on infection prevention and patient information card has been provided to patient /or left at bedside.    PICC Placement Documentation  PICC Single Lumen 10/14/23 Right Basilic 40 cm 2 cm (Active)  Indication for Insertion or Continuance of Line Home intravenous therapies (PICC only) 10/14/23 1054  Exposed Catheter (cm) 2 cm 10/14/23 1054  Site Assessment Clean, Dry, Intact 10/14/23 1054  Line Status Flushed;Saline locked;Blood return noted 10/14/23 1054  Dressing Type Transparent 10/14/23 1054  Dressing Status Antimicrobial disc/dressing in place 10/14/23 1054  Line Care Cap changed;Connections checked and tightened 10/14/23 1054  Dressing Change Due 10/21/23 10/14/23 1054       Alisi Lupien Westley Hummer 10/14/2023, 10:58 AM

## 2023-10-14 NOTE — Plan of Care (Signed)
  Problem: Respiratory: Goal: Ability to maintain adequate ventilation will improve Outcome: Adequate for Discharge   Problem: Education: Goal: Knowledge of General Education information will improve Description: Including pain rating scale, medication(s)/side effects and non-pharmacologic comfort measures Outcome: Adequate for Discharge   Problem: Health Behavior/Discharge Planning: Goal: Ability to manage health-related needs will improve Outcome: Adequate for Discharge

## 2023-10-15 LAB — CULTURE, BLOOD (ROUTINE X 2)
Culture: NO GROWTH
Culture: NO GROWTH
Special Requests: ADEQUATE
Special Requests: ADEQUATE

## 2023-10-17 ENCOUNTER — Telehealth: Payer: Self-pay | Admitting: *Deleted

## 2023-10-17 ENCOUNTER — Ambulatory Visit: Payer: Medicare Other | Admitting: Nutrition

## 2023-10-17 NOTE — Anesthesia Postprocedure Evaluation (Signed)
 Anesthesia Post Note  Patient: Amy Livingston  Procedure(s) Performed: ECHOCARDIOGRAM, TRANSESOPHAGEAL  Patient location during evaluation: Phase II Anesthesia Type: General Level of consciousness: awake Pain management: pain level controlled Vital Signs Assessment: post-procedure vital signs reviewed and stable Respiratory status: spontaneous breathing and respiratory function stable Cardiovascular status: blood pressure returned to baseline and stable Postop Assessment: no headache and no apparent nausea or vomiting Anesthetic complications: no Comments: Late entry   No notable events documented.   Last Vitals:  Vitals:   10/14/23 1102 10/14/23 1405  BP: (!) 151/73 128/71  Pulse: 89 85  Resp: 17 17  Temp: 36.7 C 36.7 C  SpO2: 100% 100%    Last Pain:  Vitals:   10/14/23 1102  TempSrc: Oral  PainSc:                  Windell Norfolk

## 2023-10-17 NOTE — Transitions of Care (Post Inpatient/ED Visit) (Signed)
   10/17/2023  Name: Amy Livingston MRN: [id number] DOB: 1955-11-02  Today's TOC FU Call Status: Today's TOC FU Call Status:: Unsuccessful Call (1st Attempt) Unsuccessful Call (1st Attempt) Date: 10/17/23  Attempted to reach the patient regarding the most recent Inpatient/ED visit.  Follow Up Plan: Additional outreach attempts will be made to reach the patient to complete the Transitions of Care (Post Inpatient/ED visit) call.   Gean Maidens BSN RN [place] Management Coordinator [email or web address] Direct Dial: [phone]  Fax: [phone] Website: [email or web address]

## 2023-10-18 ENCOUNTER — Telehealth: Payer: Self-pay | Admitting: *Deleted

## 2023-10-18 ENCOUNTER — Other Ambulatory Visit: Payer: Self-pay | Admitting: Family Medicine

## 2023-10-18 LAB — LAB REPORT - SCANNED: EGFR: 98

## 2023-10-18 NOTE — Transitions of Care (Post Inpatient/ED Visit) (Signed)
 10/18/2023  Name: Amy Livingston MRN: [id number] DOB: Nov 25, 1955  Today's TOC FU Call Status: Today's TOC FU Call Status:: Successful TOC FU Call Completed TOC FU Call Complete Date: 10/18/23 Patient's Name and Date of Birth confirmed.  Transition Care Management Follow-up Telephone Call Date of Discharge: 10/14/23 [facility] Type of Discharge: Inpatient Admission Primary Inpatient Discharge Diagnosis:: Sepsis due to gram-negative/Urinary tract infection without hematuria How have you been since you were released from the hospital?: Better Any questions or concerns?: No  Items Reviewed: Did you receive and understand the discharge instructions provided?: Yes Medications obtained,verified, and reconciled?: Yes (Medications Reviewed) Any new allergies since your discharge?: No Dietary orders reviewed?: No Do you have support at home?: Yes People in Home: child(ren), adult, alone Name of Support/Comfort Primary Source: Alden Server staying with son for now  Medications Reviewed Today: Medications Reviewed Today     Reviewed by Luella Cook, RN (Case Manager) on 10/18/23 at 1350  Med List Status: <None>   Medication Order Taking? Sig Documenting Provider Last Dose Status Informant  acetaminophen (TYLENOL) 325 MG tablet [id number] Yes Take 2 tablets (650 mg total) by mouth every 6 (six) hours as needed for mild pain (pain score 1-3), headache or fever (or Fever >/= 101). Shon Hale, MD Taking Active   atorvastatin (LIPITOR) 80 MG tablet [id number] Yes Take 1 tablet (80 mg total) by mouth daily.  Patient taking differently: Take 80 mg by mouth at bedtime.   Gilmore Laroche, FNP Taking Active Self, Pharmacy Records  Blood Glucose Monitoring Suppl DEVI [id number] Yes 1 each by Does not apply route in the morning, at noon, and at bedtime. May substitute to any manufacturer covered by patient's insurance. Gilmore Laroche, FNP Taking Active Self, Pharmacy Records   busPIRone (BUSPAR) 10 MG tablet [id number] Yes Take 1 tablet (10 mg total) by mouth 2 (two) times daily. Gilmore Laroche, FNP Taking Active Self, Pharmacy Records  ceFAZolin (ANCEF) IVPB [id number] Yes Inject 2 g into the vein every 8 (eight) hours for 10 days. Indication:  Staph lug bacteremia First Dose: Yes Last Day of Therapy:  10/24/23 Labs - Once weekly:  CBC/D and BMP, Labs - Once weekly: ESR and CRP Method of administration: IV Push Method of administration may be changed at the discretion of home infusion pharmacist based upon assessment of the patient and/or caregiver's ability to self-administer the medication ordered. Shon Hale, MD Taking Active   Cholecalciferol (VITAMIN D-3) 25 MCG (1000 UT) CAPS [id number] Yes Take 1,000 Units by mouth 2 (two) times daily at 10 am and 4 pm. [provider] Taking Active Self, Pharmacy Records  cyclobenzaprine (FLEXERIL) 10 MG tablet [id number] Yes Take 10 mg by mouth every 12 (twelve) hours. [provider] Taking Active Self, Pharmacy Records  ELIQUIS 5 MG TABS tablet [id number] Yes Take 5 mg by mouth 2 (two) times daily. [provider] Taking Active Self, Pharmacy Records  gabapentin (NEURONTIN) 300 MG capsule [id number] Yes Take 1 capsule (300 mg total) by mouth 3 (three) times daily. Gilmore Laroche, FNP Taking Active Self, Pharmacy Records  hydrocortisone [facility]) 25 MG suppository [id number] Yes Place 1 suppository (25 mg total) rectally every 12 (twelve) hours. Shon Hale, MD Taking Active   Insulin NPH Isophane & Regular (NOVOLIN 70/30 FLEXPEN [place]) [id number] Yes Inject 40 Units into the skin 2 (two) times daily with a meal. [provider] Taking Active Self, Pharmacy Records  lisinopril (ZESTRIL) 5 MG tablet [id number] Yes Take 1 tablet (5  mg total) by mouth daily. Gilmore Laroche, FNP Taking Active Self, Pharmacy Records  meclizine (ANTIVERT) 25 MG tablet [id number] Yes Take 25 mg by mouth daily as  needed for dizziness. [provider] Taking Active Self, Pharmacy Records  metoprolol tartrate (LOPRESSOR) 50 MG tablet [id number] Yes Take 1 tablet (50 mg total) by mouth 2 (two) times daily. Shon Hale, MD Taking Active   mirabegron ER (MYRBETRIQ) 25 MG TB24 tablet [id number] Yes Take 1 tablet (25 mg total) by mouth daily.  Patient taking differently: Take 25 mg by mouth at bedtime.   Gilmore Laroche, FNP Taking Active Self, Pharmacy Records  ondansetron [facility]) 4 MG tablet [id number] Yes Take 1 tablet (4 mg total) by mouth every 6 (six) hours as needed for nausea. Shon Hale, MD Taking Active   oxyCODONE-acetaminophen (PERCOCET) 10-325 MG tablet [id number] Yes Take 1 tablet by mouth every 8 (eight) hours as needed for pain. [provider] Taking Active Self, Pharmacy Records  RESTASIS 0.05 % ophthalmic emulsion [id number] Yes Place 1 drop into both eyes 2 (two) times daily as needed (dry eye). [provider] Taking Active Self, Pharmacy Records  sertraline (ZOLOFT) 50 MG tablet [id number] Yes Take 1 tablet (50 mg total) by mouth daily.  Patient taking differently: Take 50 mg by mouth at bedtime.   Gilmore Laroche, FNP Taking Active Self, Pharmacy Records  tirzepatide [facility]) 5 MG/0.5ML Pen [id number] Yes Inject 5 mg into the skin once a week.  Patient taking differently: Inject 5 mg into the skin every Saturday.   Gilmore Laroche, FNP Taking Active Self, Pharmacy Records           Med Note (COFFELL, Marzella Schlein   Sun Oct 09, 2023 11:12 AM) Pt states she started the 5mg  injection on 3/1. No fill history found for 5mg  strength.  vancomycin (VANCOCIN) 125 MG capsule [id number] Yes Take 1 capsule (125 mg total) by mouth 4 (four) times daily for 5 days, THEN 1 capsule (125 mg total) daily for 13 days. Shon Hale, MD Taking Active             Home Care and Equipment/Supplies: Were Home Health Services Ordered?: Yes Name of Home Health Agency::  [facility] Has Agency set up a time to come to your home?: Yes First Home Health Visit Date: 10/17/23 Any new equipment or medical supplies ordered?: NA  Functional Questionnaire: Do you need assistance with bathing/showering or dressing?: Yes Do you need assistance with meal preparation?: Yes Do you need assistance with eating?: No Do you have difficulty maintaining continence: No Do you need assistance with getting out of bed/getting out of a chair/moving?: No Do you have difficulty managing or taking your medications?: No  Follow up appointments reviewed: PCP Follow-up appointment confirmed?: NA Specialist Hospital Follow-up appointment confirmed?: Yes Date of Specialist follow-up appointment?: 10/25/23 Follow-Up Specialty Provider:: Dr Odette Fraction Do you need transportation to your follow-up appointment?: No Do you understand care options if your condition(s) worsen?: Yes-patient verbalized understanding  SDOH Interventions Today    Flowsheet Row Most Recent Value  SDOH Interventions   Food Insecurity Interventions Intervention Not Indicated  Housing Interventions Intervention Not Indicated  Transportation Interventions Intervention Not Indicated, Patient Resources (Friends/Family)  Utilities Interventions Intervention Not Indicated      Interventions Today    Flowsheet Row Most Recent Value  Chronic Disease   Chronic disease during today's visit Other  [Sepsis due to gram-negative/  Urinary tract infection without hematuria]  General Interventions   General Interventions  Discussed/Reviewed General Interventions Discussed, General Interventions Reviewed, Doctor Visits  Doctor Visits Discussed/Reviewed Doctor Visits Discussed, Doctor Visits Reviewed, Specialist  Pharmacy Interventions   Pharmacy Dicussed/Reviewed Pharmacy Topics Discussed, Pharmacy Topics Reviewed  [Discussed medication being delivered and [facility] following with PICC dressing changes]       Gean Maidens BSN RN [facility] Health Care Management Coordinator [email or web address] Direct Dial: [phone]  Fax: [phone] Website: [email or web address]

## 2023-10-18 NOTE — Telephone Encounter (Signed)
 Last Fill:   Last OV: 09/28/23 Next OV: 10/02/24 AWV  Routing to provider for review/authorization.

## 2023-10-18 NOTE — Telephone Encounter (Signed)
 Copied from CRM [id number]. Topic: Clinical - Medication Refill >> Oct 18, 2023  1:19 PM Franchot Heidelberg wrote: Most Recent Primary Care Visit:  Provider: Recardo Evangelist A  Department: [facility]  Visit Type: ANNUAL WELL VISIT, SEQUENTIAL  Date: 09/28/2023  Medication: Insulin NPH Isophane & Regular (NOVOLIN 70/30 FLEXPEN [place])  Has requested this since last week when she left the hospital she says   Has the patient contacted their pharmacy? Yes (Agent: If no, request that the patient contact the pharmacy for the refill. If patient does not wish to contact the pharmacy document the reason why and proceed with request.) (Agent: If yes, when and what did the pharmacy advise?)  Is this the correct pharmacy for this prescription? Yes If no, delete pharmacy and type the correct one.  This is the patient's preferred pharmacy:    [facility] [phone] - [place],  - [address] AT [facility] OF [address] [place] [address] Phone: [phone] Fax: [phone]   Has the prescription been filled recently? Yes  Is the patient out of the medication? Yes  Has the patient been seen for an appointment in the last year OR does the patient have an upcoming appointment? Yes  Can we respond through MyChart? Yes  Agent: Please be advised that Rx refills may take up to 3 business days. We ask that you follow-up with your pharmacy.

## 2023-10-19 ENCOUNTER — Other Ambulatory Visit: Payer: Self-pay | Admitting: Family Medicine

## 2023-10-20 ENCOUNTER — Other Ambulatory Visit: Payer: Self-pay | Admitting: Family Medicine

## 2023-10-20 DIAGNOSIS — E1165 Type 2 diabetes mellitus with hyperglycemia: Secondary | ICD-10-CM

## 2023-10-20 NOTE — Telephone Encounter (Signed)
 Copied from CRM [id number]. Topic: Clinical - Medication Refill >> Oct 20, 2023  9:45 AM Higinio Roger wrote: Most Recent Primary Care Visit:  Provider: Recardo Evangelist A  Department: [facility]  Visit Type: ANNUAL WELL VISIT, SEQUENTIAL  Date: 09/28/2023  Medication: tirzepatide [facility]) 5 MG/0.5ML Pen  Has the patient contacted their pharmacy? Yes (Agent: If no, request that the patient contact the pharmacy for the refill. If patient does not wish to contact the pharmacy document the reason why and proceed with request.) (Agent: If yes, when and what did the pharmacy advise?) Waiting for prescription  Is this the correct pharmacy for this prescription? Yes If no, delete pharmacy and type the correct one.  This is the patient's preferred pharmacy:    [facility] [phone] - [place], [place] - [address] AT [facility] OF [address] [place] [address] Phone: [phone] Fax: [phone]   Has the prescription been filled recently? Yes  Is the patient out of the medication? Yes  Has the patient been seen for an appointment in the last year OR does the patient have an upcoming appointment? Yes  Can we respond through MyChart? Yes  Agent: Please be advised that Rx refills may take up to 3 business days. We ask that you follow-up with your pharmacy.

## 2023-10-21 ENCOUNTER — Other Ambulatory Visit: Payer: Self-pay | Admitting: Family Medicine

## 2023-10-22 ENCOUNTER — Other Ambulatory Visit: Payer: Self-pay | Admitting: Family Medicine

## 2023-10-22 DIAGNOSIS — Z794 Long term (current) use of insulin: Secondary | ICD-10-CM

## 2023-10-22 MED ORDER — NOVOLIN 70/30 FLEXPEN (70-30) 100 UNIT/ML ~~LOC~~ SUPN: PEN_INJECTOR | SUBCUTANEOUS | 1 refills | Status: DC

## 2023-10-22 MED ORDER — TIRZEPATIDE 7.5 MG/0.5ML ~~LOC~~ SOAJ
7.5000 mg | SUBCUTANEOUS | 0 refills | Status: DC
Start: 2023-10-22 — End: 2023-11-06

## 2023-10-25 ENCOUNTER — Inpatient Hospital Stay: Payer: Self-pay | Admitting: Infectious Diseases

## 2023-10-26 ENCOUNTER — Encounter: Payer: Medicare Other | Admitting: *Deleted

## 2023-10-27 ENCOUNTER — Inpatient Hospital Stay: Payer: Self-pay | Admitting: Infectious Diseases

## 2023-10-28 ENCOUNTER — Encounter: Payer: Self-pay | Admitting: *Deleted

## 2023-11-02 ENCOUNTER — Other Ambulatory Visit: Payer: Self-pay | Admitting: Pharmacist

## 2023-11-02 NOTE — Progress Notes (Signed)
 11/02/2023 Name: Amy Livingston MRN: [id number] DOB: 11-23-1955  Chief Complaint  Patient presents with   Diabetes    Amy Livingston is a 68 y.o. year old female who presented for a telephone visit.   They were referred to the pharmacist by their PCP for assistance in managing diabetes.    Subjective:  Care Team: Primary Care Provider: Zarwolo, Gloria, FNP    Medication Access/Adherence  Current Pharmacy:  [facility] [phone] - [place],  - [address] AT [facility] OF [address] [place] [address] Phone: [phone] Fax: [phone]  Diabetes:  Current medications: Mounjaro 5mg , novolin 70/30 30-40 units BID Medications tried in the past:  Trulicity last filled 03/18/22 Jardiance filled 07/28/21 Tresiba 05/05/22 Levemir 10/29/21 Omnipod 2023 Lispro vial 06/04/23 Humalog mix 50/50 10/2021 Novolin 70/30 08/24/23 Metformin 06/03/23--can't tolerate due to GI Ozempic 02/10/22 Mounjaro 07/09/23 A1c >15.5 06/01/23-->DECREASED to 12.4%   Current glucose readings: 300-400, reports no FBG>300 Was using CGM--insurance issues    Using accu chek guide meter   Patient denies hypoglycemic s/sx including dizziness, shakiness, sweating. Patient reports hyperglycemic symptoms including polyuria, polydipsia, polyphagia, nocturia, neuropathy, blurred vision.   Current meal patterns:  Discussed meal planning options and Plate method for healthy eating Avoid sugary drinks and desserts Incorporate balanced protein, non starchy veggies, 1 serving of carbohydrate with each meal Increase water intake Increase physical activity as able   Current physical activity: encourage as able   Current medication access support: n/a     Objective:  Lab Results  Component Value Date   HGBA1C 12.4 (H) 10/09/2023    Lab Results  Component Value Date   CREATININE 0.67 11/06/2023   BUN 15 11/06/2023   NA 138 11/06/2023   K 3.4 (L) 11/06/2023    CL 104 11/06/2023   CO2 26 11/06/2023    Lab Results  Component Value Date   CHOL 130 06/01/2023   HDL 49 06/01/2023   LDLCALC 57 06/01/2023   TRIG 138 06/01/2023   CHOLHDL 2.7 06/01/2023    Medications Reviewed Today     Reviewed by Delilah Fend, [facility] (Pharmacist) on 11/23/23 at 1432  Med List Status: <None>   Medication Order Taking? Sig Documenting Provider Last Dose Status Informant  acetaminophen (TYLENOL) 325 MG tablet [id number] No Take 2 tablets (650 mg total) by mouth every 6 (six) hours as needed for mild pain (pain score 1-3), headache or fever (or Fever >/= 101). Colin Dawley, MD Taking Active Self  atorvastatin (LIPITOR) 80 MG tablet [id number] No Take 1 tablet (80 mg total) by mouth at bedtime. Johnson, Clanford L, MD Taking Active   busPIRone (BUSPAR) 10 MG tablet [id number] No Take 1 tablet (10 mg total) by mouth 2 (two) times daily. Zarwolo, Gloria, FNP Taking Active Self  Cholecalciferol (VITAMIN D-3) 25 MCG (1000 UT) CAPS [id number] No Take 1,000 Units by mouth 2 (two) times daily at 10 am and 4 pm. [provider] Taking Active Self  cyclobenzaprine (FLEXERIL) 10 MG tablet [id number] No Take 10 mg by mouth every 12 (twelve) hours. [provider] Taking Active Self  ELIQUIS 5 MG TABS tablet [id number] No Take 5 mg by mouth 2 (two) times daily. [provider] Taking Active Self  fluconazole (DIFLUCAN) 150 MG tablet [id number] No Take 1 tablet (150 mg total) by mouth daily. Orlie Bjornstad, MD Taking Active   gabapentin (NEURONTIN) 300 MG capsule [id number] No Take 1 capsule (300 mg total) by mouth 3 (  three) times daily. Zarwolo, Gloria, FNP Taking Active Self  insulin isophane & regular human KwikPen (NOVOLIN 70/30 KWIKPEN) (70-30) 100 UNIT/ML KwikPen [id number]  Inject 30 Units into the skin 2 (two) times daily with a meal. Zarwolo, Gloria, FNP  Active   lisinopril (ZESTRIL) 5 MG tablet [id number] No Take 0.5 tablets (2.5 mg total) by mouth daily.  Rayfield Cairo, MD Taking Active   meclizine (ANTIVERT) 25 MG tablet [id number] No Take 25 mg by mouth daily as needed for dizziness. [provider] Taking Active Self  metoprolol tartrate (LOPRESSOR) 50 MG tablet [id number] No Take 0.5 tablets (25 mg total) by mouth 2 (two) times daily. Johnson, Clanford L, MD Taking Active   mirabegron ER (MYRBETRIQ) 25 MG TB24 tablet [id number] No Take 1 tablet (25 mg total) by mouth at bedtime. Rayfield Cairo, MD Taking Active   nitrofurantoin, macrocrystal-monohydrate, (MACROBID) 100 MG capsule [id number]  Take 1 capsule (100 mg total) by mouth 2 (two) times daily for 7 days. Zarwolo, Gloria, FNP  Active   omeprazole (PRILOSEC) 40 MG capsule [id number] No TAKE 1 CAPSULE(40 MG) BY MOUTH DAILY Zarwolo, Gloria, FNP Taking Active   ondansetron (ZOFRAN) 4 MG tablet [id number] No Take 1 tablet (4 mg total) by mouth every 6 (six) hours as needed for nausea. Colin Dawley, MD Taking Active Self  oxyCODONE-acetaminophen (PERCOCET) 10-325 MG tablet [id number] No Take 1 tablet by mouth every 8 (eight) hours as needed for pain. [provider] Taking Active Self  sertraline (ZOLOFT) 50 MG tablet [id number] No Take 1 tablet (50 mg total) by mouth at bedtime. Rayfield Cairo, MD Taking Active   tirzepatide [facility]) 10 MG/0.5ML Pen [id number]  Inject 10 mg into the skin once a week. Zarwolo, Gloria, FNP  Active   traZODone (DESYREL) 100 MG tablet [id number]  Take 1 tablet (100 mg total) by mouth at bedtime. Zarwolo, Gloria, FNP  Active             Assessment/Plan:   Diabetes: - Currently uncontrolled, A1c has improved from >15% -->12.4% - Reviewed long term cardiovascular and renal outcomes of uncontrolled blood sugar - Reviewed goal A1c, goal fasting, and goal 2 hour post prandial glucose - Recommend to : -Continue Novolin 70/30 pen --30-40 units twice daily -Increase Mounjaro to 5 mg weekly (next shot Tuesday--double up to equal  5mg )--continue to titrate -Needs Libre 3 PLUS (has but has not put on) -Hasn't gotten lantus will hold and continue 70/30 for now - Patient denies personal or family history of multiple endocrine neoplasia type 2, medullary thyroid cancer; personal history of pancreatitis or gallbladder disease.     Follow Up Plan: 1 month with PHarmD   Marvell Slider, PharmD, BCACP, CPP Clinical Pharmacist, [facility]

## 2023-11-04 ENCOUNTER — Encounter: Payer: Self-pay | Admitting: *Deleted

## 2023-11-04 ENCOUNTER — Telehealth: Admitting: Physician Assistant

## 2023-11-04 ENCOUNTER — Ambulatory Visit: Payer: Self-pay | Admitting: *Deleted

## 2023-11-04 DIAGNOSIS — Z8619 Personal history of other infectious and parasitic diseases: Secondary | ICD-10-CM

## 2023-11-04 DIAGNOSIS — R42 Dizziness and giddiness: Secondary | ICD-10-CM

## 2023-11-04 DIAGNOSIS — Y92009 Unspecified place in unspecified non-institutional (private) residence as the place of occurrence of the external cause: Secondary | ICD-10-CM

## 2023-11-04 NOTE — Progress Notes (Signed)
 Virtual Visit Consent   Amy Livingston, you are scheduled for a virtual visit with a [facility] provider today. Just as with appointments in the office, your consent must be obtained to participate. Your consent will be active for this visit and any virtual visit you may have with one of our providers in the next 365 days. If you have a MyChart account, a copy of this consent can be sent to you electronically.  As this is a virtual visit, video technology does not allow for your provider to perform a traditional examination. This may limit your provider's ability to fully assess your condition. If your provider identifies any concerns that need to be evaluated in person or the need to arrange testing (such as labs, EKG, etc.), we will make arrangements to do so. Although advances in technology are sophisticated, we cannot ensure that it will always work on either your end or our end. If the connection with a video visit is poor, the visit may have to be switched to a telephone visit. With either a video or telephone visit, we are not always able to ensure that we have a secure connection.  By engaging in this virtual visit, you consent to the provision of healthcare and authorize for your insurance to be billed (if applicable) for the services provided during this visit. Depending on your insurance coverage, you may receive a charge related to this service.  I need to obtain your verbal consent now. Are you willing to proceed with your visit today? Amy Livingston has provided verbal consent on 11/04/2023 for a virtual visit (video or telephone). Amy Livingston, [place]  Date: 11/04/2023 3:31 PM   Virtual Visit via Video Note   I, Amy Livingston, connected with  Amy Livingston  ([id number], 09-19-1955) on 11/04/23 at  3:30 PM EDT by a video-enabled telemedicine application and verified that I am speaking with the correct person using two identifiers.  Location: Patient: Virtual Visit Location  Patient: Home Provider: Virtual Visit Location Provider: Home Office   I discussed the limitations of evaluation and management by telemedicine and the availability of in person appointments. The patient expressed understanding and agreed to proceed.    History of Present Illness: Amy Livingston is a 68 y.o. who identifies as a female who was assigned female at birth, and is being seen today for episodes of dizziness with falls at home. Describes dizziness as more of an off-balance and lightheaded sensations. Fall at home overnight. Denies head injury or LOC. Is still ambulating successfully with her cane. Also noting recurrence of cludy and foul smelling urine. Denies fever or chills. Was recently  discharged from hospital 2 weeks ago for sepsis 2/2 UTI.  HPI: HPI  Problems:  Patient Active Problem List   Diagnosis Date Noted   Bacteremia due to other bacteria 10/13/2023   Paroxysmal atrial fibrillation (HCC) 10/09/2023   Anxiety and depression 10/09/2023   Syncope and collapse 10/09/2023   BRBPR (bright red blood per rectum) 10/09/2023   Enteritis due to Clostridium difficile 10/09/2023   Diarrhea of infectious origin 10/09/2023   Sepsis due to gram-negative UTI (HCC) 10/08/2023   Hypokalemia 10/08/2023   Hyponatremia 10/08/2023   Uncontrolled type 2 diabetes mellitus with hyperglycemia, with long-term current use of insulin (HCC) 10/08/2023   Essential hypertension 10/08/2023   Peripheral neuropathy 10/08/2023   Overactive bladder 06/17/2023   Generalized anxiety disorder 06/17/2023   Type 2 diabetes mellitus with diabetic neuropathy, unspecified (HCC) 06/17/2023   Hypertension  06/01/2023   Urinary frequency 06/01/2023   Bowel incontinence 06/01/2023   Generalized weakness 06/01/2023   Encounter for immunization 06/01/2023   Diabetes 1.5, 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   PAF (paroxysmal atrial fibrillation) (HCC) 08/14/2020   Dyslipidemia 07/01/2020   Type 2 diabetes  mellitus with hyperglycemia (HCC) 07/01/2020   Body mass index (BMI) 40.0-44.9, adult (HCC) 11/07/2019   Elevated blood-pressure reading, without diagnosis of hypertension 11/07/2019   Spondylolisthesis, lumbar region 07/18/2018   Cervical spondylosis with myelopathy 01/13/2016   Cervical disc disorder with myelopathy 12/04/2015   Neck pain 12/04/2015    Allergies:  Allergies  Allergen Reactions   Elemental Sulfur Itching and Rash    Yeast infections   Metformin And Related Diarrhea    GI distress   Medications:  Current Outpatient Medications:    acetaminophen (TYLENOL) 325 MG tablet, Take 2 tablets (650 mg total) by mouth every 6 (six) hours as needed for mild pain (pain score 1-3), headache or fever (or Fever >/= 101)., Disp: , Rfl:    atorvastatin (LIPITOR) 80 MG tablet, Take 1 tablet (80 mg total) by mouth daily. (Patient taking differently: Take 80 mg by mouth at bedtime.), Disp: 90 tablet, Rfl: 1   Blood Glucose Monitoring Suppl DEVI, 1 each by Does not apply route in the morning, at noon, and at bedtime. May substitute to any manufacturer covered by patient's insurance., Disp: 1 each, Rfl: 0   busPIRone (BUSPAR) 10 MG tablet, Take 1 tablet (10 mg total) by mouth 2 (two) times daily., Disp: 90 tablet, Rfl: 1   Cholecalciferol (VITAMIN D-3) 25 MCG (1000 UT) CAPS, Take 1,000 Units by mouth 2 (two) times daily at 10 am and 4 pm., Disp: , Rfl:    cyclobenzaprine (FLEXERIL) 10 MG tablet, Take 10 mg by mouth every 12 (twelve) hours., Disp: , Rfl:    ELIQUIS 5 MG TABS tablet, Take 5 mg by mouth 2 (two) times daily., Disp: , Rfl:    gabapentin (NEURONTIN) 300 MG capsule, Take 1 capsule (300 mg total) by mouth 3 (three) times daily., Disp: 120 capsule, Rfl: 1   hydrocortisone (ANUSOL-HC) 25 MG suppository, Place 1 suppository (25 mg total) rectally every 12 (twelve) hours., Disp: 12 suppository, Rfl: 1   insulin isophane & regular human KwikPen (NOVOLIN 70/30 KWIKPEN) (70-30) 100 UNIT/ML  KwikPen, Inject 40 Units into the skin 2 (two) times daily with a meal., Disp: 3 mL, Rfl: 1   lisinopril (ZESTRIL) 5 MG tablet, Take 1 tablet (5 mg total) by mouth daily., Disp: 90 tablet, Rfl: 3   meclizine (ANTIVERT) 25 MG tablet, Take 25 mg by mouth daily as needed for dizziness., Disp: , Rfl:    metoprolol tartrate (LOPRESSOR) 50 MG tablet, Take 1 tablet (50 mg total) by mouth 2 (two) times daily., Disp: 60 tablet, Rfl: 2   mirabegron ER (MYRBETRIQ) 25 MG TB24 tablet, Take 1 tablet (25 mg total) by mouth daily. (Patient taking differently: Take 25 mg by mouth at bedtime.), Disp: 90 tablet, Rfl: 1   ondansetron (ZOFRAN) 4 MG tablet, Take 1 tablet (4 mg total) by mouth every 6 (six) hours as needed for nausea., Disp: 20 tablet, Rfl: 0   oxyCODONE-acetaminophen (PERCOCET) 10-325 MG tablet, Take 1 tablet by mouth every 8 (eight) hours as needed for pain., Disp: , Rfl:    RESTASIS 0.05 % ophthalmic emulsion, Place 1 drop into both eyes 2 (two) times daily as needed (dry eye)., Disp: , Rfl:    sertraline (  ZOLOFT) 50 MG tablet, Take 1 tablet (50 mg total) by mouth daily. (Patient taking differently: Take 50 mg by mouth at bedtime.), Disp: 90 tablet, Rfl: 1   tirzepatide (MOUNJARO) 7.5 MG/0.5ML Pen, Inject 7.5 mg into the skin once a week., Disp: 2 mL, Rfl: 0  Observations/Objective: Patient is well-developed, well-nourished in no acute distress.  Resting comfortably at home.  Head is normocephalic, atraumatic.  No labored breathing. Speech is clear and coherent with logical content.  Patient is alert and oriented at baseline.   Assessment and Plan: 1. Dizziness (Primary)  2. Fall in home, initial encounter  3. Hx of gram negative sepsis  Needs in person evaluation ASAP giving current symptoms and recent hospitalizations. She agrees to have family member take her for evaluation and they will call EMS if anything acutely worsens to where they do not feel safe to drive her.   Follow Up  Instructions: I discussed the assessment and treatment plan with the patient. The patient was provided an opportunity to ask questions and all were answered. The patient agreed with the plan and demonstrated an understanding of the instructions.  A copy of instructions were sent to the patient via MyChart unless otherwise noted below.   The patient was advised to call back or seek an in-person evaluation if the symptoms worsen or if the condition fails to improve as anticipated.    Amy Climes, PA-C

## 2023-11-04 NOTE — Patient Instructions (Signed)
 Gordy Clement, thank you for joining Piedad Climes, PA-C for today's virtual visit.  While this provider is not your primary care provider (PCP), if your PCP is located in our provider database this encounter information will be shared with them immediately following your visit.   A [facility] MyChart account gives you access to today's visit and all your visits, tests, and labs performed at [facility] " click here if you don't have a [facility] MyChart account or go to [email or web address]  Consent: (Patient) Amy Livingston provided verbal consent for this virtual visit at the beginning of the encounter.  Current Medications:  Current Outpatient Medications:    acetaminophen (TYLENOL) 325 MG tablet, Take 2 tablets (650 mg total) by mouth every 6 (six) hours as needed for mild pain (pain score 1-3), headache or fever (or Fever >/= 101)., Disp: , Rfl:    atorvastatin (LIPITOR) 80 MG tablet, Take 1 tablet (80 mg total) by mouth daily. (Patient taking differently: Take 80 mg by mouth at bedtime.), Disp: 90 tablet, Rfl: 1   Blood Glucose Monitoring Suppl DEVI, 1 each by Does not apply route in the morning, at noon, and at bedtime. May substitute to any manufacturer covered by patient's insurance., Disp: 1 each, Rfl: 0   busPIRone (BUSPAR) 10 MG tablet, Take 1 tablet (10 mg total) by mouth 2 (two) times daily., Disp: 90 tablet, Rfl: 1   Cholecalciferol (VITAMIN D-3) 25 MCG (1000 UT) CAPS, Take 1,000 Units by mouth 2 (two) times daily at 10 am and 4 pm., Disp: , Rfl:    cyclobenzaprine (FLEXERIL) 10 MG tablet, Take 10 mg by mouth every 12 (twelve) hours., Disp: , Rfl:    ELIQUIS 5 MG TABS tablet, Take 5 mg by mouth 2 (two) times daily., Disp: , Rfl:    gabapentin (NEURONTIN) 300 MG capsule, Take 1 capsule (300 mg total) by mouth 3 (three) times daily., Disp: 120 capsule, Rfl: 1   hydrocortisone (ANUSOL-HC) 25 MG suppository, Place 1 suppository (25 mg total) rectally every 12  (twelve) hours., Disp: 12 suppository, Rfl: 1   insulin isophane & regular human KwikPen (NOVOLIN 70/30 KWIKPEN) (70-30) 100 UNIT/ML KwikPen, Inject 40 Units into the skin 2 (two) times daily with a meal., Disp: 3 mL, Rfl: 1   lisinopril (ZESTRIL) 5 MG tablet, Take 1 tablet (5 mg total) by mouth daily., Disp: 90 tablet, Rfl: 3   meclizine (ANTIVERT) 25 MG tablet, Take 25 mg by mouth daily as needed for dizziness., Disp: , Rfl:    metoprolol tartrate (LOPRESSOR) 50 MG tablet, Take 1 tablet (50 mg total) by mouth 2 (two) times daily., Disp: 60 tablet, Rfl: 2   mirabegron ER (MYRBETRIQ) 25 MG TB24 tablet, Take 1 tablet (25 mg total) by mouth daily. (Patient taking differently: Take 25 mg by mouth at bedtime.), Disp: 90 tablet, Rfl: 1   ondansetron (ZOFRAN) 4 MG tablet, Take 1 tablet (4 mg total) by mouth every 6 (six) hours as needed for nausea., Disp: 20 tablet, Rfl: 0   oxyCODONE-acetaminophen (PERCOCET) 10-325 MG tablet, Take 1 tablet by mouth every 8 (eight) hours as needed for pain., Disp: , Rfl:    RESTASIS 0.05 % ophthalmic emulsion, Place 1 drop into both eyes 2 (two) times daily as needed (dry eye)., Disp: , Rfl:    sertraline (ZOLOFT) 50 MG tablet, Take 1 tablet (50 mg total) by mouth daily. (Patient taking differently: Take 50 mg by mouth at bedtime.), Disp: 90 tablet, Rfl: 1  tirzepatide (MOUNJARO) 7.5 MG/0.5ML Pen, Inject 7.5 mg into the skin once a week., Disp: 2 mL, Rfl: 0   Medications ordered in this encounter:  No orders of the defined types were placed in this encounter.    *If you need refills on other medications prior to your next appointment, please contact your pharmacy*  Follow-Up: Call back or seek an in-person evaluation if the symptoms worsen or if the condition fails to improve as anticipated.  [facility] [phone]  Other Instructions Based on what you shared with me, I feel your condition warrants further evaluation as soon as possible at an  Emergency department.       If you are having a true medical emergency please call 911.      Emergency Department-[facility]  Get Driving Directions  [phone]  [address]  [place], [place] [address]  Open 24/7/365      [facility] Emergency Department at [facility]  Get Driving Directions  [address]  [place], [place] [address]  Open 24/7/365    Emergency Department- [facility]  Get Driving Directions  [phone]  [address]  [place], [place] [address]  Open 24/7/365      [facility] at [facility]  Get Driving Directions  [phone]  [address]  [place], [place] [address]  Open 24/7/365    [facility]  Emergency Department- [facility]  Get Driving Directions  [phone]  [address]  [place], [place] [address]  Open 24/7/365    [place]  Emergency Department- [facility]  Get Driving Directions  [address]  [place], [place] [address]  Open 24/7/365    [facility]  Emergency Department- [facility]  Get Driving Directions  [phone]  [address]  [place], [place] [address]  Open 24/7/365      If you have been instructed to have an in-person evaluation today at a local Urgent Care facility, please use the link below. It will take you to a list of all of our available [facility], including address, phone number and hours of operation. Please do not delay care.  [facility]  If you or a family member do not have a primary care provider, use the link below to schedule a visit and establish care. When you choose a [facility] primary care physician or advanced practice provider, you gain a long-term partner in health. Find a Primary Care Provider  Learn more about [facility]'s in-office and virtual care options: [facility] - Get  Care Now

## 2023-11-04 NOTE — Patient Outreach (Signed)
 Care Coordination   Follow Up Visit Note   11/04/2023 Name: Mikeila Burgen MRN: [id number] DOB: [birth year]  Khamani Daniely is a 68 y.o. year old female who sees Gilmore Laroche, FNP for primary care. I spoke with  Gordy Clement by phone today.  What matters to the patients health and wellness today?  Concerned that she may have another UTI and worried that it will progress into sepsis as it did earlier this month.     Goals Addressed             This Visit's Progress    Manage Dizziness and Prevent Falls   On track    Care Coordination Goals: Patient will use walker for balance Patient will move carefully and change positions slowly to minimize risk for falling Patient will seek medical attention at emergency room due to current symptoms that are the same as symptoms that presented prior to recent hospitalization for bacteremia and sepsis due to UTI Extreme dizziness, falls, chills, urinary odor, visual hallucinations, vivid dreams Patient will reach out to RN Care Manager at [phone] with any care coordination or resource needs         SDOH assessments and interventions completed:  Yes  SDOH Interventions Today    Flowsheet Row Most Recent Value  SDOH Interventions   Transportation Interventions Intervention Not Indicated, Patient Resources (Friends/Family)        Care Coordination Interventions:  Yes, provided  Interventions Today    Flowsheet Row Most Recent Value  Chronic Disease   Chronic disease during today's visit Diabetes, Other  [chills, urinary odor, visual hallucinations, vivid dreams, extreme dizziness, falls. Recent hospitalization for bacteremia and sepsis due to UTI.]  General Interventions   General Interventions Discussed/Reviewed General Interventions Discussed, General Interventions Reviewed, Labs, Doctor Visits, Durable Medical Equipment (DME)  Barron Alvine due to C. diff has resolved]  Doctor Visits Discussed/Reviewed Doctor Visits Discussed,  Doctor Visits Reviewed, PCP, Specialist  Durable Medical Equipment (DME) Dan Humphreys, Glucomoter  PCP/Specialist Visits Compliance with follow-up visit  [Will need to schedule a follow-up with PCP. Last one was cancelled due to hospitalization. Patient would like referral to local cardiologist to manage Afib and to discuss referral to [facility] for physical rehab.]  Exercise Interventions   Exercise Discussed/Reviewed Physical Activity, Assistive device use and maintanence  [using walker. Unable to stand or ambulate for long due to increase in dizziness. Staying in the bed mostly.]  Physical Activity Discussed/Reviewed Physical Activity Discussed, Physical Activity Reviewed  Education Interventions   Education Provided Provided Education  Provided Verbal Education On Blood Sugar Monitoring, Sick Day Rules, Exercise, When to see the doctor, Nutrition  [seek medical attention at ED becuse current symptoms are the same one she had prior to hospitalization for bacteremia and sepsis due to UTI. Discussed Urgent Care, ED, televisit, utilizing HHRN to obtain urine specimen for PCP. Decided on ED.]  Nutrition Interventions   Nutrition Discussed/Reviewed Fluid intake, Nutrition Discussed, Nutrition Reviewed  Pharmacy Interventions   Pharmacy Dicussed/Reviewed Pharmacy Topics Discussed, Pharmacy Topics Reviewed, Medications and their functions  [taking medications as prescribed.]  Safety Interventions   Safety Discussed/Reviewed Safety Discussed, Safety Reviewed, Fall Risk, Home Safety  Home Safety Assistive Devices  [HHPT evaluation yesterday. Has not started services yet.]       Follow up plan: Follow up call scheduled for 11/07/23    Encounter Outcome:  Patient Visit Completed   Demetrios Loll, RN, BSN [place]  [facility] RN Care Manager Direct Dial: [phone]  Fax: [phone]

## 2023-11-05 ENCOUNTER — Observation Stay (HOSPITAL_COMMUNITY)
Admission: EM | Admit: 2023-11-05 | Discharge: 2023-11-06 | Disposition: A | Discharge status: Home Health | Attending: Family Medicine | Admitting: Family Medicine

## 2023-11-05 ENCOUNTER — Emergency Department (HOSPITAL_COMMUNITY)

## 2023-11-05 ENCOUNTER — Other Ambulatory Visit: Payer: Self-pay

## 2023-11-05 ENCOUNTER — Encounter (HOSPITAL_COMMUNITY): Payer: Self-pay | Admitting: *Deleted

## 2023-11-05 DIAGNOSIS — Z794 Long term (current) use of insulin: Secondary | ICD-10-CM | POA: Insufficient documentation

## 2023-11-05 DIAGNOSIS — E139 Other specified diabetes mellitus without complications: Secondary | ICD-10-CM | POA: Diagnosis not present

## 2023-11-05 DIAGNOSIS — R531 Weakness: Secondary | ICD-10-CM | POA: Diagnosis present

## 2023-11-05 DIAGNOSIS — F411 Generalized anxiety disorder: Secondary | ICD-10-CM | POA: Diagnosis present

## 2023-11-05 DIAGNOSIS — I10 Essential (primary) hypertension: Secondary | ICD-10-CM | POA: Diagnosis not present

## 2023-11-05 DIAGNOSIS — N390 Urinary tract infection, site not specified: Secondary | ICD-10-CM | POA: Diagnosis not present

## 2023-11-05 DIAGNOSIS — E119 Type 2 diabetes mellitus without complications: Secondary | ICD-10-CM | POA: Diagnosis not present

## 2023-11-05 DIAGNOSIS — E1165 Type 2 diabetes mellitus with hyperglycemia: Secondary | ICD-10-CM

## 2023-11-05 DIAGNOSIS — Z79899 Other long term (current) drug therapy: Secondary | ICD-10-CM | POA: Insufficient documentation

## 2023-11-05 DIAGNOSIS — I480 Paroxysmal atrial fibrillation: Secondary | ICD-10-CM | POA: Diagnosis present

## 2023-11-05 DIAGNOSIS — Z7901 Long term (current) use of anticoagulants: Secondary | ICD-10-CM | POA: Diagnosis not present

## 2023-11-05 DIAGNOSIS — R319 Hematuria, unspecified: Secondary | ICD-10-CM

## 2023-11-05 DIAGNOSIS — R2689 Other abnormalities of gait and mobility: Secondary | ICD-10-CM | POA: Insufficient documentation

## 2023-11-05 DIAGNOSIS — N3281 Overactive bladder: Secondary | ICD-10-CM

## 2023-11-05 LAB — URINALYSIS, ROUTINE W REFLEX MICROSCOPIC
Bilirubin Urine: NEGATIVE
Glucose, UA: NEGATIVE mg/dL
Ketones, ur: NEGATIVE mg/dL
Nitrite: NEGATIVE
Protein, ur: 100 mg/dL — AB
Specific Gravity, Urine: 1.01 (ref 1.005–1.030)
WBC, UA: >50 WBC/hpf (ref 0–5)
pH: 5 (ref 5.0–8.0)

## 2023-11-05 LAB — CBC WITH DIFFERENTIAL/PLATELET
Abs Immature Granulocytes: 0.02 10*3/uL (ref 0.00–0.07)
Basophils Absolute: 0 10*3/uL (ref 0.0–0.1)
Basophils Relative: 1 %
Eosinophils Absolute: 0.4 10*3/uL (ref 0.0–0.5)
Eosinophils Relative: 5 %
HCT: 38.5 % (ref 36.0–46.0)
Hemoglobin: 12.3 g/dL (ref 12.0–15.0)
Immature Granulocytes: 0 %
Lymphocytes Relative: 35 %
Lymphs Abs: 2.8 10*3/uL (ref 0.7–4.0)
MCH: 29.8 pg (ref 26.0–34.0)
MCHC: 31.9 g/dL (ref 30.0–36.0)
MCV: 93.2 fL (ref 80.0–100.0)
Monocytes Absolute: 0.8 10*3/uL (ref 0.1–1.0)
Monocytes Relative: 10 %
Neutro Abs: 4.1 10*3/uL (ref 1.7–7.7)
Neutrophils Relative %: 49 %
Platelets: 235 10*3/uL (ref 150–400)
RBC: 4.13 MIL/uL (ref 3.87–5.11)
RDW: 14.1 % (ref 11.5–15.5)
WBC: 8 10*3/uL (ref 4.0–10.5)
nRBC: 0 % (ref 0.0–0.2)

## 2023-11-05 LAB — COMPREHENSIVE METABOLIC PANEL WITH GFR
ALT: 15 U/L (ref 0–44)
AST: 22 U/L (ref 15–41)
Albumin: 3.2 g/dL — ABNORMAL LOW (ref 3.5–5.0)
Alkaline Phosphatase: 106 U/L (ref 38–126)
Anion gap: 11 (ref 5–15)
BUN: 18 mg/dL (ref 8–23)
CO2: 27 mmol/L (ref 22–32)
Calcium: 9.6 mg/dL (ref 8.9–10.3)
Chloride: 99 mmol/L (ref 98–111)
Creatinine, Ser: 0.92 mg/dL (ref 0.44–1.00)
GFR, Estimated: >60 mL/min (ref >60)
Glucose, Bld: 235 mg/dL — ABNORMAL HIGH (ref 70–99)
Potassium: 3.7 mmol/L (ref 3.5–5.1)
Sodium: 137 mmol/L (ref 135–145)
Total Bilirubin: 0.9 mg/dL (ref 0.0–1.2)
Total Protein: 6.7 g/dL (ref 6.5–8.1)

## 2023-11-05 LAB — GLUCOSE, CAPILLARY
Glucose-Capillary: 117 mg/dL — ABNORMAL HIGH (ref 70–99)
Glucose-Capillary: 176 mg/dL — ABNORMAL HIGH (ref 70–99)

## 2023-11-05 LAB — LACTIC ACID, PLASMA
Lactic Acid, Venous: 1.5 mmol/L (ref 0.5–1.9)
Lactic Acid, Venous: 0.9 mmol/L (ref 0.5–1.9)

## 2023-11-05 MED ORDER — BUSPIRONE HCL 5 MG PO TABS
10.0000 mg | ORAL_TABLET | Freq: Two times a day (BID) | ORAL | Status: DC
  Administered 2023-11-05 – 2023-11-06 (×2): 10 mg via ORAL
  Filled 2023-11-05 (×2): qty 2

## 2023-11-05 MED ORDER — OXYCODONE-ACETAMINOPHEN 10-325 MG PO TABS: 1.0000 | ORAL_TABLET | Freq: Three times a day (TID) | ORAL | Status: DC | PRN

## 2023-11-05 MED ORDER — INSULIN ASPART PROT & ASPART (70-30 MIX) 100 UNIT/ML ~~LOC~~ SUSP
25.0000 [IU] | Freq: Two times a day (BID) | SUBCUTANEOUS | Status: DC
  Filled 2023-11-05: qty 10

## 2023-11-05 MED ORDER — ONDANSETRON HCL 4 MG PO TABS: 4.0000 mg | ORAL_TABLET | Freq: Four times a day (QID) | ORAL | Status: DC | PRN

## 2023-11-05 MED ORDER — ACETAMINOPHEN 650 MG RE SUPP: 650.0000 mg | Freq: Four times a day (QID) | RECTAL | Status: DC | PRN

## 2023-11-05 MED ORDER — SODIUM CHLORIDE 0.9 % IV SOLN: INTRAVENOUS | Status: DC

## 2023-11-05 MED ORDER — FLUCONAZOLE 150 MG PO TABS
150.0000 mg | ORAL_TABLET | Freq: Once | ORAL | Status: AC
  Administered 2023-11-05: 150 mg via ORAL
  Filled 2023-11-05: qty 1

## 2023-11-05 MED ORDER — SERTRALINE HCL 50 MG PO TABS
50.0000 mg | ORAL_TABLET | Freq: Every day | ORAL | Status: DC
  Administered 2023-11-05: 50 mg via ORAL
  Filled 2023-11-05: qty 1

## 2023-11-05 MED ORDER — POLYETHYLENE GLYCOL 3350 17 G PO PACK: 17.0000 g | PACK | Freq: Every day | ORAL | Status: DC | PRN

## 2023-11-05 MED ORDER — ACETAMINOPHEN 325 MG PO TABS: 650.0000 mg | ORAL_TABLET | Freq: Four times a day (QID) | ORAL | Status: DC | PRN

## 2023-11-05 MED ORDER — INSULIN ASPART 100 UNIT/ML IJ SOLN
0.0000 [IU] | Freq: Three times a day (TID) | INTRAMUSCULAR | Status: DC
  Administered 2023-11-06: 5 [IU] via SUBCUTANEOUS

## 2023-11-05 MED ORDER — INSULIN ASPART 100 UNIT/ML IJ SOLN: 0.0000 [IU] | Freq: Every day | INTRAMUSCULAR | Status: DC

## 2023-11-05 MED ORDER — GABAPENTIN 300 MG PO CAPS
300.0000 mg | ORAL_CAPSULE | Freq: Three times a day (TID) | ORAL | Status: DC
  Administered 2023-11-05 – 2023-11-06 (×2): 300 mg via ORAL
  Filled 2023-11-05 (×2): qty 1

## 2023-11-05 MED ORDER — MIRABEGRON ER 25 MG PO TB24
25.0000 mg | ORAL_TABLET | Freq: Every day | ORAL | Status: DC
  Administered 2023-11-05: 25 mg via ORAL
  Filled 2023-11-05: qty 1

## 2023-11-05 MED ORDER — LACTATED RINGERS IV BOLUS (SEPSIS)
1000.0000 mL | Freq: Once | INTRAVENOUS | Status: AC
  Administered 2023-11-05: 1000 mL via INTRAVENOUS

## 2023-11-05 MED ORDER — CYCLOBENZAPRINE HCL 10 MG PO TABS
10.0000 mg | ORAL_TABLET | Freq: Two times a day (BID) | ORAL | Status: DC
  Administered 2023-11-05 – 2023-11-06 (×2): 10 mg via ORAL
  Filled 2023-11-05 (×2): qty 1

## 2023-11-05 MED ORDER — ONDANSETRON HCL 4 MG/2ML IJ SOLN: 4.0000 mg | Freq: Four times a day (QID) | INTRAMUSCULAR | Status: DC | PRN

## 2023-11-05 MED ORDER — OXYCODONE-ACETAMINOPHEN 5-325 MG PO TABS
1.0000 | ORAL_TABLET | Freq: Three times a day (TID) | ORAL | Status: DC | PRN
  Administered 2023-11-05 – 2023-11-06 (×2): 1 via ORAL
  Filled 2023-11-05 (×2): qty 1

## 2023-11-05 MED ORDER — INSULIN ISOPHANE & REGULAR (HUMAN 70-30)100 UNIT/ML KWIKPEN: 25.0000 [IU] | PEN_INJECTOR | Freq: Two times a day (BID) | SUBCUTANEOUS | Status: DC

## 2023-11-05 MED ORDER — METOPROLOL TARTRATE 50 MG PO TABS
50.0000 mg | ORAL_TABLET | Freq: Two times a day (BID) | ORAL | Status: DC
  Administered 2023-11-05: 50 mg via ORAL
  Filled 2023-11-05 (×2): qty 1

## 2023-11-05 MED ORDER — SODIUM CHLORIDE 0.9 % IV SOLN: 1.0000 g | INTRAVENOUS | Status: DC

## 2023-11-05 MED ORDER — OXYCODONE HCL 5 MG PO TABS
5.0000 mg | ORAL_TABLET | Freq: Three times a day (TID) | ORAL | Status: DC | PRN
  Administered 2023-11-06 (×2): 5 mg via ORAL
  Filled 2023-11-05 (×2): qty 1

## 2023-11-05 MED ORDER — SODIUM CHLORIDE 0.9 % IV SOLN
2.0000 g | Freq: Once | INTRAVENOUS | Status: AC
  Administered 2023-11-05: 2 g via INTRAVENOUS
  Filled 2023-11-05: qty 12.5

## 2023-11-05 MED ORDER — APIXABAN 5 MG PO TABS
5.0000 mg | ORAL_TABLET | Freq: Two times a day (BID) | ORAL | Status: DC
  Administered 2023-11-05 – 2023-11-06 (×2): 5 mg via ORAL
  Filled 2023-11-05 (×2): qty 1

## 2023-11-05 MED ORDER — FLUCONAZOLE 100 MG PO TABS: 200.0000 mg | ORAL_TABLET | Freq: Every day | ORAL | Status: DC

## 2023-11-05 MED ORDER — LISINOPRIL 5 MG PO TABS
5.0000 mg | ORAL_TABLET | Freq: Every day | ORAL | Status: DC
  Administered 2023-11-05: 5 mg via ORAL
  Filled 2023-11-05 (×2): qty 1

## 2023-11-05 NOTE — ED Provider Notes (Signed)
 [place] EMERGENCY DEPARTMENT AT [facility] Provider Note   CSN: [id number] Arrival date & time: 11/05/23  1226     History  Chief Complaint  Patient presents with   Dizziness    Amy Livingston is a 68 y.o. female.   Dizziness Associated symptoms: nausea, vomiting and weakness (Generalized)   Patient presents for generalized weakness.  Medical history includes DM, atrial fibrillation, GERD, HTN, anxiety, HLD.  She was hospitalized 4 months ago for sepsis.  She was found to have UTI and bacteremia.  She also tested positive for C. difficile.  She was initially treated with broad-spectrum IV antibiotics, in addition to oral vancomycin.  She was discharged home on IV Ancef with treatment completed on 3/17.  Patient reports that she did have improved symptoms initially but has developed dysuria, dizziness, lightheadedness, and generalized weakness over the past week.  This has led to syncopal episodes.  She did have a fall 2 days ago.  She denies any suspected injuries from the fall.  Her diarrhea has resolved.  She does not have lightheadedness at rest, it is more with standing and ambulation. She has had chills, nausea, vomiting.    Home Medications Prior to Admission medications   Medication Sig Start Date End Date Taking? Authorizing Provider  acetaminophen (TYLENOL) 325 MG tablet Take 2 tablets (650 mg total) by mouth every 6 (six) hours as needed for mild pain (pain score 1-3), headache or fever (or Fever >/= 101). 10/14/23   Shon Hale, MD  atorvastatin (LIPITOR) 80 MG tablet Take 1 tablet (80 mg total) by mouth daily. Patient taking differently: Take 80 mg by mouth at bedtime. 06/07/23   Gilmore Laroche, FNP  Blood Glucose Monitoring Suppl DEVI 1 each by Does not apply route in the morning, at noon, and at bedtime. May substitute to any manufacturer covered by patient's insurance. 06/07/23   Gilmore Laroche, FNP  busPIRone (BUSPAR) 10 MG tablet Take 1 tablet (10 mg  total) by mouth 2 (two) times daily. 06/16/23   Gilmore Laroche, FNP  Cholecalciferol (VITAMIN D-3) 25 MCG (1000 UT) CAPS Take 1,000 Units by mouth 2 (two) times daily at 10 am and 4 pm.    [provider]  cyclobenzaprine (FLEXERIL) 10 MG tablet Take 10 mg by mouth every 12 (twelve) hours.    [provider]  ELIQUIS 5 MG TABS tablet Take 5 mg by mouth 2 (two) times daily.    [provider]  gabapentin (NEURONTIN) 300 MG capsule Take 1 capsule (300 mg total) by mouth 3 (three) times daily. 09/22/23   Gilmore Laroche, FNP  hydrocortisone (ANUSOL-HC) 25 MG suppository Place 1 suppository (25 mg total) rectally every 12 (twelve) hours. 10/14/23 10/13/24  Shon Hale, MD  insulin isophane & regular human KwikPen (NOVOLIN 70/30 KWIKPEN) (70-30) 100 UNIT/ML KwikPen Inject 40 Units into the skin 2 (two) times daily with a meal. 10/22/23   Gilmore Laroche, FNP  lisinopril (ZESTRIL) 5 MG tablet Take 1 tablet (5 mg total) by mouth daily. 06/07/23   Gilmore Laroche, FNP  meclizine (ANTIVERT) 25 MG tablet Take 25 mg by mouth daily as needed for dizziness.    [provider]  metoprolol tartrate (LOPRESSOR) 50 MG tablet Take 1 tablet (50 mg total) by mouth 2 (two) times daily. 10/14/23   Shon Hale, MD  mirabegron ER (MYRBETRIQ) 25 MG TB24 tablet Take 1 tablet (25 mg total) by mouth daily. Patient taking differently: Take 25 mg by mouth at bedtime. 06/16/23  Gilmore Laroche, FNP  ondansetron (ZOFRAN) 4 MG tablet Take 1 tablet (4 mg total) by mouth every 6 (six) hours as needed for nausea. 10/14/23   Shon Hale, MD  oxyCODONE-acetaminophen (PERCOCET) 10-325 MG tablet Take 1 tablet by mouth every 8 (eight) hours as needed for pain.    [provider]  RESTASIS 0.05 % ophthalmic emulsion Place 1 drop into both eyes 2 (two) times daily as needed (dry eye). 02/11/20   [provider]  sertraline (ZOLOFT) 50 MG tablet Take 1 tablet (50 mg total) by mouth  daily. Patient taking differently: Take 50 mg by mouth at bedtime. 06/16/23   Gilmore Laroche, FNP  tirzepatide [facility]) 7.5 MG/0.5ML Pen Inject 7.5 mg into the skin once a week. 10/22/23   Gilmore Laroche, FNP      Allergies    Elemental sulfur and Metformin and related    Review of Systems   Review of Systems  Constitutional:  Positive for activity change, chills and fatigue.  Gastrointestinal:  Positive for abdominal pain, nausea and vomiting.  Genitourinary:  Positive for dysuria and frequency.  Neurological:  Positive for dizziness, syncope, weakness (Generalized) and light-headedness.  All other systems reviewed and are negative.   Physical Exam Updated Vital Signs BP (!) 132/57 (BP Location: Right Arm)   Pulse 93   Temp 98.2 F (36.8 C)   Resp 20   Ht 5\' 6"  (1.676 m)   SpO2 96%   BMI 34.02 kg/m  Physical Exam Vitals and nursing note reviewed.  Constitutional:      General: She is not in acute distress.    Appearance: Normal appearance. She is well-developed. She is not ill-appearing, toxic-appearing or diaphoretic.  HENT:     Head: Normocephalic and atraumatic.     Right Ear: External ear normal.     Left Ear: External ear normal.     Nose: Nose normal.     Mouth/Throat:     Mouth: Mucous membranes are moist.  Eyes:     Extraocular Movements: Extraocular movements intact.     Conjunctiva/sclera: Conjunctivae normal.  Cardiovascular:     Rate and Rhythm: Normal rate and regular rhythm.  Pulmonary:     Effort: Pulmonary effort is normal. No respiratory distress.  Abdominal:     General: There is no distension.     Palpations: Abdomen is soft.     Tenderness: There is no abdominal tenderness.  Musculoskeletal:        General: No swelling. Normal range of motion.     Cervical back: Normal range of motion and neck supple.  Skin:    General: Skin is warm and dry.     Coloration: Skin is not jaundiced or pale.  Neurological:     General: No focal deficit  present.     Mental Status: She is alert and oriented to person, place, and time.  Psychiatric:        Mood and Affect: Mood normal.        Behavior: Behavior normal.     ED Results / Procedures / Treatments   Labs (all labs ordered are listed, but only abnormal results are displayed) Labs Reviewed  URINALYSIS, ROUTINE W REFLEX MICROSCOPIC - Abnormal; Notable for the following components:      Result Value   Color, Urine AMBER (*)    APPearance TURBID (*)    Hgb urine dipstick SMALL (*)    Protein, ur 100 (*)    Leukocytes,Ua LARGE (*)  Bacteria, UA MANY (*)    Non Squamous Epithelial 0-5 (*)    All other components within normal limits  COMPREHENSIVE METABOLIC PANEL WITH GFR - Abnormal; Notable for the following components:   Glucose, Bld 235 (*)    Albumin 3.2 (*)    All other components within normal limits  CULTURE, BLOOD (ROUTINE X 2)  CULTURE, BLOOD (ROUTINE X 2)  URINE CULTURE  LACTIC ACID, PLASMA  CBC WITH DIFFERENTIAL/PLATELET  LACTIC ACID, PLASMA    EKG EKG Interpretation Date/Time:  Saturday November 05 2023 13:17:55 EDT Ventricular Rate:  95 PR Interval:  147 QRS Duration:  86 QT Interval:  368 QTC Calculation: 463 R Axis:   16  Text Interpretation: Sinus rhythm Confirmed by Gloris Manchester [phone]) on 11/05/2023 1:25:30 PM  Radiology DG Chest Port 1 View Result Date: 11/05/2023 CLINICAL DATA:  Dizziness. EXAM: PORTABLE CHEST 1 VIEW COMPARISON:  October 08, 2023. FINDINGS: The heart size and mediastinal contours are within normal limits. Both lungs are clear. The visualized skeletal structures are unremarkable. IMPRESSION: No active disease. Electronically Signed   By: Lupita Raider M.D.   On: 11/05/2023 14:28    Procedures Procedures    Medications Ordered in ED Medications  lactated ringers bolus 1,000 mL (1,000 mLs Intravenous New Bag/Given 11/05/23 1340)  ceFEPIme (MAXIPIME) 2 g in sodium chloride 0.9 % 100 mL IVPB (0 g Intravenous Stopped 11/05/23 1416)   fluconazole (DIFLUCAN) tablet 150 mg (150 mg Oral Given 11/05/23 1336)    ED Course/ Medical Decision Making/ A&P                                 Medical Decision Making Amount and/or Complexity of Data Reviewed Labs: ordered. Radiology: ordered.  Risk Prescription drug management.   This patient presents to the ED for concern of generalized weakness, this involves an extensive number of treatment options, and is a complaint that carries with it a high risk of complications and morbidity.  The differential diagnosis includes infection, dehydration, deconditioning, metabolic derangements   Co morbidities that complicate the patient evaluation  DM, atrial fibrillation, GERD, HTN, anxiety, HLD   Additional history obtained:  Additional history obtained from patient's family External records from outside source obtained and reviewed including EMR   Lab Tests:  I Ordered, and personally interpreted labs.  The pertinent results include: Normal hemoglobin, no leukocytosis, mild hyperglycemia without evidence of DKA, urinalysis shows evidence of bacterial UTI in addition to candiduria   Imaging Studies ordered:  I ordered imaging studies including chest x-ray I independently visualized and interpreted imaging which showed no acute findings I agree with the radiologist interpretation   Cardiac Monitoring: / EKG:  The patient was maintained on a cardiac monitor.  I personally viewed and interpreted the cardiac monitored which showed an underlying rhythm of: Sinus rhythm  Problem List / ED Course / Critical interventions / Medication management  Patient presenting for dysuria, weakness, dizziness, lightheadedness.  This does follow a recent hospitalization for UTI and bacteremia.  On assessment, patient is overall well-appearing.  Although she does endorse some intermittent suprapubic pain, worsened with urination, she denies any currently.  Abdomen is soft and nontender.  Blood  pressure, although soft, is in normal range.  Urinalysis today confirms recurrence of infection.  Antibiotics were ordered.  Patient also has presence of budding yeast on urine.  Will provide Diflucan.  IV fluids were ordered for hydration.  Serum lab work unremarkable.  Patient does not feel comfortable with discharge home at this time.  She states that she is "weak as water".  She is also concerned of the severity of her infection during her prior admission.  Will reach out to hospitalist. I ordered medication including IV fluids for hydration; cefepime for UTI; Diflucan for budding yeast on urinalysis Reevaluation of the patient after these medicines showed that the patient improved I have reviewed the patients home medicines and have made adjustments as needed   Social Determinants of Health:  Lives independently        Final Clinical Impression(s) / ED Diagnoses Final diagnoses:  Urinary tract infection with hematuria, site unspecified  Generalized weakness    Rx / DC Orders ED Discharge Orders     None         Gloris Manchester, MD 11/05/23 1517

## 2023-11-05 NOTE — Assessment & Plan Note (Addendum)
 Uncontrolled.  A1c 12.4-  10/09/23. - SSI- M -Resume home NPH at reduced dose 25 twice daily (dose 40 twice daily) -Hold weekly Mounjaro -Continue gabapentin

## 2023-11-05 NOTE — Assessment & Plan Note (Signed)
 General weakness with dizziness, with fall. -1 L bolus given, continue N/s 75cc/hr x 15hrs - PT eval

## 2023-11-05 NOTE — ED Triage Notes (Signed)
 Pt with continued dizziness. Pt states she believes she has an UTI as well. + burning with urination. Pt states she fell Thursday morning, denies hitting her head. Knee pain since but able to ambulate without difficultly.

## 2023-11-05 NOTE — Assessment & Plan Note (Signed)
 Stable. -Resume lisinopril 5 mg, metoprolol 50 twice daily

## 2023-11-05 NOTE — H&P (Addendum)
 History and Physical    Amy Livingston GMW:[id number] DOB: 23-Jun-1956 DOA: 11/05/2023  PCP: Gilmore Laroche, FNP   Patient coming from: Home  I have personally briefly reviewed patient's old medical records in [facility]  Chief Complaint: Weakness  HPI: Amy Livingston is a 68 y.o. female with medical history significant for diabetes mellitus, hypertension, paroxysmal atrial fibrillation on chronic anticoagulation. Patient presented to the ED with complaints of generalized weakness, and dizziness over the past 2 days.  She also reports burning with urination of 1 day duration.  Sinus is present today when standing, when she is lying in bed she is okay.  2 days ago she felt dizzy while standing and fell.  She did not hit her head.  At baseline she ambulates with a walker, she was not using her walker when she fell.  She reports some balance when using her walker at baseline.  She reports chills yesterday.  1 episode of vomiting 2 days ago.  Recent hospitalization 3/1- 3/7 for sepsis secondary to UTI, C. difficile.  Blood cultures grew staph lugdunensis.  She was treated with oral vancomycin.  Case was discussed with ID.  Patient underwent TTE - 3/5 and TEE 3/6.  PICC line was placed.  Was discharged to complete 2 more weeks of IV cefazolin at home, last dose 3/17.  Patient reports she was compliant with this.  Diarrhea has since resolved.  ED Course: Temperature 98.2.  Heart rate 93-100.  Respiratory 16-20.  Blood pressure systolic 130-122.  O2 sats greater than 96% on room air. UA with large leukocytes many bacteria, budding yeast.  Lactic acid 1.5.  WBC 8.  Chest x-ray negative for acute abnormality. IV cefepime started, Diflucan 150 mg tablet x 1 and 1 L bolus given. Patient reports worsening generalized weakness and dizziness, not comfortable going home.  Hence hospitalization requested.  Review of Systems: As per HPI all other systems reviewed and negative.  Past Medical History:   Diagnosis Date   Anxiety    Atrial fibrillation (HCC)    Diabetes mellitus without complication (HCC)    Dysrhythmia    GERD (gastroesophageal reflux disease)    Hypertension    PONV (postoperative nausea and vomiting)    Shortness of breath dyspnea     Past Surgical History:  Procedure Laterality Date   ABDOMINAL HYSTERECTOMY     ANTERIOR CERVICAL DECOMP/DISCECTOMY FUSION N/A 01/13/2016   Procedure: C5-6 ANTERIOR CERVICAL DECOMPRESSION/DISKECTOMY/FUSION;  Surgeon: Barnett Abu, MD;  Location: [facility];  Service: Neurosurgery;  Laterality: N/A;  C5-6 ANTERIOR CERVICAL DECOMPRESSION/DISKECTOMY/FUSION   BACK SURGERY     x 5   BIOPSY  07/25/2023   Procedure: BIOPSY;  Surgeon: Lanelle Bal, DO;  Location: [facility];  Service: Endoscopy;;   CHOLECYSTECTOMY     COLONOSCOPY WITH PROPOFOL N/A 07/25/2023   Procedure: COLONOSCOPY WITH PROPOFOL;  Surgeon: Lanelle Bal, DO;  Location: [facility];  Service: Endoscopy;  Laterality: N/A;  10:45 am, asa 3   ESOPHAGEAL BRUSHING  07/25/2023   Procedure: ESOPHAGEAL BRUSHING;  Surgeon: Lanelle Bal, DO;  Location: [facility];  Service: Endoscopy;;   ESOPHAGOGASTRODUODENOSCOPY (EGD) WITH PROPOFOL N/A 07/25/2023   Procedure: ESOPHAGOGASTRODUODENOSCOPY (EGD) WITH PROPOFOL;  Surgeon: Lanelle Bal, DO;  Location: [facility];  Service: Endoscopy;  Laterality: N/A;   FOOT SURGERY     KNEE ARTHROSCOPY     left   KNEE SURGERY Right    POLYPECTOMY  07/25/2023   Procedure: POLYPECTOMY  INTESTINAL;  Surgeon: Lanelle Bal, DO;  Location: [facility];  Service: Endoscopy;;   TEE WITHOUT CARDIOVERSION N/A 10/13/2023   Procedure: ECHOCARDIOGRAM, TRANSESOPHAGEAL;  Surgeon: Marjo Bicker, MD;  Location: [facility];  Service: Cardiovascular;  Laterality: N/A;   TUBAL LIGATION       reports that she has never smoked. She has never used smokeless tobacco. She reports that she does not drink alcohol and does not use  drugs.  Allergies  Allergen Reactions   Elemental Sulfur Itching and Rash    Yeast infections   Metformin And Related Diarrhea    GI distress   Family history of hypertension.  Prior to Admission medications   Medication Sig Start Date End Date Taking? Authorizing Provider  atorvastatin (LIPITOR) 80 MG tablet Take 1 tablet (80 mg total) by mouth daily. Patient taking differently: Take 80 mg by mouth at bedtime. 06/07/23  Yes Gilmore Laroche, FNP  cyclobenzaprine (FLEXERIL) 10 MG tablet Take 10 mg by mouth every 12 (twelve) hours.   Yes [provider]  ELIQUIS 5 MG TABS tablet Take 5 mg by mouth 2 (two) times daily.   Yes [provider]  gabapentin (NEURONTIN) 300 MG capsule Take 1 capsule (300 mg total) by mouth 3 (three) times daily. 09/22/23  Yes Gilmore Laroche, FNP  insulin isophane & regular human KwikPen (NOVOLIN 70/30 KWIKPEN) (70-30) 100 UNIT/ML KwikPen Inject 40 Units into the skin 2 (two) times daily with a meal. 10/22/23  Yes Gilmore Laroche, FNP  lisinopril (ZESTRIL) 5 MG tablet Take 1 tablet (5 mg total) by mouth daily. 06/07/23  Yes Gilmore Laroche, FNP  metoprolol tartrate (LOPRESSOR) 50 MG tablet Take 1 tablet (50 mg total) by mouth 2 (two) times daily. 10/14/23  Yes Alis Sawchuk, Courage, MD  mirabegron ER (MYRBETRIQ) 25 MG TB24 tablet Take 1 tablet (25 mg total) by mouth daily. Patient taking differently: Take 25 mg by mouth at bedtime. 06/16/23  Yes Gilmore Laroche, FNP  omeprazole (PRILOSEC) 40 MG capsule Take 40 mg by mouth daily. 10/17/23  Yes [provider]  oxyCODONE-acetaminophen (PERCOCET) 10-325 MG tablet Take 1 tablet by mouth every 8 (eight) hours as needed for pain.   Yes [provider]  sertraline (ZOLOFT) 50 MG tablet Take 1 tablet (50 mg total) by mouth daily. Patient taking differently: Take 50 mg by mouth at bedtime. 06/16/23  Yes Gilmore Laroche, FNP  tirzepatide [facility]) 7.5 MG/0.5ML Pen Inject 7.5 mg into the skin once a  week. 10/22/23  Yes Gilmore Laroche, FNP  acetaminophen (TYLENOL) 325 MG tablet Take 2 tablets (650 mg total) by mouth every 6 (six) hours as needed for mild pain (pain score 1-3), headache or fever (or Fever >/= 101). 10/14/23   Shon Hale, MD  busPIRone (BUSPAR) 10 MG tablet Take 1 tablet (10 mg total) by mouth 2 (two) times daily. 06/16/23   Gilmore Laroche, FNP  Cholecalciferol (VITAMIN D-3) 25 MCG (1000 UT) CAPS Take 1,000 Units by mouth 2 (two) times daily at 10 am and 4 pm.    [provider]  hydrocortisone (ANUSOL-HC) 25 MG suppository Place 1 suppository (25 mg total) rectally every 12 (twelve) hours. 10/14/23 10/13/24  Shon Hale, MD  meclizine (ANTIVERT) 25 MG tablet Take 25 mg by mouth daily as needed for dizziness.    [provider]  ondansetron (ZOFRAN) 4 MG tablet Take 1 tablet (4 mg total) by mouth every 6 (six) hours as needed for nausea. 10/14/23   Shon Hale, MD  RESTASIS 0.05 % ophthalmic emulsion Place 1 drop into both eyes 2 (two) times daily as needed (dry eye). 02/11/20   [provider]    Physical Exam: Vitals:   11/05/23 1243 11/05/23 1247 11/05/23 1320  BP: (!) 121/95 103/70 (!) 132/57  Pulse: 100 93 93  Resp: 16  20  Temp: 98.2 F (36.8 C)    SpO2: 98% 98% 96%  Height: 5\' 6"  (1.676 m)      Constitutional: NAD, calm, comfortable Vitals:   11/05/23 1243 11/05/23 1247 11/05/23 1320  BP: (!) 121/95 103/70 (!) 132/57  Pulse: 100 93 93  Resp: 16  20  Temp: 98.2 F (36.8 C)    SpO2: 98% 98% 96%  Height: 5\' 6"  (1.676 m)     Eyes: PERRL, lids and conjunctivae normal ENMT: Mucous membranes are moist.  Neck: normal, supple, no masses, no thyromegaly Respiratory: clear to auscultation bilaterally, no wheezing, no crackles. Normal respiratory effort. No accessory muscle use.  Cardiovascular: Regular rate and rhythm, no murmurs / rubs / gallops. Abdomen: no tenderness, no masses palpated. No hepatosplenomegaly.    Musculoskeletal: no clubbing / cyanosis. No joint deformity upper and lower extremities.  Skin: no rashes, lesions, ulcers. No induration Neurologic: No facial asymmetry, moving extremity spontaneously, speech fluent Psychiatric: Normal judgment and insight. Alert and oriented x 3. Normal mood.   Labs on Admission: I have personally reviewed following labs and imaging studies  CBC: Recent Labs  Lab 11/05/23 1311  WBC 8.0  NEUTROABS 4.1  HGB 12.3  HCT 38.5  MCV 93.2  PLT 235   Basic Metabolic Panel: Recent Labs  Lab 11/05/23 1311  NA 137  K 3.7  CL 99  CO2 27  GLUCOSE 235*  BUN 18  CREATININE 0.92  CALCIUM 9.6   GFR: CrCl cannot be calculated (Unknown ideal weight.). Liver Function Tests: Recent Labs  Lab 11/05/23 1311  AST 22  ALT 15  ALKPHOS 106  BILITOT 0.9  PROT 6.7  ALBUMIN 3.2*   Urine analysis:    Component Value Date/Time   COLORURINE AMBER (A) 11/05/2023 1238   APPEARANCEUR TURBID (A) 11/05/2023 1238   APPEARANCEUR Cloudy (A) 06/01/2023 1607   LABSPEC 1.010 11/05/2023 1238   PHURINE 5.0 11/05/2023 1238   GLUCOSEU NEGATIVE 11/05/2023 1238   HGBUR SMALL (A) 11/05/2023 1238   BILIRUBINUR NEGATIVE 11/05/2023 1238   BILIRUBINUR negative 08/17/2023 1933   BILIRUBINUR Negative 06/01/2023 1607   KETONESUR NEGATIVE 11/05/2023 1238   PROTEINUR 100 (A) 11/05/2023 1238   UROBILINOGEN 0.2 08/17/2023 1933   NITRITE NEGATIVE 11/05/2023 1238   LEUKOCYTESUR LARGE (A) 11/05/2023 1238    Radiological Exams on Admission: DG Chest Port 1 View Result Date: 11/05/2023 CLINICAL DATA:  Dizziness. EXAM: PORTABLE CHEST 1 VIEW COMPARISON:  October 08, 2023. FINDINGS: The heart size and mediastinal contours are within normal limits. Both lungs are clear. The visualized skeletal structures are unremarkable. IMPRESSION: No active disease. Electronically Signed   By: Lupita Raider M.D.   On: 11/05/2023 14:28    EKG: Independently reviewed.  Sinus rhythm, rate 95, QTc  463.  No significant change from prior.  Assessment/Plan Principal Problem:   Generalized weakness Active Problems:   UTI (urinary tract infection)   Diabetes 1.5, managed as type 2 (HCC)   Hypertension   Generalized anxiety disorder   Paroxysmal atrial fibrillation (HCC)  Assessment and Plan: * Generalized weakness General weakness with dizziness, with fall. -1 L bolus given, continue N/s 75cc/hr x 15hrs -  PT eval  UTI (urinary tract infection) Symptomatic with dysuria.  Rules out for sepsis.  WBC 8.  Lactic acid 1.5.  Recent hospitalization for UTI bacteremia with sepsis.  Blood cultures grew sensitive Staph Lugdunensis.  Urine cultures were without growth.  TEE, TTE negative for vegetations.  2-week course of IV antibiotics on discharge with IV Keflex. -IV cefepime given in ED, continue with ceftriaxone 1 g daily -Follow-up blood cultures ordered in ED -Follow-up urine cultures -UA today also showing budding yeast, 150mg  oral Diflucan x 1 given in ED, continue 200 mg daily   Paroxysmal atrial fibrillation (HCC) Rate controlled and on anticoagulation. -Resume metoprolol and Eliquis.   Generalized anxiety disorder Resume buspirone, Zoloft  Hypertension Stable. -Resume lisinopril 5 mg, metoprolol 50 twice daily  Diabetes 1.5, managed as type 2 (HCC) Uncontrolled.  A1c 12.4-  10/09/23. - SSI- M -Resume home NPH at reduced dose 25 twice daily (dose 40 twice daily) -Hold weekly Mounjaro -Continue gabapentin   DVT prophylaxis: Eliquis Code Status: Full code Family Communication: Daughter-in-law Kenney Houseman at bedside. Disposition Plan:  ~ 2 days Consults called:  None Admission status:  Obs tele    Author: Onnie Boer, MD 11/05/2023 5:39 PM  For on call review [email or web address].

## 2023-11-05 NOTE — Assessment & Plan Note (Signed)
 Rate controlled and on anticoagulation. -Resume metoprolol and Eliquis.

## 2023-11-05 NOTE — Assessment & Plan Note (Signed)
Resume buspirone, Zoloft

## 2023-11-05 NOTE — Assessment & Plan Note (Addendum)
 Symptomatic with dysuria.  Rules out for sepsis.  WBC 8.  Lactic acid 1.5.  Recent hospitalization for UTI bacteremia with sepsis.  Blood cultures grew sensitive Staph Lugdunensis.  Urine cultures were without growth.  TEE, TTE negative for vegetations.  2-week course of IV antibiotics on discharge with IV Keflex. -IV cefepime given in ED, continue with ceftriaxone 1 g daily -Follow-up blood cultures ordered in ED -Follow-up urine cultures -UA today also showing budding yeast, 150mg  oral Diflucan x 1 given in ED, continue 200 mg daily

## 2023-11-06 DIAGNOSIS — R319 Hematuria, unspecified: Secondary | ICD-10-CM | POA: Diagnosis not present

## 2023-11-06 DIAGNOSIS — R531 Weakness: Secondary | ICD-10-CM | POA: Diagnosis not present

## 2023-11-06 DIAGNOSIS — N390 Urinary tract infection, site not specified: Secondary | ICD-10-CM | POA: Diagnosis not present

## 2023-11-06 DIAGNOSIS — I480 Paroxysmal atrial fibrillation: Secondary | ICD-10-CM | POA: Diagnosis not present

## 2023-11-06 LAB — CBC
HCT: 32 % — ABNORMAL LOW (ref 36.0–46.0)
Hemoglobin: 10.1 g/dL — ABNORMAL LOW (ref 12.0–15.0)
MCH: 29.7 pg (ref 26.0–34.0)
MCHC: 31.6 g/dL (ref 30.0–36.0)
MCV: 94.1 fL (ref 80.0–100.0)
Platelets: 187 10*3/uL (ref 150–400)
RBC: 3.4 MIL/uL — ABNORMAL LOW (ref 3.87–5.11)
RDW: 14.1 % (ref 11.5–15.5)
WBC: 6.9 10*3/uL (ref 4.0–10.5)
nRBC: 0 % (ref 0.0–0.2)

## 2023-11-06 LAB — BASIC METABOLIC PANEL WITH GFR
Anion gap: 8 (ref 5–15)
BUN: 15 mg/dL (ref 8–23)
CO2: 26 mmol/L (ref 22–32)
Calcium: 8.7 mg/dL — ABNORMAL LOW (ref 8.9–10.3)
Chloride: 104 mmol/L (ref 98–111)
Creatinine, Ser: 0.67 mg/dL (ref 0.44–1.00)
GFR, Estimated: >60 mL/min (ref >60)
Glucose, Bld: 213 mg/dL — ABNORMAL HIGH (ref 70–99)
Potassium: 3.4 mmol/L — ABNORMAL LOW (ref 3.5–5.1)
Sodium: 138 mmol/L (ref 135–145)

## 2023-11-06 LAB — GLUCOSE, CAPILLARY
Glucose-Capillary: 211 mg/dL — ABNORMAL HIGH (ref 70–99)
Glucose-Capillary: 100 mg/dL — ABNORMAL HIGH (ref 70–99)

## 2023-11-06 MED ORDER — POTASSIUM CHLORIDE CRYS ER 20 MEQ PO TBCR
40.0000 meq | EXTENDED_RELEASE_TABLET | Freq: Once | ORAL | Status: AC
  Administered 2023-11-06: 40 meq via ORAL
  Filled 2023-11-06: qty 2

## 2023-11-06 MED ORDER — METOPROLOL TARTRATE 50 MG PO TABS: 25.0000 mg | ORAL_TABLET | Freq: Two times a day (BID) | ORAL | Status: DC

## 2023-11-06 MED ORDER — MIRABEGRON ER 25 MG PO TB24: 25.0000 mg | ORAL_TABLET | Freq: Every day | ORAL | Status: DC

## 2023-11-06 MED ORDER — INSULIN ASPART PROT & ASPART (70-30 MIX) 100 UNIT/ML ~~LOC~~ SUSP
30.0000 [IU] | Freq: Two times a day (BID) | SUBCUTANEOUS | Status: DC
  Administered 2023-11-06: 30 [IU] via SUBCUTANEOUS
  Filled 2023-11-06: qty 10

## 2023-11-06 MED ORDER — MECLIZINE HCL 12.5 MG PO TABS: 25.0000 mg | ORAL_TABLET | Freq: Every day | ORAL | Status: DC | PRN

## 2023-11-06 MED ORDER — SODIUM CHLORIDE 0.9 % IV SOLN: 2.0000 g | INTRAVENOUS | Status: DC

## 2023-11-06 MED ORDER — LISINOPRIL 5 MG PO TABS: 2.5000 mg | ORAL_TABLET | Freq: Every day | ORAL | Status: DC

## 2023-11-06 MED ORDER — SERTRALINE HCL 50 MG PO TABS: 50.0000 mg | ORAL_TABLET | Freq: Every day | ORAL | Status: DC

## 2023-11-06 MED ORDER — FOSFOMYCIN TROMETHAMINE 3 G PO PACK
3.0000 g | PACK | Freq: Once | ORAL | Status: AC
  Administered 2023-11-06: 3 g via ORAL
  Filled 2023-11-06: qty 3

## 2023-11-06 MED ORDER — TIRZEPATIDE 7.5 MG/0.5ML ~~LOC~~ SOAJ: 7.5000 mg | SUBCUTANEOUS | Status: DC

## 2023-11-06 MED ORDER — FLUCONAZOLE 100 MG PO TABS
200.0000 mg | ORAL_TABLET | Freq: Every day | ORAL | Status: AC
  Administered 2023-11-06: 200 mg via ORAL
  Filled 2023-11-06: qty 2

## 2023-11-06 MED ORDER — ATORVASTATIN CALCIUM 80 MG PO TABS: 80.0000 mg | ORAL_TABLET | Freq: Every day | ORAL | Status: DC

## 2023-11-06 NOTE — Hospital Course (Signed)
 68 y.o. female with medical history significant for diabetes mellitus, hypertension, paroxysmal atrial fibrillation on chronic anticoagulation.  Patient presented to the ED with complaints of generalized weakness, and dizziness over the past 2 days.  She also reports burning with urination of 1 day duration.  Sinus is present today when standing, when she is lying in bed she is okay.  2 days ago she felt dizzy while standing and fell.  She did not hit her head.  At baseline she ambulates with a walker, she was not using her walker when she fell.  She reports some balance when using her walker at baseline.  She reports chills yesterday.  1 episode of vomiting 2 days ago.   Recent hospitalization 3/1- 3/7 for sepsis secondary to UTI, C. difficile.  Blood cultures grew staph lugdunensis.  She was treated with oral vancomycin.  Case was discussed with ID.  Patient underwent TTE - 3/5 and TEE 3/6.  PICC line was placed.  Was discharged to complete 2 more weeks of IV cefazolin at home, last dose 3/17.  Patient reports she was compliant with this.  Diarrhea has since resolved.   Patient reported worsening generalized weakness and dizziness, not comfortable going home.  Hence hospitalization requested.  Pt was placed in observation.

## 2023-11-06 NOTE — Progress Notes (Signed)
   11/06/23 0800  TOC Brief Assessment  Home environment has been reviewed Single Family Home  Prior level of function: Indpenedent  Prior/Current Home Services No current home services  Readmission risk has been reviewed Yes  Transition of care needs no transition of care needs at this time   Transition of Care Department [facility]) has reviewed patient and no TOC needs have been identified at this time. We will continue to monitor patient advancement through interdisciplinary progression rounds. If new patient transition needs arise, please place a TOC consult.

## 2023-11-06 NOTE — Evaluation (Signed)
 Physical Therapy Evaluation Patient Details Name: Amy Livingston MRN: [id number] DOB: 03-Apr-1956 Today's Date: 11/06/2023  History of Present Illness  Amy Livingston is a 68 y.o. female with medical history significant for diabetes mellitus, hypertension, paroxysmal atrial fibrillation on chronic anticoagulation.  Patient presented to the ED with complaints of generalized weakness, and dizziness over the past 2 days.  She also reports burning with urination of 1 day duration.  Sinus is present today when standing, when she is lying in bed she is okay.  2 days ago she felt dizzy while standing and fell.  She did not hit her head.  At baseline she ambulates with a walker, she was not using her walker when she fell.  She reports some balance when using her walker at baseline.  She reports chills yesterday.  1 episode of vomiting 2 days ago.     Recent hospitalization 3/1- 3/7 for sepsis secondary to UTI, C. difficile.  Blood cultures grew staph lugdunensis.  She was treated with oral vancomycin.  Case was discussed with ID.  Patient underwent TTE - 3/5 and TEE 3/6.  PICC line was placed.  Was discharged to complete 2 more weeks of IV cefazolin at home, last dose 3/17.  Patient reports she was compliant with this.  Diarrhea has since resolved.   Clinical Impression  Patient demonstrates decreased LE strength, increased time required for bed mobility, functional transfers, and ambulatoin. Patient also demonstrates increased difficulty with standing up from commode at this time but is able to complete with use of handrail on second attempt. Patient able to initiate gait training with RW and supervision. Patient would continue to benefit from skilled acute physical therapy for increased endurance with ambulation, increased LE strength, and balance for improved quality of life, improved independence with gait training and continued progress towards therapy goals.         If plan is discharge home, recommend the  following: A little help with walking and/or transfers;A little help with bathing/dressing/bathroom;Help with stairs or ramp for entrance;Assist for transportation   Can travel by private vehicle        Equipment Recommendations BSC/3in1  Recommendations for Other Services       Functional Status Assessment Patient has had a recent decline in their functional status and demonstrates the ability to make significant improvements in function in a reasonable and predictable amount of time.     Precautions / Restrictions Precautions Precautions: Fall Recall of Precautions/Restrictions: Intact Restrictions Weight Bearing Restrictions Per Provider Order: No      Mobility  Bed Mobility Overal bed mobility: Modified Independent             General bed mobility comments: pt utilized elevated HOB and railing, increased time required    Transfers Overall transfer level: Modified independent Equipment used: Rolling walker (2 wheels)               General transfer comment: Pt requires extra attempt to complete standing up from commode with grab bar. Otherwise pt modified independent with use of RW and increased time allowance.    Ambulation/Gait Ambulation/Gait assistance: Modified independent (Device/Increase time), Supervision Gait Distance (Feet): 65 Feet Assistive device: Rolling walker (2 wheels) Gait Pattern/deviations: WFL(Within Functional Limits), Decreased step length - left, Decreased stance time - right, Decreased stride length Gait velocity: decreased     General Gait Details: cautious not to fall, decreased speed with turning, no LOB, pt reports some dizziness and increased wobbling with increased distance  Stairs  Wheelchair Mobility     Tilt Bed    Modified Rankin (Stroke Patients Only)       Balance Overall balance assessment: Needs assistance Sitting-balance support: No upper extremity supported, Feet unsupported Sitting  balance-Leahy Scale: Good Sitting balance - Comments: normal   Standing balance support: Bilateral upper extremity supported Standing balance-Leahy Scale: Good Standing balance comment: pt able to demonstrate ability to occassionally perform short transfer distances without RW with no LOB                             Pertinent Vitals/Pain Pain Assessment Pain Assessment: 0-10 Pain Score: 6  Pain Location: back and legs, and right knee with cyst like legion Pain Intervention(s): Limited activity within patient's tolerance, Repositioned    Home Living Family/patient expects to be discharged to:: Private residence Living Arrangements: Children Available Help at Discharge: Family;Available 24 hours/day Type of Home: Mobile home (pt has recently moved in with son and family due to pts apartment having too many stairs.) Home Access: Stairs to enter Entrance Stairs-Rails: Right Entrance Stairs-Number of Steps: 4   Home Layout: One level Home Equipment: Agricultural consultant (2 wheels);Cane - single point      Prior Function Prior Level of Function : Independent/Modified Independent;History of Falls (last six months)             Mobility Comments: household ambulator, was currently weaning off of walker inside ADLs Comments: occassional assistance from family     Extremity/Trunk Assessment   Upper Extremity Assessment Upper Extremity Assessment: Overall WFL for tasks assessed    Lower Extremity Assessment Lower Extremity Assessment: Overall WFL for tasks assessed;Generalized weakness    Cervical / Trunk Assessment Cervical / Trunk Assessment: Kyphotic  Communication   Communication Communication: No apparent difficulties    Cognition Arousal: Alert Behavior During Therapy: WFL for tasks assessed/performed   PT - Cognitive impairments: No apparent impairments                         Following commands: Intact       Cueing Cueing Techniques:  Verbal cues, Visual cues     General Comments      Exercises     Assessment/Plan    PT Assessment Patient needs continued PT services  PT Problem List Decreased strength;Decreased activity tolerance;Decreased balance;Decreased mobility       PT Treatment Interventions DME instruction;Gait training;Stair training;Functional mobility training;Therapeutic activities;Therapeutic exercise;Balance training;Neuromuscular re-education;Patient/family education    PT Goals (Current goals can be found in the Care Plan section)  Acute Rehab PT Goals Patient Stated Goal: to get stronger PT Goal Formulation: With patient Time For Goal Achievement: 11/20/23 Potential to Achieve Goals: Good    Frequency Min 3X/week     Co-evaluation               AM-PAC PT "6 Clicks" Mobility  Outcome Measure Help needed turning from your back to your side while in a flat bed without using bedrails?: A Little Help needed moving from lying on your back to sitting on the side of a flat bed without using bedrails?: A Little Help needed moving to and from a bed to a chair (including a wheelchair)?: None Help needed standing up from a chair using your arms (e.g., wheelchair or bedside chair)?: None Help needed to walk in hospital room?: None Help needed climbing 3-5 steps with a railing? : A Lot 6 Click  Score: 20    End of Session Equipment Utilized During Treatment: Gait belt Activity Tolerance: Patient tolerated treatment well;Patient limited by fatigue;Patient limited by pain Patient left: in chair;with call bell/phone within reach;with chair alarm set Nurse Communication: Mobility status;Precautions PT Visit Diagnosis: Other abnormalities of gait and mobility (R26.89);History of falling (Z91.81);Muscle weakness (generalized) (M62.81)    Time: 4098-1191 PT Time Calculation (min) (ACUTE ONLY): 29 min   Charges:   PT Evaluation $PT Eval Low Complexity: 1 Low PT Treatments $Therapeutic  Exercise: 8-22 mins PT General Charges $$ ACUTE PT VISIT: 1 Visit         Luz Lex, PT, DPT [facility] Office: [phone] 9:00 AM, 11/06/23

## 2023-11-06 NOTE — Progress Notes (Signed)
   11/06/23 1045  TOC Assessment  Once discharged, how will the patient get to their discharge location? Self/Private Vehicle  Final next level of care Home w Home Health Services  Barriers to Discharge No Barriers Identified  Patient states their goals for this hospitalization and ongoing recovery are: Get well  Living arrangements for the past 2 months Single Family Home  Current home services Home PT  DME Arranged Bedside commode  DME Agency Other - Comment (Patient provided prescription for bedside commode.)  HH Arranged PT  HH Agency  ([facility])  Date [facility] Agency Contacted 11/06/23  Time HH Agency Contacted 1050  Representative spoke with at [facility] Herbert Seta  Do you feel safe going back to the place where you live? Y  Appearance: Appears stated age  Attitude/Demeanor/Rapport Engaged  Affect (typically observed) Calm  Orientation:  Oriented to Self;Oriented to Place;Oriented to  Time;Oriented to Situation   CM met with patient at bedside. Patient reported she has home health PT with [facility]. Patient report she will get  bedside commode prescription provided.

## 2023-11-06 NOTE — Discharge Summary (Signed)
 Physician Discharge Summary  Amy Livingston AVW:[id number] DOB: [birth year] DOA: 11/05/2023  PCP: Gilmore Laroche, FNP  Admit date: 11/05/2023 Discharge date: 11/06/2023  Admitted From:  Home  Disposition: Home   Recommendations for Outpatient Follow-up:  Follow up with PCP in 1 weeks for blood pressure check Monitor BP at home and bring readings to office visit with PCP  Home Health:  PT   Discharge Condition: STABLE   CODE STATUS: FULL DIET: carb modified    Brief Hospitalization Summary: Please see all hospital notes, images, labs for full details of the hospitalization. Admission provider HPI:  68 y.o. female with medical history significant for diabetes mellitus, hypertension, paroxysmal atrial fibrillation on chronic anticoagulation.  Patient presented to the ED with complaints of generalized weakness, and dizziness over the past 2 days.  She also reports burning with urination of 1 day duration.  Sinus is present today when standing, when she is lying in bed she is okay.  2 days ago she felt dizzy while standing and fell.  She did not hit her head.  At baseline she ambulates with a walker, she was not using her walker when she fell.  She reports some balance when using her walker at baseline.  She reports chills yesterday.  1 episode of vomiting 2 days ago.   Recent hospitalization 3/1- 3/7 for sepsis secondary to UTI, C. difficile.  Blood cultures grew staph lugdunensis.  She was treated with oral vancomycin.  Case was discussed with ID.  Patient underwent TTE - 3/5 and TEE 3/6.  PICC line was placed.  Was discharged to complete 2 more weeks of IV cefazolin at home, last dose 3/17.  Patient reports she was compliant with this.  Diarrhea has since resolved.   Patient reported worsening generalized weakness and dizziness, not comfortable going home.  Hence hospitalization requested.  Pt was placed in observation.  Hospital Course Pt was admitted for observation due to weakness and  dizziness secondary to presumed UTI and dehydration.  She felt much better after IV fluids.  Her UTI was fully treated with fosfomycin 11/06/23. She was also seen by PT and they have recommended ongoing home health PT.  We noticed she was having soft BPs on her home BP regimen. We have recommended for her to reduce the doses in half of her metoprolol and lisinopril and monitor BP at home and write down home BP readings. Follow up with PCP to recheck blood pressure in 1-2 weeks and decide about the medication dosing needed.  Pt verbalized understanding.   Discharge Diagnoses:  Principal Problem:   Generalized weakness Active Problems:   Diabetes 1.5, managed as type 2 (HCC)   Hypertension   Generalized anxiety disorder   Paroxysmal atrial fibrillation (HCC)   UTI (urinary tract infection)   Discharge Instructions:  Allergies as of 11/06/2023       Reactions   Elemental Sulfur Itching, Rash   Yeast infections   Metformin And Related Diarrhea   GI distress        Medication List     TAKE these medications    acetaminophen 325 MG tablet Commonly known as: TYLENOL Take 2 tablets (650 mg total) by mouth every 6 (six) hours as needed for mild pain (pain score 1-3), headache or fever (or Fever >/= 101).   atorvastatin 80 MG tablet Commonly known as: LIPITOR Take 1 tablet (80 mg total) by mouth at bedtime.   busPIRone 10 MG tablet Commonly known as: BUSPAR Take 1 tablet (10  mg total) by mouth 2 (two) times daily.   cyclobenzaprine 10 MG tablet Commonly known as: FLEXERIL Take 10 mg by mouth every 12 (twelve) hours.   Eliquis 5 MG Tabs tablet Generic drug: apixaban Take 5 mg by mouth 2 (two) times daily.   gabapentin 300 MG capsule Commonly known as: NEURONTIN Take 1 capsule (300 mg total) by mouth 3 (three) times daily.   lisinopril 5 MG tablet Commonly known as: ZESTRIL Take 0.5 tablets (2.5 mg total) by mouth daily. Start taking on: November 07, 2023 What changed: how  much to take   meclizine 25 MG tablet Commonly known as: ANTIVERT Take 25 mg by mouth daily as needed for dizziness.   metoprolol tartrate 50 MG tablet Commonly known as: LOPRESSOR Take 0.5 tablets (25 mg total) by mouth 2 (two) times daily. What changed: how much to take   mirabegron ER 25 MG Tb24 tablet Commonly known as: Myrbetriq Take 1 tablet (25 mg total) by mouth at bedtime.   NovoLIN 70/30 Kwikpen (70-30) 100 UNIT/ML KwikPen Generic drug: insulin isophane & regular human KwikPen Inject 40 Units into the skin 2 (two) times daily with a meal.   omeprazole 40 MG capsule Commonly known as: PRILOSEC Take 40 mg by mouth daily.   ondansetron 4 MG tablet Commonly known as: ZOFRAN Take 1 tablet (4 mg total) by mouth every 6 (six) hours as needed for nausea.   oxyCODONE-acetaminophen 10-325 MG tablet Commonly known as: PERCOCET Take 1 tablet by mouth every 8 (eight) hours as needed for pain.   sertraline 50 MG tablet Commonly known as: ZOLOFT Take 1 tablet (50 mg total) by mouth at bedtime.   tirzepatide 7.5 MG/0.5ML Pen Commonly known as: MOUNJARO Inject 7.5 mg into the skin every Tuesday. Start taking on: November 08, 2023   Vitamin D-3 25 MCG (1000 UT) Caps Take 1,000 Units by mouth 2 (two) times daily at 10 am and 4 pm.               Durable Medical Equipment  (From admission, onward)           Start     Ordered   11/06/23 0904  For home use only DME 3 n 1  Once        11/06/23 0903   11/06/23 0903  For home use only DME Bedside commode  Once       Question Answer Comment  Patient needs a bedside commode to treat with the following condition Gait instability   Patient needs a bedside commode to treat with the following condition Generalized weakness      11/06/23 1191            Follow-up Information     Gilmore Laroche, FNP. Schedule an appointment as soon as possible for a visit in 1 week(s).   Specialty: Family Medicine Why: Hospital  Follow Up Contact information: [address] [place] [address] [phone]                Allergies  Allergen Reactions   Elemental Sulfur Itching and Rash    Yeast infections   Metformin And Related Diarrhea    GI distress   Allergies as of 11/06/2023       Reactions   Elemental Sulfur Itching, Rash   Yeast infections   Metformin And Related Diarrhea   GI distress        Medication List     TAKE these medications  acetaminophen 325 MG tablet Commonly known as: TYLENOL Take 2 tablets (650 mg total) by mouth every 6 (six) hours as needed for mild pain (pain score 1-3), headache or fever (or Fever >/= 101).   atorvastatin 80 MG tablet Commonly known as: LIPITOR Take 1 tablet (80 mg total) by mouth at bedtime.   busPIRone 10 MG tablet Commonly known as: BUSPAR Take 1 tablet (10 mg total) by mouth 2 (two) times daily.   cyclobenzaprine 10 MG tablet Commonly known as: FLEXERIL Take 10 mg by mouth every 12 (twelve) hours.   Eliquis 5 MG Tabs tablet Generic drug: apixaban Take 5 mg by mouth 2 (two) times daily.   gabapentin 300 MG capsule Commonly known as: NEURONTIN Take 1 capsule (300 mg total) by mouth 3 (three) times daily.   lisinopril 5 MG tablet Commonly known as: ZESTRIL Take 0.5 tablets (2.5 mg total) by mouth daily. Start taking on: November 07, 2023 What changed: how much to take   meclizine 25 MG tablet Commonly known as: ANTIVERT Take 25 mg by mouth daily as needed for dizziness.   metoprolol tartrate 50 MG tablet Commonly known as: LOPRESSOR Take 0.5 tablets (25 mg total) by mouth 2 (two) times daily. What changed: how much to take   mirabegron ER 25 MG Tb24 tablet Commonly known as: Myrbetriq Take 1 tablet (25 mg total) by mouth at bedtime.   NovoLIN 70/30 Kwikpen (70-30) 100 UNIT/ML KwikPen Generic drug: insulin isophane & regular human KwikPen Inject 40 Units into the skin 2 (two) times daily with a meal.    omeprazole 40 MG capsule Commonly known as: PRILOSEC Take 40 mg by mouth daily.   ondansetron 4 MG tablet Commonly known as: ZOFRAN Take 1 tablet (4 mg total) by mouth every 6 (six) hours as needed for nausea.   oxyCODONE-acetaminophen 10-325 MG tablet Commonly known as: PERCOCET Take 1 tablet by mouth every 8 (eight) hours as needed for pain.   sertraline 50 MG tablet Commonly known as: ZOLOFT Take 1 tablet (50 mg total) by mouth at bedtime.   tirzepatide 7.5 MG/0.5ML Pen Commonly known as: MOUNJARO Inject 7.5 mg into the skin every Tuesday. Start taking on: November 08, 2023   Vitamin D-3 25 MCG (1000 UT) Caps Take 1,000 Units by mouth 2 (two) times daily at 10 am and 4 pm.               Durable Medical Equipment  (From admission, onward)           Start     Ordered   11/06/23 0904  For home use only DME 3 n 1  Once        11/06/23 0903   11/06/23 0903  For home use only DME Bedside commode  Once       Question Answer Comment  Patient needs a bedside commode to treat with the following condition Gait instability   Patient needs a bedside commode to treat with the following condition Generalized weakness      11/06/23 8119            Procedures/Studies: DG Chest Port 1 View Result Date: 11/05/2023 CLINICAL DATA:  Dizziness. EXAM: PORTABLE CHEST 1 VIEW COMPARISON:  October 08, 2023. FINDINGS: The heart size and mediastinal contours are within normal limits. Both lungs are clear. The visualized skeletal structures are unremarkable. IMPRESSION: No active disease. Electronically Signed   By: Lupita Raider M.D.   On: 11/05/2023 14:28   [place]  EKG SITE RITE Result Date: 10/14/2023 If Site Rite image not attached, placement could not be confirmed due to current cardiac rhythm.  ECHO TEE Result Date: 10/13/2023    TRANSESOPHOGEAL ECHO REPORT   Patient Name:   [facility] Date of Exam: 10/13/2023 Medical Rec #:  [id number]      Height:       66.0 in Accession #:     [id number]     Weight:       210.8 lb Date of Birth:  [birth year]       BSA:          2.045 m Patient Age:    68 years       BP:           123/57 mmHg Patient Gender: F              HR:           89 bpm. [facility] Procedure: Transesophageal Echo, Cardiac Doppler and Color Doppler (Both            Spectral and Color Flow Doppler were utilized during procedure). Indications:    Bacteremia R78.81  History:        Patient has prior history of Echocardiogram examinations, most                 recent 10/12/2023. Arrythmias:Atrial Fibrillation,                 Signs/Symptoms:Syncope; Risk Factors:Hypertension, Diabetes and                 Dyslipidemia.  Sonographer:    Celesta Gentile RCS Referring Phys: [id number] Ellsworth Lennox PROCEDURE: The transesophogeal probe was passed without difficulty through the esophogus of the patient. Imaged were obtained with the patient in a left lateral decubitus position. Sedation performed by different physician. Image quality was good. The patient's vital signs; including heart rate, blood pressure, and oxygen saturation; remained stable throughout the procedure. Supplementary images were obtained from transthoracic windows as indicated to answer the clinical question. The patient developed no complications during the procedure.  IMPRESSIONS  1. Left ventricular ejection fraction, by estimation, is 55 to 60%. The left ventricle has normal function. The left ventricle has no regional wall motion abnormalities.  2. Right ventricular systolic function is normal. The right ventricular size is normal.  3. No left atrial/left atrial appendage thrombus was detected. The LAA emptying velocity was 65 cm/s.  4. The mitral valve is normal in structure. Trivial mitral valve regurgitation. No evidence of mitral stenosis.  5. The aortic valve is tricuspid. Aortic valve regurgitation is not visualized. No aortic stenosis is present.  6. Not performed and demonstrates Not performed.  Conclusion(s)/Recommendation(s): No evidence of vegetation/infective endocarditis on this transesophageael echocardiogram. FINDINGS  Left Ventricle: Left ventricular ejection fraction, by estimation, is 55 to 60%. The left ventricle has normal function. The left ventricle has no regional wall motion abnormalities. Strain was performed and the global longitudinal strain is indeterminate. The left ventricular internal cavity size was normal in size. There is no left ventricular hypertrophy. Right Ventricle: The right ventricular size is normal. No increase in right ventricular wall thickness. Right ventricular systolic function is normal. Left Atrium: Left atrial size was normal in size. No left atrial/left atrial appendage thrombus was detected. The LAA emptying velocity was 65 cm/s. Right Atrium: Right atrial size was normal in size. Pericardium: There is no evidence of pericardial effusion. Mitral Valve:  The mitral valve is normal in structure. Trivial mitral valve regurgitation. No evidence of mitral valve stenosis. Tricuspid Valve: The tricuspid valve is normal in structure. Tricuspid valve regurgitation is trivial. No evidence of tricuspid stenosis. Aortic Valve: The aortic valve is tricuspid. Aortic valve regurgitation is not visualized. No aortic stenosis is present. Pulmonic Valve: The pulmonic valve was normal in structure. Pulmonic valve regurgitation is not visualized. No evidence of pulmonic stenosis. Aorta: Aortic root could not be assessed. Venous: The inferior vena cava was not well visualized. IAS/Shunts: No atrial level shunt detected by color flow Doppler. Additional Comments: 3D was performed not requiring image post processing on an independent workstation and was indeterminate. Spectral Doppler performed. Vishnu Priya Mallipeddi Electronically signed by Winfield Rast Mallipeddi Signature Date/Time: 10/13/2023/3:45:26 PM    Final    ECHOCARDIOGRAM COMPLETE Result Date: 10/12/2023    ECHOCARDIOGRAM  REPORT   Patient Name:   [facility] Date of Exam: 10/12/2023 Medical Rec #:  [id number]      Height:       66.0 in Accession #:    [id number]     Weight:       210.8 lb Date of Birth:  [birth year]       BSA:          2.045 m Patient Age:    68 years       BP:           129/82 mmHg Patient Gender: F              HR:           89 bpm. [facility] Procedure: 2D Echo, Cardiac Doppler and Color Doppler (Both Spectral and Color            Flow Doppler were utilized during procedure). Indications:    Bacteremia R78.81  History:        Patient has no prior history of Echocardiogram examinations.                 Risk Factors:Hypertension and Diabetes.  Sonographer:    Webb Laws Referring Phys: [id number] SABINA MANANDHAR IMPRESSIONS  1. No vegetations noted Cannot, given acoustic windows, rule out Would recommend TEE if clinically indicated.  2. Left ventricular ejection fraction, by estimation, is 55 to 60%. The left ventricle has normal function. The left ventricle has no regional wall motion abnormalities. There is moderate left ventricular hypertrophy. Left ventricular diastolic parameters are indeterminate.  3. Right ventricular systolic function is normal. The right ventricular size is normal.  4. The mitral valve is normal in structure. No evidence of mitral valve regurgitation.  5. The aortic valve is tricuspid. Aortic valve regurgitation is not visualized. Aortic valve sclerosis is present, with no evidence of aortic valve stenosis.  6. The inferior vena cava is dilated in size with <50% respiratory variability, suggesting right atrial pressure of 15 mmHg. FINDINGS  Left Ventricle: Left ventricular ejection fraction, by estimation, is 55 to 60%. The left ventricle has normal function. The left ventricle has no regional wall motion abnormalities. The left ventricular internal cavity size was normal in size. There is  moderate left ventricular hypertrophy. Left ventricular diastolic parameters are  indeterminate. Right Ventricle: The right ventricular size is normal. Right vetricular wall thickness was not assessed. Right ventricular systolic function is normal. Left Atrium: Left atrial size was normal in size. Right Atrium: Right atrial size was normal in size. Pericardium: There is no evidence of pericardial effusion.  Mitral Valve: The mitral valve is normal in structure. No evidence of mitral valve regurgitation. Tricuspid Valve: The tricuspid valve is normal in structure. Tricuspid valve regurgitation is mild. Aortic Valve: The aortic valve is tricuspid. Aortic valve regurgitation is not visualized. Aortic valve sclerosis is present, with no evidence of aortic valve stenosis. Pulmonic Valve: The pulmonic valve was not well visualized. Pulmonic valve regurgitation is not visualized. No evidence of pulmonic stenosis. Aorta: The aortic root and ascending aorta are structurally normal, with no evidence of dilitation. Venous: The inferior vena cava is dilated in size with less than 50% respiratory variability, suggesting right atrial pressure of 15 mmHg. IAS/Shunts: No atrial level shunt detected by color flow Doppler.  LEFT VENTRICLE PLAX 2D LVIDd:         4.10 cm   Diastology LVIDs:         2.80 cm   LV e' medial:    5.87 cm/s LV PW:         0.90 cm   LV E/e' medial:  11.6 LV IVS:        1.50 cm   LV e' lateral:   8.81 cm/s LVOT diam:     1.70 cm   LV E/e' lateral: 7.7 LV SV:         60 LV SV Index:   29 LVOT Area:     2.27 cm  RIGHT VENTRICLE             IVC RV Basal diam:  3.40 cm     IVC diam: 2.50 cm RV S prime:     15.70 cm/s TAPSE (M-mode): 2.1 cm LEFT ATRIUM             Index        RIGHT ATRIUM           Index LA diam:        4.10 cm 2.01 cm/m   RA Area:     15.30 cm LA Vol (A2C):   48.6 ml 23.77 ml/m  RA Volume:   39.90 ml  19.51 ml/m LA Vol (A4C):   37.7 ml 18.44 ml/m LA Biplane Vol: 43.7 ml 21.37 ml/m  AORTIC VALVE LVOT Vmax:   115.00 cm/s LVOT Vmean:  81.700 cm/s LVOT VTI:    0.263 m  AORTA  Ao Root diam: 3.00 cm Ao Asc diam:  3.50 cm MITRAL VALVE               TRICUSPID VALVE MV Area (PHT): 5.13 cm    TR Peak grad:   18.7 mmHg MV Decel Time: 148 msec    TR Vmax:        216.00 cm/s MV E velocity: 67.90 cm/s MV A velocity: 99.00 cm/s  SHUNTS MV E/A ratio:  0.69        Systemic VTI:  0.26 m                            Systemic Diam: 1.70 cm Dietrich Pates MD Electronically signed by Dietrich Pates MD Signature Date/Time: 10/12/2023/5:27:16 PM    Final    DG Knee 1-2 Views Right Result Date: 10/12/2023 CLINICAL DATA:  Right knee pain.  Fall 2 days ago. EXAM: RIGHT KNEE - 1-2 VIEW COMPARISON:  None Available. FINDINGS: There is diffuse decreased bone mineralization. Moderate to severe patellofemoral joint space narrowing and peripheral osteophytosis. No joint effusion. The medial and lateral knee compartment joint spaces are  maintained. Mild peripheral weight-bearing medial femoral condyle degenerative osteophytosis. No acute fracture or dislocation. 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 Electronically Signed   By: Neita Garnet M.D.   On: 10/12/2023 16:32   DG Chest Port 1 View Result Date: 10/08/2023 CLINICAL DATA:  Questionable sepsis - evaluate for abnormality EXAM: PORTABLE CHEST 1 VIEW COMPARISON:  CT chest 10/08/2023 FINDINGS: The heart and mediastinal contours are within normal limits. Other sclerotic plaque. No focal consolidation. No pulmonary edema. No pleural effusion. No pneumothorax. No acute osseous abnormality. IMPRESSION: 1. No active disease. 2.  Aortic Atherosclerosis (ICD10-I70.0). Electronically Signed   By: Tish Frederickson M.D.   On: 10/08/2023 21:26   CT Head Wo Contrast Result Date: 10/08/2023 CLINICAL DATA:  Mental status change, unknown cause EXAM: CT HEAD WITHOUT CONTRAST TECHNIQUE: Contiguous axial images were obtained from the base of the skull through the vertex without intravenous contrast. RADIATION DOSE REDUCTION: This exam was performed according to the  departmental dose-optimization program which includes automated exposure control, adjustment of the mA and/or kV according to patient size and/or use of iterative reconstruction technique. COMPARISON:  CT head 06/11/2013 report without imaging. FINDINGS: Brain: No evidence of large-territorial acute infarction. No parenchymal hemorrhage. No mass lesion. No extra-axial collection. No mass effect or midline shift. No hydrocephalus. Basilar cisterns are patent. Vascular: No hyperdense vessel. Atherosclerotic calcifications are present within the cavernous internal carotid arteries. Skull: No acute fracture or focal lesion. Sinuses/Orbits: Paranasal sinuses and mastoid air cells are clear. The orbits are unremarkable. Other: None. IMPRESSION: No acute intracranial abnormality. Electronically Signed   By: Tish Frederickson M.D.   On: 10/08/2023 21:26   CT CHEST ABDOMEN PELVIS WO CONTRAST Result Date: 10/08/2023 CLINICAL DATA:  Sepsis.  Prior UTIs EXAM: CT CHEST, ABDOMEN AND PELVIS WITHOUT CONTRAST TECHNIQUE: Multidetector CT imaging of the chest, abdomen and pelvis was performed following the standard protocol without IV contrast. RADIATION DOSE REDUCTION: This exam was performed according to the departmental dose-optimization program which includes automated exposure control, adjustment of the mA and/or kV according to patient size and/or use of iterative reconstruction technique. COMPARISON:  None Available. FINDINGS: CT CHEST FINDINGS Cardiovascular: Normal heart size. No significant pericardial effusion. The thoracic aorta is normal in caliber. Mild atherosclerotic plaque of the thoracic aorta. At least 3 vessel coronary artery calcifications. Mediastinum/Nodes: No enlarged mediastinal, hilar, or axillary lymph nodes. Thyroid gland, trachea, and esophagus demonstrate no significant findings. Tiny hiatal hernia for in Lungs/Pleura: Diffuse mild bronchial wall thickening. No focal consolidation. No pulmonary nodule. No  pulmonary mass. No pleural effusion. No pneumothorax. Musculoskeletal: No chest wall abnormality. No suspicious lytic or blastic osseous lesions. No acute displaced fracture. Cervical spine surgical hardware. CT ABDOMEN PELVIS FINDINGS Hepatobiliary: No focal liver abnormality. Status post cholecystectomy. No biliary dilatation. Pancreas: No focal lesion. Slight haziness of the proximal pancreatic contour with trace fat stranding. No surrounding inflammatory changes. No main pancreatic ductal dilatation. Spleen: Normal in size without focal abnormality. Adrenals/Urinary Tract: No adrenal nodule bilaterally. Mild left hydroureteronephrosis. No right hydroureteronephrosis. No nephroureterolithiasis bilaterally. Urinary bladder wall circumferential urinary bladder wall thickening and mild perivesicular fat stranding. Stomach/Bowel: Stomach is within normal limits. No evidence of bowel wall thickening or dilatation. Colonic diverticulosis. Appendix appears normal. Vascular/Lymphatic: No abdominal aorta or iliac aneurysm. Severe atherosclerotic plaque of the aorta and its branches. No abdominal, pelvic, or inguinal lymphadenopathy. Reproductive: Status post hysterectomy. No adnexal masses. Other: No intraperitoneal free fluid. No intraperitoneal free gas. No organized fluid collection. Musculoskeletal: No abdominal  wall hernia or abnormality. No suspicious lytic or blastic osseous lesions. No acute displaced fracture. Diffusely decreased bone density. L3-L4 posterolateral surgical hardware fusion. Multilevel degenerative changes spine. IMPRESSION: 1. No acute intrathoracic abnormality. 2. Cystitis. 3. Mild left hydroureteronephrosis. This may reflect the residual of a recently passed calculus or reflect chronic changes of obstructive uropathy or reflux. 4. Question mild acute pancreatitis.  Correlate with lipase levels. 5. Colonic diverticulosis with no acute diverticulitis. 6. Limited evaluation on this noncontrast  study. Electronically Signed   By: Tish Frederickson M.D.   On: 10/08/2023 21:24     Subjective: Pt reports that she is feeling much better, she ambulated well with PT and she says that she is no longer dizzy after IV fluids and rest.  She says she has home health PT at home from prior hospitalization and they have already come out to her home.    Discharge Exam: Vitals:   11/06/23 0505 11/06/23 0901  BP: 96/62 (!) 90/46  Pulse: 79 88  Resp: 16   Temp: 97.6 F (36.4 C)   SpO2: 98%    Vitals:   11/05/23 2123 11/06/23 0429 11/06/23 0505 11/06/23 0901  BP: 124/61  96/62 (!) 90/46  Pulse: 92  79 88  Resp:  18 16   Temp:  98.2 F (36.8 C) 97.6 F (36.4 C)   TempSrc:  Oral Oral   SpO2:  97% 98%   Weight:      Height:       General: Pt is alert, awake, not in acute distress Cardiovascular: normal S1/S2 +, no rubs, no gallops Respiratory: CTA bilaterally, no wheezing, no rhonchi Abdominal: Soft, NT, ND, bowel sounds + Extremities: no edema, no cyanosis   The results of significant diagnostics from this hospitalization (including imaging, microbiology, ancillary and laboratory) are listed below for reference.     Microbiology: Recent Results (from the past 240 hours)  Blood Culture (routine x 2)     Status: None (Preliminary result)   Collection Time: 11/05/23  1:11 PM   Specimen: BLOOD LEFT WRIST  Result Value Ref Range Status   Specimen Description   Final    BLOOD LEFT WRIST BOTTLES DRAWN AEROBIC AND ANAEROBIC   Special Requests   Final    Blood Culture results may not be optimal due to an inadequate volume of blood received in culture bottles Performed at [facility], [address], [place], [place] [address]    Culture PENDING  Incomplete   Report Status PENDING  Incomplete  Blood Culture (routine x 2)     Status: None (Preliminary result)   Collection Time: 11/05/23  1:57 PM   Specimen: Right Antecubital; Blood  Result Value Ref Range Status   Specimen Description    Final    RIGHT ANTECUBITAL BOTTLES DRAWN AEROBIC AND ANAEROBIC   Special Requests   Final    Blood Culture adequate volume Performed at [facility], [address], [place], [place] [address]    Culture PENDING  Incomplete   Report Status PENDING  Incomplete     Labs: BNP (last 3 results) Recent Labs    10/08/23 1929  BNP 39.0   Basic Metabolic Panel: Recent Labs  Lab 11/05/23 1311 11/06/23 0457  NA 137 138  K 3.7 3.4*  CL 99 104  CO2 27 26  GLUCOSE 235* 213*  BUN 18 15  CREATININE 0.92 0.67  CALCIUM 9.6 8.7*   Liver Function Tests: Recent Labs  Lab 11/05/23 1311  AST  22  ALT 15  ALKPHOS 106  BILITOT 0.9  PROT 6.7  ALBUMIN 3.2*   No results for input(s): "LIPASE", "AMYLASE" in the last 168 hours. No results for input(s): "AMMONIA" in the last 168 hours. CBC: Recent Labs  Lab 11/05/23 1311 11/06/23 0457  WBC 8.0 6.9  NEUTROABS 4.1  --   HGB 12.3 10.1*  HCT 38.5 32.0*  MCV 93.2 94.1  PLT 235 187   Cardiac Enzymes: No results for input(s): "CKTOTAL", "CKMB", "CKMBINDEX", "TROPONINI" in the last 168 hours. BNP: Invalid input(s): "POCBNP" CBG: Recent Labs  Lab 11/05/23 1704 11/05/23 2015 11/06/23 0715  GLUCAP 117* 176* 211*   D-Dimer No results for input(s): "DDIMER" in the last 72 hours. Hgb A1c No results for input(s): "HGBA1C" in the last 72 hours. Lipid Profile No results for input(s): "CHOL", "HDL", "LDLCALC", "TRIG", "CHOLHDL", "LDLDIRECT" in the last 72 hours. Thyroid function studies No results for input(s): "TSH", "T4TOTAL", "T3FREE", "THYROIDAB" in the last 72 hours.  Invalid input(s): "FREET3" Anemia work up No results for input(s): "VITAMINB12", "FOLATE", "FERRITIN", "TIBC", "IRON", "RETICCTPCT" in the last 72 hours. Urinalysis    Component Value Date/Time   COLORURINE AMBER (A) 11/05/2023 1238   APPEARANCEUR TURBID (A) 11/05/2023 1238   APPEARANCEUR Cloudy (A) 06/01/2023 1607   LABSPEC 1.010 11/05/2023 1238   PHURINE 5.0  11/05/2023 1238   GLUCOSEU NEGATIVE 11/05/2023 1238   HGBUR SMALL (A) 11/05/2023 1238   BILIRUBINUR NEGATIVE 11/05/2023 1238   BILIRUBINUR negative 08/17/2023 1933   BILIRUBINUR Negative 06/01/2023 1607   KETONESUR NEGATIVE 11/05/2023 1238   PROTEINUR 100 (A) 11/05/2023 1238   UROBILINOGEN 0.2 08/17/2023 1933   NITRITE NEGATIVE 11/05/2023 1238   LEUKOCYTESUR LARGE (A) 11/05/2023 1238   Sepsis Labs Recent Labs  Lab 11/05/23 1311 11/06/23 0457  WBC 8.0 6.9   Microbiology Recent Results (from the past 240 hours)  Blood Culture (routine x 2)     Status: None (Preliminary result)   Collection Time: 11/05/23  1:11 PM   Specimen: BLOOD LEFT WRIST  Result Value Ref Range Status   Specimen Description   Final    BLOOD LEFT WRIST BOTTLES DRAWN AEROBIC AND ANAEROBIC   Special Requests   Final    Blood Culture results may not be optimal due to an inadequate volume of blood received in culture bottles Performed at [facility], [address], [place], [place] [address]    Culture PENDING  Incomplete   Report Status PENDING  Incomplete  Blood Culture (routine x 2)     Status: None (Preliminary result)   Collection Time: 11/05/23  1:57 PM   Specimen: Right Antecubital; Blood  Result Value Ref Range Status   Specimen Description   Final    RIGHT ANTECUBITAL BOTTLES DRAWN AEROBIC AND ANAEROBIC   Special Requests   Final    Blood Culture adequate volume Performed at [facility], [address] [place], [place] [address]    Culture PENDING  Incomplete   Report Status PENDING  Incomplete   Time coordinating discharge:  35 mins   SIGNED:  Standley Dakins, MD  [facility] 11/06/2023, 9:51 AM How to contact the [facility], [facility], [name] Attending or Consulting provider 7A - 7P or covering provider during after hours 7P -7A, for this patient?  Check the care team in [facility] and look for a) attending/consulting [facility] provider listed and b) the [facility] team listed Log into [email or web address] and use [place]'s  universal password to access. If you do not have the password,  please contact the hospital operator. Locate the [facility] provider you are looking for under [facility] and page to a number that you can be directly reached. If you still have difficulty reaching the provider, please page the [facility] (Director on Call) for the Hospitalists listed on amion for assistance.

## 2023-11-06 NOTE — Progress Notes (Deleted)
   11/06/23 0800  TOC Brief Assessment  Home environment has been reviewed Single Family Home  Prior level of function: Indpenedent  Prior/Current Home Services No current home services  Readmission risk has been reviewed Yes  Transition of care needs no transition of care needs at this time

## 2023-11-06 NOTE — Care Management Obs Status (Signed)
 MEDICARE OBSERVATION STATUS NOTIFICATION   Patient Details  Name: Amy Livingston MRN: [id number] Date of Birth: 1956-01-08   Medicare Observation Status Notification Given:   yes    Anabel Halon, RN 11/06/2023, 7:46 AM

## 2023-11-06 NOTE — Progress Notes (Signed)
   11/06/23 0901  Vitals  BP (!) 90/46  Pulse Rate 88  MEWS COLOR  MEWS Score Color Green  MEWS Score  MEWS Temp 0  MEWS Systolic 1  MEWS Pulse 0  MEWS RR 0  MEWS LOC 0  MEWS Score 1   MD Johnson aware. Held scheduled metoprolol and lisinopril.

## 2023-11-06 NOTE — Plan of Care (Signed)
  Problem: Acute Rehab PT Goals(only PT should resolve) Goal: Pt Will Go Supine/Side To Sit Outcome: Progressing Flowsheets (Taken 11/06/2023 0900) Pt will go Supine/Side to Sit: Independently Goal: Patient Will Transfer Sit To/From Stand Outcome: Progressing Flowsheets (Taken 11/06/2023 0900) Patient will transfer sit to/from stand: Independently Goal: Pt Will Transfer Bed To Chair/Chair To Bed Outcome: Progressing Flowsheets (Taken 11/06/2023 0900) Pt will Transfer Bed to Chair/Chair to Bed: Independently Goal: Pt Will Ambulate Outcome: Progressing Flowsheets (Taken 11/06/2023 0900) Pt will Ambulate:  100 feet  with modified independence   Luz Lex, PT, DPT [facility] Office: [phone] 9:00 AM, 11/06/23

## 2023-11-06 NOTE — Discharge Instructions (Addendum)
 We are asking you to cut your blood pressure tablets in half and only take half tablet because we see you are having soft low blood pressures while in the hospital.  Follow up with your primary care provider to have your blood pressure checked again in 1-2 weeks.     IMPORTANT INFORMATION: PAY CLOSE ATTENTION   PHYSICIAN DISCHARGE INSTRUCTIONS  Follow with Primary care provider  Gilmore Laroche, FNP  and other consultants as instructed by your Hospitalist Physician  SEEK MEDICAL CARE OR RETURN TO EMERGENCY ROOM IF SYMPTOMS COME BACK, WORSEN OR NEW PROBLEM DEVELOPS   Please note: You were cared for by a hospitalist during your hospital stay. Every effort will be made to forward records to your primary care provider.  You can request that your primary care provider send for your hospital records if they have not received them.  Once you are discharged, your primary care physician will handle any further medical issues. Please note that NO REFILLS for any discharge medications will be authorized once you are discharged, as it is imperative that you return to your primary care physician (or establish a relationship with a primary care physician if you do not have one) for your post hospital discharge needs so that they can reassess your need for medications and monitor your lab values.  Please get a complete blood count and chemistry panel checked by your Primary MD at your next visit, and again as instructed by your Primary MD.  Get Medicines reviewed and adjusted: Please take all your medications with you for your next visit with your Primary MD  Laboratory/radiological data: Please request your Primary MD to go over all hospital tests and procedure/radiological results at the follow up, please ask your primary care provider to get all Hospital records sent to his/her office.  In some cases, they will be blood work, cultures and biopsy results pending at the time of your discharge. Please request  that your primary care provider follow up on these results.  If you are diabetic, please bring your blood sugar readings with you to your follow up appointment with primary care.    Please call and make your follow up appointments as soon as possible.    Also Note the following: If you experience worsening of your admission symptoms, develop shortness of breath, life threatening emergency, suicidal or homicidal thoughts you must seek medical attention immediately by calling 911 or calling your MD immediately  if symptoms less severe.  You must read complete instructions/literature along with all the possible adverse reactions/side effects for all the Medicines you take and that have been prescribed to you. Take any new Medicines after you have completely understood and accpet all the possible adverse reactions/side effects.   Do not drive when taking Pain medications or sleeping medications (Benzodiazepines)  Do not take more than prescribed Pain, Sleep and Anxiety Medications. It is not advisable to combine anxiety,sleep and pain medications without talking with your primary care practitioner  Special Instructions: If you have smoked or chewed Tobacco  in the last 2 yrs please stop smoking, stop any regular Alcohol  and or any Recreational drug use.  Wear Seat belts while driving.  Do not drive if taking any narcotic, mind altering or controlled substances or recreational drugs or alcohol.

## 2023-11-07 ENCOUNTER — Ambulatory Visit: Payer: Self-pay | Admitting: *Deleted

## 2023-11-07 ENCOUNTER — Encounter: Payer: Self-pay | Admitting: *Deleted

## 2023-11-07 LAB — URINE CULTURE: Culture: >=100000 — AB

## 2023-11-07 NOTE — Patient Outreach (Signed)
 Care Coordination   Health Equity Plan Follow Up Visit Note   11/07/2023 Name: Amy Livingston MRN: [id number] DOB: 05-May-1956  Tineka Uriegas is a 68 y.o. year old female who sees Gilmore Laroche, FNP for primary care. I spoke with  Gordy Clement by phone today.  What matters to the patients health and wellness today?  Improving balance and mobility and resolution of dizziness    Goals Addressed             This Visit's Progress    Manage Dizziness and Prevent Falls   On track    Care Coordination Goals: Patient will use walker for balance Patient will move carefully and change positions slowly to minimize risk for falling Patient will work with HHPT Patient will take bedside commode order to [facility] to be filled Patient will keep appointment with PCP on 11/18/23 Patient will talk obtain an order from PCP for walker Patient will talk with PCP about a cardiology referral to establish care with a new cardiologist in [place] Patient will monitor and record blood pressure daily and as needed and take log to office visit for review Patient will monitor blood sugar at least twice a day and as needed if symptomatic  Patient will discuss episodes of dizziness and presyncopal symptoms with provider. Further testing may be needed. Patient will reach out to RN Care Manager as needed [phone]           SDOH assessments and interventions completed:  Yes SDOH Interventions Today    Flowsheet Row Most Recent Value  SDOH Interventions   Transportation Interventions Intervention Not Indicated, Patient Resources (Friends/Family)        Care Coordination Interventions:  Yes, provided  Interventions Today    Flowsheet Row Most Recent Value  Chronic Disease   Chronic disease during today's visit Diabetes, Hypertension (HTN), Atrial Fibrillation (AFib)  [UTI]  General Interventions   General Interventions Discussed/Reviewed General Interventions Discussed, General  Interventions Reviewed, Durable Medical Equipment (DME), Labs, Doctor Visits, Communication with  Annabell Sabal and discussed hospitalization notes and lab results.  UTI due to E. coli. Continues to have dizziness and weakness]  Doctor Visits Discussed/Reviewed Doctor Visits Discussed, Doctor Visits Reviewed, PCP, Specialist  Durable Medical Equipment (DME) Bed side commode, Walker, BP Cuff, Glucomoter  [Has order for [facility]. Will take to [facility]. Sharing a walker with someone else. Will request order from PCP at visit.]  PCP/Specialist Visits Compliance with follow-up visit  [arranged Appt. with PCP through care guides. Scheduled for 11/18/23.Notes added: requesting order for walker and referral to cardiologist in [place] for Afib]  Communication with PCP/Specialists, Pharmacists  [Staff message to PCP advising that patient needs an order for a walker of her own during face-to-face visit on the 11th. Conference call to [place] to verify process for obtaining DME with a printed order.]  Exercise Interventions   Exercise Discussed/Reviewed Physical Activity  [using rolling walker (2 wheels). Unable to stand or ambulate for long due to dizziness. Staying in the bed mostly. will start HHPT within next week. Had evaluation on 11/03/23.]  Physical Activity Discussed/Reviewed Physical Activity Discussed, Physical Activity Reviewed  Education Interventions   Education Provided Provided Education  Provided Verbal Education On Labs, Blood Sugar Monitoring, Other, Exercise, Medication, When to see the doctor  [Importance of blood pressure monitoring and recording]  Labs Reviewed --  [discussed urine culture results from hospitalization. Different bacteria than she had during previous admission.]  Nutrition Interventions   Nutrition Discussed/Reviewed Nutrition Discussed,  Nutrition Reviewed, Fluid intake  Pharmacy Interventions   Pharmacy Dicussed/Reviewed Pharmacy Topics Discussed, Pharmacy  Topics Reviewed, Medications and their functions  [metoprolol and lisinporil were cut in half during hospitalization due to soft BPs and patient was advised to follow-up with PCP regarding BP management and dosage.]  Safety Interventions   Safety Discussed/Reviewed Safety Discussed, Safety Reviewed, Fall Risk, Home Safety  Home Safety Assistive Devices       Follow up plan: Follow up call scheduled for 11/16/23    Encounter Outcome:  Patient Visit Completed   Demetrios Loll, RN, BSN [place]  [facility] RN Care Manager Direct Dial: [phone]  Fax: [phone]

## 2023-11-08 ENCOUNTER — Ambulatory Visit: Payer: Self-pay

## 2023-11-08 NOTE — Telephone Encounter (Signed)
 Chief Complaint: dizziness Symptoms: dizziness, hypotension, dry mouth, loss of appetite Frequency: today Pertinent Negatives: Patient denies CP, SOB, palpitations, N/V/D, diaphoresis Disposition: [x] ED /[] Urgent Care (no appt availability in office) / [] Appointment(In office/virtual)/ []  [facility]/ [] Home Care/ [x] Refused Recommended Disposition /[] [facility]/ []  Follow-up with PCP  Additional Notes: Ladona Ridgel, a physical therapist with [facility], calls on behalf of the pt. Ladona Ridgel states pt became dizzy when she stood today. Ladona Ridgel checked her BP when sitting and it was 150/80 in her R arm. Pt stood up and Ladona Ridgel tried to check her BP again in that R arm and could not hear anything (manual cuff). Ladona Ridgel found her BP was 80/60 standing in her L arm. Pt endorses dizziness every time she stands and has to sit back down. Also endorses black spots, even without standing.  Pt states she has not eaten today. States she does not have an appetite. Pt is also not drinking very many fluids and states her mouth is dry. Denies CP, SOB, palpitations, diaphoresis, N/V/D, bloody stools. Pt did endorse diarrhea recently in the hospital.  RN advised pt she needs to go back to the ED. Pt declined at this time but stated she would "think about it." RN advised pt why the ED is the safest place to have her symptoms addressed. She said she will think about it. Pt is with other family members at the house. RN advised pt if she gets worse or has CP or SOB she needs to call 911. Pt verbalized understanding. RN called [facility].    Copied from CRM [id number]. Topic: Clinical - Red Word Triage >> Nov 08, 2023  2:19 PM Nyra Capes wrote: Red Word that prompted transfer to Nurse Triage: Ladona Ridgel a Physical Therapist with [facility] phone # [phone] patient is have BP problems.  80/60 left arm when patient was standing  and sitting right 150/80 sitting  when standing getts really dizzy seeing  spots. These were done with manual cuff Reason for Disposition  [1] Fall in systolic BP > 20 mm Hg from normal AND [2] dizzy, lightheaded, or weak  Answer Assessment - Initial Assessment Questions 1. BLOOD PRESSURE: "What is the blood pressure?" "Did you take at least two measurements 5 minutes apart?"     150/80 sitting in the R arm. Could not hear in her R arm standing. L arm standing was 80/60. 2. ONSET: "When did you take your blood pressure?"     Manual cuff - just right now 3. HOW: "How did you obtain the blood pressure?" (e.g., visiting nurse, automatic home BP monitor)     Manual cuff 4. HISTORY: "Do you have a history of low blood pressure?" "What is your blood pressure normally?"     Hx of dizziness and low BP with standing 5. MEDICINES: "Are you taking any medications for blood pressure?" If Yes, ask: "Have they been changed recently?"     Lisinopril, metop 6. PULSE RATE: "Do you know what your pulse rate is?"      84 apical pulse with [place] 7. OTHER SYMPTOMS: "Have you been sick recently?" "Have you had a recent injury?"     BG 200. Pt states she has not eaten today. Has had low appetite. Did not take insulin today. Hx of Afib. Took med for incontinence and gabapentin. Ladona Ridgel states her pulse was "rapid" - 84 apical pulse. Denies taking her BP meds. No diarrhea or vomiting since leaving the hospital. Denies bloody stools. Had  Cdiff and a UTI and staph in her blood in the hospital. Denies CP. Denies SOB. Denies N/V, denies diaphoresis. Denies heart palpitations.   Pt states she has had dizziness with getting up for 6 months. Per Ladona Ridgel with [facility], pt states she can stand and then within seconds she gets dizzy and has to sit down. "I see black dots everywhere", states she sees black dots when she looks at something white even with sitting.  "Hydration issue" - working on a 16oz bottle of water but not drank much. Denies dark urine. Endorses dry mouth. Ladona Ridgel with PT checked  skin turgor and endorsed tenting. Denies one-sided weakness. Denies blurry vision and denies headache.  Protocols used: Blood Pressure - Low-A-AH

## 2023-11-10 ENCOUNTER — Encounter: Payer: Self-pay | Admitting: Internal Medicine

## 2023-11-10 ENCOUNTER — Ambulatory Visit (INDEPENDENT_AMBULATORY_CARE_PROVIDER_SITE_OTHER): Admitting: Internal Medicine

## 2023-11-10 ENCOUNTER — Other Ambulatory Visit: Payer: Self-pay

## 2023-11-10 VITALS — BP 141/84 | HR 90 | Temp 97.9°F | Ht 66.0 in | Wt 204.0 lb

## 2023-11-10 DIAGNOSIS — B9689 Other specified bacterial agents as the cause of diseases classified elsewhere: Secondary | ICD-10-CM

## 2023-11-10 DIAGNOSIS — R7881 Bacteremia: Secondary | ICD-10-CM | POA: Diagnosis not present

## 2023-11-10 LAB — CULTURE, BLOOD (ROUTINE X 2): Special Requests: ADEQUATE

## 2023-11-10 MED ORDER — FLUCONAZOLE 150 MG PO TABS: 150.0000 mg | ORAL_TABLET | Freq: Every day | ORAL | 0 refills | Status: DC

## 2023-11-10 NOTE — Progress Notes (Signed)
 Patient: Amy Livingston  DOB: 05-11-1956 MRN: [id number] PCP: Gilmore Laroche, FNP    Chief Complaint  Patient presents with   Hospitalization Follow-up     Patient Active Problem List   Diagnosis Date Noted   UTI (urinary tract infection) 11/05/2023   Bacteremia due to other bacteria 10/13/2023   Paroxysmal atrial fibrillation (HCC) 10/09/2023   Anxiety and depression 10/09/2023   Syncope and collapse 10/09/2023   BRBPR (bright red blood per rectum) 10/09/2023   Enteritis due to Clostridium difficile 10/09/2023   Diarrhea of infectious origin 10/09/2023   Sepsis due to gram-negative UTI (HCC) 10/08/2023   Hypokalemia 10/08/2023   Hyponatremia 10/08/2023   Uncontrolled type 2 diabetes mellitus with hyperglycemia, with long-term current use of insulin (HCC) 10/08/2023   Essential hypertension 10/08/2023   Peripheral neuropathy 10/08/2023   Overactive bladder 06/17/2023   Generalized anxiety disorder 06/17/2023   Type 2 diabetes mellitus with diabetic neuropathy, unspecified (HCC) 06/17/2023   Hypertension 06/01/2023   Urinary frequency 06/01/2023   Bowel incontinence 06/01/2023   Generalized weakness 06/01/2023   Encounter for immunization 06/01/2023   Diabetes 1.5, 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   PAF (paroxysmal atrial fibrillation) (HCC) 08/14/2020   Dyslipidemia 07/01/2020   Type 2 diabetes mellitus with hyperglycemia (HCC) 07/01/2020   Body mass index (BMI) 40.0-44.9, adult (HCC) 11/07/2019   Elevated blood-pressure reading, without diagnosis of hypertension 11/07/2019   Spondylolisthesis, lumbar region 07/18/2018   Cervical spondylosis with myelopathy 01/13/2016   Cervical disc disorder with myelopathy 12/04/2015   Neck pain 12/04/2015     Subjective:  Mellisa Arshad is a 68 y.o. F with PMHx as below presents for hospital follow-up on uncomplicated staph lugd bacteremia and c diff colitis treated with  cefazolin x 2 weeks from NG blood Cx and vanco x 10  days with 7 day extension for PPX while on abx.PICC out, ept abx 10/24/23 Please see previous HPI for more details.   Review of Systems  All other systems reviewed and are negative.   Past Medical History:  Diagnosis Date   Anxiety    Atrial fibrillation (HCC)    Diabetes mellitus without complication (HCC)    Dysrhythmia    GERD (gastroesophageal reflux disease)    Hypertension    PONV (postoperative nausea and vomiting)    Shortness of breath dyspnea     Outpatient Medications Prior to Visit  Medication Sig Dispense Refill   acetaminophen (TYLENOL) 325 MG tablet Take 2 tablets (650 mg total) by mouth every 6 (six) hours as needed for mild pain (pain score 1-3), headache or fever (or Fever >/= 101).     atorvastatin (LIPITOR) 80 MG tablet Take 1 tablet (80 mg total) by mouth at bedtime.     busPIRone (BUSPAR) 10 MG tablet Take 1 tablet (10 mg total) by mouth 2 (two) times daily. 90 tablet 1   Cholecalciferol (VITAMIN D-3) 25 MCG (1000 UT) CAPS Take 1,000 Units by mouth 2 (two) times daily at 10 am and 4 pm.     cyclobenzaprine (FLEXERIL) 10 MG tablet Take 10 mg by mouth every 12 (twelve) hours.     ELIQUIS 5 MG TABS tablet Take 5 mg by mouth 2 (two) times daily.     gabapentin (NEURONTIN) 300 MG capsule Take 1 capsule (300 mg total) by mouth 3 (three) times daily. 120 capsule 1   insulin isophane & regular human KwikPen (NOVOLIN 70/30 KWIKPEN) (70-30) 100 UNIT/ML KwikPen Inject 40 Units into the  skin 2 (two) times daily with a meal. 3 mL 1   lisinopril (ZESTRIL) 5 MG tablet Take 0.5 tablets (2.5 mg total) by mouth daily.     meclizine (ANTIVERT) 25 MG tablet Take 25 mg by mouth daily as needed for dizziness.     metoprolol tartrate (LOPRESSOR) 50 MG tablet Take 0.5 tablets (25 mg total) by mouth 2 (two) times daily.     mirabegron ER (MYRBETRIQ) 25 MG TB24 tablet Take 1 tablet (25 mg total) by mouth at bedtime.     omeprazole (PRILOSEC) 40 MG capsule Take 40 mg by mouth daily.      ondansetron (ZOFRAN) 4 MG tablet Take 1 tablet (4 mg total) by mouth every 6 (six) hours as needed for nausea. 20 tablet 0   oxyCODONE-acetaminophen (PERCOCET) 10-325 MG tablet Take 1 tablet by mouth every 8 (eight) hours as needed for pain.     sertraline (ZOLOFT) 50 MG tablet Take 1 tablet (50 mg total) by mouth at bedtime.     tirzepatide [facility]) 7.5 MG/0.5ML Pen Inject 7.5 mg into the skin every Tuesday.     No facility-administered medications prior to visit.     Allergies  Allergen Reactions   Elemental Sulfur Itching and Rash    Yeast infections   Metformin And Related Diarrhea    GI distress    Social History   Tobacco Use   Smoking status: Never   Smokeless tobacco: Never  Substance Use Topics   Alcohol use: No   Drug use: No    No family history on file.  Objective:   Vitals:   11/10/23 1421  BP: (!) 141/84  Pulse: 90  Temp: 97.9 F (36.6 C)  TempSrc: Oral  SpO2: 99%  Weight: 204 lb (92.5 kg)  Height: 5\' 6"  (1.676 m)   Body mass index is 32.93 kg/m.  Physical Exam Constitutional:      Appearance: Normal appearance.  HENT:     Head: Normocephalic and atraumatic.     Right Ear: Tympanic membrane normal.     Left Ear: Tympanic membrane normal.     Nose: Nose normal.     Mouth/Throat:     Mouth: Mucous membranes are moist.  Eyes:     Extraocular Movements: Extraocular movements intact.     Conjunctiva/sclera: Conjunctivae normal.     Pupils: Pupils are equal, round, and reactive to light.  Cardiovascular:     Rate and Rhythm: Normal rate and regular rhythm.     Heart sounds: No murmur heard.    No friction rub. No gallop.  Pulmonary:     Effort: Pulmonary effort is normal.     Breath sounds: Normal breath sounds.  Abdominal:     General: Abdomen is flat.     Palpations: Abdomen is soft.  Musculoskeletal:        General: Normal range of motion.  Skin:    General: Skin is warm and dry.  Neurological:     General: No focal deficit  present.     Mental Status: She is alert and oriented to person, place, and time.  Psychiatric:        Mood and Affect: Mood normal.     Lab Results: Lab Results  Component Value Date   WBC 6.9 11/06/2023   HGB 10.1 (L) 11/06/2023   HCT 32.0 (L) 11/06/2023   MCV 94.1 11/06/2023   PLT 187 11/06/2023    Lab Results  Component Value Date   CREATININE 0.67 11/06/2023  BUN 15 11/06/2023   NA 138 11/06/2023   K 3.4 (L) 11/06/2023   CL 104 11/06/2023   CO2 26 11/06/2023    Lab Results  Component Value Date   ALT 15 11/05/2023   AST 22 11/05/2023   ALKPHOS 106 11/05/2023   BILITOT 0.9 11/05/2023     Assessment & Plan:  # Uncomplicated staph lugdunensis bacteremia 2/2 unclear source # C. difficile diarrhea -tte and tee negative for vegetation -picc out completed 2 weeks of abx Complete 10 days of p.o. vancomycin 125 mg p.o. 4 times daily ( EOT 3/11) followed by prophylactic dosing 125 mg po daily for 7 days after completion of antibiotics ( EOT 3/17)  -labs 3/18: wbc 7.5, scr 0.58, esr 47, srp 21. 3/29 blood Cx ng when interim admission for UTI treated with fosomycin( pt had abodomianl, pressure/pain and increased ruinary urgency which has now resolved._.  -diarrhea resoved, no fevers or chills.  -fu prn  #reported yeast infection -fluconazole x `1  Danelle Earthly, MD [facility] I have personally spent 45  minutes involved in face-to-face and non-face-to-face activities for this patient on the day of the visit. Professional time spent includes the following activities: Preparing to see the patient (review of tests), Obtaining and/or reviewing separately obtained history (admission/discharge record), Performing a medically appropriate examination and/or evaluation , Ordering medications/tests/procedures, referring and communicating with other health care professionals, Documenting clinical information in the EMR,  Independently interpreting results (not separately reported), Communicating results to the patient/family/caregiver, Counseling and educating the patient/family/caregiver and Care coordination (not separately reported).    11/10/23  2:44 PM

## 2023-11-11 ENCOUNTER — Other Ambulatory Visit: Payer: Self-pay | Admitting: Family Medicine

## 2023-11-11 ENCOUNTER — Telehealth: Payer: Self-pay | Admitting: Family Medicine

## 2023-11-11 NOTE — Telephone Encounter (Signed)
 Copied from CRM [id number]. Topic: General - Other >> Nov 11, 2023 10:13 AM Georgeanna Harrison H wrote: Reason for CRM: Leeanne Deed with [facility] called in to report a high blood sugar for Amy Livingston, being at 351 and she has not took her insulin this morning. Home health nurse is providing her with education on this matter, he states she is not experiencing any issues related besides a little dizziness when she stands, but its not frequent

## 2023-11-16 ENCOUNTER — Ambulatory Visit: Payer: Self-pay | Admitting: *Deleted

## 2023-11-18 ENCOUNTER — Ambulatory Visit (INDEPENDENT_AMBULATORY_CARE_PROVIDER_SITE_OTHER): Admitting: Family Medicine

## 2023-11-18 VITALS — BP 113/73 | HR 96 | Resp 16 | Ht 66.0 in | Wt 202.1 lb

## 2023-11-18 DIAGNOSIS — E785 Hyperlipidemia, unspecified: Secondary | ICD-10-CM

## 2023-11-18 DIAGNOSIS — E1169 Type 2 diabetes mellitus with other specified complication: Secondary | ICD-10-CM

## 2023-11-18 DIAGNOSIS — I480 Paroxysmal atrial fibrillation: Secondary | ICD-10-CM

## 2023-11-18 DIAGNOSIS — R7301 Impaired fasting glucose: Secondary | ICD-10-CM

## 2023-11-18 DIAGNOSIS — E559 Vitamin D deficiency, unspecified: Secondary | ICD-10-CM

## 2023-11-18 DIAGNOSIS — E139 Other specified diabetes mellitus without complications: Secondary | ICD-10-CM

## 2023-11-18 DIAGNOSIS — N390 Urinary tract infection, site not specified: Secondary | ICD-10-CM | POA: Diagnosis not present

## 2023-11-18 DIAGNOSIS — H6593 Unspecified nonsuppurative otitis media, bilateral: Secondary | ICD-10-CM

## 2023-11-18 DIAGNOSIS — E1165 Type 2 diabetes mellitus with hyperglycemia: Secondary | ICD-10-CM | POA: Diagnosis not present

## 2023-11-18 DIAGNOSIS — I10 Essential (primary) hypertension: Secondary | ICD-10-CM | POA: Diagnosis not present

## 2023-11-18 DIAGNOSIS — E038 Other specified hypothyroidism: Secondary | ICD-10-CM

## 2023-11-18 DIAGNOSIS — Z794 Long term (current) use of insulin: Secondary | ICD-10-CM

## 2023-11-18 DIAGNOSIS — G4709 Other insomnia: Secondary | ICD-10-CM

## 2023-11-18 MED ORDER — TRAZODONE HCL 100 MG PO TABS: 100.0000 mg | ORAL_TABLET | Freq: Every day | ORAL | 1 refills | Status: DC

## 2023-11-18 MED ORDER — NOVOLIN 70/30 FLEXPEN (70-30) 100 UNIT/ML ~~LOC~~ SUPN: PEN_INJECTOR | SUBCUTANEOUS | 1 refills | Status: DC

## 2023-11-18 MED ORDER — NITROFURANTOIN MONOHYD MACRO 100 MG PO CAPS
100.0000 mg | ORAL_CAPSULE | Freq: Two times a day (BID) | ORAL | 0 refills | Status: AC
Start: 2023-11-18 — End: 2023-11-25

## 2023-11-18 MED ORDER — TIRZEPATIDE 10 MG/0.5ML ~~LOC~~ SOAJ: 10.0000 mg | SUBCUTANEOUS | 0 refills | Status: DC

## 2023-11-18 NOTE — Progress Notes (Signed)
 Established Patient Office Visit  Subjective:  Patient ID: Amy Livingston, female    DOB: Sep 24, 1955  Age: 67 y.o. MRN: [id number]  CC:  Chief Complaint  Patient presents with   Hospitalization Follow-up    Was admitted to Hospital after falling from dizzy spell. Also presented with UTI. States she is still experiencing dizzy spells.     HPI Amy Livingston is a 68 y.o. female with past medical history of type 2 diabetes, hypertension and hyperlipidemia presents for f/u of  chronic medical conditions. For the details of today's visit, please refer to the assessment and plan.     Past Medical History:  Diagnosis Date   Anxiety    Atrial fibrillation (HCC)    Diabetes mellitus without complication (HCC)    Dysrhythmia    GERD (gastroesophageal reflux disease)    Hypertension    PONV (postoperative nausea and vomiting)    Shortness of breath dyspnea     Past Surgical History:  Procedure Laterality Date   ABDOMINAL HYSTERECTOMY     ANTERIOR CERVICAL DECOMP/DISCECTOMY FUSION N/A 01/13/2016   Procedure: C5-6 ANTERIOR CERVICAL DECOMPRESSION/DISKECTOMY/FUSION;  Surgeon: Elna Haggis, MD;  Location: [facility];  Service: Neurosurgery;  Laterality: N/A;  C5-6 ANTERIOR CERVICAL DECOMPRESSION/DISKECTOMY/FUSION   BACK SURGERY     x 5   BIOPSY  07/25/2023   Procedure: BIOPSY;  Surgeon: Vinetta Greening, DO;  Location: [facility];  Service: Endoscopy;;   CHOLECYSTECTOMY     COLONOSCOPY WITH PROPOFOL N/A 07/25/2023   Procedure: COLONOSCOPY WITH PROPOFOL;  Surgeon: Vinetta Greening, DO;  Location: [facility];  Service: Endoscopy;  Laterality: N/A;  10:45 am, asa 3   ESOPHAGEAL BRUSHING  07/25/2023   Procedure: ESOPHAGEAL BRUSHING;  Surgeon: Vinetta Greening, DO;  Location: [facility];  Service: Endoscopy;;   ESOPHAGOGASTRODUODENOSCOPY (EGD) WITH PROPOFOL N/A 07/25/2023   Procedure: ESOPHAGOGASTRODUODENOSCOPY (EGD) WITH PROPOFOL;  Surgeon: Vinetta Greening, DO;  Location: [facility]  END[facility];  Service: Endoscopy;  Laterality: N/A;   FOOT SURGERY     KNEE ARTHROSCOPY     left   KNEE SURGERY Right    POLYPECTOMY  07/25/2023   Procedure: POLYPECTOMY INTESTINAL;  Surgeon: Vinetta Greening, DO;  Location: [facility];  Service: Endoscopy;;   TEE WITHOUT CARDIOVERSION N/A 10/13/2023   Procedure: ECHOCARDIOGRAM, TRANSESOPHAGEAL;  Surgeon: Lasalle Pointer, MD;  Location: [facility];  Service: Cardiovascular;  Laterality: N/A;   TUBAL LIGATION      No family history on file.  Social History   Socioeconomic History   Marital status: Divorced    Spouse name: Not on file   Number of children: Not on file   Years of education: Not on file   Highest education level: Some college, no degree  Occupational History   Not on file  Tobacco Use   Smoking status: Never   Smokeless tobacco: Never  Substance and Sexual Activity   Alcohol use: No   Drug use: No   Sexual activity: Not on file  Other Topics Concern   Not on file  Social History Narrative   Not on file   Social Drivers of Health   Financial Resource Strain: Low Risk  (10/12/2023)   Overall Financial Resource Strain (CARDIA)    Difficulty of Paying Living Expenses: Not very hard  Food Insecurity: No Food Insecurity (11/05/2023)   Hunger Vital Sign    Worried About Running Out of Food in the Last Year: Never true  Ran Out of Food in the Last Year: Never true  Recent Concern: Food Insecurity - Food Insecurity Present (10/18/2023)   Hunger Vital Sign    Worried About Running Out of Food in the Last Year: Sometimes true    Ran Out of Food in the Last Year: Never true  Transportation Needs: No Transportation Needs (11/07/2023)   PRAPARE - Administrator, Civil Service (Medical): No    Lack of Transportation (Non-Medical): No  Physical Activity: Inactive (10/12/2023)   Exercise Vital Sign    Days of Exercise per Week: 0 days    Minutes of Exercise per Session: 0 min  Stress: Stress Concern  Present (10/12/2023)   [employer] of Occupational Health - Occupational Stress Questionnaire    Feeling of Stress : Rather much  Social Connections: Socially Isolated (11/05/2023)   Social Connection and Isolation Panel [NHANES]    Frequency of Communication with Friends and Family: More than three times a week    Frequency of Social Gatherings with Friends and Family: Once a week    Attends Religious Services: Never    Database administrator or Organizations: No    Attends Banker Meetings: Never    Marital Status: Divorced  Catering manager Violence: Not At Risk (11/05/2023)   Humiliation, Afraid, Rape, and Kick questionnaire    Fear of Current or Ex-Partner: No    Emotionally Abused: No    Physically Abused: No    Sexually Abused: No    Outpatient Medications Prior to Visit  Medication Sig Dispense Refill   acetaminophen (TYLENOL) 325 MG tablet Take 2 tablets (650 mg total) by mouth every 6 (six) hours as needed for mild pain (pain score 1-3), headache or fever (or Fever >/= 101).     atorvastatin (LIPITOR) 80 MG tablet Take 1 tablet (80 mg total) by mouth at bedtime.     busPIRone (BUSPAR) 10 MG tablet Take 1 tablet (10 mg total) by mouth 2 (two) times daily. 90 tablet 1   Cholecalciferol (VITAMIN D-3) 25 MCG (1000 UT) CAPS Take 1,000 Units by mouth 2 (two) times daily at 10 am and 4 pm.     cyclobenzaprine (FLEXERIL) 10 MG tablet Take 10 mg by mouth every 12 (twelve) hours.     ELIQUIS 5 MG TABS tablet Take 5 mg by mouth 2 (two) times daily.     fluconazole (DIFLUCAN) 150 MG tablet Take 1 tablet (150 mg total) by mouth daily. 1 tablet 0   gabapentin (NEURONTIN) 300 MG capsule Take 1 capsule (300 mg total) by mouth 3 (three) times daily. 120 capsule 1   lisinopril (ZESTRIL) 5 MG tablet Take 0.5 tablets (2.5 mg total) by mouth daily.     meclizine (ANTIVERT) 25 MG tablet Take 25 mg by mouth daily as needed for dizziness.     metoprolol tartrate (LOPRESSOR) 50 MG  tablet Take 0.5 tablets (25 mg total) by mouth 2 (two) times daily.     mirabegron ER (MYRBETRIQ) 25 MG TB24 tablet Take 1 tablet (25 mg total) by mouth at bedtime.     omeprazole (PRILOSEC) 40 MG capsule TAKE 1 CAPSULE(40 MG) BY MOUTH DAILY 90 capsule 0   ondansetron (ZOFRAN) 4 MG tablet Take 1 tablet (4 mg total) by mouth every 6 (six) hours as needed for nausea. 20 tablet 0   oxyCODONE-acetaminophen (PERCOCET) 10-325 MG tablet Take 1 tablet by mouth every 8 (eight) hours as needed for pain.     sertraline (  ZOLOFT) 50 MG tablet Take 1 tablet (50 mg total) by mouth at bedtime.     insulin isophane & regular human KwikPen (NOVOLIN 70/30 KWIKPEN) (70-30) 100 UNIT/ML KwikPen Inject 40 Units into the skin 2 (two) times daily with a meal. 3 mL 1   tirzepatide (MOUNJARO) 7.5 MG/0.5ML Pen Inject 7.5 mg into the skin every Tuesday.     No facility-administered medications prior to visit.    Allergies  Allergen Reactions   Elemental Sulfur Itching and Rash    Yeast infections   Metformin And Related Diarrhea    GI distress    ROS Review of Systems  Constitutional:  Negative for chills and fever.  HENT:  Positive for ear discharge.   Eyes:  Negative for visual disturbance.  Respiratory:  Negative for chest tightness and shortness of breath.   Allergic/Immunologic: Negative for immunocompromised state.  Neurological:  Negative for dizziness and headaches.  Psychiatric/Behavioral:  Positive for sleep disturbance and suicidal ideas.       Objective:    Physical Exam HENT:     Head: Normocephalic.     Right Ear: Tympanic membrane and external ear normal. No decreased hearing noted. No drainage.     Left Ear: Tympanic membrane and external ear normal. No decreased hearing noted. No drainage.     Mouth/Throat:     Mouth: Mucous membranes are moist.  Cardiovascular:     Rate and Rhythm: Normal rate.     Heart sounds: Normal heart sounds.  Pulmonary:     Effort: Pulmonary effort is  normal.     Breath sounds: Normal breath sounds.  Neurological:     Mental Status: She is alert.     BP 113/73   Pulse 96   Resp 16   Ht 5\' 6"  (1.676 m)   Wt 202 lb 1.9 oz (91.7 kg)   SpO2 96%   BMI 32.62 kg/m  Wt Readings from Last 3 Encounters:  11/18/23 202 lb 1.9 oz (91.7 kg)  11/10/23 204 lb (92.5 kg)  11/05/23 202 lb 13.2 oz (92 kg)    Lab Results  Component Value Date   TSH 0.634 06/01/2023   Lab Results  Component Value Date   WBC 6.9 11/06/2023   HGB 10.1 (L) 11/06/2023   HCT 32.0 (L) 11/06/2023   MCV 94.1 11/06/2023   PLT 187 11/06/2023   Lab Results  Component Value Date   NA 138 11/06/2023   K 3.4 (L) 11/06/2023   CO2 26 11/06/2023   GLUCOSE 213 (H) 11/06/2023   BUN 15 11/06/2023   CREATININE 0.67 11/06/2023   BILITOT 0.9 11/05/2023   ALKPHOS 106 11/05/2023   AST 22 11/05/2023   ALT 15 11/05/2023   PROT 6.7 11/05/2023   ALBUMIN 3.2 (L) 11/05/2023   CALCIUM 8.7 (L) 11/06/2023   ANIONGAP 8 11/06/2023   EGFR 98.0 10/18/2023   Lab Results  Component Value Date   CHOL 130 06/01/2023   Lab Results  Component Value Date   HDL 49 06/01/2023   Lab Results  Component Value Date   LDLCALC 57 06/01/2023   Lab Results  Component Value Date   TRIG 138 06/01/2023   Lab Results  Component Value Date   CHOLHDL 2.7 06/01/2023   Lab Results  Component Value Date   HGBA1C 12.4 (H) 10/09/2023      Assessment & Plan:  Essential hypertension Assessment & Plan: The patient complained of low blood pressure. Blood pressure was well controlled in the  clinic today, and the patient is asymptomatic. The patient was encouraged to discontinue Lisinopril 0.5 mg daily and continue taking Metoprolol 25 mg twice daily. She was advised to inform the provider if her blood pressure becomes elevated to 140/90 mmHg or higher.       Diabetes 1.5, managed as type 2 [facility]) Assessment & Plan: The patient complains of feeling dizzy, shaky, and sweaty at times.  The likely cause may be hypoglycemic episodes. Will decrease the dose of  insulin adm The patient was encouraged to start taking Novolin 70/30 insulin, 30 units twice daily with meals. She was advised to monitor her blood sugar before administering insulin and not to administer insulin if blood sugar is below 70 mg/dL. She may also begin Mounjaro (tirzepatide) 10 mg once weekly. Symptoms of hypoglycemia were discussed, including: Shakiness, sweating, dizziness, lightheadedness, hunger Irritability, anxiety, confusion Weakness, fatigue, blurred vision Loss of consciousness (in severe cases) If blood glucose is <70 mg/dL and the patient is conscious, she should follow the 15-15 rule: Consume 15 grams of fast-acting carbohydrates (e.g., 4 oz juice, regular soda, glucose tablets, 1 tbsp honey or sugar) Recheck blood glucose in 15 minutes Repeat if still <70 mg/dL      Bilateral serous otitis media, unspecified chronicity Assessment & Plan: The patient complains of a chronic history of clear, waxy drainage from both ears. She denies any allergies, upper respiratory infections, ear pain, hearing loss, or fever. The patient is requesting a referral to ENT, as the symptoms have been present for over 8 years. Referral to ENT has been placed for further evaluation.  Recommendations: Keep the ears dry and protected from water. Avoid irritants such as alcohol-based ear products.    Orders: -     Ambulatory referral to ENT  Urinary tract infection without hematuria, site unspecified Assessment & Plan: UA in the clinic is indicative of UTI Encouraged to start taking Macrobid 100 mg twice daily for 7 days. Complete the full course of antibiotics. To help prevent future UTIs: Avoid holding urine for prolonged periods. Empty your bladder promptly when you feel the urge. Urinate after intercourse to help flush out bacteria. Shower instead of taking baths to reduce exposure to bacteria. Wipe  front to back after urination and bowel movements. Stay well hydrated by drinking a full glass of water regularly.  Orders: -     Urinalysis -     Nitrofurantoin Monohyd Macro; Take 1 capsule (100 mg total) by mouth 2 (two) times daily for 7 days.  Dispense: 14 capsule; Refill: 0  Type 2 diabetes mellitus with hyperglycemia, with long-term current use of insulin (HCC) -     Tirzepatide; Inject 10 mg into the skin once a week.  Dispense: 2 mL; Refill: 0 -     NovoLIN 70/30 FlexPen; Inject 30 Units into the skin 2 (two) times daily with a meal.  Dispense: 3 mL; Refill: 1  Other insomnia Assessment & Plan: The patient complains of difficulty with sleep initiation and maintenance. Sleep hygiene practices were reviewed, and the patient verbalized understanding. Will initiate therapy with Trazodone 100 mg at bedtime.    Orders: -     traZODone HCl; Take 1 tablet (100 mg total) by mouth at bedtime.  Dispense: 60 tablet; Refill: 1  Paroxysmal atrial fibrillation [facility]) Assessment & Plan: Referral placed to cardiology  Orders: -     Ambulatory referral to Cardiology  IFG (impaired fasting glucose)  Vitamin D deficiency -     VITAMIN D  25 Hydroxy (Vit-D Deficiency, Fractures)  TSH (thyroid-stimulating hormone deficiency) -     TSH + free T4  Hyperlipidemia associated with type 2 diabetes mellitus (HCC) -     Lipid panel -     CMP14+EGFR -     CBC with Differential/Platelet   Note: This chart has been completed using Engineer, civil (consulting) software, and while attempts have been made to ensure accuracy, certain words and phrases may not be transcribed as intended.   Follow-up: Return in about 4 months (around 03/19/2024).   Amy Midkiff, FNP

## 2023-11-18 NOTE — Patient Instructions (Addendum)
 I appreciate the opportunity to provide care to you today!    Follow up:  4 months  Labs: please stop by the lab during the eek to get your blood drawn (CBC, CMP, TSH, Lipid profile, Vit D)  Blood Pressure You may discontinue Lisinopril 0.5 mg daily. You may continue taking Metoprolol 25 mg twice daily. Please inform me if your blood pressure is elevated to 140/90 mmHg or higher.  Type II Diabetes Start Novolin 70/30 insulin, 30 units twice daily with meals. Monitor blood sugar before administering insulin. Do not administer insulin if blood sugar is below 70 mg/dL. You may also begin Mounjaro (tirzepatide) 10 mg once weekly.  Symptoms of hypoglycemia include: Shakiness, sweating, dizziness, lightheadedness, hunger Irritability, anxiety, confusion Weakness, fatigue, blurred vision Loss of consciousness (in severe cases) If blood glucose is <70 mg/dL and you are conscious, follow the 15-15 rule: Consume 15 grams of fast-acting carbohydrates (e.g., 4 oz juice, regular soda, glucose tablets, 1 tbsp honey or sugar). Recheck blood glucose in 15 minutes. Repeat if still <70 mg/dL.  Serous Otitis Media Referral placed to ENT for further evaluation. Recommendations: Keep the ear dry and protected from water. Avoid irritants such as alcohol-based ear products.  Atrial Fibrillation (A-Fib) Referral placed to cardiology for further evaluation and management.  Urinary Tract Infection (UTI) Start Macrobid 100 mg twice daily for 7 days. Complete the full course of antibiotics. To help prevent future UTIs: Avoid holding urine for prolonged periods. Empty your bladder promptly when you feel the urge. Urinate after intercourse to help flush out bacteria. Shower instead of taking baths to reduce exposure to bacteria. Wipe front to back after urination and bowel movements. Stay well hydrated by drinking a full glass of water regularly.   Referrals today-  Cardiology and ENT     Please  continue to a heart-healthy diet and increase your physical activities. Try to exercise for at least five days a week.    It was a pleasure to see you and I look forward to continuing to work together on your health and well-being. Please do not hesitate to call the office if you need care or have questions about your care.  In case of emergency, please visit the Emergency Department for urgent care, or contact our clinic at [phone] to schedule an appointment. We're here to help you!   Have a wonderful day and week. With Gratitude, Gilmore Laroche MSN, FNP-BC

## 2023-11-19 DIAGNOSIS — H6590 Unspecified nonsuppurative otitis media, unspecified ear: Secondary | ICD-10-CM | POA: Insufficient documentation

## 2023-11-19 NOTE — Assessment & Plan Note (Signed)
 The patient complains of a chronic history of clear, waxy drainage from both ears. She denies any allergies, upper respiratory infections, ear pain, hearing loss, or fever. The patient is requesting a referral to ENT, as the symptoms have been present for over 8 years. Referral to ENT has been placed for further evaluation.  Recommendations: Keep the ears dry and protected from water. Avoid irritants such as alcohol-based ear products.

## 2023-11-19 NOTE — Assessment & Plan Note (Signed)
 UA in the clinic is indicative of UTI Encouraged to start taking Macrobid 100 mg twice daily for 7 days. Complete the full course of antibiotics. To help prevent future UTIs: Avoid holding urine for prolonged periods. Empty your bladder promptly when you feel the urge. Urinate after intercourse to help flush out bacteria. Shower instead of taking baths to reduce exposure to bacteria. Wipe front to back after urination and bowel movements. Stay well hydrated by drinking a full glass of water regularly.

## 2023-11-19 NOTE — Assessment & Plan Note (Signed)
Referral placed to cardiology 

## 2023-11-19 NOTE — Assessment & Plan Note (Addendum)
 The patient complains of feeling dizzy, shaky, and sweaty at times. The likely cause may be hypoglycemic episodes. Will decrease the dose of  insulin adm The patient was encouraged to start taking Novolin 70/30 insulin, 30 units twice daily with meals. She was advised to monitor her blood sugar before administering insulin and not to administer insulin if blood sugar is below 70 mg/dL. She may also begin Mounjaro (tirzepatide) 10 mg once weekly. Symptoms of hypoglycemia were discussed, including: Shakiness, sweating, dizziness, lightheadedness, hunger Irritability, anxiety, confusion Weakness, fatigue, blurred vision Loss of consciousness (in severe cases) If blood glucose is <70 mg/dL and the patient is conscious, she should follow the 15-15 rule: Consume 15 grams of fast-acting carbohydrates (e.g., 4 oz juice, regular soda, glucose tablets, 1 tbsp honey or sugar) Recheck blood glucose in 15 minutes Repeat if still <70 mg/dL

## 2023-11-19 NOTE — Assessment & Plan Note (Signed)
 The patient complained of low blood pressure. Blood pressure was well controlled in the clinic today, and the patient is asymptomatic. The patient was encouraged to discontinue Lisinopril 0.5 mg daily and continue taking Metoprolol 25 mg twice daily. She was advised to inform the provider if her blood pressure becomes elevated to 140/90 mmHg or higher.

## 2023-11-20 DIAGNOSIS — G4700 Insomnia, unspecified: Secondary | ICD-10-CM | POA: Insufficient documentation

## 2023-11-20 NOTE — Assessment & Plan Note (Signed)
 The patient complains of difficulty with sleep initiation and maintenance. Sleep hygiene practices were reviewed, and the patient verbalized understanding. Will initiate therapy with Trazodone 100 mg at bedtime.

## 2023-11-21 ENCOUNTER — Ambulatory Visit: Payer: Self-pay | Admitting: Family Medicine

## 2023-11-21 ENCOUNTER — Inpatient Hospital Stay: Admitting: Family Medicine

## 2023-11-21 NOTE — Telephone Encounter (Signed)
 Chief Complaint: Elevated blood glucose (296 right now)  Symptoms: Dizziness (ongoing issue) Pertinent Negatives: Patient denies difficulty breathing (current RR 18), frequent urination, fever  Disposition: [x] Home Care  Additional Notes: Spoke with Amy Livingston PTA, with Home Health. This RN educated pt on home care, new-worsening symptoms, when to call back/seek emergent care. Pt verbalized understanding and agrees to plan.     Copied from CRM [id number]. Topic: Clinical - Red Word Triage >> Nov 21, 2023  1:27 PM Amy Livingston wrote: Red Word that prompted transfer to Nurse Triage:  Amy Livingston with Home Health is with Amy Livingston and her blood sugar level is at 296. [facility] has not filled her tirzepatide [facility]) 10 MG/0.5ML Pen Reason for Disposition  [1] Blood glucose 240 - 300 mg/dL (13.3 - 16.7 mmol/L) AND [2] uses insulin (e.g., insulin-dependent, all people with type 1 diabetes)  Answer Assessment - Initial Assessment Questions 211, 11:30 AM before first meal; 296 right now, 1:30 PM Usually 160-170 Last night it was 90 when went to bed Type 2 diabetes Takes insulin- has not missed any doses; lowered 40 to 30 units last Fri and increased the mounjaro  Protocols used: Diabetes - High Blood Sugar-A-AH

## 2023-11-22 LAB — URINALYSIS
Bilirubin, UA: NEGATIVE
Ketones, UA: NEGATIVE
Nitrite, UA: NEGATIVE
Protein,UA: NEGATIVE
Specific Gravity, UA: 1.015 (ref 1.005–1.030)
Urobilinogen, Ur: 0.2 mg/dL (ref 0.2–1.0)
pH, UA: 6 (ref 5.0–7.5)

## 2023-11-23 ENCOUNTER — Encounter: Payer: Self-pay | Admitting: *Deleted

## 2023-11-23 ENCOUNTER — Other Ambulatory Visit: Payer: Self-pay | Admitting: *Deleted

## 2023-11-23 ENCOUNTER — Ambulatory Visit: Payer: Self-pay | Admitting: *Deleted

## 2023-11-23 NOTE — Telephone Encounter (Signed)
 Copied from CRM [id number]. Topic: Clinical - Red Word Triage >> Nov 23, 2023  2:49 PM Fredrica W wrote: Red Word that prompted transfer to Nurse Triage: Dizzy - fell last night - requesting meclizine (ANTIVERT) 25 MG tablet and wheelchair requested by physical therapy to be sent to [facility] Reason for Disposition  [1] Dizziness caused by heat exposure, sudden standing, or poor fluid intake AND [2] no improvement after 2 hours of rest and fluids  Answer Assessment - Initial Assessment Questions 1. DESCRIPTION: "Describe your dizziness."     I'm dizzy and fell last night.   I'm sore today.    My knees just gave out.   I don't want to fall again.   I don't want to get up and move. 2. LIGHTHEADED: "Do you feel lightheaded?" (e.g., somewhat faint, woozy, weak upon standing)     I passed out with dizziness and was in the hospital for 7 days.   I had an infection.    My BP is going up and down.   It's been on and off for a year.   It's severe now.    I have Meclizine that is 67-67 years old.    My nurse that came out for today said I needed a new rx.   It used to help not sure if it will now or not.      I also need a wheelchair per my PT.    I stand up for 30 seconds and the dizziness hits me.    3. VERTIGO: "Do you feel like either you or the room is spinning or tilting?" (i.e. vertigo)     I used to have vertigo.   I don't think it's that now.    It's BP or fluid in my ears now.    I have an ENT and heart dr for that she sent referrals for those two.    4. SEVERITY: "How bad is it?"  "Do you feel like you are going to faint?" "Can you stand and walk?"   - MILD: Feels slightly dizzy, but walking normally.   - MODERATE: Feels unsteady when walking, but not falling; interferes with normal activities (e.g., school, work).   - SEVERE: Unable to walk without falling, or requires assistance to walk without falling; feels like passing out now.      Severe.    Gets dizzy when she stands up.    I saw Dr.  Denny Levy Friday.    5. ONSET:  "When did the dizziness begin?"     For the last year.    6. AGGRAVATING FACTORS: "Does anything make it worse?" (e.g., standing, change in head position)     Standing up now. 7. HEART RATE: "Can you tell me your heart rate?" "How many beats in 15 seconds?"  (Note: not all patients can do this)       Not asked 8. CAUSE: "What do you think is causing the dizziness?"     They don't know 9. RECURRENT SYMPTOM: "Have you had dizziness before?" If Yes, ask: "When was the last time?" "What happened that time?"     Yes 10. OTHER SYMPTOMS: "Do you have any other symptoms?" (e.g., fever, chest pain, vomiting, diarrhea, bleeding)       No nausea or headaches with the dizziness. I'm not passing out at this point. 11. PREGNANCY: "Is there any chance you are pregnant?" "When was your last menstrual period?"       N/A  Protocols used: Dizziness - Lightheadedness-A-AH  Chief Complaint: very dizzy.   Fell last night without injuries just sore today.  Her knees buckled.   She used to take Antivert and is requesting a refill of this.   Hers is 68 yrs old.   She saw Dr. Zarwolo on Friday so did not schedule another appt. Symptoms: Very dizzy when she stands up.   Can't walk around.   Her PT has recommended a wheelchair so is requesting an rx for that. Frequency: Every time she stands up.   She has family there who is helping her now every time she gets up. Pertinent Negatives: Patient denies injuries during her fall last night.   Just sore. Disposition: [] ED /[] Urgent Care (no appt availability in office) / [] Appointment(In office/virtual)/ []  [place] [facility]/ [] Home Care/ [] Refused Recommended Disposition /[] [place] [facility]/ [x]  Follow-up with PCP Additional Notes: Message sent to Dr. Zarwolo for Antivert to be sent to [facility] on [address].

## 2023-11-24 ENCOUNTER — Other Ambulatory Visit: Payer: Self-pay | Admitting: Family Medicine

## 2023-11-24 DIAGNOSIS — Z794 Long term (current) use of insulin: Secondary | ICD-10-CM

## 2023-11-24 NOTE — Telephone Encounter (Signed)
 Please inform the patient that a referral has been placed to endocrinology and I encouraged that she follow-up for management of her insulin and type II diabetes.

## 2023-11-28 ENCOUNTER — Ambulatory Visit
Admission: RE | Admit: 2023-11-28 | Discharge: 2023-11-28 | Disposition: A | Discharge status: Home / Self Care | Source: Ambulatory Visit | Attending: Nurse Practitioner | Admitting: Nurse Practitioner

## 2023-11-28 ENCOUNTER — Ambulatory Visit: Payer: Self-pay

## 2023-11-28 VITALS — BP 108/61 | HR 119 | Temp 97.7°F | Resp 19

## 2023-11-28 DIAGNOSIS — R198 Other specified symptoms and signs involving the digestive system and abdomen: Secondary | ICD-10-CM | POA: Insufficient documentation

## 2023-11-28 DIAGNOSIS — R399 Unspecified symptoms and signs involving the genitourinary system: Secondary | ICD-10-CM | POA: Insufficient documentation

## 2023-11-28 LAB — POCT URINALYSIS DIP (MANUAL ENTRY)
Bilirubin, UA: NEGATIVE
Glucose, UA: NEGATIVE mg/dL
Ketones, POC UA: NEGATIVE mg/dL
Nitrite, UA: NEGATIVE
Protein Ur, POC: NEGATIVE mg/dL
Spec Grav, UA: 1.015
Urobilinogen, UA: 0.2 U/dL
pH, UA: 6.5

## 2023-11-28 MED ORDER — CEPHALEXIN 500 MG PO CAPS: 500.0000 mg | ORAL_CAPSULE | Freq: Four times a day (QID) | ORAL | 0 refills | Status: DC

## 2023-11-28 MED ORDER — FAMOTIDINE 20 MG PO TABS: 20.0000 mg | ORAL_TABLET | Freq: Every day | ORAL | 0 refills | Status: DC

## 2023-11-28 MED ORDER — SUCRALFATE 1 G PO TABS: 1.0000 g | ORAL_TABLET | Freq: Three times a day (TID) | ORAL | 0 refills | Status: DC

## 2023-11-28 NOTE — Telephone Encounter (Signed)
 First attempt to contact pt. To inform no answer. Lvm for call back.

## 2023-11-28 NOTE — ED Provider Notes (Signed)
 [facility]    CSN: [id number] Arrival date & time: 11/28/23  1657      History   Chief Complaint Chief Complaint  Patient presents with   Nausea    Heartburn medicine not working - Entered by patient    HPI Amy Livingston is a 68 y.o. female.   The history is provided by the patient.   Patient presents for ongoing urinary symptoms along with worsening acid reflux symptoms.  Patient states that she was recently treated and discharged from the hospital for a UTI, she was also seen by her PCP over the past 2 weeks and was again treated for UTI.  Patient states she continues to experience suprapubic pressure with urination and urinary frequency and urgency.  She was previously prescribed Macrobid  for her symptoms.  She is unsure if a urine culture was performed at that time.  She denies fever, chills, hematuria, dysuria, flank pain, low back pain, or abdominal pain.  She reports over the past several days, she has had worsening acid reflux symptoms.  States that she has been vomiting for the past 2 days.  States that she did take some of her family members famotidine  and Carafate  with good relief of her symptoms.  She is prescribed omeprazole  for reflux, states that the medicine has not helped her symptoms.  She states that she has been on the omeprazole  for quite some time.  Patient is requesting prescriptions for famotidine  and Carafate .  States that she last took the medication this morning and has not had any episodes of nausea or vomiting or reflux symptoms. Past Medical History:  Diagnosis Date   Anxiety    Atrial fibrillation (HCC)    Diabetes mellitus without complication (HCC)    Dysrhythmia    GERD (gastroesophageal reflux disease)    Hypertension    PONV (postoperative nausea and vomiting)    Shortness of breath dyspnea     Patient Active Problem List   Diagnosis Date Noted   Insomnia 11/20/2023   Serous otitis media 11/19/2023   UTI (urinary tract  infection) 11/05/2023   Bacteremia due to other bacteria 10/13/2023   Paroxysmal atrial fibrillation (HCC) 10/09/2023   Anxiety and depression 10/09/2023   Syncope and collapse 10/09/2023   BRBPR (bright red blood per rectum) 10/09/2023   Enteritis due to Clostridium difficile 10/09/2023   Diarrhea of infectious origin 10/09/2023   Sepsis due to gram-negative UTI (HCC) 10/08/2023   Hypokalemia 10/08/2023   Hyponatremia 10/08/2023   Uncontrolled type 2 diabetes mellitus with hyperglycemia, with long-term current use of insulin  (HCC) 10/08/2023   Essential hypertension 10/08/2023   Peripheral neuropathy 10/08/2023   Overactive bladder 06/17/2023   Generalized anxiety disorder 06/17/2023   Type 2 diabetes mellitus with diabetic neuropathy, unspecified (HCC) 06/17/2023   Hypertension 06/01/2023   Urinary frequency 06/01/2023   Bowel incontinence 06/01/2023   Generalized weakness 06/01/2023   Encounter for immunization 06/01/2023   Diabetes 1.5, 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   PAF (paroxysmal atrial fibrillation) (HCC) 08/14/2020   Dyslipidemia 07/01/2020   Type 2 diabetes mellitus with hyperglycemia (HCC) 07/01/2020   Body mass index (BMI) 40.0-44.9, adult (HCC) 11/07/2019   Elevated blood-pressure reading, without diagnosis of hypertension 11/07/2019   Spondylolisthesis, lumbar region 07/18/2018   Cervical spondylosis with myelopathy 01/13/2016   Cervical disc disorder with myelopathy 12/04/2015   Neck pain 12/04/2015    Past Surgical History:  Procedure Laterality Date   ABDOMINAL HYSTERECTOMY     ANTERIOR CERVICAL  DECOMP/DISCECTOMY FUSION N/A 01/13/2016   Procedure: C5-6 ANTERIOR CERVICAL DECOMPRESSION/DISKECTOMY/FUSION;  Surgeon: Elna Haggis, MD;  Location: [facility];  Service: Neurosurgery;  Laterality: N/A;  C5-6 ANTERIOR CERVICAL DECOMPRESSION/DISKECTOMY/FUSION   BACK SURGERY     x 5   BIOPSY  07/25/2023   Procedure: BIOPSY;  Surgeon: Vinetta Greening, DO;   Location: [facility];  Service: Endoscopy;;   CHOLECYSTECTOMY     COLONOSCOPY WITH PROPOFOL  N/A 07/25/2023   Procedure: COLONOSCOPY WITH PROPOFOL ;  Surgeon: Vinetta Greening, DO;  Location: [facility];  Service: Endoscopy;  Laterality: N/A;  10:45 am, asa 3   ESOPHAGEAL BRUSHING  07/25/2023   Procedure: ESOPHAGEAL BRUSHING;  Surgeon: Vinetta Greening, DO;  Location: [facility];  Service: Endoscopy;;   ESOPHAGOGASTRODUODENOSCOPY (EGD) WITH PROPOFOL  N/A 07/25/2023   Procedure: ESOPHAGOGASTRODUODENOSCOPY (EGD) WITH PROPOFOL ;  Surgeon: Vinetta Greening, DO;  Location: [facility];  Service: Endoscopy;  Laterality: N/A;   FOOT SURGERY     KNEE ARTHROSCOPY     left   KNEE SURGERY Right    POLYPECTOMY  07/25/2023   Procedure: POLYPECTOMY INTESTINAL;  Surgeon: Vinetta Greening, DO;  Location: [facility];  Service: Endoscopy;;   TEE WITHOUT CARDIOVERSION N/A 10/13/2023   Procedure: ECHOCARDIOGRAM, TRANSESOPHAGEAL;  Surgeon: Lasalle Pointer, MD;  Location: [facility];  Service: Cardiovascular;  Laterality: N/A;   TUBAL LIGATION      OB History   No obstetric history on file.      Home Medications    Prior to Admission medications   Medication Sig Start Date End Date Taking? Authorizing Provider  cephALEXin  (KEFLEX ) 500 MG capsule Take 1 capsule (500 mg total) by mouth 4 (four) times daily. 11/28/23  Yes Leath-Warren, Belen Bowers, NP  famotidine  (PEPCID ) 20 MG tablet Take 1 tablet (20 mg total) by mouth daily. 11/28/23  Yes Leath-Warren, Belen Bowers, NP  sucralfate  (CARAFATE ) 1 g tablet Take 1 tablet (1 g total) by mouth 4 (four) times daily -  with meals and at bedtime. 11/28/23  Yes Leath-Warren, Belen Bowers, NP  acetaminophen  (TYLENOL ) 325 MG tablet Take 2 tablets (650 mg total) by mouth every 6 (six) hours as needed for mild pain (pain score 1-3), headache or fever (or Fever >/= 101). 10/14/23   Colin Dawley, MD  atorvastatin  (LIPITOR) 80 MG tablet Take 1 tablet (80 mg total)  by mouth at bedtime. 11/06/23   Johnson, Clanford L, MD  busPIRone  (BUSPAR ) 10 MG tablet Take 1 tablet (10 mg total) by mouth 2 (two) times daily. 06/16/23   Zarwolo, Gloria, FNP  Cholecalciferol (VITAMIN D -3) 25 MCG (1000 UT) CAPS Take 1,000 Units by mouth 2 (two) times daily at 10 am and 4 pm.    [provider]  cyclobenzaprine  (FLEXERIL ) 10 MG tablet Take 10 mg by mouth every 12 (twelve) hours.    [provider]  ELIQUIS  5 MG TABS tablet Take 5 mg by mouth 2 (two) times daily.    [provider]  fluconazole  (DIFLUCAN ) 150 MG tablet Take 1 tablet (150 mg total) by mouth daily. 11/10/23   Orlie Bjornstad, MD  gabapentin  (NEURONTIN ) 300 MG capsule Take 1 capsule (300 mg total) by mouth 3 (three) times daily. 09/22/23   Zarwolo, Gloria, FNP  insulin  isophane & regular human KwikPen (NOVOLIN  70/30 KWIKPEN) (70-30) 100 UNIT/ML KwikPen Inject 30 Units into the skin 2 (two) times daily with a meal. 11/18/23   Zarwolo, Gloria, FNP  lisinopril  (ZESTRIL ) 5 MG  tablet Take 0.5 tablets (2.5 mg total) by mouth daily. 11/07/23   Johnson, Clanford L, MD  meclizine  (ANTIVERT ) 25 MG tablet Take 25 mg by mouth daily as needed for dizziness.    [provider]  metoprolol  tartrate (LOPRESSOR ) 50 MG tablet Take 0.5 tablets (25 mg total) by mouth 2 (two) times daily. 11/06/23   Johnson, Clanford L, MD  mirabegron  ER (MYRBETRIQ ) 25 MG TB24 tablet Take 1 tablet (25 mg total) by mouth at bedtime. 11/06/23   Johnson, Clanford L, MD  omeprazole  (PRILOSEC) 40 MG capsule TAKE 1 CAPSULE(40 MG) BY MOUTH DAILY 11/11/23   Zarwolo, Gloria, FNP  ondansetron  (ZOFRAN ) 4 MG tablet Take 1 tablet (4 mg total) by mouth every 6 (six) hours as needed for nausea. 10/14/23   Colin Dawley, MD  oxyCODONE -acetaminophen  (PERCOCET) 10-325 MG tablet Take 1 tablet by mouth every 8 (eight) hours as needed for pain.    [provider]  sertraline  (ZOLOFT ) 50 MG tablet Take 1 tablet (50 mg total) by mouth at  bedtime. 11/06/23   Johnson, Clanford L, MD  tirzepatide [facility]) 10 MG/0.5ML Pen Inject 10 mg into the skin once a week. 11/18/23   Zarwolo, Gloria, FNP  traZODone  (DESYREL ) 100 MG tablet Take 1 tablet (100 mg total) by mouth at bedtime. 11/18/23   Zarwolo, Gloria, FNP    Family History History reviewed. No pertinent family history.  Social History Social History   Tobacco Use   Smoking status: Never   Smokeless tobacco: Never  Substance Use Topics   Alcohol use: No   Drug use: No     Allergies   Elemental sulfur and Metformin  and related   Review of Systems Review of Systems Per HPI  Physical Exam Triage Vital Signs ED Triage Vitals  Encounter Vitals Group     BP 11/28/23 1708 108/61     Systolic BP Percentile --      Diastolic BP Percentile --      Pulse Rate 11/28/23 1708 (!) 119     Resp 11/28/23 1708 19     Temp 11/28/23 1708 97.7 F (36.5 C)     Temp Source 11/28/23 1708 Oral     SpO2 11/28/23 1708 95 %     Weight --      Height --      Head Circumference --      Peak Flow --      Pain Score 11/28/23 1713 0     Pain Loc --      Pain Education --      Exclude from Growth Chart --    No data found.  Updated Vital Signs BP 108/61 (BP Location: Right Arm)   Pulse (!) 119   Temp 97.7 F (36.5 C) (Oral)   Resp 19   SpO2 95%   Visual Acuity Right Eye Distance:   Left Eye Distance:   Bilateral Distance:    Right Eye Near:   Left Eye Near:    Bilateral Near:     Physical Exam Vitals and nursing note reviewed.  Constitutional:      General: She is not in acute distress.    Appearance: Normal appearance.  HENT:     Head: Normocephalic.  Eyes:     Extraocular Movements: Extraocular movements intact.     Conjunctiva/sclera: Conjunctivae normal.     Pupils: Pupils are equal, round, and reactive to light.  Cardiovascular:     Rate and Rhythm: Regular rhythm. Tachycardia present.  Pulses: Normal pulses.     Heart sounds: Normal heart sounds.   Pulmonary:     Effort: Pulmonary effort is normal. No respiratory distress.     Breath sounds: Normal breath sounds. No stridor. No wheezing, rhonchi or rales.  Abdominal:     General: Bowel sounds are normal.     Palpations: Abdomen is soft.     Tenderness: There is no abdominal tenderness. There is no right CVA tenderness or left CVA tenderness.  Musculoskeletal:     Cervical back: Normal range of motion.  Lymphadenopathy:     Cervical: No cervical adenopathy.  Skin:    General: Skin is warm and dry.  Neurological:     General: No focal deficit present.     Mental Status: She is alert and oriented to person, place, and time.  Psychiatric:        Mood and Affect: Mood normal.        Behavior: Behavior normal.      UC Treatments / Results  Labs (all labs ordered are listed, but only abnormal results are displayed) Labs Reviewed  POCT URINALYSIS DIP (MANUAL ENTRY) - Abnormal; Notable for the following components:      Result Value   Color, UA straw (*)    Clarity, UA turbid (*)    Blood, UA trace-intact (*)    Leukocytes, UA Large (3+) (*)    All other components within normal limits    EKG   Radiology No results found.  Procedures Procedures (including critical care time)  Medications Ordered in UC Medications - No data to display  Initial Impression / Assessment and Plan / UC Course  I have reviewed the triage vital signs and the nursing notes.  Pertinent labs & imaging results that were available during my care of the patient were reviewed by me and considered in my medical decision making (see chart for details).  Will start patient on Pepcid  20 mg and Carafate  1 g for reflux symptoms.  Supportive care recommendations were provided and discussed with the patient to include avoiding acid reflux triggers, eating 6 small meals instead of 3 large meals, eating at least 2 to 3 hours before bedtime, and chewing her food well.  With regard to her UTI symptoms, she  does have 3+ leukocytes on her urinalysis.  Urine culture is pending.  In the interim, we will start Keflex  500 mg 4 times daily for the next 7 days.  Supportive care recommendations were provided and discussed regarding the urinary symptoms to include fluids, rest, voiding every 2 hours, and to avoid caffeine.  Patient was given strict ER follow-up precautions along with indications to follow-up with her PCP.  Patient was in agreement with this plan of care and verbalizes understanding.  All questions were answered.  Patient stable for discharge.  Final Clinical Impressions(s) / UC Diagnoses   Final diagnoses:  UTI symptoms  Symptoms of gastroesophageal reflux     Discharge Instructions      A urine culture has been ordered.  You will be contacted if the pending test result is abnormal.  You will also be contacted if the result of the culture and advised to stop the medication. Take medication as prescribed. Increase fluids and allow for plenty of rest.  Try to drink at least 8-10 8 ounce glasses of water daily. May take over-the-counter Tylenol  as needed for pain, fever, or general discomfort. Avoid caffeine while urinary symptoms persist.  This includes tea, soda, or coffee.  Make sure you are urinating at least every 2 hours while symptoms persist. If symptoms fail to improve with this treatment, please follow-up with your PCP for further evaluation.  For your reflux: Make sure you make dietary modifications to prevent worsening reflux symptoms. Avoid triggers to include spicy foods, tomato-based foods, dairy, caffeine, mint based foods, or red meat. Make sure you are eating at least 2 to 3 hours before bedtime. Recommend eating at least 6 small meals per day versus 3 large meals. Make sure you are chewing your food well. Please follow-up with your PCP if symptoms fail to improve with this treatment.  Follow-up as needed.     ED Prescriptions     Medication Sig Dispense Auth.  Provider   sucralfate  (CARAFATE ) 1 g tablet Take 1 tablet (1 g total) by mouth 4 (four) times daily -  with meals and at bedtime. 45 tablet Leath-Warren, Belen Bowers, NP   famotidine  (PEPCID ) 20 MG tablet Take 1 tablet (20 mg total) by mouth daily. 30 tablet Leath-Warren, Belen Bowers, NP   cephALEXin  (KEFLEX ) 500 MG capsule Take 1 capsule (500 mg total) by mouth 4 (four) times daily. 28 capsule Leath-Warren, Belen Bowers, NP      PDMP not reviewed this encounter.   Hardy Lia, NP 11/28/23 1759

## 2023-11-28 NOTE — ED Triage Notes (Signed)
 Pt reports having acid reflux x 3 days and pt has been using famotidine  and sulcarfate, given to her by her sister in law  to help with the acid reflux.

## 2023-11-28 NOTE — Discharge Instructions (Addendum)
 A urine culture has been ordered.  You will be contacted if the pending test result is abnormal.  You will also be contacted if the result of the culture and advised to stop the medication. Take medication as prescribed. Increase fluids and allow for plenty of rest.  Try to drink at least 8-10 8 ounce glasses of water daily. May take over-the-counter Tylenol  as needed for pain, fever, or general discomfort. Avoid caffeine while urinary symptoms persist.  This includes tea, soda, or coffee. Make sure you are urinating at least every 2 hours while symptoms persist. If symptoms fail to improve with this treatment, please follow-up with your PCP for further evaluation.  For your reflux: Make sure you make dietary modifications to prevent worsening reflux symptoms. Avoid triggers to include spicy foods, tomato-based foods, dairy, caffeine, mint based foods, or red meat. Make sure you are eating at least 2 to 3 hours before bedtime. Recommend eating at least 6 small meals per day versus 3 large meals. Make sure you are chewing your food well. Please follow-up with your PCP if symptoms fail to improve with this treatment.  Follow-up as needed.

## 2023-11-28 NOTE — Telephone Encounter (Signed)
  Chief Complaint: Vomiting from acid reflux Symptoms: nausea/vomiting, heartburn Frequency: intermittent X 3 days Pertinent Negatives: Patient denies chest pain, fever, difficulty breathing Disposition: [] ED /[x] Urgent Care (no appt availability in office) / [] Appointment(In office/virtual)/ []  [facility]/ [] Home Care/ [] Refused Recommended Disposition /[] [facility]/ []  Follow-up with PCP Additional Notes:  2-3 days of vomiting due to acid reflux. She is on omeprazole  daily but not helpful. She took a dose of her daughters medications famotadine and carafate  which helped her a lot, she is requesting to change her medication. In addition to omeprazole  she used Mylanta and Tums without relief. Denies chest pain, denies abdominal pain, afebrile. Denies all other symptoms. No acute visits available, she will proceed to urgent care today.       Message from Culbertson L sent at 11/28/2023  3:25 PM EDT  Summary: Vomiting - due to acid reflux   Copied From CRM #[id number]. Reason for Triage: Patient's acid reflux has caused her to vomit for two days going on 3 days. Would like to know if she can be put on something new for acid reflux. Requesting to be put on famotidine  and sucralfate .      Reason for Disposition  [1] MILD or MODERATE vomiting AND [2] present > 48 hours (2 days) (Exception: Mild vomiting with associated diarrhea.)  Protocols used: Vomiting-A-AH

## 2023-11-29 NOTE — Telephone Encounter (Signed)
Noted patient going to urgent care.

## 2023-11-30 LAB — URINE CULTURE: Culture: NO GROWTH

## 2023-12-05 ENCOUNTER — Other Ambulatory Visit: Payer: Self-pay | Admitting: Family Medicine

## 2023-12-05 ENCOUNTER — Ambulatory Visit: Payer: Self-pay

## 2023-12-05 ENCOUNTER — Telehealth: Payer: Self-pay | Admitting: *Deleted

## 2023-12-05 ENCOUNTER — Encounter (INDEPENDENT_AMBULATORY_CARE_PROVIDER_SITE_OTHER): Payer: Self-pay | Admitting: Otolaryngology

## 2023-12-05 DIAGNOSIS — E1140 Type 2 diabetes mellitus with diabetic neuropathy, unspecified: Secondary | ICD-10-CM

## 2023-12-05 NOTE — Telephone Encounter (Signed)
 Copied from CRM [id number]. Topic: Clinical - Red Word Triage >> Dec 05, 2023 11:43 AM Crispin Dolphin wrote: Red Word that prompted transfer to Nurse Triage: dizzy and falling a lot - need medication and referral     Chief Complaint: On going dizziness. Asking for Meclizine , wheelchair(per PT request) , ENT referral, referral to [facility] for pool access. Symptoms: Dizziness, has fallen "several ties." Frequency: Several months. Pertinent Negatives: Patient denies any other symptoms Disposition: [] ED /[] Urgent Care (no appt availability in office) / [] Appointment(In office/virtual)/ []  [facility]/ [] Home Care/ [] Refused Recommended Disposition /[] [facility]/ [x]  Follow-up with PCP Additional Notes: Please advise pt.  Reason for Disposition  [1] MODERATE dizziness (e.g., interferes with normal activities) AND [2] has NOT been evaluated by doctor (or NP/PA) for this  (Exception: Dizziness caused by heat exposure, sudden standing, or poor fluid intake.)  Answer Assessment - Initial Assessment Questions 1. DESCRIPTION: "Describe your dizziness."     Dizzy 2. LIGHTHEADED: "Do you feel lightheaded?" (e.g., somewhat faint, woozy, weak upon standing)     Yes 3. VERTIGO: "Do you feel like either you or the room is spinning or tilting?" (i.e. vertigo)     no 4. SEVERITY: "How bad is it?"  "Do you feel like you are going to faint?" "Can you stand and walk?"   - MILD: Feels slightly dizzy, but walking normally.   - MODERATE: Feels unsteady when walking, but not falling; interferes with normal activities (e.g., school, work).   - SEVERE: Unable to walk without falling, or requires assistance to walk without falling; feels like passing out now.      Moderate 5. ONSET:  "When did the dizziness begin?"     1 year 6. AGGRAVATING FACTORS: "Does anything make it worse?" (e.g., standing, change in head position)     Movement 7. HEART RATE: "Can you tell me your heart rate?" "How many beats  in 15 seconds?"  (Note: not all patients can do this)       no 8. CAUSE: "What do you think is causing the dizziness?"     Unsure 9. RECURRENT SYMPTOM: "Have you had dizziness before?" If Yes, ask: "When was the last time?" "What happened that time?"     Yes 10. OTHER SYMPTOMS: "Do you have any other symptoms?" (e.g., fever, chest pain, vomiting, diarrhea, bleeding)       no 11. PREGNANCY: "Is there any chance you are pregnant?" "When was your last menstrual period?"       no  Protocols used: Dizziness - Lightheadedness-A-AH

## 2023-12-05 NOTE — Progress Notes (Unsigned)
 Health Equity Plan  Care Guide Note  12/05/2023 Name: Amy Livingston MRN: [id number] DOB: [birth year]  Amy Livingston is a 68 y.o. year old female who is a primary care patient of Zarwolo, Gloria, FNP and is actively engaged with the care management team. I reached out to Windsor Hatcher by phone today to assist with re-scheduling  with the RN Case Manager.  Follow up plan: Unsuccessful telephone outreach attempt made. A HIPAA compliant phone message was left for the patient providing contact information and requesting a return call.  Barnie Bora  [facility]  [facility], [facility] Guide  Direct Dial: [phone]  Fax [phone]

## 2023-12-05 NOTE — Telephone Encounter (Signed)
 Will need an appt with available provider since she is requesting med, wheelchair and referrals

## 2023-12-06 ENCOUNTER — Other Ambulatory Visit: Payer: Self-pay | Admitting: Family Medicine

## 2023-12-06 DIAGNOSIS — F411 Generalized anxiety disorder: Secondary | ICD-10-CM

## 2023-12-06 NOTE — Telephone Encounter (Signed)
 Contacted pt. She is aware to continue to follow up w/ Endo. She did report that she fell yesterday and that she did not feel good. She states that she did not hit her head but that if she is not feeling better by later today ,she will visit the proper evaluation office.  she has been instructed to visit the UC or ED. If things change. She is scheduled to see Rice Chamorro tomorrow.

## 2023-12-07 ENCOUNTER — Ambulatory Visit

## 2023-12-07 VITALS — BP 113/76 | HR 85 | Ht 66.0 in | Wt 182.0 lb

## 2023-12-07 DIAGNOSIS — N390 Urinary tract infection, site not specified: Secondary | ICD-10-CM

## 2023-12-07 DIAGNOSIS — Z9181 History of falling: Secondary | ICD-10-CM

## 2023-12-07 DIAGNOSIS — G609 Hereditary and idiopathic neuropathy, unspecified: Secondary | ICD-10-CM

## 2023-12-07 DIAGNOSIS — R42 Dizziness and giddiness: Secondary | ICD-10-CM

## 2023-12-07 MED ORDER — MECLIZINE HCL 25 MG PO TABS: 25.0000 mg | ORAL_TABLET | Freq: Every day | ORAL | 5 refills | Status: DC | PRN

## 2023-12-07 MED ORDER — D-MANNOSE 500 MG PO CAPS: 1.0000 | ORAL_CAPSULE | Freq: Every day | ORAL | 5 refills | Status: DC

## 2023-12-07 NOTE — Progress Notes (Signed)
 Complex Care Management Care Guide Note Health Equity Plan   12/07/2023 Name: Amy Livingston MRN: [id number] DOB: 05-May-1956  Amy Livingston is a 68 y.o. year old female who is a primary care patient of Zarwolo, Gloria, FNP and is actively engaged with the care management team. I reached out to Windsor Hatcher by phone today to assist with re-scheduling  with the RN Case Manager.  Follow up plan: Unsuccessful telephone outreach attempt made. A HIPAA compliant phone message was left for the patient providing contact information and requesting a return call. No further outreach attempts will be made at this time. We have been unable to contact the patient to reschedule for complex care management services.   Barnie Bora  [facility]  Value-Based Care Institute, [facility] Guide  Direct Dial: [phone]  Fax [phone]

## 2023-12-07 NOTE — Progress Notes (Signed)
 Established Patient Office Visit  Subjective   Patient ID: Amy Livingston, female    DOB: [birth year]  Age: 68 y.o. MRN: [id number]  Chief Complaint  Patient presents with   Medical Management of Chronic Issues    Pt here states "She been dizzy and falling a lot"    Dizziness Associated symptoms include myalgias and weakness.  Patient has been seen in the ER 3 times since January for UTI.  She was admitted for 6 days in March for sepsis due to UTI was also seen in our office earlier this month for dizziness weakness and possible UTI and was treated with Macrobid  patient also saw infectious disease on 4/3 and was treated with fluconazole .  Patient presents today with complaints of ongoing dizziness and weakness.  Past Medical History:  Diagnosis Date   Anxiety    Atrial fibrillation (HCC)    Diabetes mellitus without complication (HCC)    Dysrhythmia    GERD (gastroesophageal reflux disease)    Hypertension    PONV (postoperative nausea and vomiting)    Shortness of breath dyspnea    Past Surgical History:  Procedure Laterality Date   ABDOMINAL HYSTERECTOMY     ANTERIOR CERVICAL DECOMP/DISCECTOMY FUSION N/A 01/13/2016   Procedure: C5-6 ANTERIOR CERVICAL DECOMPRESSION/DISKECTOMY/FUSION;  Surgeon: Elna Haggis, MD;  Location: [facility];  Service: Neurosurgery;  Laterality: N/A;  C5-6 ANTERIOR CERVICAL DECOMPRESSION/DISKECTOMY/FUSION   BACK SURGERY     x 5   BIOPSY  07/25/2023   Procedure: BIOPSY;  Surgeon: Vinetta Greening, DO;  Location: [facility];  Service: Endoscopy;;   CHOLECYSTECTOMY     COLONOSCOPY WITH PROPOFOL  N/A 07/25/2023   Procedure: COLONOSCOPY WITH PROPOFOL ;  Surgeon: Vinetta Greening, DO;  [facility]: [facility];  Service: Endoscopy;  Laterality: N/A;  10:45 am, asa 3   ESOPHAGEAL BRUSHING  07/25/2023   Procedure: ESOPHAGEAL BRUSHING;  Surgeon: Vinetta Greening, DO;  [facility];  Service: Endoscopy;;   ESOPHAGOGASTRODUODENOSCOPY (EGD) WITH  PROPOFOL  N/A 07/25/2023   Procedure: ESOPHAGOGASTRODUODENOSCOPY (EGD) WITH PROPOFOL ;  Surgeon: Vinetta Greening, DO;  [facility];  Service: Endoscopy;  Laterality: N/A;   FOOT SURGERY     KNEE ARTHROSCOPY     left   KNEE SURGERY Right    POLYPECTOMY  07/25/2023   Procedure: POLYPECTOMY INTESTINAL;  Surgeon: Vinetta Greening, DO;  [facility];  Service: Endoscopy;;   TEE WITHOUT CARDIOVERSION N/A 10/13/2023   Procedure: ECHOCARDIOGRAM, TRANSESOPHAGEAL;  Surgeon: Lasalle Pointer, MD;  Location: [facility];  Service: Cardiovascular;  Laterality: N/A;   TUBAL LIGATION        Review of Systems  Constitutional:  Positive for malaise/fatigue.  Respiratory: Negative.    Cardiovascular: Negative.   Musculoskeletal:  Positive for myalgias.  Neurological:  Positive for dizziness and weakness.  Psychiatric/Behavioral: Negative.        Objective:     BP 113/76   Pulse 85   Ht 5\' 6"  (1.676 m)   Wt 182 lb 0.6 oz (82.6 kg)   SpO2 97%   BMI 29.38 kg/m  BP Readings from Last 3 Encounters:  12/07/23 113/76  11/28/23 108/61  11/18/23 113/73   Wt Readings from Last 3 Encounters:  12/07/23 182 lb 0.6 oz (82.6 kg)  11/18/23 202 lb 1.9 oz (91.7 kg)  11/10/23 204 lb (92.5 kg)      Physical Exam Vitals and nursing note reviewed.  Constitutional:      Appearance: Normal appearance.  HENT:     Head: Normocephalic.     Right Ear: Tympanic membrane, ear canal and external ear normal.     Left Ear: Tympanic membrane, ear canal and external ear normal.     Nose: Nose normal.     Mouth/Throat:     Mouth: Mucous membranes are moist.     Pharynx: Oropharynx is clear.  Cardiovascular:     Rate and Rhythm: Normal rate and regular rhythm.  Pulmonary:     Effort: Pulmonary effort is normal.     Breath sounds: Normal breath sounds.  Musculoskeletal:     Cervical back: Normal range of motion and neck supple.  Skin:    General: Skin is warm and dry.  Neurological:      Mental Status: She is alert and oriented to person, place, and time.     Gait: Gait abnormal (guarded gait, using a rolling walker w/out seat).  Psychiatric:        Mood and Affect: Mood normal.        Thought Content: Thought content normal.      No results found for any visits on 12/07/23.    The 10-year ASCVD risk score (Arnett DK, et al., 2019) is: 11.7%    Assessment & Plan:   Problem List Items Addressed This Visit       Nervous and Auditory   Peripheral neuropathy   Order for wheelchair placed patient reports increase in her neuropathy making it difficult for her to walk for short distances.  She states she will be able to propel herself in her home, and having a wheelchair will help her perform ADLs and decrease risk of falling.  Also her family will be able to push her easier in the wheelchair when they go out to public places.      Relevant Orders   DME Wheelchair manual     Genitourinary   Recurrent UTI - Primary   Referral placed to urogynecologist due to repeated UTIs multiple ER visits and hospitalization for sepsis due to UTI.  Her most recent urine culture 10 days ago showed no growth, although she is still experiencing symptoms of UTI. I have recommended she add D-mannose 500 mg daily as a preventative measure.  She agrees to add this      Relevant Medications   D-Mannose 500 MG CAPS   Other Relevant Orders   Ambulatory referral to Urogynecology     Other   Personal history of fall   Order for wheelchair placed patient reports increase in her neuropathy making it difficult for her to walk for short distances.  She states she will be able to propel herself in her home, and having a wheelchair will help her perform ADLs and decrease risk of falling.  Also her family will be able to push her easier in the wheelchair when they go out to public places.      Relevant Orders   DME Wheelchair manual   Ambulatory referral to Neurology   Dizziness   Order  for wheelchair placed patient reports increase in her neuropathy making it difficult for her to walk for short distances.  She states she will be able to propel herself in her home, and having a wheelchair will help her perform ADLs and decrease risk of falling.  Also her family will be able to push her easier in the wheelchair when they go out to public places.      Relevant Medications   meclizine  (ANTIVERT ) 25  MG tablet   Other Relevant Orders   DME Wheelchair manual   Ambulatory referral to Neurology    No follow-ups on file.    Alison Irvine, FNP

## 2023-12-08 ENCOUNTER — Ambulatory Visit: Payer: Medicaid Other | Admitting: Nurse Practitioner

## 2023-12-08 DIAGNOSIS — R42 Dizziness and giddiness: Secondary | ICD-10-CM | POA: Insufficient documentation

## 2023-12-08 DIAGNOSIS — Z9181 History of falling: Secondary | ICD-10-CM | POA: Insufficient documentation

## 2023-12-08 NOTE — Assessment & Plan Note (Signed)
 Order for wheelchair placed patient reports increase in her neuropathy making it difficult for her to walk for short distances.  She states she will be able to propel herself in her home, and having a wheelchair will help her perform ADLs and decrease risk of falling.  Also her family will be able to push her easier in the wheelchair when they go out to public places.

## 2023-12-08 NOTE — Progress Notes (Deleted)
 Endocrinology Consult Note       12/08/2023, 7:45 AM   Subjective:    Patient ID: Amy Livingston, female    DOB: [birth year].  Kevin Brownell is being seen in consultation for management of currently uncontrolled symptomatic diabetes requested by  Zarwolo, Gloria, FNP.   Past Medical History:  Diagnosis Date   Anxiety    Atrial fibrillation (HCC)    Diabetes mellitus without complication (HCC)    Dysrhythmia    GERD (gastroesophageal reflux disease)    Hypertension    PONV (postoperative nausea and vomiting)    Shortness of breath dyspnea     Past Surgical History:  Procedure Laterality Date   ABDOMINAL HYSTERECTOMY     ANTERIOR CERVICAL DECOMP/DISCECTOMY FUSION N/A 01/13/2016   Procedure: C5-6 ANTERIOR CERVICAL DECOMPRESSION/DISKECTOMY/FUSION;  Surgeon: Elna Haggis, MD;  Location: [facility];  Service: Neurosurgery;  Laterality: N/A;  C5-6 ANTERIOR CERVICAL DECOMPRESSION/DISKECTOMY/FUSION   BACK SURGERY     x 5   BIOPSY  07/25/2023   Procedure: BIOPSY;  Surgeon: Vinetta Greening, DO;  [facility];  Service: Endoscopy;;   CHOLECYSTECTOMY     COLONOSCOPY WITH PROPOFOL  N/A 07/25/2023   Procedure: COLONOSCOPY WITH PROPOFOL ;  Surgeon: Vinetta Greening, DO;  [facility];  Service: Endoscopy;  Laterality: N/A;  10:45 am, asa 3   ESOPHAGEAL BRUSHING  07/25/2023   Procedure: ESOPHAGEAL BRUSHING;  Surgeon: Vinetta Greening, DO;  [facility];  Service: Endoscopy;;   ESOPHAGOGASTRODUODENOSCOPY (EGD) WITH PROPOFOL  N/A 07/25/2023   Procedure: ESOPHAGOGASTRODUODENOSCOPY (EGD) WITH PROPOFOL ;  Surgeon: Vinetta Greening, DO;  [facility];  Service: Endoscopy;  Laterality: N/A;   FOOT SURGERY     KNEE ARTHROSCOPY     left   KNEE SURGERY Right    POLYPECTOMY  07/25/2023   Procedure: POLYPECTOMY INTESTINAL;  Surgeon: Vinetta Greening, DO;  [facility];   Service: Endoscopy;;   TEE WITHOUT CARDIOVERSION N/A 10/13/2023   Procedure: ECHOCARDIOGRAM, TRANSESOPHAGEAL;  Surgeon: Lasalle Pointer, MD;  Location: [facility];  Service: Cardiovascular;  Laterality: N/A;   TUBAL LIGATION      Social History   Socioeconomic History   Marital status: Divorced    Spouse name: Not on file   Number of children: Not on file   Years of education: Not on file   Highest education level: Some college, no degree  Occupational History   Not on file  Tobacco Use   Smoking status: Never   Smokeless tobacco: Never  Substance and Sexual Activity   Alcohol use: No   Drug use: No   Sexual activity: Not on file  Other Topics Concern   Not on file  Social History Narrative   Not on file   Social Drivers of Health   Financial Resource Strain: Low Risk  (10/12/2023)   Overall Financial Resource Strain (CARDIA)    Difficulty of Paying Living Expenses: Not very hard  Food Insecurity: No Food Insecurity (11/05/2023)   Hunger Vital Sign    Worried About Running Out of Food in the Last Year: Never true    Ran Out of Food in the Last Year: Never  true  Recent Concern: Food Insecurity - Food Insecurity Present (10/18/2023)   Hunger Vital Sign    Worried About Running Out of Food in the Last Year: Sometimes true    Ran Out of Food in the Last Year: Never true  Transportation Needs: No Transportation Needs (11/07/2023)   PRAPARE - Administrator, Civil Service (Medical): No    Lack of Transportation (Non-Medical): No  Physical Activity: Inactive (10/12/2023)   Exercise Vital Sign    Days of Exercise per Week: 0 days    Minutes of Exercise per Session: 0 min  Stress: Stress Concern Present (10/12/2023)   [employer] of Occupational Health - Occupational Stress Questionnaire    Feeling of Stress : Rather much  Social Connections: Socially Isolated (11/05/2023)   Social Connection and Isolation Panel [NHANES]    Frequency of Communication with Friends  and Family: More than three times a week    Frequency of Social Gatherings with Friends and Family: Once a week    Attends Religious Services: Never    Database administrator or Organizations: No    Attends Banker Meetings: Never    Marital Status: Divorced    No family history on file.  Outpatient Encounter Medications as of 12/08/2023  Medication Sig   atorvastatin  (LIPITOR) 80 MG tablet Take 1 tablet (80 mg total) by mouth at bedtime.   busPIRone  (BUSPAR ) 10 MG tablet Take 1 tablet (10 mg total) by mouth 2 (two) times daily.   Cholecalciferol (VITAMIN D -3) 25 MCG (1000 UT) CAPS Take 1,000 Units by mouth 2 (two) times daily at 10 am and 4 pm.   cyclobenzaprine  (FLEXERIL ) 10 MG tablet Take 10 mg by mouth every 12 (twelve) hours.   D-Mannose 500 MG CAPS Take 1 capsule by mouth daily.   ELIQUIS  5 MG TABS tablet Take 5 mg by mouth 2 (two) times daily.   famotidine  (PEPCID ) 20 MG tablet Take 1 tablet (20 mg total) by mouth daily.   fluconazole  (DIFLUCAN ) 150 MG tablet Take 1 tablet (150 mg total) by mouth daily.   gabapentin  (NEURONTIN ) 300 MG capsule TAKE 1 CAPSULE(300 MG) BY MOUTH THREE TIMES DAILY   glucose blood (ACCU-CHEK GUIDE TEST) test strip USE TO TEST BLOOD SUGAR EVERY MORNING, AT NOON, AND EVERY NIGHT AT BEDTIME.   insulin  isophane & regular human KwikPen (NOVOLIN  70/30 KWIKPEN) (70-30) 100 UNIT/ML KwikPen Inject 30 Units into the skin 2 (two) times daily with a meal.   meclizine  (ANTIVERT ) 25 MG tablet Take 1 tablet (25 mg total) by mouth daily as needed for dizziness.   metoprolol  tartrate (LOPRESSOR ) 50 MG tablet Take 0.5 tablets (25 mg total) by mouth 2 (two) times daily.   mirabegron  ER (MYRBETRIQ ) 25 MG TB24 tablet Take 1 tablet (25 mg total) by mouth at bedtime.   omeprazole  (PRILOSEC) 40 MG capsule TAKE 1 CAPSULE(40 MG) BY MOUTH DAILY   ondansetron  (ZOFRAN ) 4 MG tablet Take 1 tablet (4 mg total) by mouth every 6 (six) hours as needed for nausea.    oxyCODONE -acetaminophen  (PERCOCET) 10-325 MG tablet Take 1 tablet by mouth every 8 (eight) hours as needed for pain.   sertraline  (ZOLOFT ) 50 MG tablet TAKE 1 TABLET(50 MG) BY MOUTH DAILY   sucralfate  (CARAFATE ) 1 g tablet Take 1 tablet (1 g total) by mouth 4 (four) times daily -  with meals and at bedtime.   tirzepatide (MOUNJARO) 10 MG/0.5ML Pen Inject 10 mg into the skin once a week.  traZODone  (DESYREL ) 100 MG tablet Take 1 tablet (100 mg total) by mouth at bedtime.   No facility-administered encounter medications on file as of 12/08/2023.    ALLERGIES: Allergies  Allergen Reactions   Elemental Sulfur Itching and Rash    Yeast infections   Metformin  And Related Diarrhea    GI distress    VACCINATION STATUS: Immunization History  Administered Date(s) Administered   Fluad Trivalent(High Dose 65+) 06/01/2023   Fluzone Influenza virus vaccine,trivalent (IIV3), split virus 09/04/2010   Influenza Split 05/15/2015, 05/13/2016   PNEUMOCOCCAL CONJUGATE-20 06/01/2023   Pneumococcal Polysaccharide-23 09/04/2010   Tdap 05/20/2014, 05/10/2016    Diabetes She presents for her initial diabetic visit. She has type 2 diabetes mellitus. Her disease course has been worsening. Hypoglycemia symptoms include nervousness/anxiousness, sweats and tremors. There are no hypoglycemic complications. Diabetic complications include heart disease (Afib) and peripheral neuropathy. Risk factors for coronary artery disease include diabetes mellitus, dyslipidemia, family history, hypertension and sedentary lifestyle. Current diabetic treatment includes insulin  injections (and Mounjaro). She is compliant with treatment most of the time. Her weight is fluctuating minimally. She is following a generally unhealthy diet. When asked about meal planning, she reported none. She has not had a previous visit with a dietitian. She rarely participates in exercise. An ACE inhibitor/angiotensin II receptor blocker is being taken.  She does not see a podiatrist.Eye exam is current.     Review of systems  Constitutional: + Minimally fluctuating body weight, current There is no height or weight on file to calculate BMI., no fatigue, no subjective hyperthermia, no subjective hypothermia Eyes: no blurry vision, no xerophthalmia ENT: no sore throat, no nodules palpated in throat, no dysphagia/odynophagia, no hoarseness Cardiovascular: no chest pain, no shortness of breath, no palpitations, no leg swelling Respiratory: no cough, no shortness of breath Gastrointestinal: no nausea/vomiting/diarrhea Musculoskeletal: no muscle/joint aches Skin: no rashes, no hyperemia Neurological: no tremors, no numbness, no tingling, + dizziness- has referral to ENT pending Psychiatric: no depression, no anxiety  Objective:     There were no vitals taken for this visit.  Wt Readings from Last 3 Encounters:  12/07/23 182 lb 0.6 oz (82.6 kg)  11/18/23 202 lb 1.9 oz (91.7 kg)  11/10/23 204 lb (92.5 kg)     BP Readings from Last 3 Encounters:  12/07/23 113/76  11/28/23 108/61  11/18/23 113/73     Physical Exam- Limited  Constitutional:  There is no height or weight on file to calculate BMI. , not in acute distress, normal state of mind Eyes:  EOMI, no exophthalmos Neck: Supple Cardiovascular: RRR, no murmurs, rubs, or gallops, no edema Respiratory: Adequate breathing efforts, no crackles, rales, rhonchi, or wheezing Musculoskeletal: no gross deformities, strength intact in all four extremities, no gross restriction of joint movements Skin:  no rashes, no hyperemia Neurological: no tremor with outstretched hands   Diabetic Foot Exam - Simple   No data filed      CMP ( most recent) CMP     Component Value Date/Time   NA 138 11/06/2023 0457   NA 138 06/01/2023 1612   K 3.4 (L) 11/06/2023 0457   CL 104 11/06/2023 0457   CO2 26 11/06/2023 0457   GLUCOSE 213 (H) 11/06/2023 0457   BUN 15 11/06/2023 0457   BUN 14  06/01/2023 1612   CREATININE 0.67 11/06/2023 0457   CALCIUM  8.7 (L) 11/06/2023 0457   PROT 6.7 11/05/2023 1311   PROT 6.0 06/01/2023 1612   ALBUMIN 3.2 (L) 11/05/2023 1311  ALBUMIN 4.0 06/01/2023 1612   AST 22 11/05/2023 1311   ALT 15 11/05/2023 1311   ALKPHOS 106 11/05/2023 1311   BILITOT 0.9 11/05/2023 1311   BILITOT 0.5 06/01/2023 1612   EGFR 98.0 10/18/2023 1117   EGFR 86 06/01/2023 1612   GFRNONAA >60 11/06/2023 0457     Diabetic Labs (most recent): Lab Results  Component Value Date   HGBA1C 12.4 (H) 10/09/2023   HGBA1C >15.5 (H) 06/01/2023   HGBA1C 10.1 (H) 01/06/2016     Lipid Panel ( most recent) Lipid Panel     Component Value Date/Time   CHOL 130 06/01/2023 1612   TRIG 138 06/01/2023 1612   HDL 49 06/01/2023 1612   CHOLHDL 2.7 06/01/2023 1612   LDLCALC 57 06/01/2023 1612   LABVLDL 24 06/01/2023 1612      Lab Results  Component Value Date   TSH 0.634 06/01/2023   FREET4 1.37 06/01/2023           Assessment & Plan:   1) Type 2 diabetes mellitus with hyperglycemia, with long-term current use of insulin  (HCC) (Primary)    - Dagny Deeg has currently uncontrolled symptomatic type 2 DM since *** years of age, with most recent A1c of 12.4 %.   -Recent labs reviewed.  - I had a long discussion with her about the progressive nature of diabetes and the pathology behind its complications. -her diabetes is complicated by neuropathy and she remains at a high risk for more acute and chronic complications which include CAD, CVA, CKD, retinopathy, and neuropathy. These are all discussed in detail with her.  The following Lifestyle Medicine recommendations according to American College of Lifestyle Medicine [facility]) were discussed and offered to patient and she agrees to start the journey:  A. Whole Foods, Plant-based plate comprising of fruits and vegetables, plant-based proteins, whole-grain carbohydrates was discussed in detail with the patient.   A list  for source of those nutrients were also provided to the patient.  Patient will use only water or unsweetened tea for hydration. B.  The need to stay away from risky substances including alcohol, smoking; obtaining 7 to 9 hours of restorative sleep, at least 150 minutes of moderate intensity exercise weekly, the importance of healthy social connections,  and stress reduction techniques were discussed. C.  A full color page of  Calorie density of various food groups per pound showing examples of each food groups was provided to the patient.  - I have counseled her on diet and weight management by adopting a carbohydrate restricted/protein rich diet. Patient is encouraged to switch to unprocessed or minimally processed complex starch and increased protein intake (animal or plant source), fruits, and vegetables. -  she is advised to stick to a routine mealtimes to eat 3 meals a day and avoid unnecessary snacks (to snack only to correct hypoglycemia).   - she acknowledges that there is a room for improvement in her food and drink choices. - Suggestion is made for her to avoid simple carbohydrates from her diet including Cakes, Sweet Desserts, Ice Cream, Soda (diet and regular), Sweet Tea, Candies, Chips, Cookies, Store Bought Juices, Alcohol in Excess of 1-2 drinks a day, Artificial Sweeteners, Coffee Creamer, and "Sugar-free" Products. This will help patient to have more stable blood glucose profile and potentially avoid unintended weight gain.  - I have approached her with the following individualized plan to manage her diabetes and patient agrees:    -she is encouraged to start/continue monitoring glucose 4 times  daily, before meals and before bed, to log their readings on the clinic sheets provided, and bring them to review at follow up appointment in 3 months.  - she is warned not to take insulin  without proper monitoring per orders. - Adjustment parameters are given to her for hypo and hyperglycemia  in writing. - she is encouraged to call clinic for blood glucose levels less than 70 or above 300 mg /dl. - she is advised to continue ***, therapeutically suitable for patient . - her *** will be discontinued, risk outweighs benefit for this patient.  - Specific targets for  A1c; LDL, HDL, and Triglycerides were discussed with the patient.  2) Blood Pressure /Hypertension:  her blood pressure is controlled to target.   she is advised to continue her current medications as prescribed by her PCP.  3) Lipids/Hyperlipidemia:    Review of her recent lipid panel from 06/01/23 showed controlled LDL at 57 .  she is advised to continue Lipitor 80 mg daily at bedtime.  Side effects and precautions discussed with her.  4)  Weight/Diet:  her There is no height or weight on file to calculate BMI.  -   she is NOT a candidate for weight loss.  Exercise, and detailed carbohydrates information provided  -  detailed on discharge instructions.  5) Chronic Care/Health Maintenance: -she is on ACEI/ARB and Statin medications and is encouraged to initiate and continue to follow up with Ophthalmology, Dentist, Podiatrist at least yearly or according to recommendations, and advised to stay away from smoking. I have recommended yearly flu vaccine and pneumonia vaccine at least every 5 years; moderate intensity exercise for up to 150 minutes weekly; and sleep for at least 7 hours a day.  - she is advised to maintain close follow up with Zarwolo, Gloria, FNP for primary care needs, as well as her other providers for optimal and coordinated care.   - Time spent in this patient care: 60 min, which was spent in counseling her about her diabetes and the rest reviewing her blood glucose logs, discussing her hypoglycemia and hyperglycemia episodes, reviewing her current and previous labs/studies (including abstraction from other facilities) and medications doses and developing a long term treatment plan based on the latest  standards of care/guidelines; and documenting her care.    Please refer to Patient Instructions for Blood Glucose Monitoring and Insulin /Medications Dosing Guide" in media tab for additional information. Please also refer to "Patient Self Inventory" in the Media tab for reviewed elements of pertinent patient history.  Windsor Hatcher participated in the discussions, expressed understanding, and voiced agreement with the above plans.  All questions were answered to her satisfaction. she is encouraged to contact clinic should she have any questions or concerns prior to her return visit.     Follow up plan: - No follow-ups on file.    Hulon Magic, [facility] [address] [place], [place] [address] Phone: [phone] Fax: [phone]  12/08/2023, 7:45 AM

## 2023-12-08 NOTE — Assessment & Plan Note (Signed)
 Referral placed to urogynecologist due to repeated UTIs multiple ER visits and hospitalization for sepsis due to UTI.  Her most recent urine culture 10 days ago showed no growth, although she is still experiencing symptoms of UTI. I have recommended she add D-mannose 500 mg daily as a preventative measure.  She agrees to add this

## 2023-12-22 ENCOUNTER — Ambulatory Visit: Payer: Self-pay | Admitting: *Deleted

## 2023-12-22 NOTE — Telephone Encounter (Signed)
  Chief Complaint: requesting order for hospital bed for patient home. Currently living with son due to chronic dizziness and frequent falls.  Symptoms: dizziness same esp with standing and turning right side. Worsening room spinning standing . No falls x 3 weeks. Reports BP and blood sugars WNL.  Frequency: since Feb. Pertinent Negatives: Patient denies chest pain no difficulty breathing no weakness on either side of body . No other sx that are new  Disposition: [] ED /[] Urgent Care (no appt availability in office) / [] Appointment(In office/virtual)/ []  [facility]/ [] Home Care/ [] Refused Recommended Disposition /[] [facility]/ [x]  Follow-up with PCP Additional Notes:   Patient requesting order for hospital bed for her home. Patient currently lives with son at his home due to chronic dizziness and she would like to move back to her home with daughter. Has 15 steps to get to her bedroom at her home. If hospital bed can be used patient can stay at her home with bathroom access down stairs. Please advise.  Patient awaiting appt with ENT and cardiology but not scheduled until next month. Please advise.       Copied from CRM [id number]. Topic: Clinical - Red Word Triage >> Dec 05, 2023 11:43 AM Crispin Dolphin wrote: Red Word that prompted transfer to Nurse Triage: dizzy and falling a lot - need medication and referral >> Dec 22, 2023  2:14 PM Lizabeth Riggs wrote: She has been falling and dizzy about 10 times since February, She is calling in to get a hospital to with getting up and down.   Reason for Disposition  [1] MODERATE dizziness (e.g., vertigo; feels very unsteady, interferes with normal activities) AND [2] has been evaluated by doctor (or NP/PA) for this  Answer Assessment - Initial Assessment Questions 1. DESCRIPTION: "Describe your dizziness."     Room spinning and not better 2. VERTIGO: "Do you feel like either you or the room is spinning or tilting?"      Room spinning  turn to right real dizzy less turning to left  3. LIGHTHEADED: "Do you feel lightheaded?" (e.g., somewhat faint, woozy, weak upon standing)     na 4. SEVERITY: "How bad is it?"  "Can you walk?"   - MILD: Feels slightly dizzy and unsteady, but is walking normally.   - MODERATE: Feels unsteady when walking, but not falling; interferes with normal activities (e.g., school, work).   - SEVERE: Unable to walk without falling, or requires assistance to walk without falling.     Moderate  5. ONSET:  "When did the dizziness begin?"     Since Feb  6. AGGRAVATING FACTORS: "Does anything make it worse?" (e.g., standing, change in head position)     Standing , turning on right side  7. CAUSE: "What do you think is causing the dizziness?"     Not sure already has appt scheduled for cardiologist, and ENT 8. RECURRENT SYMPTOM: "Have you had dizziness before?" If Yes, ask: "When was the last time?" "What happened that time?"     Yes  9. OTHER SYMPTOMS: "Do you have any other symptoms?" (e.g., headache, weakness, numbness, vomiting, earache)     Dizziness not better. Chronic. Now new sx  10. PREGNANCY: "Is there any chance you are pregnant?" "When was your last menstrual period?"       na  Protocols used: Dizziness - Vertigo-A-AH

## 2023-12-29 ENCOUNTER — Telehealth: Payer: Self-pay | Admitting: Family Medicine

## 2023-12-29 NOTE — Telephone Encounter (Signed)
 Please advise    Copied from CRM [id number]. Topic: General - Other >> Dec 29, 2023  2:04 PM Emylou G wrote: Reason for CRM: patient called... checking status of hospital bed order?

## 2023-12-29 NOTE — Telephone Encounter (Signed)
 Patient asking for a hospital bed. Ok to order through [facility]?

## 2023-12-30 NOTE — Telephone Encounter (Signed)
 Hospital bed order placed on [facility]

## 2024-01-03 NOTE — Telephone Encounter (Signed)
 Medicare is needing you to addend her last ov note and write the narrative on why she needs a hospital bed and then I have to fax it back to [employer] before they can process the order for the bed. Let me know once complete and I will fax notes to them.

## 2024-01-06 ENCOUNTER — Telehealth: Payer: Self-pay

## 2024-01-06 NOTE — Telephone Encounter (Signed)
 See other encounter.

## 2024-01-06 NOTE — Telephone Encounter (Signed)
 Pt aware that it is in process and shouldn't be much longer per [place]

## 2024-01-06 NOTE — Telephone Encounter (Signed)
 Patient called to get an update on her hospital bed order. Please advise.

## 2024-01-06 NOTE — Telephone Encounter (Signed)
 Copied from CRM [id number]. Topic: Clinical - Order For Equipment >> Jan 06, 2024  1:28 PM Turkey B wrote: Reason for CRM: pt called in hasn't received hosp bed yet., please cb with status

## 2024-01-11 ENCOUNTER — Other Ambulatory Visit: Payer: Self-pay | Admitting: Family Medicine

## 2024-01-11 DIAGNOSIS — E1140 Type 2 diabetes mellitus with diabetic neuropathy, unspecified: Secondary | ICD-10-CM

## 2024-01-11 DIAGNOSIS — E1165 Type 2 diabetes mellitus with hyperglycemia: Secondary | ICD-10-CM

## 2024-01-16 ENCOUNTER — Telehealth: Payer: Self-pay

## 2024-01-16 NOTE — Telephone Encounter (Signed)
 Copied from CRM [id number]. Topic: Clinical - Prescription Issue >> Jan 13, 2024  4:13 PM DeAngela L wrote: Reason for CRM:  Patient call about her medication refill the pharmacysent over the fill and it was denied cause the dosage was not appropriate was the response the pharmacy So patient has not picked up her medication  tirzepatide (MOUNJARO) 10 MG/0.5ML Pen, the patient says the dosage was upped at her last visit 7.5 she believes  PT number [phone] (M)

## 2024-01-16 NOTE — Telephone Encounter (Signed)
 The dose was increased, as she has been on 7.5 mg for a month.

## 2024-01-17 ENCOUNTER — Ambulatory Visit: Attending: Internal Medicine | Admitting: Internal Medicine

## 2024-01-17 ENCOUNTER — Encounter: Payer: Self-pay | Admitting: Internal Medicine

## 2024-01-17 VITALS — BP 92/58 | HR 71 | Ht 66.0 in | Wt 201.4 lb

## 2024-01-17 DIAGNOSIS — G909 Disorder of the autonomic nervous system, unspecified: Secondary | ICD-10-CM | POA: Insufficient documentation

## 2024-01-17 MED ORDER — METOPROLOL TARTRATE 50 MG PO TABS: 25.0000 mg | ORAL_TABLET | Freq: Every day | ORAL | Status: DC | PRN

## 2024-01-17 NOTE — Progress Notes (Signed)
 Cardiology Office Note  Date: 01/17/2024   ID: Chelcea Zahn, DOB Jun 26, 1956, MRN [id number]  PCP:  Zarwolo, Gloria, FNP  Cardiologist:  None Electrophysiologist:  None   History of Present Illness: Amy Livingston is a 68 y.o. female known to have paroxysmal A-fib, insulin -dependent type 2 diabetes mellitus, is here for follow-up visit.  Patient is to follow-up with [facility] cardiology, diagnosed with atrial fibrillation in 2021 based on event monitor reports.  I reviewed all her EKGs on the EMR that showed normal sinus rhythm.  She was recently hospitalized, had UTI and C. difficile.  Blood cultures grew staph lugdunensis.  TTE and TEE were unremarkable, no evidence of infective endocarditis.  She had multiple falls from dizziness and syncope prior to the hospital admission in March 2025.  After discharge from the hospital, she did not have any more syncopal events.  Last episode of syncope or fall was almost 2 months ago.  Her blood was a soft, 90 mmHg SBP during the clinic.  Currently on metoprolol  tartrate 25 mg twice daily.  On Eliquis  5 mg twice daily, does not have any bleeding complications.  No angina or DOE.  Has vertigo, has appointment with ENT soon.  No leg swelling.    Past Medical History:  Diagnosis Date   Anxiety    Atrial fibrillation (HCC)    Diabetes mellitus without complication (HCC)    Dysrhythmia    GERD (gastroesophageal reflux disease)    Hypertension    PONV (postoperative nausea and vomiting)    Shortness of breath dyspnea     Past Surgical History:  Procedure Laterality Date   ABDOMINAL HYSTERECTOMY     ANTERIOR CERVICAL DECOMP/DISCECTOMY FUSION N/A 01/13/2016   Procedure: C5-6 ANTERIOR CERVICAL DECOMPRESSION/DISKECTOMY/FUSION;  Surgeon: Elna Haggis, MD;  Location: [facility];  Service: Neurosurgery;  Laterality: N/A;  C5-6 ANTERIOR CERVICAL DECOMPRESSION/DISKECTOMY/FUSION   BACK SURGERY     x 5   BIOPSY  07/25/2023   Procedure: BIOPSY;  Surgeon:  Vinetta Greening, DO;  Location: [facility];  Service: Endoscopy;;   CHOLECYSTECTOMY     COLONOSCOPY WITH PROPOFOL  N/A 07/25/2023   Procedure: COLONOSCOPY WITH PROPOFOL ;  Surgeon: Vinetta Greening, DO;  Location: [facility];  Service: Endoscopy;  Laterality: N/A;  10:45 am, asa 3   ESOPHAGEAL BRUSHING  07/25/2023   Procedure: ESOPHAGEAL BRUSHING;  Surgeon: Vinetta Greening, DO;  Location: [facility];  Service: Endoscopy;;   ESOPHAGOGASTRODUODENOSCOPY (EGD) WITH PROPOFOL  N/A 07/25/2023   Procedure: ESOPHAGOGASTRODUODENOSCOPY (EGD) WITH PROPOFOL ;  Surgeon: Vinetta Greening, DO;  Location: [facility];  Service: Endoscopy;  Laterality: N/A;   FOOT SURGERY     KNEE ARTHROSCOPY     left   KNEE SURGERY Right    POLYPECTOMY  07/25/2023   Procedure: POLYPECTOMY INTESTINAL;  Surgeon: Vinetta Greening, DO;  Location: [facility];  Service: Endoscopy;;   TEE WITHOUT CARDIOVERSION N/A 10/13/2023   Procedure: ECHOCARDIOGRAM, TRANSESOPHAGEAL;  Surgeon: Lasalle Pointer, MD;  Location: [facility];  Service: Cardiovascular;  Laterality: N/A;   TUBAL LIGATION      Current Outpatient Medications  Medication Sig Dispense Refill   atorvastatin  (LIPITOR) 80 MG tablet Take 1 tablet (80 mg total) by mouth at bedtime.     busPIRone  (BUSPAR ) 10 MG tablet Take 1 tablet (10 mg total) by mouth 2 (two) times daily. 90 tablet 1   Cholecalciferol (VITAMIN D -3) 25 MCG (1000 UT) CAPS Take 1,000 Units by mouth 2 (two)  times daily at 10 am and 4 pm.     cyclobenzaprine  (FLEXERIL ) 10 MG tablet Take 10 mg by mouth every 12 (twelve) hours.     D-Mannose 500 MG CAPS Take 1 capsule by mouth daily. 30 capsule 5   ELIQUIS  5 MG TABS tablet Take 5 mg by mouth 2 (two) times daily.     famotidine  (PEPCID ) 20 MG tablet Take 1 tablet (20 mg total) by mouth daily. 30 tablet 0   fluconazole  (DIFLUCAN ) 150 MG tablet Take 1 tablet (150 mg total) by mouth daily. 1 tablet 0   gabapentin  (NEURONTIN ) 300 MG capsule TAKE 1  CAPSULE(300 MG) BY MOUTH THREE TIMES DAILY 120 capsule 1   glucose blood (ACCU-CHEK GUIDE TEST) test strip USE TO TEST BLOOD SUGAR EVERY MORNING, AT NOON, AND EVERY NIGHT AT BEDTIME. 100 strip 0   insulin  isophane & regular human KwikPen (NOVOLIN  70/30 KWIKPEN) (70-30) 100 UNIT/ML KwikPen Inject 30 Units into the skin 2 (two) times daily with a meal. 3 mL 1   meclizine  (ANTIVERT ) 25 MG tablet Take 1 tablet (25 mg total) by mouth daily as needed for dizziness. 30 tablet 5   metoprolol  tartrate (LOPRESSOR ) 50 MG tablet Take 0.5 tablets (25 mg total) by mouth 2 (two) times daily.     mirabegron  ER (MYRBETRIQ ) 25 MG TB24 tablet Take 1 tablet (25 mg total) by mouth at bedtime.     omeprazole  (PRILOSEC) 40 MG capsule TAKE 1 CAPSULE(40 MG) BY MOUTH DAILY 90 capsule 0   ondansetron  (ZOFRAN ) 4 MG tablet Take 1 tablet (4 mg total) by mouth every 6 (six) hours as needed for nausea. 20 tablet 0   oxyCODONE -acetaminophen  (PERCOCET) 10-325 MG tablet Take 1 tablet by mouth every 8 (eight) hours as needed for pain.     sertraline  (ZOLOFT ) 50 MG tablet TAKE 1 TABLET(50 MG) BY MOUTH DAILY 90 tablet 0   sucralfate  (CARAFATE ) 1 g tablet Take 1 tablet (1 g total) by mouth 4 (four) times daily -  with meals and at bedtime. 45 tablet 0   tirzepatide (MOUNJARO) 10 MG/0.5ML Pen Inject 10 mg into the skin once a week. 2 mL 0   traZODone  (DESYREL ) 100 MG tablet Take 1 tablet (100 mg total) by mouth at bedtime. 60 tablet 1   No current facility-administered medications for this visit.   Allergies:  Elemental sulfur and Metformin  and related   Social History: The patient  reports that she has never smoked. She has never used smokeless tobacco. She reports that she does not drink alcohol and does not use drugs.   Family History: The patient's family history is not on file.   ROS:  Please see the history of present illness. Otherwise, complete review of systems is positive for none  All other systems are reviewed and  negative.   Physical Exam: VS:  There were no vitals taken for this visit., BMI There is no height or weight on file to calculate BMI.  Wt Readings from Last 3 Encounters:  12/07/23 182 lb 0.6 oz (82.6 kg)  11/18/23 202 lb 1.9 oz (91.7 kg)  11/10/23 204 lb (92.5 kg)    General: Patient appears comfortable at rest. HEENT: Conjunctiva and lids normal, oropharynx clear with moist mucosa. Neck: Supple, no elevated JVP or carotid bruits, no thyromegaly. Lungs: Clear to auscultation, nonlabored breathing at rest. Cardiac: Regular rate and rhythm, no S3 or significant systolic murmur, no pericardial rub. Abdomen: Soft, nontender, no hepatomegaly, bowel sounds present, no guarding  or rebound. Extremities: No pitting edema, distal pulses 2+. Skin: Warm and dry. Musculoskeletal: No kyphosis. Neuropsychiatric: Alert and oriented x3, affect grossly appropriate.  Recent Labwork: 06/01/2023: TSH 0.634 10/08/2023: B Natriuretic Peptide 39.0 11/05/2023: ALT 15; AST 22 11/06/2023: BUN 15; Creatinine, Ser 0.67; Hemoglobin 10.1; Platelets 187; Potassium 3.4; Sodium 138     Component Value Date/Time   CHOL 130 06/01/2023 1612   TRIG 138 06/01/2023 1612   HDL 49 06/01/2023 1612   CHOLHDL 2.7 06/01/2023 1612   LDLCALC 57 06/01/2023 1612    Assessment and Plan:   Paroxysmal A-fib: Currently on metoprolol  tartrate 25 mg twice daily, will switch to as needed due to soft blood pressures and frequent dizziness in the past.  Last episode of dizziness/syncope was around 1 to 2 months ago.  Continue Eliquis  5 mg twice daily, no bleeding complications.  Tested for OSA, does not have OSA.  Dizziness/syncope likely secondary to autonomic dysfunction from poorly controlled DM 2 versus dehydration: Patient had multiple dizziness/syncopal events prior to March hospitalization where she was diagnosed with C. Difficile.in addition, now DM2 is better controlled according to the patient.  Soft blood pressures, 90 mmHg  SBP.  Wear compression socks, prescription and encourage p.o. hydration.  Vertigo: She has vertigo mainly with neck movements has appointment with ENT coming up soon.      Medication Adjustments/Labs and Tests Ordered: Current medicines are reviewed at length with the patient today.  Concerns regarding medicines are outlined above.    Disposition:  Follow up 1 year  Signed Adalae Baysinger Priya Sedalia Greeson, MD, 01/17/2024 12:48 PM    [facility] at [facility] [address] [place], [place], [place] [address]

## 2024-01-17 NOTE — Patient Instructions (Addendum)
 Medication Instructions:  Your physician has recommended you make the following change in your medication:  Start taking Metoprolol  Tartrate 25 mg as needed for palpitations  Continue taking all other medications as prescribed   Labwork: None  Testing/Procedures: None  Follow-Up: Your physician recommends that you schedule a follow-up appointment in: 1 year. You will receive a reminder call in about 8 months reminding you to schedule your appointment. If you don't receive this call, please contact our office.   Any Other Special Instructions Will Be Listed Below (If Applicable). Thank you for choosing [facility]!     If you need a refill on your cardiac medications before your next appointment, please call your pharmacy.

## 2024-01-18 ENCOUNTER — Other Ambulatory Visit: Payer: Self-pay | Admitting: Family Medicine

## 2024-01-18 DIAGNOSIS — E1165 Type 2 diabetes mellitus with hyperglycemia: Secondary | ICD-10-CM

## 2024-01-18 NOTE — Telephone Encounter (Signed)
 Copied from CRM [id number]. Topic: Clinical - Medication Refill >> Jan 18, 2024  1:53 PM Everette C wrote: Medication: tirzepatide (MOUNJARO) 10 MG/0.5ML Pen [[id number]]  insulin  isophane & regular human KwikPen (NOVOLIN  70/30 KWIKPEN) (70-30) 100 UNIT/ML KwikPen [[id number]]   Has the patient contacted their pharmacy? Yes (Agent: If no, request that the patient contact the pharmacy for the refill. If patient does not wish to contact the pharmacy document the reason why and proceed with request.) (Agent: If yes, when and what did the pharmacy advise?)  This is the patient's preferred pharmacy:  [facility] [phone] - [place], [place] - [address] AT [facility] [address] [place] [address] Phone: [phone] Fax: [phone]  Is this the correct pharmacy for this prescription? Yes If no, delete pharmacy and type the correct one.   Has the prescription been filled recently? Yes  Is the patient out of the medication? Yes  Has the patient been seen for an appointment in the last year OR does the patient have an upcoming appointment? Yes  Can we respond through MyChart? No  Agent: Please be advised that Rx refills may take up to 3 business days. We ask that you follow-up with your pharmacy.

## 2024-01-23 ENCOUNTER — Other Ambulatory Visit: Payer: Self-pay | Admitting: Family Medicine

## 2024-01-23 ENCOUNTER — Other Ambulatory Visit: Payer: Self-pay

## 2024-01-23 ENCOUNTER — Telehealth: Payer: Self-pay

## 2024-01-23 DIAGNOSIS — E1165 Type 2 diabetes mellitus with hyperglycemia: Secondary | ICD-10-CM

## 2024-01-23 MED ORDER — OMEPRAZOLE 40 MG PO CPDR
40.0000 mg | DELAYED_RELEASE_CAPSULE | Freq: Every day | ORAL | 0 refills | Status: DC
Start: 2024-01-23 — End: 2024-04-02

## 2024-01-23 MED ORDER — NOVOLIN 70/30 FLEXPEN (70-30) 100 UNIT/ML ~~LOC~~ SUPN: PEN_INJECTOR | SUBCUTANEOUS | 1 refills | Status: DC

## 2024-01-23 MED ORDER — MOUNJARO 12.5 MG/0.5ML ~~LOC~~ SOAJ: 12.5000 mg | SUBCUTANEOUS | 0 refills | Status: DC

## 2024-01-23 NOTE — Telephone Encounter (Signed)
 Copied from CRM [id number]. Topic: Clinical - Medication Refill >> Jan 18, 2024  1:53 PM Everette C wrote: Medication: tirzepatide  (MOUNJARO ) 10 MG/0.5ML Pen [[id number]]  insulin  isophane & regular human KwikPen (NOVOLIN  70/30 KWIKPEN) (70-30) 100 UNIT/ML KwikPen [[id number]]   Has the patient contacted their pharmacy? Yes (Agent: If no, request that the patient contact the pharmacy for the refill. If patient does not wish to contact the pharmacy document the reason why and proceed with request.) (Agent: If yes, when and what did the pharmacy advise?)  This is the patient's preferred pharmacy:  [facility] [phone] - [place], [place] - [address] AT [facility] OF [address] [place] [address] Phone: [phone] Fax: [phone]  Is this the correct pharmacy for this prescription? Yes If no, delete pharmacy and type the correct one.   Has the prescription been filled recently? Yes  Is the patient out of the medication? Yes  Has the patient been seen for an appointment in the last year OR does the patient have an upcoming appointment? Yes  Can we respond through MyChart? No  Agent: Please be advised that Rx refills may take up to 3 business days. We ask that you follow-up with your pharmacy. >> Jan 20, 2024  3:40 PM Bridgette Campus T wrote: Calling for status of refills- has been out for a week.

## 2024-01-23 NOTE — Telephone Encounter (Signed)
 Sent to pcp

## 2024-01-23 NOTE — Telephone Encounter (Signed)
 Pt is also requesting refill of Famotidine  20mg  and Sucralfate  1g that she received from [facility] on 11/28/2023 that she needs for her Gastroesophageal Reflux

## 2024-01-23 NOTE — Telephone Encounter (Signed)
 Copied from CRM [id number]. Topic: Clinical - Medication Refill >> Jan 18, 2024  1:53 PM Everette C wrote: Medication: tirzepatide (MOUNJARO) 10 MG/0.5ML Pen [[id number]]  insulin  isophane & regular human KwikPen (NOVOLIN  70/30 KWIKPEN) (70-30) 100 UNIT/ML KwikPen [[id number]]   Has the patient contacted their pharmacy? Yes (Agent: If no, request that the patient contact the pharmacy for the refill. If patient does not wish to contact the pharmacy document the reason why and proceed with request.) (Agent: If yes, when and what did the pharmacy advise?)  This is the patient's preferred pharmacy:  [facility] [phone] - [place], [place] - [address] AT [facility] [address] [place] [address] Phone: [phone] Fax: [phone]  Is this the correct pharmacy for this prescription? Yes If no, delete pharmacy and type the correct one.   Has the prescription been filled recently? Yes  Is the patient out of the medication? Yes  Has the patient been seen for an appointment in the last year OR does the patient have an upcoming appointment? Yes  Can we respond through MyChart? No  Agent: Please be advised that Rx refills may take up to 3 business days. We ask that you follow-up with your pharmacy.

## 2024-01-24 ENCOUNTER — Other Ambulatory Visit (HOSPITAL_COMMUNITY): Payer: Self-pay

## 2024-01-24 ENCOUNTER — Telehealth: Payer: Self-pay | Admitting: Pharmacy Technician

## 2024-01-24 NOTE — Telephone Encounter (Signed)
 Pharmacy Patient Advocate Encounter   Received notification from CoverMyMeds that prior authorization for Mounjaro  12.5MG /0.5ML auto-injectors is required/requested.   Insurance verification completed.   The patient is insured through [employer] .   Per test claim: PA required; PA submitted to above mentioned insurance via CoverMyMeds Key/confirmation #/EOC [id number] Status is pending

## 2024-01-25 ENCOUNTER — Other Ambulatory Visit (HOSPITAL_COMMUNITY): Payer: Self-pay

## 2024-01-25 NOTE — Telephone Encounter (Signed)
 Pharmacy Patient Advocate Encounter  Received notification from [facility] Medicare that Prior Authorization for Mounjaro  12.5MG /0.5ML auto-injectors has been APPROVED from 01/24/2024 to 08/08/2098. Unable to obtain price due to refill too soon rejection, last fill date 01/25/2024 next available fill date07/03/2024.   PA #/Case ID/Reference #: [id number]

## 2024-01-27 ENCOUNTER — Other Ambulatory Visit: Payer: Self-pay

## 2024-01-27 ENCOUNTER — Other Ambulatory Visit: Payer: Self-pay | Admitting: Family Medicine

## 2024-01-27 MED ORDER — SUCRALFATE 1 G PO TABS: 1.0000 g | ORAL_TABLET | Freq: Three times a day (TID) | ORAL | 0 refills | Status: AC

## 2024-01-27 MED ORDER — FAMOTIDINE 20 MG PO TABS: 20.0000 mg | ORAL_TABLET | Freq: Every day | ORAL | 0 refills | Status: AC

## 2024-01-27 NOTE — Telephone Encounter (Signed)
 Refilled

## 2024-01-30 ENCOUNTER — Ambulatory Visit: Payer: Self-pay

## 2024-01-30 NOTE — Telephone Encounter (Signed)
 FYI Only or Action Required?: Action required by provider: request for appointment.  Patient was last seen in primary care on 12/07/2023 by Bevely Doffing, FNP. Called Nurse Triage reporting Diarrhea. Symptoms began a week ago. Interventions attempted: Rest, hydration, or home remedies. Symptoms are: nausea, vomiting (3 episodes, resolved), decreased appetite, severe diarrhea every 2 hours (loose, mucus stools), fever 101, heartburn/acid reflux after Mounjaro  shot on Friday stable.  Triage Disposition: See HCP Within 4 Hours (Or PCP Triage)- no available appointments with [facility] and patient refused urgent care today due to no transportation  Patient/caregiver understands and will follow disposition?: No, refuses disposition- requesting appt with PCP and states if not able to see PCP she will go to Urgent Care on Wednesday                           Pt states she had C diff two months ago, having symptoms again. Patient states when she is using the bathroom, a sticky slimey comes out and it has an odor to it.    Pt also took monjauro on friday and had heartburn to the point where she threw up.  Requesting appt for Wednesday afternoon around 2pm.   Reason for Disposition  [1] SEVERE diarrhea (e.g., 7 or more times / day more than normal) AND [2] age > 60 years  Answer Assessment - Initial Assessment Questions Called [facility] and spoke with staff regarding if they prefer patient to be seen virtually or in office due to concern for C diff. Staff states they would prefer patient to see her GI doctor. Patient states she only saw GI for her colonoscopy and does not follow up regularly with him. Patient denies blood in stool.  1. DIARRHEA SEVERITY: How bad is the diarrhea? How many more stools have you had in the past 24 hours than normal?    - NO DIARRHEA (SCALE 0)   - MILD (SCALE 1-3): Few loose or mushy BMs; increase of 1-3 stools over normal daily number of stools; mild increase  in ostomy output.   -  MODERATE (SCALE 4-7): Increase of 4-6 stools daily over normal; moderate increase in ostomy output.   -  SEVERE (SCALE 8-10; OR WORST POSSIBLE): Increase of 7 or more stools daily over normal; moderate increase in ostomy output; incontinence.     She states she is going every 2 hours.  2. ONSET: When did the diarrhea begin?      Over a week.  3. BM CONSISTENCY: How loose or watery is the diarrhea?      Loose, liquid and mucous.  4. VOMITING: Are you also vomiting? If Yes, ask: How many times in the past 24 hours?      Yes, she states after her Mounjaro  shot on Friday. Intermittent vomiting (3 times total) for 2 days.  5. ABDOMEN PAIN: Are you having any abdomen pain? If Yes, ask: What does it feel like? (e.g., crampy, dull, intermittent, constant)      No.  6. ABDOMEN PAIN SEVERITY: If present, ask: How bad is the pain?  (e.g., Scale 1-10; mild, moderate, or severe)   - MILD (1-3): doesn't interfere with normal activities, abdomen soft and not tender to touch    - MODERATE (4-7): interferes with normal activities or awakens from sleep, abdomen tender to touch    - SEVERE (8-10): excruciating pain, doubled over, unable to do any normal activities       0/10.  7.  ORAL INTAKE: If vomiting, Have you been able to drink liquids? How much liquids have you had in the past 24 hours?     Ginger ale, Powerade Zero. She states she has not eaten much in the past 3 days.  8. HYDRATION: Any signs of dehydration? (e.g., dry mouth [not just dry lips], too weak to stand, dizziness, new weight loss) When did you last urinate?     Patient states she stays dizzy but it is chronic and related to her ear and is seeing the ENT for that. She denies any additional symptoms of dehydration and last urinated about an hour ago.  9. EXPOSURE: Have you traveled to a foreign country recently? Have you been exposed to anyone with diarrhea? Could you have eaten any  food that was spoiled?     No.  10. ANTIBIOTIC USE: Are you taking antibiotics now or have you taken antibiotics in the past 2 months?       No.  11. OTHER SYMPTOMS: Do you have any other symptoms? (e.g., fever, blood in stool)       Fever 101 (2 days ago), heartburn/acid reflux on Friday after Mounjaro  injection.  12. PREGNANCY: Is there any chance you are pregnant? When was your last menstrual period?       N/A.  Protocols used: [facility]

## 2024-02-01 ENCOUNTER — Ambulatory Visit
Admission: RE | Admit: 2024-02-01 | Discharge: 2024-02-01 | Disposition: A | Discharge status: Home / Self Care | Payer: Self-pay | Source: Ambulatory Visit | Attending: Nurse Practitioner | Admitting: Nurse Practitioner

## 2024-02-01 VITALS — BP 142/83 | HR 94 | Temp 97.5°F | Resp 16

## 2024-02-01 DIAGNOSIS — Z87440 Personal history of urinary (tract) infections: Secondary | ICD-10-CM | POA: Diagnosis present

## 2024-02-01 DIAGNOSIS — R399 Unspecified symptoms and signs involving the genitourinary system: Secondary | ICD-10-CM | POA: Diagnosis present

## 2024-02-01 DIAGNOSIS — R197 Diarrhea, unspecified: Secondary | ICD-10-CM | POA: Insufficient documentation

## 2024-02-01 LAB — POCT URINALYSIS DIP (MANUAL ENTRY)
Glucose, UA: NEGATIVE mg/dL
Nitrite, UA: NEGATIVE
Spec Grav, UA: >=1.03 — AB (ref 1.010–1.025)
Urobilinogen, UA: 0.2 U/dL
pH, UA: 6 (ref 5.0–8.0)

## 2024-02-01 MED ORDER — CEPHALEXIN 500 MG PO CAPS: 500.0000 mg | ORAL_CAPSULE | Freq: Four times a day (QID) | ORAL | 0 refills | Status: DC

## 2024-02-01 NOTE — Discharge Instructions (Signed)
 A urine culture is pending.  You will be contacted when the results of the culture are received.  You will also have access to your results via MyChart. Take medication as prescribed. Make sure you are drinking at least 8-10 8 ounce glasses of water daily while symptoms persist. You may take over-the-counter Tylenol  as needed for pain or discomfort. Develop a toileting schedule that will allow you to urinate at least every 2 hours. Avoid caffeine such as tea, soda, or coffee while symptoms persist. Go to the emergency department immediately if you experience worsening urinary symptoms with fever, chills, pain around your kidneys, or other concerns. Follow-up with urology as scheduled. As discussed, please follow-up with your primary care physician within the next 7 to 10 days for your ongoing gastrointestinal symptoms and for further evaluation. Follow-up as needed.

## 2024-02-01 NOTE — ED Provider Notes (Addendum)
 [facility]    CSN: [id number] Arrival date & time: 02/01/24  1249      History   Chief Complaint Chief Complaint  Patient presents with   Diarrhea    Entered by patient    HPI Amy Livingston is a 68 y.o. female.   The history is provided by the patient.   Patient presents with a 1 week history of pain and pressure with urination, frequency, and malodorous urine.  She also complains of clear, slimy diarrhea for the past 3 days.  She reports prior history of C. difficile, she is requesting C. difficile testing, but states she is unable to give a sample at this time.  Patient states that she has not had any episodes of diarrhea today.  With regard to her urinary symptoms, she reports history of recurrent UTIs, last UTI was in March, she was treated at this clinic subsequently thereafter, but states she never started the medication because her culture indicated she did not have a UTI.  Patient also endorses fever, states fever was high as 101 over the past week.  She states she is scheduled to see urology in August.  Denies chills, headache, abdominal pain, nausea, vomiting, hematuria, decreased urine stream, flank pain, or low back pain.  Past Medical History:  Diagnosis Date   Anxiety    Atrial fibrillation (HCC)    Diabetes mellitus without complication (HCC)    Dysrhythmia    GERD (gastroesophageal reflux disease)    Hypertension    PONV (postoperative nausea and vomiting)    Shortness of breath dyspnea     Patient Active Problem List   Diagnosis Date Noted   Autonomic dysfunction 01/17/2024   Personal history of fall 12/08/2023   Dizziness 12/08/2023   Insomnia 11/20/2023   Serous otitis media 11/19/2023   Recurrent UTI 11/05/2023   Bacteremia due to other bacteria 10/13/2023   Paroxysmal atrial fibrillation (HCC) 10/09/2023   Anxiety and depression 10/09/2023   Syncope and collapse 10/09/2023   BRBPR (bright red blood per rectum) 10/09/2023    Enteritis due to Clostridium difficile 10/09/2023   Diarrhea of infectious origin 10/09/2023   Sepsis due to gram-negative UTI (HCC) 10/08/2023   Hypokalemia 10/08/2023   Hyponatremia 10/08/2023   Uncontrolled type 2 diabetes mellitus with hyperglycemia, with long-term current use of insulin  (HCC) 10/08/2023   Essential hypertension 10/08/2023   Peripheral neuropathy 10/08/2023   Overactive bladder 06/17/2023   Generalized anxiety disorder 06/17/2023   Type 2 diabetes mellitus with diabetic neuropathy, unspecified (HCC) 06/17/2023   Hypertension 06/01/2023   Urinary frequency 06/01/2023   Bowel incontinence 06/01/2023   Generalized weakness 06/01/2023   Encounter for immunization 06/01/2023   Diabetes 1.5, 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   PAF (paroxysmal atrial fibrillation) (HCC) 08/14/2020   Dyslipidemia 07/01/2020   Type 2 diabetes mellitus with hyperglycemia (HCC) 07/01/2020   Body mass index (BMI) 40.0-44.9, adult (HCC) 11/07/2019   Elevated blood-pressure reading, without diagnosis of hypertension 11/07/2019   Spondylolisthesis, lumbar region 07/18/2018   Cervical spondylosis with myelopathy 01/13/2016   Cervical disc disorder with myelopathy 12/04/2015   Neck pain 12/04/2015    Past Surgical History:  Procedure Laterality Date   ABDOMINAL HYSTERECTOMY     ANTERIOR CERVICAL DECOMP/DISCECTOMY FUSION N/A 01/13/2016   Procedure: C5-6 ANTERIOR CERVICAL DECOMPRESSION/DISKECTOMY/FUSION;  Surgeon: Victory Gens, MD;  Location: [facility];  Service: Neurosurgery;  Laterality: N/A;  C5-6 ANTERIOR CERVICAL DECOMPRESSION/DISKECTOMY/FUSION   BACK SURGERY     x 5  BIOPSY  07/25/2023   Procedure: BIOPSY;  Surgeon: Cindie Carlin POUR, DO;  Location: [facility];  Service: Endoscopy;;   CHOLECYSTECTOMY     COLONOSCOPY WITH PROPOFOL  N/A 07/25/2023   Procedure: COLONOSCOPY WITH PROPOFOL ;  Surgeon: Cindie Carlin POUR, DO;  Location: [facility];  Service: Endoscopy;  Laterality:  N/A;  10:45 am, asa 3   ESOPHAGEAL BRUSHING  07/25/2023   Procedure: ESOPHAGEAL BRUSHING;  Surgeon: Cindie Carlin POUR, DO;  [facility]: [facility];  Service: Endoscopy;;   ESOPHAGOGASTRODUODENOSCOPY (EGD) WITH PROPOFOL  N/A 07/25/2023   Procedure: ESOPHAGOGASTRODUODENOSCOPY (EGD) WITH PROPOFOL ;  Surgeon: Cindie Carlin POUR, DO;  [facility];  Service: Endoscopy;  Laterality: N/A;   FOOT SURGERY     KNEE ARTHROSCOPY     left   KNEE SURGERY Right    POLYPECTOMY  07/25/2023   Procedure: POLYPECTOMY INTESTINAL;  Surgeon: Cindie Carlin POUR, DO;  [facility]: [facility];  Service: Endoscopy;;   TEE WITHOUT CARDIOVERSION N/A 10/13/2023   Procedure: ECHOCARDIOGRAM, TRANSESOPHAGEAL;  Surgeon: Stacia Diannah SQUIBB, MD;  Location: [facility];  Service: Cardiovascular;  Laterality: N/A;   TUBAL LIGATION      OB History   No obstetric history on file.      Home Medications    Prior to Admission medications   Medication Sig Start Date End Date Taking? Authorizing Provider  cephALEXin  (KEFLEX ) 500 MG capsule Take 1 capsule (500 mg total) by mouth 4 (four) times daily. 02/01/24  Yes Leath-Warren, Etta PARAS, NP  atorvastatin  (LIPITOR) 80 MG tablet Take 1 tablet (80 mg total) by mouth at bedtime. 11/06/23   Johnson, Clanford L, MD  busPIRone  (BUSPAR ) 10 MG tablet Take 1 tablet (10 mg total) by mouth 2 (two) times daily. 06/16/23   Zarwolo, Gloria, FNP  Cholecalciferol (VITAMIN D -3) 25 MCG (1000 UT) CAPS Take 1,000 Units by mouth 2 (two) times daily at 10 am and 4 pm.    [provider]  cyclobenzaprine  (FLEXERIL ) 10 MG tablet Take 10 mg by mouth every 12 (twelve) hours.    [provider]  D-Mannose 500 MG CAPS Take 1 capsule by mouth daily. 12/07/23   Bevely Doffing, FNP  ELIQUIS  5 MG TABS tablet Take 5 mg by mouth 2 (two) times daily.    [provider]  famotidine  (PEPCID ) 20 MG tablet Take 1 tablet (20 mg total) by mouth daily. 01/27/24   Zarwolo, Gloria, FNP   gabapentin  (NEURONTIN ) 300 MG capsule TAKE 1 CAPSULE(300 MG) BY MOUTH THREE TIMES DAILY 01/12/24   Zarwolo, Gloria, FNP  glucose blood (ACCU-CHEK GUIDE TEST) test strip USE TO TEST BLOOD SUGAR EVERY MORNING, AT NOON, AND EVERY NIGHT AT BEDTIME. 12/06/23   Zarwolo, Gloria, FNP  insulin  isophane & regular human KwikPen (NOVOLIN  70/30 KWIKPEN) (70-30) 100 UNIT/ML KwikPen Inject 30 Units into the skin 2 (two) times daily with a meal. 01/23/24   Zarwolo, Gloria, FNP  meclizine  (ANTIVERT ) 25 MG tablet Take 1 tablet (25 mg total) by mouth daily as needed for dizziness. 12/07/23   Bevely Doffing, FNP  metoprolol  tartrate (LOPRESSOR ) 50 MG tablet Take 0.5 tablets (25 mg total) by mouth daily as needed. 01/17/24   Mallipeddi, Vishnu P, MD  mirabegron  ER (MYRBETRIQ ) 25 MG TB24 tablet Take 1 tablet (25 mg total) by mouth at bedtime. 11/06/23   Johnson, Clanford L, MD  omeprazole  (PRILOSEC) 40 MG capsule Take 1 capsule (40 mg total) by mouth daily. 01/23/24   Zarwolo, Gloria, FNP  ondansetron  (ZOFRAN ) 4  MG tablet Take 1 tablet (4 mg total) by mouth every 6 (six) hours as needed for nausea. 10/14/23   Pearlean Manus, MD  oxyCODONE -acetaminophen  (PERCOCET) 10-325 MG tablet Take 1 tablet by mouth every 8 (eight) hours as needed for pain.    [provider]  sertraline  (ZOLOFT ) 50 MG tablet TAKE 1 TABLET(50 MG) BY MOUTH DAILY 12/06/23   Zarwolo, Gloria, FNP  sucralfate  (CARAFATE ) 1 g tablet Take 1 tablet (1 g total) by mouth 4 (four) times daily -  with meals and at bedtime. 01/27/24   Zarwolo, Gloria, FNP  tirzepatide  (MOUNJARO ) 12.5 MG/0.5ML Pen Inject 12.5 mg into the skin once a week. 01/23/24   Zarwolo, Gloria, FNP  traZODone  (DESYREL ) 100 MG tablet Take 1 tablet (100 mg total) by mouth at bedtime. 11/18/23   Zarwolo, Gloria, FNP    Family History History reviewed. No pertinent family history.  Social History Social History   Tobacco Use   Smoking status: Never   Smokeless tobacco: Never  Substance Use  Topics   Alcohol use: No   Drug use: No     Allergies   Elemental sulfur and Metformin  and related   Review of Systems Review of Systems Per HPI  Physical Exam Triage Vital Signs ED Triage Vitals [02/01/24 1307]  Encounter Vitals Group     BP (!) 142/83     Girls Systolic BP Percentile      Girls Diastolic BP Percentile      Boys Systolic BP Percentile      Boys Diastolic BP Percentile      Pulse Rate 94     Resp 16     Temp (!) 97.5 F (36.4 C)     Temp Source Oral     SpO2 95 %     Weight      Height      Head Circumference      Peak Flow      Pain Score 0     Pain Loc      Pain Education      Exclude from Growth Chart    No data found.  Updated Vital Signs BP (!) 142/83 (BP Location: Right Arm)   Pulse 94   Temp (!) 97.5 F (36.4 C) (Oral)   Resp 16   SpO2 95%   Visual Acuity Right Eye Distance:   Left Eye Distance:   Bilateral Distance:    Right Eye Near:   Left Eye Near:    Bilateral Near:     Physical Exam Vitals and nursing note reviewed.  Constitutional:      General: She is not in acute distress.    Appearance: Normal appearance.  HENT:     Head: Normocephalic.     Mouth/Throat:     Mouth: Mucous membranes are moist.   Eyes:     Extraocular Movements: Extraocular movements intact.     Pupils: Pupils are equal, round, and reactive to light.    Cardiovascular:     Rate and Rhythm: Normal rate and regular rhythm.     Pulses: Normal pulses.     Heart sounds: Normal heart sounds.  Pulmonary:     Effort: Pulmonary effort is normal. No respiratory distress.     Breath sounds: Normal breath sounds. No stridor. No wheezing, rhonchi or rales.  Abdominal:     Palpations: Abdomen is soft.     Tenderness: There is no abdominal tenderness. There is no right CVA tenderness or left CVA tenderness.  Musculoskeletal:     Cervical back: Normal range of motion.   Skin:    General: Skin is warm and dry.   Neurological:     General: No  focal deficit present.     Mental Status: She is alert and oriented to person, place, and time.   Psychiatric:        Mood and Affect: Mood normal.        Behavior: Behavior normal.      UC Treatments / Results  Labs (all labs ordered are listed, but only abnormal results are displayed) Labs Reviewed  POCT URINALYSIS DIP (MANUAL ENTRY) - Abnormal; Notable for the following components:      Result Value   Clarity, UA cloudy (*)    Bilirubin, UA small (*)    Ketones, POC UA trace (5) (*)    Spec Grav, UA >=1.030 (*)    Blood, UA large (*)    Protein Ur, POC trace (*)    Leukocytes, UA Large (3+) (*)    All other components within normal limits  URINE CULTURE    EKG   Radiology No results found.  Procedures Procedures (including critical care time)  Medications Ordered in UC Medications - No data to display  Initial Impression / Assessment and Plan / UC Course  I have reviewed the triage vital signs and the nursing notes.  Pertinent labs & imaging results that were available during my care of the patient were reviewed by me and considered in my medical decision making (see chart for details).  Urinalysis is suspicious for UTI.  Will treat with Keflex  500 mg.  With regard to her diarrhea symptoms, patient states symptoms have been intermittent over the past several months, she has not had any episodes of diarrhea today.  Low suspicion for C. difficile.  Patient is unable to provide a sample, advised patient is requested that she follow-up with her primary care physician if symptoms continue to persist.  Supportive care recommendations were provided and discussed with the patient to include increasing fluids, rest, over-the-counter analgesics, developing a toileting schedule, and avoiding caffeine.  Patient advised to follow-up with urology as scheduled.  Patient was also given strict ER follow-up precautions.  Patient was in agreement with this plan of care and verbalizes  understanding.  All questions were answered.  Patient stable for discharge.   Final Clinical Impressions(s) / UC Diagnoses   Final diagnoses:  UTI symptoms  History of recurrent UTI (urinary tract infection)     Discharge Instructions      A urine culture is pending.  You will be contacted when the results of the culture are received.  You will also have access to your results via MyChart. Take medication as prescribed. Make sure you are drinking at least 8-10 8 ounce glasses of water daily while symptoms persist. You may take over-the-counter Tylenol  as needed for pain or discomfort. Develop a toileting schedule that will allow you to urinate at least every 2 hours. Avoid caffeine such as tea, soda, or coffee while symptoms persist. Go to the emergency department immediately if you experience worsening urinary symptoms with fever, chills, pain around your kidneys, or other concerns. Follow-up with urology as scheduled. As discussed, please follow-up with your primary care physician within the next 7 to 10 days for your ongoing gastrointestinal symptoms and for further evaluation. Follow-up as needed.     ED Prescriptions     Medication Sig Dispense Auth. Provider   cephALEXin  (KEFLEX ) 500  MG capsule Take 1 capsule (500 mg total) by mouth 4 (four) times daily. 28 capsule Leath-Warren, Etta PARAS, NP      PDMP not reviewed this encounter.   Gilmer Etta PARAS, NP 02/01/24 1344    Leath-Warren, Etta PARAS, NP 02/01/24 1344

## 2024-02-01 NOTE — ED Triage Notes (Signed)
 Pt reports she has some pain and pressure with urination, frequency with urination, fishy smelling odor x 1 week   Clear slimy diarrhea and fever x 3 days

## 2024-02-02 ENCOUNTER — Telehealth: Payer: Self-pay | Admitting: Family Medicine

## 2024-02-02 LAB — URINE CULTURE

## 2024-02-02 NOTE — Telephone Encounter (Signed)
 Left detailed message for patient to call back to schedule Diabetic Eye Exam if interested or if she already sees an eye doctor, we need to know who and where that is.

## 2024-02-03 ENCOUNTER — Ambulatory Visit (HOSPITAL_COMMUNITY): Payer: Self-pay

## 2024-02-03 ENCOUNTER — Other Ambulatory Visit: Payer: Self-pay | Admitting: Family Medicine

## 2024-02-03 DIAGNOSIS — E1165 Type 2 diabetes mellitus with hyperglycemia: Secondary | ICD-10-CM

## 2024-02-03 LAB — URINE CULTURE: Culture: >=100000 — AB

## 2024-02-15 ENCOUNTER — Encounter (INDEPENDENT_AMBULATORY_CARE_PROVIDER_SITE_OTHER): Payer: Self-pay | Admitting: Otolaryngology

## 2024-02-15 ENCOUNTER — Ambulatory Visit (INDEPENDENT_AMBULATORY_CARE_PROVIDER_SITE_OTHER): Admitting: Otolaryngology

## 2024-02-15 VITALS — BP 116/74 | HR 109 | Ht 66.0 in | Wt 180.0 lb

## 2024-02-15 DIAGNOSIS — H608X3 Other otitis externa, bilateral: Secondary | ICD-10-CM

## 2024-02-15 DIAGNOSIS — R42 Dizziness and giddiness: Secondary | ICD-10-CM | POA: Diagnosis not present

## 2024-02-15 MED ORDER — MOMETASONE FUROATE 0.1 % EX CREA: TOPICAL_CREAM | CUTANEOUS | 3 refills | Status: AC

## 2024-02-16 ENCOUNTER — Other Ambulatory Visit: Payer: Self-pay | Admitting: Family Medicine

## 2024-02-16 DIAGNOSIS — H608X3 Other otitis externa, bilateral: Secondary | ICD-10-CM | POA: Insufficient documentation

## 2024-02-16 DIAGNOSIS — E1165 Type 2 diabetes mellitus with hyperglycemia: Secondary | ICD-10-CM

## 2024-02-16 NOTE — Telephone Encounter (Signed)
 Copied from CRM [id number]. Topic: Clinical - Medication Refill >> Feb 16, 2024  5:43 PM Jakyia R wrote: Medication: insulin  isophane & regular human KwikPen (NOVOLIN  70/30 KWIKPEN) (70-30) 100 UNIT/ML KwikPen ( 3count)   Has the patient contacted their pharmacy? Yes (Agent: If no, request that the patient contact the pharmacy for the refill. If patient does not wish to contact the pharmacy document the reason why and proceed with request.) (Agent: If yes, when and what did the pharmacy advise?)  This is the patient's preferred pharmacy:  [facility] [phone] - [place], [place] - [address] AT [facility] OF [address] & [address] [place] [address] Phone: [phone] Fax: [phone]  Is this the correct pharmacy for this prescription? Yes If no, delete pharmacy and type the correct one.   Has the prescription been filled recently? No, not for 3 count   Is the patient out of the medication? Yes  Has the patient been seen for an appointment in the last year OR does the patient have an upcoming appointment? Yes  Can we respond through MyChart? Yes  Agent: Please be advised that Rx refills may take up to 3 business days. We ask that you follow-up with your pharmacy.

## 2024-02-16 NOTE — Progress Notes (Signed)
 CC: Recurrent dizziness, itchy ears  HPI:  Amy Livingston is a 68 y.o. female who presents today complaining of recurrent dizziness and bilateral itchy ears.  According to the patient, she has been having recurrent dizziness for the past several months.  She describes the dizziness as a spinning vertigo the last for seconds to minutes.  It is often triggered with sudden head movement, especially when she turns her head to the right.  In addition, she also complains of itchy sensation in her ears.  She denies any otalgia, otorrhea, or recent change in her hearing.  However, she has bilateral constant tinnitus.  She has no previous ENT surgery.  Past Medical History:  Diagnosis Date   Anxiety    Atrial fibrillation (HCC)    Diabetes mellitus without complication (HCC)    Dysrhythmia    GERD (gastroesophageal reflux disease)    Hypertension    PONV (postoperative nausea and vomiting)    Shortness of breath dyspnea     Past Surgical History:  Procedure Laterality Date   ABDOMINAL HYSTERECTOMY     ANTERIOR CERVICAL DECOMP/DISCECTOMY FUSION N/A 01/13/2016   Procedure: C5-6 ANTERIOR CERVICAL DECOMPRESSION/DISKECTOMY/FUSION;  Surgeon: Victory Gens, MD;  Location: [facility];  Service: Neurosurgery;  Laterality: N/A;  C5-6 ANTERIOR CERVICAL DECOMPRESSION/DISKECTOMY/FUSION   BACK SURGERY     x 5   BIOPSY  07/25/2023   Procedure: BIOPSY;  Surgeon: Cindie Carlin POUR, DO;  [facility];  Service: Endoscopy;;   CHOLECYSTECTOMY     COLONOSCOPY WITH PROPOFOL  N/A 07/25/2023   Procedure: COLONOSCOPY WITH PROPOFOL ;  Surgeon: Cindie Carlin POUR, DO;  [facility];  Service: Endoscopy;  Laterality: N/A;  10:45 am, asa 3   ESOPHAGEAL BRUSHING  07/25/2023   Procedure: ESOPHAGEAL BRUSHING;  Surgeon: Cindie Carlin POUR, DO;  [facility];  Service: Endoscopy;;   ESOPHAGOGASTRODUODENOSCOPY (EGD) WITH PROPOFOL  N/A 07/25/2023   Procedure: ESOPHAGOGASTRODUODENOSCOPY (EGD) WITH  PROPOFOL ;  Surgeon: Cindie Carlin POUR, DO;  [facility];  Service: Endoscopy;  Laterality: N/A;   FOOT SURGERY     KNEE ARTHROSCOPY     left   KNEE SURGERY Right    POLYPECTOMY  07/25/2023   Procedure: POLYPECTOMY INTESTINAL;  Surgeon: Cindie Carlin POUR, DO;  [facility];  Service: Endoscopy;;   TEE WITHOUT CARDIOVERSION N/A 10/13/2023   Procedure: ECHOCARDIOGRAM, TRANSESOPHAGEAL;  Surgeon: Stacia Diannah SQUIBB, MD;  Location: [facility];  Service: Cardiovascular;  Laterality: N/A;   TUBAL LIGATION      No family history on file.  Social History:  reports that she has never smoked. She has never used smokeless tobacco. She reports that she does not drink alcohol and does not use drugs.  Allergies:  Allergies  Allergen Reactions   Elemental Sulfur Itching and Rash    Yeast infections   Metformin  And Related Diarrhea    GI distress    Prior to Admission medications   Medication Sig Start Date End Date Taking? Authorizing Provider  atorvastatin  (LIPITOR) 80 MG tablet Take 1 tablet (80 mg total) by mouth at bedtime. 11/06/23  Yes Johnson, Clanford L, MD  busPIRone  (BUSPAR ) 10 MG tablet Take 1 tablet (10 mg total) by mouth 2 (two) times daily. 06/16/23  Yes Zarwolo, Gloria, FNP  cephALEXin  (KEFLEX ) 500 MG capsule Take 1 capsule (500 mg total) by mouth 4 (four) times daily. 02/01/24  Yes Leath-Warren, Etta PARAS, NP  Cholecalciferol (VITAMIN D -3) 25 MCG (1000 UT) CAPS Take 1,000 Units by  mouth 2 (two) times daily at 10 am and 4 pm.   Yes [provider]  cyclobenzaprine  (FLEXERIL ) 10 MG tablet Take 10 mg by mouth every 12 (twelve) hours.   Yes [provider]  D-Mannose 500 MG CAPS Take 1 capsule by mouth daily. 12/07/23  Yes Bevely Doffing, FNP  ELIQUIS  5 MG TABS tablet Take 5 mg by mouth 2 (two) times daily.   Yes [provider]  famotidine  (PEPCID ) 20 MG tablet Take 1 tablet (20 mg total) by mouth daily. 01/27/24  Yes Zarwolo, Gloria, FNP   gabapentin  (NEURONTIN ) 300 MG capsule TAKE 1 CAPSULE(300 MG) BY MOUTH THREE TIMES DAILY 01/12/24  Yes Zarwolo, Gloria, FNP  glucose blood (ACCU-CHEK GUIDE TEST) test strip USE TO TEST BLOOD SUGAR EVERY MORNING, AT NOON, AND EVERY NIGHT AT BEDTIME. 12/06/23  Yes Zarwolo, Gloria, FNP  insulin  isophane & regular human KwikPen (NOVOLIN  70/30 KWIKPEN) (70-30) 100 UNIT/ML KwikPen Inject 30 Units into the skin 2 (two) times daily with a meal. 01/23/24  Yes Zarwolo, Gloria, FNP  meclizine  (ANTIVERT ) 25 MG tablet Take 1 tablet (25 mg total) by mouth daily as needed for dizziness. 12/07/23  Yes Bevely Doffing, FNP  metoprolol  tartrate (LOPRESSOR ) 50 MG tablet Take 0.5 tablets (25 mg total) by mouth daily as needed. 01/17/24  Yes Mallipeddi, Vishnu P, MD  mirabegron  ER (MYRBETRIQ ) 25 MG TB24 tablet Take 1 tablet (25 mg total) by mouth at bedtime. 11/06/23  Yes Johnson, Clanford L, MD  mometasone  (ELOCON ) 0.1 % cream Apply topically daily as needed for itch 02/15/24  Yes Karis Clunes, MD  omeprazole  (PRILOSEC) 40 MG capsule Take 1 capsule (40 mg total) by mouth daily. 01/23/24  Yes Zarwolo, Gloria, FNP  ondansetron  (ZOFRAN ) 4 MG tablet Take 1 tablet (4 mg total) by mouth every 6 (six) hours as needed for nausea. 10/14/23  Yes Emokpae, Courage, MD  oxyCODONE -acetaminophen  (PERCOCET) 10-325 MG tablet Take 1 tablet by mouth every 8 (eight) hours as needed for pain.   Yes [provider]  sertraline  (ZOLOFT ) 50 MG tablet TAKE 1 TABLET(50 MG) BY MOUTH DAILY 12/06/23  Yes Zarwolo, Gloria, FNP  sucralfate  (CARAFATE ) 1 g tablet Take 1 tablet (1 g total) by mouth 4 (four) times daily -  with meals and at bedtime. 01/27/24  Yes Zarwolo, Gloria, FNP  tirzepatide  (MOUNJARO ) 12.5 MG/0.5ML Pen Inject 12.5 mg into the skin once a week. 01/23/24  Yes Zarwolo, Gloria, FNP  traZODone  (DESYREL ) 100 MG tablet Take 1 tablet (100 mg total) by mouth at bedtime. 11/18/23  Yes Zarwolo, Gloria, FNP    Blood pressure 116/74, pulse (!) 109, height  5' 6 (1.676 m), weight 180 lb (81.6 kg), SpO2 96%. Exam: General: Communicates without difficulty, well nourished, no acute distress. Head: Normocephalic, no evidence injury, no tenderness, facial buttresses intact without stepoff. Face/sinus: No tenderness to palpation and percussion. Facial movement is normal and symmetric. Eyes: PERRL, EOMI. No scleral icterus, conjunctivae clear. Neuro: CN II exam reveals vision grossly intact.  No nystagmus at any point of gaze. Ears: Auricles well formed without lesions.  Ear canals are intact with bilateral eczematous changes.  No erythema or edema is appreciated.  The TMs are intact without fluid. Nose: External evaluation reveals normal support and skin without lesions.  Dorsum is intact.  Anterior rhinoscopy reveals congested mucosa over anterior aspect of inferior turbinates and intact septum.  No purulence noted. Oral:  Oral cavity and oropharynx are intact, symmetric, without erythema or edema.  Mucosa is  moist without lesions. Neck: Full range of motion without pain.  There is no significant lymphadenopathy.  No masses palpable.  Thyroid  bed within normal limits to palpation.  Parotid glands and submandibular glands equal bilaterally without mass.  Trachea is midline. Neuro:  CN 2-12 grossly intact. Vestibular: No nystagmus at any point of gaze. Dix Hallpike negative. Vestibular: There is no nystagmus with pneumatic pressure on either tympanic membrane or Valsalva. The cerebellar examination is unremarkable.   Assessment: 1.  Bilateral chronic eczematous otitis externa. 2.  Recurrent dizziness of unknown etiology. The possible differential diagnoses include transient BPPV, vestibular migraine, Meniere's disease, peripheral vestibular dysfunction, or other central/systemic causes.  3.  Her tympanic membrane's and middle ear spaces are noted to be normal.  No middle ear effusion is noted.  Her Dix-Hallpike maneuver is negative.  Plan: 1.  The physical exam  findings are reviewed with the patient. 2.  Elocon  cream to treat the chronic eczematous otitis externa. 3.  It is explained to the patient that her dizziness is likely secondary to transient BPPV.  Other possible differential diagnoses are also extensively discussed.  Questions were invited and answered. 4.  The patient will return for reevaluation in 2 months, sooner if needed.  Genell Thede W Emmory Solivan 02/16/2024, 8:51 AM

## 2024-02-17 MED ORDER — NOVOLIN 70/30 FLEXPEN (70-30) 100 UNIT/ML ~~LOC~~ SUPN: PEN_INJECTOR | SUBCUTANEOUS | 1 refills | Status: DC

## 2024-02-27 ENCOUNTER — Other Ambulatory Visit: Payer: Self-pay | Admitting: Family Medicine

## 2024-02-27 DIAGNOSIS — F411 Generalized anxiety disorder: Secondary | ICD-10-CM

## 2024-03-05 ENCOUNTER — Ambulatory Visit: Payer: Self-pay

## 2024-03-06 ENCOUNTER — Ambulatory Visit
Admission: RE | Admit: 2024-03-06 | Discharge: 2024-03-06 | Disposition: A | Discharge status: Home / Self Care | Payer: Self-pay | Source: Ambulatory Visit | Attending: Nurse Practitioner | Admitting: Nurse Practitioner

## 2024-03-06 VITALS — BP 100/65 | HR 118 | Temp 98.2°F | Resp 20

## 2024-03-06 DIAGNOSIS — Z87440 Personal history of urinary (tract) infections: Secondary | ICD-10-CM | POA: Diagnosis not present

## 2024-03-06 DIAGNOSIS — Z113 Encounter for screening for infections with a predominantly sexual mode of transmission: Secondary | ICD-10-CM | POA: Diagnosis present

## 2024-03-06 DIAGNOSIS — N3000 Acute cystitis without hematuria: Secondary | ICD-10-CM | POA: Insufficient documentation

## 2024-03-06 DIAGNOSIS — R5383 Other fatigue: Secondary | ICD-10-CM | POA: Diagnosis present

## 2024-03-06 DIAGNOSIS — B961 Klebsiella pneumoniae [K. pneumoniae] as the cause of diseases classified elsewhere: Secondary | ICD-10-CM | POA: Diagnosis not present

## 2024-03-06 LAB — POCT URINE DIPSTICK
Bilirubin, UA: NEGATIVE
Glucose, UA: 500 mg/dL — AB
Ketones, POC UA: NEGATIVE mg/dL
Nitrite, UA: POSITIVE — AB
Protein Ur, POC: 100 mg/dL — AB
Spec Grav, UA: <=1.005 — AB (ref 1.010–1.025)
Urobilinogen, UA: 1 U/dL
pH, UA: 5.5 (ref 5.0–8.0)

## 2024-03-06 MED ORDER — CEFUROXIME AXETIL 500 MG PO TABS: 500.0000 mg | ORAL_TABLET | Freq: Two times a day (BID) | ORAL | 0 refills | Status: DC

## 2024-03-06 NOTE — Discharge Instructions (Signed)
 The urinalysis shows that you do have a urinary tract infection.  A urine culture and cytology swab have been ordered.  You will be contacted when the results of the pending test are received.  You will also access to the results via MyChart. Take medication as prescribed. Make sure you are drinking at least 8-10 eight ounce glasses of water daily while symptoms persist. You may take over-the-counter Tylenol  as needed for pain or discomfort. Develop a toileting schedule that will allow you to urinate at least every 2 hours. Avoid caffeine such as tea, soda, or coffee while symptoms persist. Go to the emergency department immediately if you experience worsening urinary symptoms with fever, chills, pain around your kidneys, or other concerns. Follow-up with urology as scheduled. Follow-up as needed.

## 2024-03-06 NOTE — ED Triage Notes (Signed)
 Pt reports urinary incontinence, burning with urination, foul odor to urine smells fishy, body aches and fever x 1 week.

## 2024-03-06 NOTE — ED Provider Notes (Signed)
 [facility]    CSN: [id number] Arrival date & time: 03/06/24  1258      History   Chief Complaint Chief Complaint  Patient presents with   Urinary Frequency    My urine smells like dead fish - Entered by patient    HPI Amy Livingston is a 68 y.o. female.   The history is provided by the patient.   Patient presents for complaints of of dysuria, urinary incontinence, malodorous urine, and generalized fatigue for the past week.  Reports that her last UTI was in June, she was treated with Keflex .  States that she is continuing to wait to see the urologist.  States appointment is scheduled for next month.  Denies hematuria, flank pain, low back pain, nausea, vomiting, decreased urine stream, vaginal discharge, or vaginal itching.  States that she does have some pain on the inside of her vaginal area.  States she drinks approximately 332 ounce containers of water daily.  Past Medical History:  Diagnosis Date   Anxiety    Atrial fibrillation (HCC)    Diabetes mellitus without complication (HCC)    Dysrhythmia    GERD (gastroesophageal reflux disease)    Hypertension    PONV (postoperative nausea and vomiting)    Shortness of breath dyspnea     Patient Active Problem List   Diagnosis Date Noted   Chronic eczematous otitis externa of both ears 02/16/2024   Autonomic dysfunction 01/17/2024   Personal history of fall 12/08/2023   Dizziness 12/08/2023   Insomnia 11/20/2023   Serous otitis media 11/19/2023   Recurrent UTI 11/05/2023   Bacteremia due to other bacteria 10/13/2023   Paroxysmal atrial fibrillation (HCC) 10/09/2023   Anxiety and depression 10/09/2023   Syncope and collapse 10/09/2023   BRBPR (bright red blood per rectum) 10/09/2023   Enteritis due to Clostridium difficile 10/09/2023   Diarrhea of infectious origin 10/09/2023   Sepsis due to gram-negative UTI (HCC) 10/08/2023   Hypokalemia 10/08/2023   Hyponatremia 10/08/2023   Uncontrolled type 2  diabetes mellitus with hyperglycemia, with long-term current use of insulin  (HCC) 10/08/2023   Essential hypertension 10/08/2023   Peripheral neuropathy 10/08/2023   Overactive bladder 06/17/2023   Generalized anxiety disorder 06/17/2023   Type 2 diabetes mellitus with diabetic neuropathy, unspecified (HCC) 06/17/2023   Hypertension 06/01/2023   Urinary frequency 06/01/2023   Bowel incontinence 06/01/2023   Generalized weakness 06/01/2023   Encounter for immunization 06/01/2023   Diabetes 1.5, 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   PAF (paroxysmal atrial fibrillation) (HCC) 08/14/2020   Dyslipidemia 07/01/2020   Type 2 diabetes mellitus with hyperglycemia (HCC) 07/01/2020   Body mass index (BMI) 40.0-44.9, adult (HCC) 11/07/2019   Elevated blood-pressure reading, without diagnosis of hypertension 11/07/2019   Spondylolisthesis, lumbar region 07/18/2018   Cervical spondylosis with myelopathy 01/13/2016   Cervical disc disorder with myelopathy 12/04/2015   Neck pain 12/04/2015    Past Surgical History:  Procedure Laterality Date   ABDOMINAL HYSTERECTOMY     ANTERIOR CERVICAL DECOMP/DISCECTOMY FUSION N/A 01/13/2016   Procedure: C5-6 ANTERIOR CERVICAL DECOMPRESSION/DISKECTOMY/FUSION;  Surgeon: Victory Gens, MD;  Location: [facility];  Service: Neurosurgery;  Laterality: N/A;  C5-6 ANTERIOR CERVICAL DECOMPRESSION/DISKECTOMY/FUSION   BACK SURGERY     x 5   BIOPSY  07/25/2023   Procedure: BIOPSY;  Surgeon: Cindie Carlin POUR, DO;  Location: [facility];  Service: Endoscopy;;   CHOLECYSTECTOMY     COLONOSCOPY WITH PROPOFOL  N/A 07/25/2023   Procedure: COLONOSCOPY WITH PROPOFOL ;  Surgeon: Cindie Carlin POUR, DO;  Location: [facility];  Service: Endoscopy;  Laterality: N/A;  10:45 am, asa 3   ESOPHAGEAL BRUSHING  07/25/2023   Procedure: ESOPHAGEAL BRUSHING;  Surgeon: Cindie Carlin POUR, DO;  Location: [facility];  Service: Endoscopy;;   ESOPHAGOGASTRODUODENOSCOPY (EGD) WITH PROPOFOL   N/A 07/25/2023   Procedure: ESOPHAGOGASTRODUODENOSCOPY (EGD) WITH PROPOFOL ;  Surgeon: Cindie Carlin POUR, DO;  Location: [facility];  Service: Endoscopy;  Laterality: N/A;   FOOT SURGERY     KNEE ARTHROSCOPY     left   KNEE SURGERY Right    POLYPECTOMY  07/25/2023   Procedure: POLYPECTOMY INTESTINAL;  Surgeon: Cindie Carlin POUR, DO;  Location: [facility];  Service: Endoscopy;;   TEE WITHOUT CARDIOVERSION N/A 10/13/2023   Procedure: ECHOCARDIOGRAM, TRANSESOPHAGEAL;  Surgeon: Stacia Diannah SQUIBB, MD;  Location: [facility];  Service: Cardiovascular;  Laterality: N/A;   TUBAL LIGATION      OB History   No obstetric history on file.      Home Medications    Prior to Admission medications   Medication Sig Start Date End Date Taking? Authorizing Provider  cefUROXime  (CEFTIN ) 500 MG tablet Take 1 tablet (500 mg total) by mouth 2 (two) times daily with a meal for 7 days. 03/06/24 03/13/24 Yes Leath-Warren, Etta PARAS, NP  atorvastatin  (LIPITOR) 80 MG tablet TAKE 1 TABLET(80 MG) BY MOUTH DAILY 02/28/24   Zarwolo, Gloria, FNP  busPIRone  (BUSPAR ) 10 MG tablet Take 1 tablet (10 mg total) by mouth 2 (two) times daily. 06/16/23   Zarwolo, Gloria, FNP  cephALEXin  (KEFLEX ) 500 MG capsule Take 1 capsule (500 mg total) by mouth 4 (four) times daily. 02/01/24   Leath-Warren, Etta PARAS, NP  Cholecalciferol (VITAMIN D -3) 25 MCG (1000 UT) CAPS Take 1,000 Units by mouth 2 (two) times daily at 10 am and 4 pm.    [provider]  cyclobenzaprine  (FLEXERIL ) 10 MG tablet Take 10 mg by mouth every 12 (twelve) hours.    [provider]  D-Mannose 500 MG CAPS Take 1 capsule by mouth daily. 12/07/23   Bevely Doffing, FNP  ELIQUIS  5 MG TABS tablet Take 5 mg by mouth 2 (two) times daily.    [provider]  famotidine  (PEPCID ) 20 MG tablet Take 1 tablet (20 mg total) by mouth daily. 01/27/24   Zarwolo, Gloria, FNP  gabapentin  (NEURONTIN ) 300 MG capsule TAKE 1 CAPSULE(300 MG) BY MOUTH THREE TIMES  DAILY 01/12/24   Zarwolo, Gloria, FNP  glucose blood (ACCU-CHEK GUIDE TEST) test strip USE TO TEST BLOOD SUGAR EVERY MORNING, AT NOON, AND EVERY NIGHT AT BEDTIME. 12/06/23   Zarwolo, Gloria, FNP  insulin  isophane & regular human KwikPen (NOVOLIN  70/30 KWIKPEN) (70-30) 100 UNIT/ML KwikPen Inject 30 Units into the skin 2 (two) times daily with a meal. 02/17/24   Zarwolo, Gloria, FNP  meclizine  (ANTIVERT ) 25 MG tablet Take 1 tablet (25 mg total) by mouth daily as needed for dizziness. 12/07/23   Bevely Doffing, FNP  metoprolol  tartrate (LOPRESSOR ) 50 MG tablet Take 0.5 tablets (25 mg total) by mouth daily as needed. 01/17/24   Mallipeddi, Vishnu P, MD  mirabegron  ER (MYRBETRIQ ) 25 MG TB24 tablet Take 1 tablet (25 mg total) by mouth at bedtime. 11/06/23   Johnson, Clanford L, MD  mometasone  (ELOCON ) 0.1 % cream Apply topically daily as needed for itch 02/15/24   Karis Clunes, MD  omeprazole  (PRILOSEC) 40 MG capsule Take 1 capsule (40 mg total) by mouth daily. 01/23/24   Zarwolo,  Meade, FNP  ondansetron  (ZOFRAN ) 4 MG tablet Take 1 tablet (4 mg total) by mouth every 6 (six) hours as needed for nausea. 10/14/23   Pearlean Manus, MD  oxyCODONE -acetaminophen  (PERCOCET) 10-325 MG tablet Take 1 tablet by mouth every 8 (eight) hours as needed for pain.    [provider]  sertraline  (ZOLOFT ) 50 MG tablet TAKE 1 TABLET(50 MG) BY MOUTH DAILY 02/28/24   Zarwolo, Gloria, FNP  sucralfate  (CARAFATE ) 1 g tablet Take 1 tablet (1 g total) by mouth 4 (four) times daily -  with meals and at bedtime. 01/27/24   Zarwolo, Gloria, FNP  tirzepatide  (MOUNJARO ) 12.5 MG/0.5ML Pen Inject 12.5 mg into the skin once a week. 01/23/24   Zarwolo, Gloria, FNP  traZODone  (DESYREL ) 100 MG tablet Take 1 tablet (100 mg total) by mouth at bedtime. 11/18/23   Zarwolo, Gloria, FNP    Family History History reviewed. No pertinent family history.  Social History Social History   Tobacco Use   Smoking status: Never   Smokeless tobacco: Never   Substance Use Topics   Alcohol use: No   Drug use: No     Allergies   Elemental sulfur and Metformin  and related   Review of Systems Review of Systems Per HPI  Physical Exam Triage Vital Signs ED Triage Vitals  Encounter Vitals Group     BP 03/06/24 1308 100/65     Girls Systolic BP Percentile --      Girls Diastolic BP Percentile --      Boys Systolic BP Percentile --      Boys Diastolic BP Percentile --      Pulse Rate 03/06/24 1308 (!) 118     Resp 03/06/24 1308 20     Temp 03/06/24 1308 98.2 F (36.8 C)     Temp Source 03/06/24 1308 Oral     SpO2 03/06/24 1308 92 %     Weight --      Height --      Head Circumference --      Peak Flow --      Pain Score 03/06/24 1314 0     Pain Loc --      Pain Education --      Exclude from Growth Chart --    No data found.  Updated Vital Signs BP 100/65 (BP Location: Right Arm)   Pulse (!) 118   Temp 98.2 F (36.8 C) (Oral)   Resp 20   SpO2 92%   Visual Acuity Right Eye Distance:   Left Eye Distance:   Bilateral Distance:    Right Eye Near:   Left Eye Near:    Bilateral Near:     Physical Exam Vitals and nursing note reviewed.  Constitutional:      General: She is not in acute distress.    Appearance: Normal appearance.  HENT:     Head: Normocephalic.  Eyes:     Extraocular Movements: Extraocular movements intact.     Conjunctiva/sclera: Conjunctivae normal.     Pupils: Pupils are equal, round, and reactive to light.  Cardiovascular:     Rate and Rhythm: Regular rhythm. Tachycardia present.     Pulses: Normal pulses.     Heart sounds: Normal heart sounds.  Pulmonary:     Effort: Pulmonary effort is normal. No respiratory distress.     Breath sounds: Normal breath sounds. No stridor. No wheezing, rhonchi or rales.  Abdominal:     General: Bowel sounds are normal.  Palpations: Abdomen is soft.     Tenderness: There is no abdominal tenderness. There is left CVA tenderness.  Musculoskeletal:      Cervical back: Normal range of motion.  Skin:    General: Skin is warm and dry.  Neurological:     General: No focal deficit present.     Mental Status: She is alert and oriented to person, place, and time.  Psychiatric:        Mood and Affect: Mood normal.        Behavior: Behavior normal.      UC Treatments / Results  Labs (all labs ordered are listed, but only abnormal results are displayed) Labs Reviewed  POCT URINE DIPSTICK - Abnormal; Notable for the following components:      Result Value   Color, UA orange (*)    Clarity, UA cloudy (*)    Glucose, UA =500 (*)    Spec Grav, UA <=1.005 (*)    Blood, UA large (*)    Protein Ur, POC =100 (*)    Nitrite, UA Positive (*)    Leukocytes, UA Large (3+) (*)    All other components within normal limits  URINE CULTURE  CERVICOVAGINAL ANCILLARY ONLY    EKG   Radiology No results found.  Procedures Procedures (including critical care time)  Medications Ordered in UC Medications - No data to display  Initial Impression / Assessment and Plan / UC Course  I have reviewed the triage vital signs and the nursing notes.  Pertinent labs & imaging results that were available during my care of the patient were reviewed by me and considered in my medical decision making (see chart for details).  Urinalysis is positive for leukocytes, nitrites, protein, and blood, consistent with acute cystitis.  Urine culture and cytology swab are pending.  Will start patient on cefuroxime  200 mg twice daily for the next 7 days.   Supportive care recommendations were provided and discussed with the patient to include increasing fluids, rest, over-the-counter analgesics, developing a toileting schedule, and avoiding caffeine.  Patient advised to follow-up with urology as scheduled.  Patient was also given strict ER follow-up precautions.  Patient was in agreement with this plan of care and verbalizes understanding.  All questions were answered.   Patient stable for discharge.    Final Clinical Impressions(s) / UC Diagnoses   Final diagnoses:  Acute cystitis without hematuria     Discharge Instructions      The urinalysis shows that you do have a urinary tract infection.  A urine culture and cytology swab have been ordered.  You will be contacted when the results of the pending test are received.  You will also access to the results via MyChart. Take medication as prescribed. Make sure you are drinking at least 8-10 eight ounce glasses of water daily while symptoms persist. You may take over-the-counter Tylenol  as needed for pain or discomfort. Develop a toileting schedule that will allow you to urinate at least every 2 hours. Avoid caffeine such as tea, soda, or coffee while symptoms persist. Go to the emergency department immediately if you experience worsening urinary symptoms with fever, chills, pain around your kidneys, or other concerns. Follow-up with urology as scheduled. Follow-up as needed.       ED Prescriptions     Medication Sig Dispense Auth. Provider   cefUROXime  (CEFTIN ) 500 MG tablet Take 1 tablet (500 mg total) by mouth 2 (two) times daily with a meal for 7 days. 14  tablet Leath-Warren, Etta PARAS, NP      PDMP not reviewed this encounter.   Gilmer Etta PARAS, NP 03/06/24 1402

## 2024-03-07 LAB — CERVICOVAGINAL ANCILLARY ONLY
Bacterial Vaginitis (gardnerella): NEGATIVE
Candida Glabrata: POSITIVE — AB
Candida Vaginitis: NEGATIVE
Comment: NEGATIVE
Comment: NEGATIVE
Comment: NEGATIVE

## 2024-03-08 NOTE — Patient Instructions (Incomplete)

## 2024-03-09 ENCOUNTER — Ambulatory Visit (HOSPITAL_COMMUNITY): Payer: Self-pay

## 2024-03-09 LAB — URINE CULTURE: Culture: 50000 — AB

## 2024-03-09 NOTE — Telephone Encounter (Signed)
 Patient is going to require treatment for multidrug-resistant Klebsiella pneumoniae with IV antibiotics.  Recommend she reach out to her primary care provider to discuss treatment options or go to the ED for further evaluation and treatment options.

## 2024-03-11 ENCOUNTER — Other Ambulatory Visit: Payer: Self-pay

## 2024-03-11 ENCOUNTER — Encounter (HOSPITAL_COMMUNITY): Payer: Self-pay

## 2024-03-11 ENCOUNTER — Inpatient Hospital Stay (HOSPITAL_COMMUNITY)
Admission: EM | Admit: 2024-03-11 | Discharge: 2024-03-15 | DRG: 872 | Disposition: A | Discharge status: Home Health | Attending: Family Medicine | Admitting: Family Medicine

## 2024-03-11 DIAGNOSIS — Z1612 Extended spectrum beta lactamase (ESBL) resistance: Secondary | ICD-10-CM | POA: Diagnosis present

## 2024-03-11 DIAGNOSIS — I480 Paroxysmal atrial fibrillation: Secondary | ICD-10-CM | POA: Diagnosis present

## 2024-03-11 DIAGNOSIS — Z79899 Other long term (current) drug therapy: Secondary | ICD-10-CM

## 2024-03-11 DIAGNOSIS — Z87440 Personal history of urinary (tract) infections: Secondary | ICD-10-CM | POA: Diagnosis not present

## 2024-03-11 DIAGNOSIS — Z1624 Resistance to multiple antibiotics: Secondary | ICD-10-CM | POA: Diagnosis present

## 2024-03-11 DIAGNOSIS — R339 Retention of urine, unspecified: Secondary | ICD-10-CM | POA: Diagnosis not present

## 2024-03-11 DIAGNOSIS — G9341 Metabolic encephalopathy: Principal | ICD-10-CM

## 2024-03-11 DIAGNOSIS — E66811 Obesity, class 1: Secondary | ICD-10-CM | POA: Diagnosis present

## 2024-03-11 DIAGNOSIS — N179 Acute kidney failure, unspecified: Secondary | ICD-10-CM | POA: Diagnosis present

## 2024-03-11 DIAGNOSIS — Z8619 Personal history of other infectious and parasitic diseases: Secondary | ICD-10-CM | POA: Diagnosis not present

## 2024-03-11 DIAGNOSIS — R32 Unspecified urinary incontinence: Secondary | ICD-10-CM | POA: Diagnosis present

## 2024-03-11 DIAGNOSIS — A4159 Other Gram-negative sepsis: Secondary | ICD-10-CM | POA: Diagnosis present

## 2024-03-11 DIAGNOSIS — A4150 Gram-negative sepsis, unspecified: Secondary | ICD-10-CM | POA: Diagnosis present

## 2024-03-11 DIAGNOSIS — Z888 Allergy status to other drugs, medicaments and biological substances status: Secondary | ICD-10-CM

## 2024-03-11 DIAGNOSIS — B961 Klebsiella pneumoniae [K. pneumoniae] as the cause of diseases classified elsewhere: Secondary | ICD-10-CM | POA: Diagnosis present

## 2024-03-11 DIAGNOSIS — F32A Depression, unspecified: Secondary | ICD-10-CM | POA: Diagnosis present

## 2024-03-11 DIAGNOSIS — E876 Hypokalemia: Secondary | ICD-10-CM | POA: Diagnosis present

## 2024-03-11 DIAGNOSIS — F411 Generalized anxiety disorder: Secondary | ICD-10-CM | POA: Diagnosis present

## 2024-03-11 DIAGNOSIS — E871 Hypo-osmolality and hyponatremia: Secondary | ICD-10-CM | POA: Diagnosis present

## 2024-03-11 DIAGNOSIS — Z6833 Body mass index (BMI) 33.0-33.9, adult: Secondary | ICD-10-CM | POA: Diagnosis not present

## 2024-03-11 DIAGNOSIS — E139 Other specified diabetes mellitus without complications: Secondary | ICD-10-CM | POA: Diagnosis present

## 2024-03-11 DIAGNOSIS — I10 Essential (primary) hypertension: Secondary | ICD-10-CM | POA: Diagnosis present

## 2024-03-11 DIAGNOSIS — E1365 Other specified diabetes mellitus with hyperglycemia: Secondary | ICD-10-CM | POA: Diagnosis present

## 2024-03-11 DIAGNOSIS — R6520 Severe sepsis without septic shock: Secondary | ICD-10-CM | POA: Diagnosis present

## 2024-03-11 DIAGNOSIS — A419 Sepsis, unspecified organism: Secondary | ICD-10-CM

## 2024-03-11 DIAGNOSIS — B9689 Other specified bacterial agents as the cause of diseases classified elsewhere: Secondary | ICD-10-CM | POA: Diagnosis not present

## 2024-03-11 DIAGNOSIS — Z7901 Long term (current) use of anticoagulants: Secondary | ICD-10-CM | POA: Diagnosis not present

## 2024-03-11 DIAGNOSIS — Z90710 Acquired absence of both cervix and uterus: Secondary | ICD-10-CM

## 2024-03-11 DIAGNOSIS — N1330 Unspecified hydronephrosis: Secondary | ICD-10-CM | POA: Diagnosis not present

## 2024-03-11 DIAGNOSIS — R7881 Bacteremia: Secondary | ICD-10-CM | POA: Diagnosis not present

## 2024-03-11 DIAGNOSIS — N390 Urinary tract infection, site not specified: Secondary | ICD-10-CM | POA: Diagnosis present

## 2024-03-11 DIAGNOSIS — N136 Pyonephrosis: Secondary | ICD-10-CM | POA: Diagnosis present

## 2024-03-11 DIAGNOSIS — Z604 Social exclusion and rejection: Secondary | ICD-10-CM | POA: Diagnosis present

## 2024-03-11 DIAGNOSIS — Z7985 Long-term (current) use of injectable non-insulin antidiabetic drugs: Secondary | ICD-10-CM | POA: Diagnosis not present

## 2024-03-11 DIAGNOSIS — D649 Anemia, unspecified: Secondary | ICD-10-CM | POA: Diagnosis present

## 2024-03-11 DIAGNOSIS — B9620 Unspecified Escherichia coli [E. coli] as the cause of diseases classified elsewhere: Secondary | ICD-10-CM | POA: Diagnosis present

## 2024-03-11 DIAGNOSIS — N320 Bladder-neck obstruction: Secondary | ICD-10-CM | POA: Diagnosis not present

## 2024-03-11 LAB — CBC WITH DIFFERENTIAL/PLATELET
Abs Immature Granulocytes: 0.11 K/uL — ABNORMAL HIGH (ref 0.00–0.07)
Basophils Absolute: 0 K/uL (ref 0.0–0.1)
Basophils Relative: 0 %
Eosinophils Absolute: 0.2 K/uL (ref 0.0–0.5)
Eosinophils Relative: 1 %
HCT: 27.5 % — ABNORMAL LOW (ref 36.0–46.0)
Hemoglobin: 8.4 g/dL — ABNORMAL LOW (ref 12.0–15.0)
Immature Granulocytes: 1 %
Lymphocytes Relative: 12 %
Lymphs Abs: 2.2 K/uL (ref 0.7–4.0)
MCH: 26.4 pg (ref 26.0–34.0)
MCHC: 30.5 g/dL (ref 30.0–36.0)
MCV: 86.5 fL (ref 80.0–100.0)
Monocytes Absolute: 1.2 K/uL — ABNORMAL HIGH (ref 0.1–1.0)
Monocytes Relative: 7 %
Neutro Abs: 14.3 K/uL — ABNORMAL HIGH (ref 1.7–7.7)
Neutrophils Relative %: 79 %
Platelets: 312 K/uL (ref 150–400)
RBC: 3.18 MIL/uL — ABNORMAL LOW (ref 3.87–5.11)
RDW: 15.2 % (ref 11.5–15.5)
WBC: 18 K/uL — ABNORMAL HIGH (ref 4.0–10.5)
nRBC: 0 % (ref 0.0–0.2)

## 2024-03-11 LAB — COMPREHENSIVE METABOLIC PANEL WITH GFR
ALT: 11 U/L (ref 0–44)
AST: 18 U/L (ref 15–41)
Albumin: 2.2 g/dL — ABNORMAL LOW (ref 3.5–5.0)
Alkaline Phosphatase: 147 U/L — ABNORMAL HIGH (ref 38–126)
Anion gap: 12 (ref 5–15)
BUN: 15 mg/dL (ref 8–23)
CO2: 24 mmol/L (ref 22–32)
Calcium: 8.6 mg/dL — ABNORMAL LOW (ref 8.9–10.3)
Chloride: 95 mmol/L — ABNORMAL LOW (ref 98–111)
Creatinine, Ser: 1.17 mg/dL — ABNORMAL HIGH (ref 0.44–1.00)
GFR, Estimated: 51 mL/min — ABNORMAL LOW (ref >60)
Glucose, Bld: 204 mg/dL — ABNORMAL HIGH (ref 70–99)
Potassium: 2.7 mmol/L — CL (ref 3.5–5.1)
Sodium: 131 mmol/L — ABNORMAL LOW (ref 135–145)
Total Bilirubin: 0.6 mg/dL (ref 0.0–1.2)
Total Protein: 5.6 g/dL — ABNORMAL LOW (ref 6.5–8.1)

## 2024-03-11 LAB — GLUCOSE, CAPILLARY: Glucose-Capillary: 221 mg/dL — ABNORMAL HIGH (ref 70–99)

## 2024-03-11 LAB — URINALYSIS, ROUTINE W REFLEX MICROSCOPIC
Bilirubin Urine: NEGATIVE
Glucose, UA: >=500 mg/dL — AB
Ketones, ur: NEGATIVE mg/dL
Nitrite: POSITIVE — AB
Protein, ur: 100 mg/dL — AB
Specific Gravity, Urine: 1.007 (ref 1.005–1.030)
WBC, UA: >50 WBC/hpf (ref 0–5)
pH: 7 (ref 5.0–8.0)

## 2024-03-11 LAB — LACTIC ACID, PLASMA
Lactic Acid, Venous: 2.7 mmol/L (ref 0.5–1.9)
Lactic Acid, Venous: 1.4 mmol/L (ref 0.5–1.9)

## 2024-03-11 LAB — MAGNESIUM: Magnesium: 1.5 mg/dL — ABNORMAL LOW (ref 1.7–2.4)

## 2024-03-11 MED ORDER — SODIUM CHLORIDE 0.9 % IV SOLN
1.0000 g | Freq: Three times a day (TID) | INTRAVENOUS | Status: DC
  Administered 2024-03-11 – 2024-03-14 (×8): 1 g via INTRAVENOUS
  Filled 2024-03-11 (×9): qty 20

## 2024-03-11 MED ORDER — LACTATED RINGERS IV BOLUS
1000.0000 mL | Freq: Once | INTRAVENOUS | Status: AC
  Administered 2024-03-11: 1000 mL via INTRAVENOUS

## 2024-03-11 MED ORDER — INSULIN ASPART 100 UNIT/ML IJ SOLN
0.0000 [IU] | Freq: Every day | INTRAMUSCULAR | Status: DC
  Administered 2024-03-11: 2 [IU] via SUBCUTANEOUS

## 2024-03-11 MED ORDER — ONDANSETRON HCL 4 MG PO TABS: 4.0000 mg | ORAL_TABLET | Freq: Four times a day (QID) | ORAL | Status: DC | PRN

## 2024-03-11 MED ORDER — POTASSIUM CHLORIDE IN NACL 40-0.9 MEQ/L-% IV SOLN: INTRAVENOUS | Status: AC

## 2024-03-11 MED ORDER — POTASSIUM CHLORIDE 10 MEQ/100ML IV SOLN
10.0000 meq | INTRAVENOUS | Status: AC
  Administered 2024-03-11 (×4): 10 meq via INTRAVENOUS
  Filled 2024-03-11 (×4): qty 100

## 2024-03-11 MED ORDER — ACETAMINOPHEN 325 MG PO TABS
650.0000 mg | ORAL_TABLET | Freq: Four times a day (QID) | ORAL | Status: DC | PRN
  Administered 2024-03-12 – 2024-03-13 (×5): 650 mg via ORAL
  Filled 2024-03-11 (×5): qty 2

## 2024-03-11 MED ORDER — PIPERACILLIN-TAZOBACTAM 3.375 G IVPB 30 MIN
3.3750 g | Freq: Once | INTRAVENOUS | Status: AC
Start: 2024-03-11 — End: 2024-03-11
  Administered 2024-03-11: 3.375 g via INTRAVENOUS
  Filled 2024-03-11: qty 50

## 2024-03-11 MED ORDER — APIXABAN 5 MG PO TABS
5.0000 mg | ORAL_TABLET | Freq: Two times a day (BID) | ORAL | Status: DC
  Administered 2024-03-11 – 2024-03-15 (×8): 5 mg via ORAL
  Filled 2024-03-11 (×8): qty 1

## 2024-03-11 MED ORDER — INSULIN ASPART 100 UNIT/ML IJ SOLN
0.0000 [IU] | Freq: Three times a day (TID) | INTRAMUSCULAR | Status: DC
  Administered 2024-03-12: 3 [IU] via SUBCUTANEOUS
  Administered 2024-03-12: 8 [IU] via SUBCUTANEOUS

## 2024-03-11 MED ORDER — POLYETHYLENE GLYCOL 3350 17 G PO PACK: 17.0000 g | PACK | Freq: Every day | ORAL | Status: DC | PRN

## 2024-03-11 MED ORDER — ACETAMINOPHEN 650 MG RE SUPP: 650.0000 mg | Freq: Four times a day (QID) | RECTAL | Status: DC | PRN

## 2024-03-11 MED ORDER — POTASSIUM CHLORIDE CRYS ER 20 MEQ PO TBCR
40.0000 meq | EXTENDED_RELEASE_TABLET | Freq: Once | ORAL | Status: AC
  Administered 2024-03-11: 40 meq via ORAL
  Filled 2024-03-11: qty 2

## 2024-03-11 MED ORDER — LACTATED RINGERS IV BOLUS
1700.0000 mL | Freq: Once | INTRAVENOUS | Status: AC
  Administered 2024-03-11: 1700 mL via INTRAVENOUS

## 2024-03-11 MED ORDER — ONDANSETRON HCL 4 MG/2ML IJ SOLN: 4.0000 mg | Freq: Four times a day (QID) | INTRAMUSCULAR | Status: DC | PRN

## 2024-03-11 NOTE — ED Provider Notes (Signed)
 [place] EMERGENCY DEPARTMENT AT [facility] Provider Note   CSN: [id number] Arrival date & time: 03/11/24  1142     Patient presents with: Urinary Frequency   Amy Livingston is a 68 y.o. female with a history including type 2 diabetes, anxiety, atrial fibrillation on Eliquis , hypertension and GERD and frequent problems with UTIs stating she was admitted in March of this year for IV antibiotics secondary to a resistant UTI and has had multiple antibiotics since as she continues to have symptoms of dysuria.  She is most recently seen at a urgent care center 5 days ago and she was positive for UTI, she is currently on a course of Ceftin  but has had no improvement in her symptoms.  She describes frequent urination with dysuria, the dysuria is somewhat improved as she started taking Azo this morning but does endorse now having very orange urine.  She has had fever to 103, she has had no antipyretics today and presents afebrile.  She also endorses nausea without emesis.  She reports generalized fatigue and weakness along with poor appetite.  She is also developed some diarrhea, brown watery stools, 2 episodes today.  Reports she has had C. difficile in the past and is concerned about recurrence of this.  She also endorses some hallucinations, describing she has been seen dark spots in her field of vision and also has hallucinating seeing flowers and bears.  Daughter at bedside confirms and states she had similar symptoms with her admission in March with her UTI.  The history is provided by the patient.       Prior to Admission medications   Medication Sig Start Date End Date Taking? Authorizing Provider  atorvastatin  (LIPITOR) 80 MG tablet TAKE 1 TABLET(80 MG) BY MOUTH DAILY 02/28/24   Zarwolo, Gloria, FNP  busPIRone  (BUSPAR ) 10 MG tablet Take 1 tablet (10 mg total) by mouth 2 (two) times daily. 06/16/23   Zarwolo, Gloria, FNP  cefUROXime  (CEFTIN ) 500 MG tablet Take 1 tablet (500 mg  total) by mouth 2 (two) times daily with a meal for 7 days. 03/06/24 03/13/24  Leath-Warren, Etta PARAS, NP  cephALEXin  (KEFLEX ) 500 MG capsule Take 1 capsule (500 mg total) by mouth 4 (four) times daily. 02/01/24   Leath-Warren, Etta PARAS, NP  Cholecalciferol (VITAMIN D -3) 25 MCG (1000 UT) CAPS Take 1,000 Units by mouth 2 (two) times daily at 10 am and 4 pm.    [provider]  cyclobenzaprine  (FLEXERIL ) 10 MG tablet Take 10 mg by mouth every 12 (twelve) hours.    [provider]  D-Mannose 500 MG CAPS Take 1 capsule by mouth daily. 12/07/23   Bevely Doffing, FNP  ELIQUIS  5 MG TABS tablet Take 5 mg by mouth 2 (two) times daily.    [provider]  famotidine  (PEPCID ) 20 MG tablet Take 1 tablet (20 mg total) by mouth daily. 01/27/24   Zarwolo, Gloria, FNP  gabapentin  (NEURONTIN ) 300 MG capsule TAKE 1 CAPSULE(300 MG) BY MOUTH THREE TIMES DAILY 01/12/24   Zarwolo, Gloria, FNP  glucose blood (ACCU-CHEK GUIDE TEST) test strip USE TO TEST BLOOD SUGAR EVERY MORNING, AT NOON, AND EVERY NIGHT AT BEDTIME. 12/06/23   Zarwolo, Gloria, FNP  insulin  isophane & regular human KwikPen (NOVOLIN  70/30 KWIKPEN) (70-30) 100 UNIT/ML KwikPen Inject 30 Units into the skin 2 (two) times daily with a meal. 02/17/24   Zarwolo, Gloria, FNP  meclizine  (ANTIVERT ) 25 MG tablet Take 1 tablet (25 mg total) by mouth daily as needed  for dizziness. 12/07/23   Bevely Doffing, FNP  metoprolol  tartrate (LOPRESSOR ) 50 MG tablet Take 0.5 tablets (25 mg total) by mouth daily as needed. 01/17/24   Mallipeddi, Vishnu P, MD  mirabegron  ER (MYRBETRIQ ) 25 MG TB24 tablet Take 1 tablet (25 mg total) by mouth at bedtime. 11/06/23   Johnson, Clanford L, MD  mometasone  (ELOCON ) 0.1 % cream Apply topically daily as needed for itch 02/15/24   Karis Clunes, MD  omeprazole  (PRILOSEC) 40 MG capsule Take 1 capsule (40 mg total) by mouth daily. 01/23/24   Zarwolo, Gloria, FNP  ondansetron  (ZOFRAN ) 4 MG tablet Take 1 tablet (4 mg total) by mouth every  6 (six) hours as needed for nausea. 10/14/23   Pearlean Manus, MD  oxyCODONE -acetaminophen  (PERCOCET) 10-325 MG tablet Take 1 tablet by mouth every 8 (eight) hours as needed for pain.    [provider]  sertraline  (ZOLOFT ) 50 MG tablet TAKE 1 TABLET(50 MG) BY MOUTH DAILY 02/28/24   Zarwolo, Gloria, FNP  sucralfate  (CARAFATE ) 1 g tablet Take 1 tablet (1 g total) by mouth 4 (four) times daily -  with meals and at bedtime. 01/27/24   Zarwolo, Gloria, FNP  tirzepatide  (MOUNJARO ) 12.5 MG/0.5ML Pen Inject 12.5 mg into the skin once a week. 01/23/24   Zarwolo, Gloria, FNP  traZODone  (DESYREL ) 100 MG tablet Take 1 tablet (100 mg total) by mouth at bedtime. 11/18/23   Zarwolo, Gloria, FNP    Allergies: Elemental sulfur and Metformin  and related    Review of Systems  Constitutional:  Positive for fatigue and fever.  HENT:  Negative for congestion and sore throat.   Eyes: Negative.   Respiratory:  Negative for chest tightness and shortness of breath.   Cardiovascular:  Negative for chest pain.  Gastrointestinal:  Positive for abdominal pain, diarrhea, nausea and vomiting.  Genitourinary: Negative.   Musculoskeletal:  Negative for arthralgias, joint swelling and neck pain.  Skin: Negative.  Negative for rash and wound.  Neurological:  Positive for weakness. Negative for dizziness, light-headedness, numbness and headaches.  Psychiatric/Behavioral: Negative.      Updated Vital Signs BP 125/63   Pulse 93   Temp 98.6 F (37 C) (Oral)   Resp (!) 21   Ht 5' 6 (1.676 m)   Wt 92.1 kg   SpO2 94%   BMI 32.77 kg/m   Physical Exam Vitals and nursing note reviewed.  Constitutional:      Appearance: She is well-developed.  HENT:     Head: Normocephalic and atraumatic.     Mouth/Throat:     Mouth: Mucous membranes are dry.  Eyes:     Conjunctiva/sclera: Conjunctivae normal.  Cardiovascular:     Rate and Rhythm: Normal rate and regular rhythm.     Heart sounds: Normal heart sounds.   Pulmonary:     Effort: Pulmonary effort is normal.     Breath sounds: Normal breath sounds. No wheezing.  Abdominal:     General: Bowel sounds are normal.     Palpations: Abdomen is soft.     Tenderness: There is abdominal tenderness. There is no right CVA tenderness, left CVA tenderness or guarding.     Comments: Mild suprapubic tenderness without guarding or rebound.  Musculoskeletal:        General: Normal range of motion.     Cervical back: Normal range of motion.  Skin:    General: Skin is warm and dry.  Neurological:     Mental Status: She is alert.     (  all labs ordered are listed, but only abnormal results are displayed) Labs Reviewed  URINALYSIS, ROUTINE W REFLEX MICROSCOPIC - Abnormal; Notable for the following components:      Result Value   Color, Urine AMBER (*)    APPearance CLOUDY (*)    Glucose, UA >=500 (*)    Hgb urine dipstick LARGE (*)    Protein, ur 100 (*)    Nitrite POSITIVE (*)    Leukocytes,Ua MODERATE (*)    Bacteria, UA MANY (*)    Non Squamous Epithelial 0-5 (*)    All other components within normal limits  CBC WITH DIFFERENTIAL/PLATELET - Abnormal; Notable for the following components:   WBC 18.0 (*)    RBC 3.18 (*)    Hemoglobin 8.4 (*)    HCT 27.5 (*)    Neutro Abs 14.3 (*)    Monocytes Absolute 1.2 (*)    Abs Immature Granulocytes 0.11 (*)    All other components within normal limits  COMPREHENSIVE METABOLIC PANEL WITH GFR - Abnormal; Notable for the following components:   Sodium 131 (*)    Potassium 2.7 (*)    Chloride 95 (*)    Glucose, Bld 204 (*)    Creatinine, Ser 1.17 (*)    Calcium  8.6 (*)    Total Protein 5.6 (*)    Albumin 2.2 (*)    Alkaline Phosphatase 147 (*)    GFR, Estimated 51 (*)    All other components within normal limits  LACTIC ACID, PLASMA - Abnormal; Notable for the following components:   Lactic Acid, Venous 2.7 (*)    All other components within normal limits  CULTURE, BLOOD (ROUTINE X 2)  CULTURE,  BLOOD (ROUTINE X 2)  C DIFFICILE QUICK SCREEN W PCR REFLEX    LACTIC ACID, PLASMA  MAGNESIUM     EKG: None  Radiology: No results found.   Procedures   Medications Ordered in the ED  potassium chloride  10 mEq in 100 mL IVPB (10 mEq Intravenous New Bag/Given 03/11/24 1603)  meropenem  (MERREM ) 1 g in sodium chloride  0.9 % 100 mL IVPB (has no administration in time range)  lactated ringers  bolus 1,000 mL (0 mLs Intravenous Stopped 03/11/24 1529)  piperacillin -tazobactam (ZOSYN ) IVPB 3.375 g (0 g Intravenous Stopped 03/11/24 1530)  lactated ringers  bolus 1,700 mL (1,700 mLs Intravenous New Bag/Given 03/11/24 1601)                                    Medical Decision Making Patient presenting with persistent UTI symptoms despite day 5 of Ceftin .  Review of chart indicates a culture was obtained at her urgent care visit which grew E. coli and Klebsiella, resistant to most antibiotics but is sensitive to Zosyn  which has been given here.  She is also received IV fluids lactated Ringer 's total of 30 cc/kg once sepsis was confirmed with elevation in her lactic acid and leukocytosis to 18,000.  Labs are also significant for a hypokalemia at 2.7, 4 runs IV have been ordered.  Patient will need admission.  Amount and/or Complexity of Data Reviewed Labs: ordered.    Details: C-Met significant for sodium of 131, potassium of 2.7, her BUN is normal but she has a slight bump in her creatinine at 1.17, her total protein and albumin are reduced, she has a GFR of 51, her anion gap is normal at 12 but she does have a glucose of 204 lactic acid  elevated 2.7, WBC count of 18,000 she is also anemic with a hemoglobin of 8.4 her last hemoglobin was 10.1  months ago. Discussion of management or test interpretation with external provider(s): Call placed to the hospitalist for admission.  Risk Decision regarding hospitalization.        Final diagnoses:  Sepsis with encephalopathy without septic shock, due to  unspecified organism [facility])  UTI due to Klebsiella species    ED Discharge Orders     None          Birdena Mliss RIGGERS 03/11/24 1637    Cleotilde Rogue, MD 03/11/24 [phone]

## 2024-03-11 NOTE — Assessment & Plan Note (Addendum)
 Complicated UTI.  Recent urine cultures from 7/29 grew sensitive E. coli and multidrug resistant Klebsiella-sensitive to imipenem and Zosyn . Meeting severe sepsis criteria with tachycardia heart rate 93-102, leukocytosis of 18, evidence of endorgan dysfunction, lactic acid 2.7 >> 1.4 and AKI.  -Pharmacy consulted-recommended meropenem  over Zosyn  -1.7L sepsis fluid bolus given, cont N/s + 40kcl 100cc/hr x 15hrs - Follow-up blood cultures

## 2024-03-11 NOTE — Assessment & Plan Note (Addendum)
 Heart rate 93-104. - Resume Eliquis 

## 2024-03-11 NOTE — Assessment & Plan Note (Addendum)
 Pending med reconciliation - resume buspirone , Zoloft 

## 2024-03-11 NOTE — ED Triage Notes (Signed)
 Pt reports recurrent UTI but is also reporting increased weakness, hallucinations and C diff symptoms. Pt stated that she was seen here for all the same problems a month ago and believes that it is coming back

## 2024-03-11 NOTE — Assessment & Plan Note (Addendum)
-   HGbA1c - SSI- M -Supposed to be on NPH 30 units BID-ran out a week ago

## 2024-03-11 NOTE — Assessment & Plan Note (Addendum)
 Stable

## 2024-03-11 NOTE — Assessment & Plan Note (Addendum)
 Creatinine 1.17, baseline 0.6.  In the setting of severe sepsis secondary to UTI, diarrhea. - hydrate

## 2024-03-11 NOTE — Assessment & Plan Note (Signed)
Magnesium 1.5

## 2024-03-11 NOTE — Assessment & Plan Note (Signed)
 Potassium 2.7 in the setting of diarrhea. -Replete

## 2024-03-11 NOTE — Assessment & Plan Note (Signed)
 Na- 131. - hydrate

## 2024-03-11 NOTE — H&P (Signed)
 History and Physical    Treniyah Lynn FMW:[id number] DOB: [birth year] DOA: 03/11/2024  PCP: Zarwolo, Gloria, FNP   Patient coming from: Home  I have personally briefly reviewed patient's old medical records in [facility]  Chief Complaint: Urinary symptoms, Diarrhea  HPI: Amy Livingston is a 68 y.o. female with medical history significant for diabetes mellitus, hypertension, paroxysmal atrial fibrillation, anxiety and depression. Patient presented to the ED with complaints of weakness, pain with urination and urinary frequency.  Patient went to urgent care 7/29 with urinary symptoms, was diagnosed with UTI, she was discharged on Ceftin .  Urine cultures obtained grew E. coli and multidrug resistant Klebsiella.  Attempts were made to reach patient, but patient said she felt ill and was not checking her phone. She reports a fever of 103 at home.  Reports onset of watery stools over the past 2 days, about 3 times a day.  No vomiting.  No abdominal pain.  She says her symptoms reminded of her previous episode of C. difficile infection.   Patient reports for the past week she has intermittently been seeing black spots or cartoon characters, weird things that are not actually.  She is not having this currently. No confused speech or alteration in consciousness.  ED Course: T-max- 99.  Heart rate 93-104.  Respiratory rate 16-21.  Blood pressure systolic 116-133.  O2 sat greater than 94% on room air. WBC 18. Potassium 2.7. Sodium 131. Creatinine elevated 1.17. UA suggestive of UTI. Blood Cultures obtained. IV Zosyn  started.    Review of Systems: As per HPI all other systems reviewed and negative.  Past Medical History:  Diagnosis Date   Anxiety    Atrial fibrillation (HCC)    Diabetes mellitus without complication (HCC)    Dysrhythmia    GERD (gastroesophageal reflux disease)    Hypertension    PONV (postoperative nausea and vomiting)    Shortness of breath dyspnea     Past  Surgical History:  Procedure Laterality Date   ABDOMINAL HYSTERECTOMY     ANTERIOR CERVICAL DECOMP/DISCECTOMY FUSION N/A 01/13/2016   Procedure: C5-6 ANTERIOR CERVICAL DECOMPRESSION/DISKECTOMY/FUSION;  Surgeon: Victory Gens, MD;  Location: [facility];  Service: Neurosurgery;  Laterality: N/A;  C5-6 ANTERIOR CERVICAL DECOMPRESSION/DISKECTOMY/FUSION   BACK SURGERY     x 5   BIOPSY  07/25/2023   Procedure: BIOPSY;  Surgeon: Cindie Carlin POUR, DO;  Location: [facility];  Service: Endoscopy;;   CHOLECYSTECTOMY     COLONOSCOPY WITH PROPOFOL  N/A 07/25/2023   Procedure: COLONOSCOPY WITH PROPOFOL ;  Surgeon: Cindie Carlin POUR, DO;  Location: [facility];  Service: Endoscopy;  Laterality: N/A;  10:45 am, asa 3   ESOPHAGEAL BRUSHING  07/25/2023   Procedure: ESOPHAGEAL BRUSHING;  Surgeon: Cindie Carlin POUR, DO;  Location: [facility];  Service: Endoscopy;;   ESOPHAGOGASTRODUODENOSCOPY (EGD) WITH PROPOFOL  N/A 07/25/2023   Procedure: ESOPHAGOGASTRODUODENOSCOPY (EGD) WITH PROPOFOL ;  Surgeon: Cindie Carlin POUR, DO;  Location: [facility];  Service: Endoscopy;  Laterality: N/A;   FOOT SURGERY     KNEE ARTHROSCOPY     left   KNEE SURGERY Right    POLYPECTOMY  07/25/2023   Procedure: POLYPECTOMY INTESTINAL;  Surgeon: Cindie Carlin POUR, DO;  Location: [facility];  Service: Endoscopy;;   TEE WITHOUT CARDIOVERSION N/A 10/13/2023   Procedure: ECHOCARDIOGRAM, TRANSESOPHAGEAL;  Surgeon: Stacia Diannah SQUIBB, MD;  Location: [facility];  Service: Cardiovascular;  Laterality: N/A;   TUBAL LIGATION       reports that she has  never smoked. She has never used smokeless tobacco. She reports that she does not drink alcohol and does not use drugs.  Allergies  Allergen Reactions   Elemental Sulfur Itching and Rash    Yeast infections   Metformin  And Related Diarrhea    GI distress   Family history of hypertension.  Prior to Admission medications   Medication Sig Start Date End Date Taking? Authorizing  Provider  atorvastatin  (LIPITOR) 80 MG tablet TAKE 1 TABLET(80 MG) BY MOUTH DAILY 02/28/24   Zarwolo, Gloria, FNP  busPIRone  (BUSPAR ) 10 MG tablet Take 1 tablet (10 mg total) by mouth 2 (two) times daily. 06/16/23   Zarwolo, Gloria, FNP  cefUROXime  (CEFTIN ) 500 MG tablet Take 1 tablet (500 mg total) by mouth 2 (two) times daily with a meal for 7 days. 03/06/24 03/13/24  Leath-Warren, Etta PARAS, NP  cephALEXin  (KEFLEX ) 500 MG capsule Take 1 capsule (500 mg total) by mouth 4 (four) times daily. 02/01/24   Leath-Warren, Etta PARAS, NP  Cholecalciferol (VITAMIN D -3) 25 MCG (1000 UT) CAPS Take 1,000 Units by mouth 2 (two) times daily at 10 am and 4 pm.    [provider]  cyclobenzaprine  (FLEXERIL ) 10 MG tablet Take 10 mg by mouth every 12 (twelve) hours.    [provider]  D-Mannose 500 MG CAPS Take 1 capsule by mouth daily. 12/07/23   Bevely Doffing, FNP  ELIQUIS  5 MG TABS tablet Take 5 mg by mouth 2 (two) times daily.    [provider]  famotidine  (PEPCID ) 20 MG tablet Take 1 tablet (20 mg total) by mouth daily. 01/27/24   Zarwolo, Gloria, FNP  gabapentin  (NEURONTIN ) 300 MG capsule TAKE 1 CAPSULE(300 MG) BY MOUTH THREE TIMES DAILY 01/12/24   Zarwolo, Gloria, FNP  glucose blood (ACCU-CHEK GUIDE TEST) test strip USE TO TEST BLOOD SUGAR EVERY MORNING, AT NOON, AND EVERY NIGHT AT BEDTIME. 12/06/23   Zarwolo, Gloria, FNP  insulin  isophane & regular human KwikPen (NOVOLIN  70/30 KWIKPEN) (70-30) 100 UNIT/ML KwikPen Inject 30 Units into the skin 2 (two) times daily with a meal. 02/17/24   Zarwolo, Gloria, FNP  meclizine  (ANTIVERT ) 25 MG tablet Take 1 tablet (25 mg total) by mouth daily as needed for dizziness. 12/07/23   Bevely Doffing, FNP  metoprolol  tartrate (LOPRESSOR ) 50 MG tablet Take 0.5 tablets (25 mg total) by mouth daily as needed. 01/17/24   Mallipeddi, Diannah SQUIBB, MD  mirabegron  ER (MYRBETRIQ ) 25 MG TB24 tablet Take 1 tablet (25 mg total) by mouth at bedtime. 11/06/23   Johnson,  Clanford L, MD  mometasone  (ELOCON ) 0.1 % cream Apply topically daily as needed for itch 02/15/24   Karis Clunes, MD  omeprazole  (PRILOSEC) 40 MG capsule Take 1 capsule (40 mg total) by mouth daily. 01/23/24   Zarwolo, Gloria, FNP  ondansetron  (ZOFRAN ) 4 MG tablet Take 1 tablet (4 mg total) by mouth every 6 (six) hours as needed for nausea. 10/14/23   Pearlean Manus, MD  oxyCODONE -acetaminophen  (PERCOCET) 10-325 MG tablet Take 1 tablet by mouth every 8 (eight) hours as needed for pain.    [provider]  sertraline  (ZOLOFT ) 50 MG tablet TAKE 1 TABLET(50 MG) BY MOUTH DAILY 02/28/24   Zarwolo, Gloria, FNP  sucralfate  (CARAFATE ) 1 g tablet Take 1 tablet (1 g total) by mouth 4 (four) times daily -  with meals and at bedtime. 01/27/24   Zarwolo, Gloria, FNP  tirzepatide  (MOUNJARO ) 12.5 MG/0.5ML Pen Inject 12.5 mg into the skin once a week. 01/23/24  Zarwolo, Gloria, FNP  traZODone  (DESYREL ) 100 MG tablet Take 1 tablet (100 mg total) by mouth at bedtime. 11/18/23   Zarwolo, Gloria, FNP    Physical Exam: Vitals:   03/11/24 1152 03/11/24 1154 03/11/24 1500  BP:  (!) 116/57 125/63  Pulse:  (!) 102 93  Resp:  16 (!) 21  Temp:  98.6 F (37 C)   TempSrc:  Oral   SpO2:  94% 94%  Weight: 92.1 kg    Height: 5' 6 (1.676 m)      Constitutional: NAD, calm, comfortable Vitals:   03/11/24 1152 03/11/24 1154 03/11/24 1500  BP:  (!) 116/57 125/63  Pulse:  (!) 102 93  Resp:  16 (!) 21  Temp:  98.6 F (37 C)   TempSrc:  Oral   SpO2:  94% 94%  Weight: 92.1 kg    Height: 5' 6 (1.676 m)     Eyes: PERRL, lids and conjunctivae normal ENMT: Mucous membranes are moist. Neck: normal, supple, no masses, no thyromegaly Respiratory: clear to auscultation bilaterally, no wheezing, no crackles. Normal respiratory effort. No accessory muscle use.  Cardiovascular: Tachycardic, regular rate and rhythm, no murmurs / rubs / gallops. No extremity edema.  Abdomen: no tenderness, no masses palpated. No  hepatosplenomegaly.   Musculoskeletal: no clubbing / cyanosis. No joint deformity upper and lower extremities.  Skin: no rashes, lesions, ulcers. No induration Neurologic: No facial asymmetry, moving extremities spontaneously, speech fluent. Psychiatric: Normal judgment and insight. Alert and oriented x 3. Normal mood.   Labs on Admission: I have personally reviewed following labs and imaging studies  CBC: Recent Labs  Lab 03/11/24 1449  WBC 18.0*  NEUTROABS 14.3*  HGB 8.4*  HCT 27.5*  MCV 86.5  PLT 312   Basic Metabolic Panel: Recent Labs  Lab 03/11/24 1449  NA 131*  K 2.7*  CL 95*  CO2 24  GLUCOSE 204*  BUN 15  CREATININE 1.17*  CALCIUM  8.6*   GFR: Estimated Creatinine Clearance: 53.3 mL/min (A) (by C-G formula based on SCr of 1.17 mg/dL (H)). Liver Function Tests: Recent Labs  Lab 03/11/24 1449  AST 18  ALT 11  ALKPHOS 147*  BILITOT 0.6  PROT 5.6*  ALBUMIN 2.2*   Urine analysis:    Component Value Date/Time   COLORURINE AMBER (A) 03/11/2024 1320   APPEARANCEUR CLOUDY (A) 03/11/2024 1320   APPEARANCEUR Cloudy (A) 11/18/2023 1145   LABSPEC 1.007 03/11/2024 1320   PHURINE 7.0 03/11/2024 1320   GLUCOSEU >=500 (A) 03/11/2024 1320   HGBUR LARGE (A) 03/11/2024 1320   BILIRUBINUR NEGATIVE 03/11/2024 1320   BILIRUBINUR negative 03/06/2024 1337   BILIRUBINUR Negative 11/18/2023 1145   KETONESUR NEGATIVE 03/11/2024 1320   PROTEINUR 100 (A) 03/11/2024 1320   UROBILINOGEN 1.0 03/06/2024 1337   NITRITE POSITIVE (A) 03/11/2024 1320   LEUKOCYTESUR MODERATE (A) 03/11/2024 1320    Radiological Exams on Admission: No results found.  EKG: None  Assessment/Plan Principal Problem:   Sepsis due to gram-negative UTI (HCC) Active Problems:   Hypokalemia   Hyponatremia   Hypomagnesemia   Diabetes 1.5, managed as type 2 (HCC)   PAF (paroxysmal atrial fibrillation) (HCC)   Generalized anxiety disorder   Essential hypertension  Assessment and Plan: * Sepsis  due to gram-negative UTI (HCC) Complicated UTI.  Recent urine cultures from 7/29 grew sensitive E. coli and multidrug resistant Klebsiella-sensitive to imipenem and Zosyn . Meeting severe sepsis criteria with tachycardia heart rate 93-102, leukocytosis of 18, evidence of endorgan dysfunction, lactic acid  2.7 >> 1.4 and AKI.  -Pharmacy consulted-recommended meropenem  over Zosyn  -1.7L sepsis fluid bolus given, cont N/s + 40kcl 100cc/hr x 15hrs - Follow-up blood cultures  AKI (acute kidney injury) (HCC) Creatinine 1.17, baseline 0.6.  In the setting of severe sepsis secondary to UTI, diarrhea. - hydrate  Diarrhea-of 2 days duration.  No abdominal pain, no vomiting.  History of C. difficile infection - 10/09/2023. -Stool C. Difficile - Probiotics  Hypomagnesemia Magnesium  1.5.  Hyponatremia Na- 131. - hydrate  Hypokalemia Potassium 2.7 in the setting of diarrhea. -Replete  Essential hypertension Stable.  Generalized anxiety disorder Pending med reconciliation - resume buspirone , Zoloft   PAF (paroxysmal atrial fibrillation) (HCC) Heart rate 93-104. - Resume Eliquis    Diabetes 1.5, managed as type 2 (HCC) - HGbA1c - SSI- M -Supposed to be on NPH 30 units BID-ran out a week ago  DVT prophylaxis: Eliquis  Code Status: FULL Family Communication: Daughter-in-law Glenys at bedside Disposition Plan: ~ 2 days Consults called:  None  Admission status: Inpt Tele  I certify that at the point of admission it is my clinical judgment that the patient will require inpatient hospital care spanning beyond 2 midnights from the point of admission due to high intensity of service, high risk for further deterioration and high frequency of surveillance required.   Author: Tully FORBES Carwin, MD 03/11/2024 5:32 PM  For on call review [email or web address].

## 2024-03-12 ENCOUNTER — Ambulatory Visit: Admitting: Nurse Practitioner

## 2024-03-12 ENCOUNTER — Inpatient Hospital Stay (HOSPITAL_COMMUNITY)

## 2024-03-12 DIAGNOSIS — E782 Mixed hyperlipidemia: Secondary | ICD-10-CM

## 2024-03-12 DIAGNOSIS — Z7985 Long-term (current) use of injectable non-insulin antidiabetic drugs: Secondary | ICD-10-CM

## 2024-03-12 DIAGNOSIS — Z794 Long term (current) use of insulin: Secondary | ICD-10-CM

## 2024-03-12 DIAGNOSIS — A4150 Gram-negative sepsis, unspecified: Secondary | ICD-10-CM | POA: Diagnosis not present

## 2024-03-12 DIAGNOSIS — I10 Essential (primary) hypertension: Secondary | ICD-10-CM

## 2024-03-12 DIAGNOSIS — N390 Urinary tract infection, site not specified: Secondary | ICD-10-CM | POA: Diagnosis not present

## 2024-03-12 LAB — BLOOD CULTURE ID PANEL (REFLEXED) - BCID2

## 2024-03-12 LAB — BASIC METABOLIC PANEL WITH GFR
Anion gap: 8 (ref 5–15)
BUN: 15 mg/dL (ref 8–23)
CO2: 24 mmol/L (ref 22–32)
Calcium: 8.1 mg/dL — ABNORMAL LOW (ref 8.9–10.3)
Chloride: 102 mmol/L (ref 98–111)
Creatinine, Ser: 1.03 mg/dL — ABNORMAL HIGH (ref 0.44–1.00)
GFR, Estimated: 60 mL/min — ABNORMAL LOW (ref >60)
Glucose, Bld: 181 mg/dL — ABNORMAL HIGH (ref 70–99)
Potassium: 3.7 mmol/L (ref 3.5–5.1)
Sodium: 134 mmol/L — ABNORMAL LOW (ref 135–145)

## 2024-03-12 LAB — HEMOGLOBIN AND HEMATOCRIT, BLOOD
HCT: 25.6 % — ABNORMAL LOW (ref 36.0–46.0)
Hemoglobin: 8.1 g/dL — ABNORMAL LOW (ref 12.0–15.0)

## 2024-03-12 LAB — CBC
HCT: 24.6 % — ABNORMAL LOW (ref 36.0–46.0)
Hemoglobin: 7.6 g/dL — ABNORMAL LOW (ref 12.0–15.0)
MCH: 26.4 pg (ref 26.0–34.0)
MCHC: 30.9 g/dL (ref 30.0–36.0)
MCV: 85.4 fL (ref 80.0–100.0)
Platelets: 312 K/uL (ref 150–400)
RBC: 2.88 MIL/uL — ABNORMAL LOW (ref 3.87–5.11)
RDW: 15.5 % (ref 11.5–15.5)
WBC: 15.9 K/uL — ABNORMAL HIGH (ref 4.0–10.5)
nRBC: 0 % (ref 0.0–0.2)

## 2024-03-12 LAB — GLUCOSE, CAPILLARY
Glucose-Capillary: 165 mg/dL — ABNORMAL HIGH (ref 70–99)
Glucose-Capillary: 296 mg/dL — ABNORMAL HIGH (ref 70–99)
Glucose-Capillary: 248 mg/dL — ABNORMAL HIGH (ref 70–99)
Glucose-Capillary: 192 mg/dL — ABNORMAL HIGH (ref 70–99)

## 2024-03-12 MED ORDER — TAMSULOSIN HCL 0.4 MG PO CAPS
0.4000 mg | ORAL_CAPSULE | Freq: Every day | ORAL | Status: DC
  Administered 2024-03-12 – 2024-03-15 (×4): 0.4 mg via ORAL
  Filled 2024-03-12 (×4): qty 1

## 2024-03-12 MED ORDER — OXYCODONE HCL 5 MG PO TABS
5.0000 mg | ORAL_TABLET | Freq: Three times a day (TID) | ORAL | Status: DC | PRN
  Administered 2024-03-12 – 2024-03-14 (×5): 5 mg via ORAL
  Filled 2024-03-12 (×6): qty 1

## 2024-03-12 MED ORDER — ALUM & MAG HYDROXIDE-SIMETH 200-200-20 MG/5ML PO SUSP
30.0000 mL | ORAL | Status: DC | PRN
  Administered 2024-03-12 – 2024-03-15 (×2): 30 mL via ORAL
  Filled 2024-03-12 (×2): qty 30

## 2024-03-12 MED ORDER — SERTRALINE HCL 50 MG PO TABS
50.0000 mg | ORAL_TABLET | Freq: Every day | ORAL | Status: DC
  Administered 2024-03-12 – 2024-03-15 (×4): 50 mg via ORAL
  Filled 2024-03-12 (×4): qty 1

## 2024-03-12 MED ORDER — INSULIN ASPART 100 UNIT/ML IJ SOLN: 0.0000 [IU] | Freq: Every day | INTRAMUSCULAR | Status: DC

## 2024-03-12 MED ORDER — INSULIN ASPART 100 UNIT/ML IJ SOLN
6.0000 [IU] | Freq: Three times a day (TID) | INTRAMUSCULAR | Status: DC
  Administered 2024-03-12 – 2024-03-15 (×7): 6 [IU] via SUBCUTANEOUS

## 2024-03-12 MED ORDER — GABAPENTIN 300 MG PO CAPS
300.0000 mg | ORAL_CAPSULE | Freq: Three times a day (TID) | ORAL | Status: DC
  Administered 2024-03-12 – 2024-03-15 (×9): 300 mg via ORAL
  Filled 2024-03-12 (×9): qty 1

## 2024-03-12 MED ORDER — INSULIN ASPART 100 UNIT/ML IJ SOLN
0.0000 [IU] | Freq: Three times a day (TID) | INTRAMUSCULAR | Status: DC
  Administered 2024-03-12: 3 [IU] via SUBCUTANEOUS
  Administered 2024-03-13 (×2): 2 [IU] via SUBCUTANEOUS
  Administered 2024-03-14: 3 [IU] via SUBCUTANEOUS
  Administered 2024-03-14: 2 [IU] via SUBCUTANEOUS
  Administered 2024-03-15: 1 [IU] via SUBCUTANEOUS
  Administered 2024-03-15: 2 [IU] via SUBCUTANEOUS

## 2024-03-12 MED ORDER — VANCOMYCIN HCL 125 MG PO CAPS
125.0000 mg | ORAL_CAPSULE | Freq: Two times a day (BID) | ORAL | Status: DC
  Administered 2024-03-12 – 2024-03-15 (×7): 125 mg via ORAL
  Filled 2024-03-12 (×7): qty 1

## 2024-03-12 MED ORDER — TAMSULOSIN HCL 0.4 MG PO CAPS: 0.4000 mg | ORAL_CAPSULE | Freq: Every day | ORAL | Status: DC

## 2024-03-12 MED ORDER — MIRABEGRON ER 25 MG PO TB24
25.0000 mg | ORAL_TABLET | Freq: Every day | ORAL | Status: DC
  Administered 2024-03-12 – 2024-03-14 (×3): 25 mg via ORAL
  Filled 2024-03-12 (×3): qty 1

## 2024-03-12 MED ORDER — OXYCODONE-ACETAMINOPHEN 10-325 MG PO TABS: 1.0000 | ORAL_TABLET | Freq: Three times a day (TID) | ORAL | Status: DC | PRN

## 2024-03-12 MED ORDER — ATORVASTATIN CALCIUM 40 MG PO TABS
80.0000 mg | ORAL_TABLET | Freq: Every day | ORAL | Status: DC
  Administered 2024-03-12 – 2024-03-14 (×3): 80 mg via ORAL
  Filled 2024-03-12 (×3): qty 2

## 2024-03-12 MED ORDER — OXYCODONE-ACETAMINOPHEN 5-325 MG PO TABS
1.0000 | ORAL_TABLET | Freq: Three times a day (TID) | ORAL | Status: DC | PRN
  Administered 2024-03-12 – 2024-03-14 (×5): 1 via ORAL
  Filled 2024-03-12 (×5): qty 1

## 2024-03-12 MED ORDER — D-MANNOSE 500 MG PO CAPS: 1.0000 | ORAL_CAPSULE | Freq: Every day | ORAL | Status: DC

## 2024-03-12 MED ORDER — PANTOPRAZOLE SODIUM 40 MG PO TBEC
40.0000 mg | DELAYED_RELEASE_TABLET | Freq: Every day | ORAL | Status: DC
  Administered 2024-03-12 – 2024-03-15 (×4): 40 mg via ORAL
  Filled 2024-03-12 (×4): qty 1

## 2024-03-12 MED ORDER — CYCLOBENZAPRINE HCL 10 MG PO TABS
10.0000 mg | ORAL_TABLET | Freq: Two times a day (BID) | ORAL | Status: DC
  Administered 2024-03-12 – 2024-03-15 (×7): 10 mg via ORAL
  Filled 2024-03-12 (×7): qty 1

## 2024-03-12 MED ORDER — INSULIN GLARGINE-YFGN 100 UNIT/ML ~~LOC~~ SOLN
42.0000 [IU] | Freq: Every day | SUBCUTANEOUS | Status: DC
  Administered 2024-03-12 – 2024-03-15 (×4): 42 [IU] via SUBCUTANEOUS
  Filled 2024-03-12 (×5): qty 0.42

## 2024-03-12 NOTE — Plan of Care (Signed)

## 2024-03-12 NOTE — TOC Progression Note (Signed)
 Transition of Care [facility]) - Progression Note    Patient Details  Name: Amy Livingston MRN: [id number] Date of Birth: 1956-05-28  Transition of Care [facility]) CM/SW Contact  Mcarthur Saddie Kim, [place] Phone Number: 03/12/2024, 3:23 PM  Clinical Narrative:   PT/OT recommending home health and 3N1. LCSW discussed with pt who is agreeable with no preference on agency. She also requests HHRN. Referred and accepted by Darlyn with [facility] for 3N1. Order in. Leita with [facility] accepts for HHPT/OT/RN. Needs orders prior to d/c. TOC will follow.        Barriers to Discharge: Continued Medical Work up               Expected Discharge Plan and Services                         DME Arranged: 3-N-1 DME Agency: [facility] Date DME Agency Contacted: 03/12/24 Time DME Agency Contacted: [phone] Representative spoke with at DME Agency: Darlyn HH Arranged: PT, OT, RN HH Agency: [facility] Date HH Agency Contacted: 03/12/24 Time HH Agency Contacted: 1521 Representative spoke with at [facility] Agency: Lauren   Social Drivers of Health (SDOH) Interventions SDOH Screenings   Food Insecurity: No Food Insecurity (03/11/2024)  Housing: Low Risk  (03/11/2024)  Transportation Needs: No Transportation Needs (03/11/2024)  Utilities: Not At Risk (03/11/2024)  Alcohol Screen: Low Risk  (09/28/2023)  Depression (PHQ2-9): Low Risk  (12/07/2023)  Financial Resource Strain: Low Risk  (10/12/2023)  Physical Activity: Inactive (10/12/2023)  Social Connections: Socially Isolated (03/11/2024)  Stress: Stress Concern Present (10/12/2023)  Tobacco Use: Low Risk  (03/11/2024)  Health Literacy: Adequate Health Literacy (09/28/2023)    Readmission Risk Interventions     No data to display

## 2024-03-12 NOTE — Plan of Care (Signed)
  Problem: Acute Rehab PT Goals(only PT should resolve) Goal: Pt Will Go Supine/Side To Sit Outcome: Progressing Flowsheets (Taken 03/12/2024 1354) Pt will go Supine/Side to Sit: Independently Goal: Patient Will Transfer Sit To/From Stand Outcome: Progressing Flowsheets (Taken 03/12/2024 1354) Patient will transfer sit to/from stand: Independently Goal: Pt Will Transfer Bed To Chair/Chair To Bed Outcome: Progressing Flowsheets (Taken 03/12/2024 1354) Pt will Transfer Bed to Chair/Chair to Bed:  with supervision  with modified independence Goal: Pt Will Ambulate Outcome: Progressing Flowsheets (Taken 03/12/2024 1354) Pt will Ambulate:  50 feet  with supervision  with contact guard assist  with rolling walker   1:55 PM, 03/12/24 Lynwood Music, MPT Physical Therapist with [facility] [phone] office [phone] mobile phone

## 2024-03-12 NOTE — Progress Notes (Signed)
 Patient voided 700cc of amber/cloudy urine into collection tray , she was then bladder scanned and bladder scan revealed 377cc of residual urine in her bladder. Notified Dr. Maree   03/12/24 1500  Urine Measurement/Characteristics  Bladder Scan Volume (mL) 377 mL

## 2024-03-12 NOTE — Progress Notes (Signed)
 Attempted to catherize patient in and out x 2 unsuccessful , patient then ambulated to restroom and voided in the floor on the way and 200cc of amber cloudy urine in collection hat, patient then bladder scanened and revealed 627cc . Ronal Caldron RN , and whitney RN able to successfully catherize patient and 700cc of Amber/cloudy urine collected into collection bag

## 2024-03-12 NOTE — Progress Notes (Signed)
   03/12/24 1800  Urine Measurement/Characteristics  Urine (mL) 700 mL  Urinary Interventions Intermittent/Straight cath  Bladder Scan Volume (mL) 627 mL

## 2024-03-12 NOTE — Plan of Care (Signed)
  Problem: Acute Rehab OT Goals (only OT should resolve) Goal: Pt. Will Perform Grooming Flowsheets (Taken 03/12/2024 1136) Pt Will Perform Grooming:  with modified independence  standing Goal: Pt. Will Perform Lower Body Dressing Flowsheets (Taken 03/12/2024 1136) Pt Will Perform Lower Body Dressing: with modified independence Goal: Pt. Will Transfer To Toilet Flowsheets (Taken 03/12/2024 1136) Pt Will Transfer to Toilet:  with modified independence  ambulating Goal: Pt. Will Perform Toileting-Clothing Manipulation Flowsheets (Taken 03/12/2024 1136) Pt Will Perform Toileting - Clothing Manipulation and hygiene: with modified independence  Russell Quinney OT, MOT

## 2024-03-12 NOTE — Inpatient Diabetes Management (Signed)
 Inpatient Diabetes Program Recommendations  AACE/ADA: New Consensus Statement on Inpatient Glycemic Control (2015)  Target Ranges:  Prepandial:   less than 140 mg/dL      Peak postprandial:   less than 180 mg/dL (1-2 hours)      Critically ill patients:  140 - 180 mg/dL   Lab Results  Component Value Date   GLUCAP 165 (H) 03/12/2024   HGBA1C 12.4 (H) 10/09/2023    Latest Reference Range & Units 03/11/24 21:24 03/12/24 07:30  Glucose-Capillary 70 - 99 mg/dL 778 (H) 834 (H)  (H): Data is abnormally high  Diabetes history: DM2 Outpatient Diabetes medications:  70/30 30 units bid  Mounjaro  12.5 mg weekly Current orders for Inpatient glycemic control: Novolog  0-15 units tid, 0-5 units hs  Inpatient Diabetes Program Recommendations:   Spoke with patient via phone. Patient states her pharmacy did not have her 70/30 insulin  so she hasn't taken for 2 weeks. Patient has been on 75/25 in the past and also willing to change to basal/bolus. If changed to basal/bolus -Lantus  42 units daily -Novolog  6 units tid meal coverage  Thank you, Bruce Churilla E. [name], RN, MSN, CDCES  Diabetes Coordinator Inpatient Glycemic Control Team Team Pager [phone] (8am-5pm) 03/12/2024 10:26 AM

## 2024-03-12 NOTE — Plan of Care (Signed)
   Problem: Education: Goal: Knowledge of General Education information will improve Description Including pain rating scale, medication(s)/side effects and non-pharmacologic comfort measures Outcome: Progressing   Problem: Health Behavior/Discharge Planning: Goal: Ability to manage health-related needs will improve Outcome: Progressing

## 2024-03-12 NOTE — Progress Notes (Signed)
 PROGRESS NOTE    Amy Livingston  FMW:[id number] DOB: [birth year] DOA: 03/11/2024 PCP: Zarwolo, Gloria, FNP   Brief Narrative:    Amy Livingston is a 68 y.o. female with medical history significant for diabetes mellitus, hypertension, paroxysmal atrial fibrillation, anxiety and depression. Patient presented to the ED with complaints of weakness, pain with urination and urinary frequency.  Patient went to urgent care 7/29 with urinary symptoms, was diagnosed with UTI, she was discharged on Ceftin .  Urine cultures obtained grew E. coli and multidrug resistant Klebsiella.  Attempts were made to reach patient, but patient said she felt ill and was not checking her phone.  Patient has now been admitted with sepsis, POA secondary to multidrug-resistant E. coli and started on IV Merrem .  She is also noted to have AKI as well as diarrhea reminiscent of prior C. difficile infection.  She is now noted to have gram-negative bacteremia and ID consulted for further evaluation.  Assessment & Plan:   Principal Problem:   Sepsis due to gram-negative UTI (HCC) Active Problems:   Hypokalemia   Hyponatremia   Hypomagnesemia   Severe sepsis (HCC)   AKI (acute kidney injury) (HCC)   Diabetes 1.5, managed as type 2 (HCC)   PAF (paroxysmal atrial fibrillation) (HCC)   Generalized anxiety disorder   Essential hypertension  Assessment and Plan:   Sepsis due to gram-negative UTI (HCC) with bacteremia Complicated UTI.  Recent urine cultures from 7/29 grew sensitive E. coli and multidrug resistant Klebsiella-sensitive to imipenem and Zosyn . Meeting severe sepsis criteria with tachycardia heart rate 93-102, leukocytosis of 18, evidence of endorgan dysfunction, lactic acid 2.7 >> 1.4 and AKI.  -Pharmacy consulted-recommended meropenem  over Zosyn  -1.7L sepsis fluid bolus given, cont N/s + 40kcl 100cc/hr x 15hrs - Gram-negative rods noted in blood cultures with ID consultation pending.  AKI (acute kidney injury)  (HCC) Creatinine 1.17, baseline 0.6.  In the setting of severe sepsis secondary to UTI, diarrhea. - hydrate  Hypomagnesemia Magnesium  1.5.  Hyponatremia-improving Na- 131. - hydrate  Hypokalemia Repleted, continue to monitor  Essential hypertension Stable.  Generalized anxiety disorder Pending med reconciliation - resume buspirone , Zoloft   PAF (paroxysmal atrial fibrillation) (HCC) Heart rate 93-104. - Resume Eliquis    Diabetes 1.5, managed as type 2 (HCC) - HGbA1c - SSI- M -Supposed to be on NPH 30 units BID-ran out a week ago, appreciate diabetes coordinator recommendations with initiation of Semglee  as well as mealtime insulin .   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  Consultants:  None  Procedures:  None  Antimicrobials:  Anti-infectives (From admission, onward)    Start     Dose/Rate Route Frequency Ordered Stop   03/11/24 1800  meropenem  (MERREM ) 1 g in sodium chloride  0.9 % 100 mL IVPB        1 g 200 mL/hr over 30 Minutes Intravenous Every 8 hours 03/11/24 1628     03/11/24 1430  piperacillin -tazobactam (ZOSYN ) IVPB 3.375 g        3.375 g 100 mL/hr over 30 Minutes Intravenous  Once 03/11/24 1417 03/11/24 1530       Subjective: Patient seen and evaluated today with no new acute complaints or concerns. No acute concerns or events noted overnight.  She states that she has been having quite a bit of weakness at home and trouble ambulating despite use of her cane and walker.  Objective: Vitals:   03/11/24 1701 03/11/24 2124 03/12/24 0038 03/12/24 0504  BP: 133/64 (!) 116/51 (!) 119/57 125/63  Pulse: (!) 104  (!) 110 96  Resp: 20 17 17 18   Temp: 99 F (37.2 C) 98.5 F (36.9 C) 98.4 F (36.9 C) 98.9 F (37.2 C)  TempSrc: Oral     SpO2: 97% 96% 93% 94%  Weight: 94.2  kg     Height: 5' 6 (1.676 m)       Intake/Output Summary (Last 24 hours) at 03/12/2024 1228 Last data filed at 03/12/2024 0720 Gross per 24 hour  Intake 2938.76 ml  Output 1300 ml  Net 1638.76 ml   Filed Weights   03/11/24 1152 03/11/24 1701  Weight: 92.1 kg 94.2 kg    Examination:  General exam: Appears calm and comfortable  Respiratory system: Clear to auscultation. Respiratory effort normal. Cardiovascular system: S1 & S2 heard, RRR.  Gastrointestinal system: Abdomen is soft Central nervous system: Alert and awake Extremities: No edema Skin: No significant lesions noted Psychiatry: Flat affect.    Data Reviewed: I have personally reviewed following labs and imaging studies  CBC: Recent Labs  Lab 03/11/24 1449 03/12/24 0310 03/12/24 0946  WBC 18.0* 15.9*  --   NEUTROABS 14.3*  --   --   HGB 8.4* 7.6* 8.1*  HCT 27.5* 24.6* 25.6*  MCV 86.5 85.4  --   PLT 312 312  --    Basic Metabolic Panel: Recent Labs  Lab 03/11/24 1449 03/11/24 1630 03/12/24 0310  NA 131*  --  134*  K 2.7*  --  3.7  CL 95*  --  102  CO2 24  --  24  GLUCOSE 204*  --  181*  BUN 15  --  15  CREATININE 1.17*  --  1.03*  CALCIUM  8.6*  --  8.1*  MG  --  1.5*  --    GFR: Estimated Creatinine Clearance: 61.3 mL/min (A) (by C-G formula based on SCr of 1.03 mg/dL (H)). Liver Function Tests: Recent Labs  Lab 03/11/24 1449  AST 18  ALT 11  ALKPHOS 147*  BILITOT 0.6  PROT 5.6*  ALBUMIN 2.2*   No results for input(s): LIPASE, AMYLASE in the last 168 hours. No results for input(s): AMMONIA in the last 168 hours. Coagulation Profile: No results for input(s): INR, PROTIME in the last 168 hours. Cardiac Enzymes: No results for input(s): CKTOTAL, CKMB, CKMBINDEX, TROPONINI in the last 168 hours. BNP (last 3 results) No results for input(s): PROBNP in the last 8760 hours. HbA1C: No results for input(s): HGBA1C in the last 72 hours. CBG: Recent Labs  Lab  03/11/24 2124 03/12/24 0730 03/12/24 1114  GLUCAP 221* 165* 296*   Lipid Profile: No results for input(s): CHOL, HDL, LDLCALC, TRIG, CHOLHDL, LDLDIRECT in the last 72 hours. Thyroid  Function Tests: No results for input(s): TSH, T4TOTAL, FREET4, T3FREE, THYROIDAB in the last 72 hours. Anemia Panel: No results for input(s): VITAMINB12, FOLATE, FERRITIN, TIBC, IRON, RETICCTPCT in the last 72 hours. Sepsis Labs: Recent Labs  Lab 03/11/24 1449 03/11/24 1630  LATICACIDVEN 2.7* 1.4    Recent Results (from the past 240 hours)  Urine Culture     Status: Abnormal   Collection Time: 03/06/24  2:00 PM   Specimen: Urine, Clean Catch  Result Value Ref Range Status   Specimen Description   Final    URINE, CLEAN CATCH Performed at [facility], [address] [place], [place] [address]    Special Requests  Final    NONE Performed at [facility], [address] [place], [place] [address]    Culture (A)  Final    50,000 COLONIES/mL ESCHERICHIA COLI >=100,000 COLONIES/mL KLEBSIELLA PNEUMONIAE Confirmed Extended Spectrum Beta-Lactamase Producer (ESBL).  In bloodstream infections from ESBL organisms, carbapenems are preferred over piperacillin /tazobactam. They are shown to have a lower risk of mortality.    Report Status 03/09/2024 FINAL  Final   Organism ID, Bacteria ESCHERICHIA COLI (A)  Final   Organism ID, Bacteria KLEBSIELLA PNEUMONIAE (A)  Final      Susceptibility   Escherichia coli - MIC*    AMPICILLIN 4 SENSITIVE Sensitive     CEFAZOLIN  <=4 SENSITIVE Sensitive     CEFEPIME  <=0.12 SENSITIVE Sensitive     CEFTRIAXONE  <=0.25 SENSITIVE Sensitive     CIPROFLOXACIN >=4 RESISTANT Resistant     GENTAMICIN <=1 SENSITIVE Sensitive     IMIPENEM <=0.25 SENSITIVE Sensitive     NITROFURANTOIN  <=16 SENSITIVE Sensitive     TRIMETH/SULFA <=20 SENSITIVE Sensitive     AMPICILLIN/SULBACTAM <=2 SENSITIVE Sensitive     PIP/TAZO <=4 SENSITIVE Sensitive ug/mL     * 50,000 COLONIES/mL ESCHERICHIA COLI   Klebsiella pneumoniae - MIC*    AMPICILLIN >=32 RESISTANT Resistant     CEFAZOLIN  >=64 RESISTANT Resistant     CEFEPIME  >=32 RESISTANT Resistant     CEFTRIAXONE  >=64 RESISTANT Resistant     CIPROFLOXACIN 1 RESISTANT Resistant     GENTAMICIN >=16 RESISTANT Resistant     IMIPENEM <=0.25 SENSITIVE Sensitive     NITROFURANTOIN  128 RESISTANT Resistant     TRIMETH/SULFA >=320 RESISTANT Resistant     AMPICILLIN/SULBACTAM >=32 RESISTANT Resistant     PIP/TAZO <=4 SENSITIVE Sensitive ug/mL    * >=100,000 COLONIES/mL KLEBSIELLA PNEUMONIAE  Blood culture (routine x 2)     Status: None (Preliminary result)   Collection Time: 03/11/24  2:49 PM   Specimen: Right Antecubital; Blood  Result Value Ref Range Status   Specimen Description   Final    RIGHT ANTECUBITAL BOTTLES DRAWN AEROBIC AND ANAEROBIC   Special Requests Blood Culture adequate volume  Final   Culture  Setup Time   Final    GRAM NEGATIVE RODS IN BOTH AEROBIC AND ANAEROBIC BOTTLES Gram Stain Report Called to,Read Back By and Verified With: L FLORES AT 0826 ON [id number] BY S DALTON Performed at [facility], [address], [place], [place] [address]    Culture GRAM NEGATIVE RODS  Final   Report Status PENDING  Incomplete  Blood culture (routine x 2)     Status: None (Preliminary result)   Collection Time: 03/11/24  2:49 PM   Specimen: BLOOD LEFT FOREARM  Result Value Ref Range Status   Specimen Description   Final    BLOOD LEFT FOREARM BOTTLES DRAWN AEROBIC AND ANAEROBIC   Special Requests Blood Culture adequate volume  Final   Culture  Setup Time   Final    GRAM NEGATIVE RODS ANAEROBIC BOTTLE ONLY Gram Stain Report Called to,Read Back By and Verified With: L FLORES AT 0826 ON [id number] BY S DALTON Performed at [facility], [address], [place], [place] [address]    Culture GRAM NEGATIVE RODS  Final   Report Status PENDING  Incomplete         Radiology Studies: No results  found.      Scheduled Meds:  apixaban   5 mg Oral BID   insulin  aspart  0-15 Units Subcutaneous TID WC   insulin  aspart  0-5  Units Subcutaneous QHS   insulin  aspart  6 Units Subcutaneous TID WC   insulin  glargine-yfgn  42 Units Subcutaneous Daily   Continuous Infusions:  meropenem  (MERREM ) IV 1 g (03/12/24 0821)     LOS: 1 day    Time spent: 55 minutes    Kamorah Nevils D Maree, DO [facility]  If 7PM-7AM, please contact night-coverage [email or web address] 03/12/2024, 12:28 PM

## 2024-03-12 NOTE — Progress Notes (Signed)
 TOC received consult stating pt's insulin  is not in stock at pharmacy. LCSW discussed with pt and suggested trying a different pharmacy or discussing with PCP about changing to a different insulin . Pt indicates she has appointment with PCP today so she will have to call office to reschedule anyway and will discuss with them.    03/12/24 0758  TOC Brief Assessment  Insurance and Status Reviewed  Patient has primary care physician Yes  Home environment has been reviewed Lives with daughter.  Prior level of function: Fairly independent.  Prior/Current Home Services No current home services  Social Drivers of Health Review SDOH reviewed no interventions necessary  Readmission risk has been reviewed Yes  Transition of care needs no transition of care needs at this time

## 2024-03-12 NOTE — Evaluation (Addendum)
 Occupational Therapy Evaluation Patient Details Name: Amy Livingston MRN: [id number] DOB: [birth year] Today's Date: 03/12/2024   History of Present Illness   Amy Livingston is a 68 y.o. female with medical history significant for diabetes mellitus, hypertension, paroxysmal atrial fibrillation, anxiety and depression.  Patient presented to the ED with complaints of weakness, pain with urination and urinary frequency.  Patient went to urgent care 7/29 with urinary symptoms, was diagnosed with UTI, she was discharged on Ceftin .  Urine cultures obtained grew E. coli and multidrug resistant Klebsiella.  Attempts were made to reach patient, but patient said she felt ill and was not checking her phone.  She reports a fever of 103 at home.  Reports onset of watery stools over the past 2 days, about 3 times a day.  No vomiting.  No abdominal pain.  She says her symptoms reminded of her previous episode of C. difficile infection.    Patient reports for the past week she has intermittently been seeing black spots or cartoon characters, weird things that are not actually.  She is not having this currently. No confused speech or alteration in consciousness. (per MD)     Clinical Impressions Pt agreeable to OT and PT co-evaluation. Pt is independent at baseline for ADL's and uses RW or cane for ambulation at home. Pt required CGA to min A for sit to stand today and CGA for ambulation. Pt demonstrates WFL B UE functional use and strength. Able to complete seated ADL's with set up assist due to need for assist while standing and ambulating. Pt also limited to short distance ambulation in the room today. Pt left in the chair with call bell within reach and chair alarm set. Pt will benefit from continued OT in the hospital and recommended venue below to increase strength, balance, and endurance for safe ADL's.        If plan is discharge home, recommend the following:   A little help with walking and/or transfers;A  little help with bathing/dressing/bathroom;Assistance with cooking/housework;Assist for transportation;Help with stairs or ramp for entrance     Functional Status Assessment   Patient has had a recent decline in their functional status and demonstrates the ability to make significant improvements in function in a reasonable and predictable amount of time.     Equipment Recommendations   BSC/3in1            Precautions/Restrictions   Precautions Precautions: Fall Recall of Precautions/Restrictions: Intact Restrictions Weight Bearing Restrictions Per Provider Order: No     Mobility Bed Mobility Overal bed mobility: Independent             General bed mobility comments: mild labored movement    Transfers Overall transfer level: Needs assistance Equipment used: Rolling walker (2 wheels) Transfers: Sit to/from Stand, Bed to chair/wheelchair/BSC Sit to Stand: Contact guard assist, Min assist     Step pivot transfers: Contact guard assist     General transfer comment: CGA for all sit to stands other than the toilet which required min A to boost; pt has a higher seat at home. CGA for step pivot to bed from chair with RW.      Balance Overall balance assessment: Needs assistance Sitting-balance support: No upper extremity supported, Feet supported Sitting balance-Leahy Scale: Good Sitting balance - Comments: at EOB   Standing balance support: Bilateral upper extremity supported, During functional activity, Reliant on assistive device for balance Standing balance-Leahy Scale: Fair Standing balance comment: fair to good with RW  ADL either performed or assessed with clinical judgement   ADL Overall ADL's : Needs assistance/impaired     Grooming: Contact guard assist;Standing;Supervision/safety   Upper Body Bathing: Set up;Sitting   Lower Body Bathing: Set up;Sitting/lateral leans   Upper Body Dressing : Set  up;Sitting   Lower Body Dressing: Set up;Sitting/lateral leans Lower Body Dressing Details (indicate cue type and reason): Able to doff and don sock without physical assist. Toilet Transfer: Contact guard assist;Minimal assistance;Ambulation;Rolling walker (2 wheels) Toilet Transfer Details (indicate cue type and reason): EOB to toilet and back. Toileting- Clothing Manipulation and Hygiene: Supervision/safety;Sitting/lateral lean;Set up       Functional mobility during ADLs: Contact guard assist;Rolling walker (2 wheels);Supervision/safety General ADL Comments: Able to ambualte in room with RW     Vision Baseline Vision/History: 1 Wears glasses;4 Cataracts (bleeding behind eyes) Ability to See in Adequate Light: 2 Moderately impaired Patient Visual Report: No change from baseline Vision Assessment?:  (baseline deficits)     Perception Perception: Not tested       Praxis Praxis: Not tested       Pertinent Vitals/Pain Pain Assessment Pain Assessment: 0-10 Pain Score: 10-Worst pain ever Pain Location: urinary tract Pain Descriptors / Indicators: Pressure Pain Intervention(s): Limited activity within patient's tolerance, Monitored during session, Repositioned     Extremity/Trunk Assessment Upper Extremity Assessment Upper Extremity Assessment: Overall WFL for tasks assessed   Lower Extremity Assessment Lower Extremity Assessment: Defer to PT evaluation   Cervical / Trunk Assessment Cervical / Trunk Assessment: Normal   Communication Communication Communication: No apparent difficulties   Cognition Arousal: Alert Behavior During Therapy: WFL for tasks assessed/performed Cognition: No apparent impairments                               Following commands: Intact       Cueing  General Comments   Cueing Techniques: Verbal cues                 Home Living Family/patient expects to be discharged to:: Private residence Living Arrangements:  Children Available Help at Discharge: Family;Available 24 hours/day Type of Home: Apartment Home Access: Stairs to enter Entrance Stairs-Number of Steps: 3-4   Home Layout: Two level;Able to live on main level with bedroom/bathroom     Bathroom Shower/Tub: Tub/shower unit   Bathroom Toilet: Handicapped height (toilet riser) Bathroom Accessibility: Yes How Accessible: Accessible via walker Home Equipment: Rolling Walker (2 wheels);Cane - single point;Toilet riser;Shower seat;Hospital bed;Wheelchair - manual          Prior Functioning/Environment Prior Level of Function : Needs assist;History of Falls (last six months) (3-4 falls in past 6 months)       Physical Assist : ADLs (physical)   ADLs (physical): IADLs Mobility Comments: Can ambulate in the community with RW or SPC. Drives. ADLs Comments: Assist IADL's; independent ADL's.    OT Problem List: Decreased strength;Decreased activity tolerance;Impaired balance (sitting and/or standing)   OT Treatment/Interventions: Self-care/ADL training;Therapeutic exercise;DME and/or AE instruction;Therapeutic activities;Patient/family education;Balance training      OT Goals(Current goals can be found in the care plan section)   Acute Rehab OT Goals Patient Stated Goal: return home OT Goal Formulation: With patient Time For Goal Achievement: 03/26/24 Potential to Achieve Goals: Good   OT Frequency:  Min 1X/week    Co-evaluation PT/OT/SLP Co-Evaluation/Treatment: Yes Reason for Co-Treatment: To address functional/ADL transfers   OT goals addressed during session: ADL's and self-care  End of Session Equipment Utilized During Treatment: Gait belt;Rolling walker (2 wheels)  Activity Tolerance: Patient tolerated treatment well Patient left: in chair;with call bell/phone within reach  OT Visit Diagnosis: Unsteadiness on feet (R26.81);Other abnormalities of gait and mobility (R26.89);Muscle  weakness (generalized) (M62.81)                Time: 8948-8890 OT Time Calculation (min): 18 min Charges:  OT General Charges $OT Visit: 1 Visit OT Evaluation $OT Eval Low Complexity: 1 Low  Amarri Satterly OT, MOT   Jayson Person 03/12/2024, 11:33 AM

## 2024-03-12 NOTE — Progress Notes (Signed)
 Patients post void residual from bladder scan is 564

## 2024-03-12 NOTE — Progress Notes (Signed)
 PHARMACY - PHYSICIAN COMMUNICATION CRITICAL VALUE ALERT - BLOOD CULTURE IDENTIFICATION (BCID)  Amy Livingston is an 68 y.o. female who presented to [facility] on 03/11/2024   Assessment:  ESBL klebsiella  Name of physician (or Provider) Contacted: Dr. Maree  Current antibiotics: Meropenem    Changes to prescribed antibiotics recommended:  Patient is on recommended antibiotics - No changes needed  Results for orders placed or performed during the hospital encounter of 03/11/24  Blood Culture ID Panel (Reflexed) (Collected: 03/11/2024  2:49 PM)  Result Value Ref Range   Enterococcus faecalis NOT DETECTED NOT DETECTED   Enterococcus Faecium NOT DETECTED NOT DETECTED   Listeria monocytogenes NOT DETECTED NOT DETECTED   Staphylococcus species NOT DETECTED NOT DETECTED   Staphylococcus aureus (BCID) NOT DETECTED NOT DETECTED   Staphylococcus epidermidis NOT DETECTED NOT DETECTED   Staphylococcus lugdunensis NOT DETECTED NOT DETECTED   Streptococcus species NOT DETECTED NOT DETECTED   Streptococcus agalactiae NOT DETECTED NOT DETECTED   Streptococcus pneumoniae NOT DETECTED NOT DETECTED   Streptococcus pyogenes NOT DETECTED NOT DETECTED   A.calcoaceticus-baumannii NOT DETECTED NOT DETECTED   Bacteroides fragilis NOT DETECTED NOT DETECTED   Enterobacterales DETECTED (A) NOT DETECTED   Enterobacter cloacae complex NOT DETECTED NOT DETECTED   Escherichia coli NOT DETECTED NOT DETECTED   Klebsiella aerogenes NOT DETECTED NOT DETECTED   Klebsiella oxytoca NOT DETECTED NOT DETECTED   Klebsiella pneumoniae DETECTED (A) NOT DETECTED   Proteus species NOT DETECTED NOT DETECTED   Salmonella species NOT DETECTED NOT DETECTED   Serratia marcescens NOT DETECTED NOT DETECTED   Haemophilus influenzae NOT DETECTED NOT DETECTED   Neisseria meningitidis NOT DETECTED NOT DETECTED   Pseudomonas aeruginosa NOT DETECTED NOT DETECTED   Stenotrophomonas maltophilia NOT DETECTED NOT DETECTED   Candida albicans  NOT DETECTED NOT DETECTED   Candida auris NOT DETECTED NOT DETECTED   Candida glabrata NOT DETECTED NOT DETECTED   Candida krusei NOT DETECTED NOT DETECTED   Candida parapsilosis NOT DETECTED NOT DETECTED   Candida tropicalis NOT DETECTED NOT DETECTED   Cryptococcus neoformans/gattii NOT DETECTED NOT DETECTED   CTX-M ESBL DETECTED (A) NOT DETECTED   Carbapenem resistance IMP NOT DETECTED NOT DETECTED   Carbapenem resistance KPC NOT DETECTED NOT DETECTED   Carbapenem resistance NDM NOT DETECTED NOT DETECTED   Carbapenem resist OXA 48 LIKE NOT DETECTED NOT DETECTED   Carbapenem resistance VIM NOT DETECTED NOT DETECTED    Elspeth JAYSON Sour 03/12/2024  2:23 PM

## 2024-03-12 NOTE — Evaluation (Signed)
 Physical Therapy Evaluation Patient Details Name: Amy Livingston MRN: [id number] DOB: 06/08/1956 Today's Date: 03/12/2024  History of Present Illness  Amy Livingston is a 68 y.o. female with medical history significant for diabetes mellitus, hypertension, paroxysmal atrial fibrillation, anxiety and depression.  Patient presented to the ED with complaints of weakness, pain with urination and urinary frequency.  Patient went to urgent care 7/29 with urinary symptoms, was diagnosed with UTI, she was discharged on Ceftin .  Urine cultures obtained grew E. coli and multidrug resistant Klebsiella.  Attempts were made to reach patient, but patient said she felt ill and was not checking her phone.  She reports a fever of 103 at home.  Reports onset of watery stools over the past 2 days, about 3 times a day.  No vomiting.  No abdominal pain.  She says her symptoms reminded of her previous episode of C. difficile infection.    Patient reports for the past week she has intermittently been seeing black spots or cartoon characters, weird things that are not actually.  She is not having this currently. No confused speech or alteration in consciousness.   Clinical Impression  Patient demonstrates good return for bed mobility, transferring to/from bed to chair, but had difficulty completing sit to stands from commode in bathroom due to BLE weakness and limited to ambulating in room only due to c/o fatigue. Patient tolerated sitting up in chair after therapy. Patient will benefit from continued skilled physical therapy in hospital and recommended venue below to increase strength, balance, endurance for safe ADLs and gait.          If plan is discharge home, recommend the following: A little help with walking and/or transfers;A little help with bathing/dressing/bathroom;Help with stairs or ramp for entrance;Assistance with cooking/housework   Can travel by private vehicle        Equipment Recommendations None  recommended by PT  Recommendations for Other Services       Functional Status Assessment Patient has had a recent decline in their functional status and demonstrates the ability to make significant improvements in function in a reasonable and predictable amount of time.     Precautions / Restrictions Precautions Precautions: Fall Recall of Precautions/Restrictions: Intact Restrictions Weight Bearing Restrictions Per Provider Order: No      Mobility  Bed Mobility Overal bed mobility: Independent                  Transfers Overall transfer level: Needs assistance Equipment used: Rolling walker (2 wheels)   Sit to Stand: Contact guard assist, Min assist   Step pivot transfers: Contact guard assist       General transfer comment: had difficulty completing sit to stands from commode in bathroom due to BLE weakness    Ambulation/Gait Ambulation/Gait assistance: Contact guard assist Gait Distance (Feet): 15 Feet Assistive device: Rolling walker (2 wheels) Gait Pattern/deviations: Decreased step length - right, Decreased step length - left, Decreased stride length Gait velocity: decreased     General Gait Details: labored movement with fair/good return for ambulating in room using RW without loss of balance, limited mostly due to c/o fatigue  Stairs            Wheelchair Mobility     Tilt Bed    Modified Rankin (Stroke Patients Only)       Balance Overall balance assessment: Needs assistance Sitting-balance support: Feet supported, No upper extremity supported Sitting balance-Leahy Scale: Good Sitting balance - Comments: at EOB   Standing balance  support: Bilateral upper extremity supported, During functional activity, Reliant on assistive device for balance Standing balance-Leahy Scale: Fair Standing balance comment: fair to good with RW                             Pertinent Vitals/Pain Pain Assessment Pain Assessment: 0-10 Pain  Location: urinary tract Pain Descriptors / Indicators: Pressure Pain Intervention(s): Limited activity within patient's tolerance, Monitored during session, Repositioned    Home Living Family/patient expects to be discharged to:: Private residence Living Arrangements: Children Available Help at Discharge: Family;Available 24 hours/day Type of Home: Apartment Home Access: Stairs to enter Entrance Stairs-Rails: Right Entrance Stairs-Number of Steps: 3-4 Alternate Level Stairs-Number of Steps: 13 Home Layout: Two level;Able to live on main level with bedroom/bathroom Home Equipment: Rolling Walker (2 wheels);Cane - single point;Toilet riser;Shower seat;Hospital bed;Wheelchair - manual      Prior Function Prior Level of Function : Needs assist;History of Falls (last six months)       Physical Assist : ADLs (physical);Mobility (physical) Mobility (physical): Bed mobility;Transfers;Gait;Stairs ADLs (physical): IADLs Mobility Comments: Can ambulate in the community with RW or SPC. Drives. ADLs Comments: Assist IADL's; independent ADL's.     Extremity/Trunk Assessment   Upper Extremity Assessment Upper Extremity Assessment: Defer to OT evaluation    Lower Extremity Assessment Lower Extremity Assessment: Generalized weakness    Cervical / Trunk Assessment Cervical / Trunk Assessment: Normal  Communication   Communication Communication: No apparent difficulties    Cognition Arousal: Alert Behavior During Therapy: WFL for tasks assessed/performed   PT - Cognitive impairments: No apparent impairments                         Following commands: Intact       Cueing Cueing Techniques: Verbal cues     General Comments      Exercises     Assessment/Plan    PT Assessment Patient needs continued PT services  PT Problem List Decreased strength;Decreased activity tolerance;Decreased balance;Decreased mobility       PT Treatment Interventions DME  instruction;Gait training;Stair training;Functional mobility training;Therapeutic activities;Therapeutic exercise;Balance training;Patient/family education    PT Goals (Current goals can be found in the Care Plan section)  Acute Rehab PT Goals Patient Stated Goal: return home with family to assist PT Goal Formulation: With patient Time For Goal Achievement: 03/16/24 Potential to Achieve Goals: Good    Frequency Min 3X/week     Co-evaluation PT/OT/SLP Co-Evaluation/Treatment: Yes Reason for Co-Treatment: To address functional/ADL transfers PT goals addressed during session: Mobility/safety with mobility;Balance;Proper use of DME OT goals addressed during session: ADL's and self-care       AM-PAC PT 6 Clicks Mobility  Outcome Measure Help needed turning from your back to your side while in a flat bed without using bedrails?: None Help needed moving from lying on your back to sitting on the side of a flat bed without using bedrails?: None Help needed moving to and from a bed to a chair (including a wheelchair)?: A Little Help needed standing up from a chair using your arms (e.g., wheelchair or bedside chair)?: A Little Help needed to walk in hospital room?: A Little Help needed climbing 3-5 steps with a railing? : A Lot 6 Click Score: 19    End of Session   Activity Tolerance: Patient tolerated treatment well;Patient limited by fatigue Patient left: in chair;with call bell/phone within reach Nurse Communication: Mobility status PT  Visit Diagnosis: Unsteadiness on feet (R26.81);Other abnormalities of gait and mobility (R26.89);Muscle weakness (generalized) (M62.81)    Time: 1055-1110 PT Time Calculation (min) (ACUTE ONLY): 15 min   Charges:   PT Evaluation $PT Eval Low Complexity: 1 Low PT Treatments $Therapeutic Activity: 8-22 mins PT General Charges $$ ACUTE PT VISIT: 1 Visit         1:53 PM, 03/12/24 Lynwood Music, MPT Physical Therapist with [facility] [phone] office [phone] mobile phone

## 2024-03-12 NOTE — Consult Note (Signed)
 NAME: Amy Livingston  DOB: 1956-02-10  MRN: [id number]  Date/Time: 03/12/2024 1:20 PM  REQUESTING PROVIDER: Dr.Shah Subjective:  REASON FOR CONSULT: ESBL klebsiella UTI/bacteremia ? Pt is in [facility] This is a tele consult I spoke to the patient on the phone Amy Livingston is a 68 y.o. female with a history of DM, HTN, PAF on AC,  staph lugdunensis bacteremia in March 2025, neg TEE , h/o c5-C6 decompression/fusion  treated with 2 weeks IV, Cdiff diarrhea, ,recurrent UTI, urinary incontinence presented to [facility] on 03/10/24  with weakness, dysuria, urinary frequency and worsening urinary incontinence for the past week or so- [facility] and was prescribed cefuroxime . Urine culture grew ESBL klebsiella and ecoli. [facility] tried to reach her. She had a a fever of 103 at home Vitals in the ED  03/11/24 11:54  BP 116/57 !  Temp 98.6 F (37 C)  Pulse Rate 102 !  Resp 16  SpO2 94 %   Data is abnormal   Latest Reference Range & Units 03/11/24 14:49  WBC 4.0 - 10.5 K/uL 18.0 (H)  Hemoglobin 12.0 - 15.0 g/dL 8.4 (L)  HCT 63.9 - 53.9 % 27.5 (L)  Platelets 150 - 400 K/uL 312  Creatinine 0.44 - 1.00 mg/dL 8.82 (H)  Data is abnormally high  Data is abnormally low  Recent antibiotic use 6/25 Keflex  7/29 cefuroxime    Past Medical History:  Diagnosis Date   Anxiety    Atrial fibrillation (HCC)    Diabetes mellitus without complication (HCC)    Dysrhythmia    GERD (gastroesophageal reflux disease)    Hypertension    PONV (postoperative nausea and vomiting)    Shortness of breath dyspnea     Past Surgical History:  Procedure Laterality Date   ABDOMINAL HYSTERECTOMY     ANTERIOR CERVICAL DECOMP/DISCECTOMY FUSION N/A 01/13/2016   Procedure: C5-6 ANTERIOR CERVICAL DECOMPRESSION/DISKECTOMY/FUSION;  Surgeon: Victory Gens, MD;  Location: [facility];  Service: Neurosurgery;  Laterality: N/A;  C5-6 ANTERIOR CERVICAL DECOMPRESSION/DISKECTOMY/FUSION   BACK SURGERY     x 5   BIOPSY   07/25/2023   Procedure: BIOPSY;  Surgeon: Cindie Carlin POUR, DO;  Location: [facility];  Service: Endoscopy;;   CHOLECYSTECTOMY     COLONOSCOPY WITH PROPOFOL  N/A 07/25/2023   Procedure: COLONOSCOPY WITH PROPOFOL ;  Surgeon: Cindie Carlin POUR, DO;  Location: [facility];  Service: Endoscopy;  Laterality: N/A;  10:45 am, asa 3   ESOPHAGEAL BRUSHING  07/25/2023   Procedure: ESOPHAGEAL BRUSHING;  Surgeon: Cindie Carlin POUR, DO;  [facility]: [facility];  Service: Endoscopy;;   ESOPHAGOGASTRODUODENOSCOPY (EGD) WITH PROPOFOL  N/A 07/25/2023   Procedure: ESOPHAGOGASTRODUODENOSCOPY (EGD) WITH PROPOFOL ;  Surgeon: Cindie Carlin POUR, DO;  [facility]: [facility];  Service: Endoscopy;  Laterality: N/A;   FOOT SURGERY     KNEE ARTHROSCOPY     left   KNEE SURGERY Right    POLYPECTOMY  07/25/2023   Procedure: POLYPECTOMY INTESTINAL;  Surgeon: Cindie Carlin POUR, DO;  [facility]: [facility];  Service: Endoscopy;;   TEE WITHOUT CARDIOVERSION N/A 10/13/2023   Procedure: ECHOCARDIOGRAM, TRANSESOPHAGEAL;  Surgeon: Stacia Diannah SQUIBB, MD;  Location: [facility];  Service: Cardiovascular;  Laterality: N/A;   TUBAL LIGATION      Social History   Socioeconomic History   Marital status: Divorced    Spouse name: Not on file   Number of children: Not on file   Years of education: Not on file   Highest education level: Some college,  no degree  Occupational History   Not on file  Tobacco Use   Smoking status: Never   Smokeless tobacco: Never  Substance and Sexual Activity   Alcohol use: No   Drug use: No   Sexual activity: Not on file  Other Topics Concern   Not on file  Social History Narrative   Not on file   Social Drivers of Health   Financial Resource Strain: Low Risk  (10/12/2023)   Overall Financial Resource Strain (CARDIA)    Difficulty of Paying Living Expenses: Not very hard  Food Insecurity: No Food Insecurity (03/11/2024)   Hunger Vital Sign    Worried About Running Out of Food in the Last  Year: Never true    Ran Out of Food in the Last Year: Never true  Transportation Needs: No Transportation Needs (03/11/2024)   PRAPARE - Administrator, Civil Service (Medical): No    Lack of Transportation (Non-Medical): No  Physical Activity: Inactive (10/12/2023)   Exercise Vital Sign    Days of Exercise per Week: 0 days    Minutes of Exercise per Session: 0 min  Stress: Stress Concern Present (10/12/2023)   [employer] of Occupational Health - Occupational Stress Questionnaire    Feeling of Stress : Rather much  Social Connections: Socially Isolated (03/11/2024)   Social Connection and Isolation Panel    Frequency of Communication with Friends and Family: More than three times a week    Frequency of Social Gatherings with Friends and Family: Once a week    Attends Religious Services: Never    Database administrator or Organizations: No    Attends Banker Meetings: Never    Marital Status: Divorced  Catering manager Violence: Not At Risk (03/11/2024)   Humiliation, Afraid, Rape, and Kick questionnaire    Fear of Current or Ex-Partner: No    Emotionally Abused: No    Physically Abused: No    Sexually Abused: No    History reviewed. No pertinent family history. Allergies  Allergen Reactions   Elemental Sulfur Itching and Rash    Yeast infections   Metformin  And Related Diarrhea    GI distress   I? Current Facility-Administered Medications  Medication Dose Route Frequency Provider Last Rate Last Admin   acetaminophen  (TYLENOL ) tablet 650 mg  650 mg Oral Q6H PRN Emokpae, Ejiroghene E, MD   650 mg at 03/12/24 1118   Or   acetaminophen  (TYLENOL ) suppository 650 mg  650 mg Rectal Q6H PRN Emokpae, Ejiroghene E, MD       apixaban  (ELIQUIS ) tablet 5 mg  5 mg Oral BID Emokpae, Ejiroghene E, MD   5 mg at 03/12/24 0820   atorvastatin  (LIPITOR) tablet 80 mg  80 mg Oral QHS Shah, Pratik D, DO       cyclobenzaprine  (FLEXERIL ) tablet 10 mg  10 mg Oral Q12H Shah,  Pratik D, DO   10 mg at 03/12/24 1307   gabapentin  (NEURONTIN ) capsule 300 mg  300 mg Oral TID Maree, Pratik D, DO       insulin  aspart (novoLOG ) injection 0-15 Units  0-15 Units Subcutaneous TID WC Emokpae, Ejiroghene E, MD   8 Units at 03/12/24 1118   insulin  aspart (novoLOG ) injection 0-5 Units  0-5 Units Subcutaneous QHS Emokpae, Ejiroghene E, MD   2 Units at 03/11/24 2233   insulin  aspart (novoLOG ) injection 6 Units  6 Units Subcutaneous TID WC Shah, Pratik D, DO       insulin   glargine-yfgn (SEMGLEE ) injection 42 Units  42 Units Subcutaneous Daily Maree Bracken D, DO   42 Units at 03/12/24 1307   meropenem  (MERREM ) 1 g in sodium chloride  0.9 % 100 mL IVPB  1 g Intravenous Q8H Lenon Elsie HERO, RPH 200 mL/hr at 03/12/24 9178 1 g at 03/12/24 9178   mirabegron  ER (MYRBETRIQ ) tablet 25 mg  25 mg Oral QHS Shah, Pratik D, DO       ondansetron  (ZOFRAN ) tablet 4 mg  4 mg Oral Q6H PRN Emokpae, Ejiroghene E, MD       Or   ondansetron  (ZOFRAN ) injection 4 mg  4 mg Intravenous Q6H PRN Emokpae, Ejiroghene E, MD       oxyCODONE -acetaminophen  (PERCOCET/ROXICET) 5-325 MG per tablet 1 tablet  1 tablet Oral Q8H PRN Maree, Pratik D, DO   1 tablet at 03/12/24 1307   And   oxyCODONE  (Oxy IR/ROXICODONE ) immediate release tablet 5 mg  5 mg Oral Q8H PRN Maree, Pratik D, DO   5 mg at 03/12/24 1307   pantoprazole  (PROTONIX ) EC tablet 40 mg  40 mg Oral Daily Maree, Pratik D, DO   40 mg at 03/12/24 1307   polyethylene glycol (MIRALAX  / GLYCOLAX ) packet 17 g  17 g Oral Daily PRN Emokpae, Ejiroghene E, MD       sertraline  (ZOLOFT ) tablet 50 mg  50 mg Oral Daily Maree, Pratik D, DO   50 mg at 03/12/24 1307   vancomycin  (VANCOCIN ) capsule 125 mg  125 mg Oral Q12H Fayette Bodily, MD   125 mg at 03/12/24 1307     Abtx:  Anti-infectives (From admission, onward)    Start     Dose/Rate Route Frequency Ordered Stop   03/12/24 1345  vancomycin  (VANCOCIN ) capsule 125 mg        125 mg Oral Every 12 hours 03/12/24 1255      03/11/24 1800  meropenem  (MERREM ) 1 g in sodium chloride  0.9 % 100 mL IVPB        1 g 200 mL/hr over 30 Minutes Intravenous Every 8 hours 03/11/24 1628     03/11/24 1430  piperacillin -tazobactam (ZOSYN ) IVPB 3.375 g        3.375 g 100 mL/hr over 30 Minutes Intravenous  Once 03/11/24 1417 03/11/24 1530       REVIEW OF SYSTEMS:  Const: fever, negative chills, weakness Eyes: negative diplopia or visual changes, negative eye pain ENT: negative coryza, negative sore throat Resp: negative cough, hemoptysis, dyspnea Cards: negative for chest pain, palpitations, lower extremity edema GU:  frequency, dysuria and no hematuria GI: Negative for abdominal pain, diarrhea, bleeding, constipation Skin: negative for rash and pruritus Heme: negative for easy bruising and gum/nose bleeding MS:  weakness Neurolo:negative for headaches, dizziness, vertigo, memory problems  Psych:  anxiety, depression  Endocrine:  diabetes Allergy/Immunology-sulfa. metformin  Objective:  VITALS:  BP (!) 127/59 (BP Location: Left Arm)   Pulse 89   Temp 98.6 F (37 C) (Oral)   Resp 18   Ht 5' 6 (1.676 m)   Wt 94.2 kg   SpO2 95%   BMI 33.52 kg/m   PHYSICAL EXAM:  NA as remote consult Pertinent Labs Lab Results CBC    Component Value Date/Time   WBC 15.9 (H) 03/12/2024 0310   RBC 2.88 (L) 03/12/2024 0310   HGB 8.1 (L) 03/12/2024 0946   HGB 13.7 06/01/2023 1612   HCT 25.6 (L) 03/12/2024 0946   HCT 43.6 06/01/2023 1612   PLT 312 03/12/2024 0310  PLT 193 06/01/2023 1612   MCV 85.4 03/12/2024 0310   MCV 97 06/01/2023 1612   MCH 26.4 03/12/2024 0310   MCHC 30.9 03/12/2024 0310   RDW 15.5 03/12/2024 0310   RDW 11.9 06/01/2023 1612   LYMPHSABS 2.2 03/11/2024 1449   LYMPHSABS 2.9 06/01/2023 1612   MONOABS 1.2 (H) 03/11/2024 1449   EOSABS 0.2 03/11/2024 1449   EOSABS 0.3 06/01/2023 1612   BASOSABS 0.0 03/11/2024 1449   BASOSABS 0.1 06/01/2023 1612       Latest Ref Rng & Units 03/12/2024    3:10  AM 03/11/2024    2:49 PM 11/06/2023    4:57 AM  CMP  Glucose 70 - 99 mg/dL 818  795  786   BUN 8 - 23 mg/dL 15  15  15    Creatinine 0.44 - 1.00 mg/dL 8.96  8.82  9.32   Sodium 135 - 145 mmol/L 134  131  138   Potassium 3.5 - 5.1 mmol/L 3.7  2.7  3.4   Chloride 98 - 111 mmol/L 102  95  104   CO2 22 - 32 mmol/L 24  24  26    Calcium  8.9 - 10.3 mg/dL 8.1  8.6  8.7   Total Protein 6.5 - 8.1 g/dL  5.6    Total Bilirubin 0.0 - 1.2 mg/dL  0.6    Alkaline Phos 38 - 126 U/L  147    AST 15 - 41 U/L  18    ALT 0 - 44 U/L  11        Microbiology: Recent Results (from the past 240 hours)  Urine Culture     Status: Abnormal   Collection Time: 03/06/24  2:00 PM   Specimen: Urine, Clean Catch  Result Value Ref Range Status   Specimen Description   Final    URINE, CLEAN CATCH Performed at [facility], [address] [place], [place] [address]    Special Requests   Final    NONE Performed at [facility], [address] [place], [place] [address]    Culture (A)  Final    50,000 COLONIES/mL ESCHERICHIA COLI >=100,000 COLONIES/mL KLEBSIELLA PNEUMONIAE Confirmed Extended Spectrum Beta-Lactamase Producer (ESBL).  In bloodstream infections from ESBL organisms, carbapenems are preferred over piperacillin /tazobactam. They are shown to have a lower risk of mortality.    Report Status 03/09/2024 FINAL  Final   Organism ID, Bacteria ESCHERICHIA COLI (A)  Final   Organism ID, Bacteria KLEBSIELLA PNEUMONIAE (A)  Final      Susceptibility   Escherichia coli - MIC*    AMPICILLIN 4 SENSITIVE Sensitive     CEFAZOLIN  <=4 SENSITIVE Sensitive     CEFEPIME  <=0.12 SENSITIVE Sensitive     CEFTRIAXONE  <=0.25 SENSITIVE Sensitive     CIPROFLOXACIN >=4 RESISTANT Resistant     GENTAMICIN <=1 SENSITIVE Sensitive     IMIPENEM <=0.25 SENSITIVE Sensitive     NITROFURANTOIN  <=16 SENSITIVE Sensitive     TRIMETH/SULFA <=20 SENSITIVE Sensitive     AMPICILLIN/SULBACTAM <=2 SENSITIVE Sensitive     PIP/TAZO <=4  SENSITIVE Sensitive ug/mL    * 50,000 COLONIES/mL ESCHERICHIA COLI   Klebsiella pneumoniae - MIC*    AMPICILLIN >=32 RESISTANT Resistant     CEFAZOLIN  >=64 RESISTANT Resistant     CEFEPIME  >=32 RESISTANT Resistant     CEFTRIAXONE  >=64 RESISTANT Resistant     CIPROFLOXACIN 1 RESISTANT Resistant     GENTAMICIN >=16 RESISTANT Resistant     IMIPENEM <=0.25 SENSITIVE Sensitive  NITROFURANTOIN  128 RESISTANT Resistant     TRIMETH/SULFA >=320 RESISTANT Resistant     AMPICILLIN/SULBACTAM >=32 RESISTANT Resistant     PIP/TAZO <=4 SENSITIVE Sensitive ug/mL    * >=100,000 COLONIES/mL KLEBSIELLA PNEUMONIAE  Blood culture (routine x 2)     Status: None (Preliminary result)   Collection Time: 03/11/24  2:49 PM   Specimen: Right Antecubital; Blood  Result Value Ref Range Status   Specimen Description   Final    RIGHT ANTECUBITAL BOTTLES DRAWN AEROBIC AND ANAEROBIC Performed at [facility], [address] [place], [place] [address]    Special Requests   Final    Blood Culture adequate volume Performed at [facility], [address], [place], [place] [address]    Culture  Setup Time   Final    GRAM NEGATIVE RODS IN BOTH AEROBIC AND ANAEROBIC BOTTLES Gram Stain Report Called to,Read Back By and Verified With: L FLORES AT 0826 ON [id number] BY S DALTON Organism ID to follow GRAM STAIN REVIEWED-AGREE WITH RESULT DRT Performed at [facility], [address], [place], [place] [address]    Culture GRAM NEGATIVE RODS  Final   Report Status PENDING  Incomplete  Blood culture (routine x 2)     Status: None (Preliminary result)   Collection Time: 03/11/24  2:49 PM   Specimen: BLOOD LEFT FOREARM  Result Value Ref Range Status   Specimen Description   Final    BLOOD LEFT FOREARM BOTTLES DRAWN AEROBIC AND ANAEROBIC Performed at [facility], [address] [place], [place] [address]    Special Requests   Final    Blood Culture adequate volume Performed at [facility], [address],  [place], [place] [address]    Culture  Setup Time   Final    GRAM NEGATIVE RODS ANAEROBIC BOTTLE ONLY Gram Stain Report Called to,Read Back By and Verified With: L FLORES AT 0826 ON [id number] BY S DALTON GRAM STAIN REVIEWED-AGREE WITH RESULT DRT Performed at [facility], [address], [place], [place] [address]    Culture GRAM NEGATIVE RODS  Final   Report Status PENDING  Incomplete    Lines and Device Date on insertion # of days DC  Central line     Foley     ETT       IMAGING RESULTS: No imaging available this admission I have personally reviewed the films ? Impression/Recommendation ESBL klebsiella bacteremia and UTI- complicated UTI Ecoli in utine culture as well Pt has recurrent UTI- she has urinary incontinence, frequnecy Need to rule out incomplete bladder emptying Pt is on Meropenem  She needs post void bladder scan Renal imaging to r/o hydronephrosis US  ordered but may need CT ( [facility] shown mild hydronephrosis Will need 10-14 days of antibiotic--  IV ertapenem  on discharge  Diarrhea- not any more  She had cdiff before Will prophylactically give vancomycin  125 mg BID while on antibiotics  ? P.S- Bladder scan showed + 500cc urine and she voided 700cc and repeat scan was 377 , I/O catheter 700cc Pt is already on Myrbetriq -? DC it  recommend urology/urogyn consult to find out the cause of the retention and incomplete emptying ?  Anemia  AKI improving Hypokalemia- resolved  DM currently on insulin  ( Was getting Mounjaro )   PAF on eliquis   C5-c6- fusion surgery     _This consult involved complex antimicrobial management and infection prevention with contact isolation _______________________________________________ Discussed with patient,Dr.Shah and her nurse

## 2024-03-12 NOTE — Progress Notes (Signed)
 The beneficiary is confined to a single room and will need 3N1.

## 2024-03-13 ENCOUNTER — Other Ambulatory Visit (HOSPITAL_COMMUNITY): Payer: Self-pay

## 2024-03-13 ENCOUNTER — Telehealth (HOSPITAL_COMMUNITY): Payer: Self-pay | Admitting: Pharmacy Technician

## 2024-03-13 DIAGNOSIS — A4150 Gram-negative sepsis, unspecified: Secondary | ICD-10-CM | POA: Diagnosis not present

## 2024-03-13 DIAGNOSIS — N390 Urinary tract infection, site not specified: Secondary | ICD-10-CM | POA: Diagnosis not present

## 2024-03-13 LAB — CBC
HCT: 25.4 % — ABNORMAL LOW (ref 36.0–46.0)
Hemoglobin: 7.8 g/dL — ABNORMAL LOW (ref 12.0–15.0)
MCH: 26.1 pg (ref 26.0–34.0)
MCHC: 30.7 g/dL (ref 30.0–36.0)
MCV: 84.9 fL (ref 80.0–100.0)
Platelets: 331 K/uL (ref 150–400)
RBC: 2.99 MIL/uL — ABNORMAL LOW (ref 3.87–5.11)
RDW: 15.7 % — ABNORMAL HIGH (ref 11.5–15.5)
WBC: 12.3 K/uL — ABNORMAL HIGH (ref 4.0–10.5)
nRBC: 0 % (ref 0.0–0.2)

## 2024-03-13 LAB — BASIC METABOLIC PANEL WITH GFR
Anion gap: 7 (ref 5–15)
BUN: 14 mg/dL (ref 8–23)
CO2: 26 mmol/L (ref 22–32)
Calcium: 8.4 mg/dL — ABNORMAL LOW (ref 8.9–10.3)
Chloride: 105 mmol/L (ref 98–111)
Creatinine, Ser: 0.85 mg/dL (ref 0.44–1.00)
GFR, Estimated: >60 mL/min (ref >60)
Glucose, Bld: 187 mg/dL — ABNORMAL HIGH (ref 70–99)
Potassium: 3.6 mmol/L (ref 3.5–5.1)
Sodium: 138 mmol/L (ref 135–145)

## 2024-03-13 LAB — HEMOGLOBIN A1C
Hgb A1c MFr Bld: 8.9 % — ABNORMAL HIGH (ref 4.8–5.6)
Mean Plasma Glucose: 209 mg/dL

## 2024-03-13 LAB — MAGNESIUM: Magnesium: 1.6 mg/dL — ABNORMAL LOW (ref 1.7–2.4)

## 2024-03-13 LAB — GLUCOSE, CAPILLARY
Glucose-Capillary: 176 mg/dL — ABNORMAL HIGH (ref 70–99)
Glucose-Capillary: 158 mg/dL — ABNORMAL HIGH (ref 70–99)
Glucose-Capillary: 96 mg/dL (ref 70–99)
Glucose-Capillary: 169 mg/dL — ABNORMAL HIGH (ref 70–99)

## 2024-03-13 MED ORDER — CHLORHEXIDINE GLUCONATE CLOTH 2 % EX PADS
6.0000 | MEDICATED_PAD | Freq: Every day | CUTANEOUS | Status: DC
  Administered 2024-03-13 – 2024-03-15 (×3): 6 via TOPICAL

## 2024-03-13 MED ORDER — SODIUM CHLORIDE 0.9% FLUSH
10.0000 mL | Freq: Two times a day (BID) | INTRAVENOUS | Status: DC
  Administered 2024-03-13 – 2024-03-14 (×3): 10 mL

## 2024-03-13 MED ORDER — SODIUM CHLORIDE 0.9% FLUSH
10.0000 mL | Freq: Two times a day (BID) | INTRAVENOUS | Status: DC
  Administered 2024-03-13 – 2024-03-14 (×2): 10 mL

## 2024-03-13 MED ORDER — SODIUM CHLORIDE 0.9% FLUSH: 10.0000 mL | INTRAVENOUS | Status: DC | PRN

## 2024-03-13 MED ORDER — ERTAPENEM IV (FOR PTA / DISCHARGE USE ONLY): 1.0000 g | INTRAVENOUS | 0 refills | Status: AC

## 2024-03-13 MED ORDER — MAGNESIUM SULFATE 2 GM/50ML IV SOLN
2.0000 g | Freq: Once | INTRAVENOUS | Status: AC
  Administered 2024-03-13: 2 g via INTRAVENOUS
  Filled 2024-03-13: qty 50

## 2024-03-13 NOTE — Treatment Plan (Signed)
 Diagnosis: ESBL klebsiella bacteremia Complicated UTI Urinary retention with overflow Baseline Creatinine <1    Allergies  Allergen Reactions   Elemental Sulfur Itching and Rash    Yeast infections   Metformin  And Related Diarrhea    GI distress    OPAT Orders Discharge antibiotics: Ertapenem  1 gram IV every 24 hours  Duration: 12 DAYS  End Date:03/23/24   MIDLINE /PIC Care Per Protocol:  Labs weekly while on IV antibiotics: _x_ CBC with differential  _x_ CMP   _x_ Please pull PIC at completion of IV antibiotics   Fax weekly lab results  promptly to [phone]  Clinic Follow Up Appt: 03/22/24 [facility]AM[facility] CHART [facility]ISIT   Call [phone] WITH ANY QUESTIONS OR CRITICAL VALUE

## 2024-03-13 NOTE — TOC Progression Note (Signed)
 Transition of Care [facility]) - Progression Note    Patient Details  Name: Amy Livingston MRN: [id number] Date of Birth: 1956-07-14  Transition of Care [facility]) CM/SW Contact  Mcarthur Saddie Kim, [place] Phone Number: 03/13/2024, 11:26 AM  Clinical Narrative: Pt will require IV ertapenum for 14 days. Discussed with pt who states her daughter is willing to assist. Pt used [facility] several months ago for home antibiotics. Referred to Pam with [facility] who will complete teaching. Verneita with [facility] updated for [facility]. TOC will follow.         Barriers to Discharge: Continued Medical Work up               Expected Discharge Plan and Services                         DME Arranged: 3-N-1 DME Agency: [facility] Date DME Agency Contacted: 03/12/24 Time DME Agency Contacted: [phone] Representative spoke with at DME Agency: Darlyn HH Arranged: PT, OT, RN [facility] Agency: [facility] Date HH Agency Contacted: 03/13/24 Time HH Agency Contacted: [phone] Representative spoke with at [facility] Agency: [facility]   Social Drivers of Health (SDOH) Interventions SDOH Screenings   Food Insecurity: No Food Insecurity (03/11/2024)  Housing: Low Risk  (03/11/2024)  Transportation Needs: No Transportation Needs (03/11/2024)  Utilities: Not At Risk (03/11/2024)  Alcohol Screen: Low Risk  (09/28/2023)  Depression (PHQ2-9): Low Risk  (12/07/2023)  Financial Resource Strain: Low Risk  (10/12/2023)  Physical Activity: Inactive (10/12/2023)  Social Connections: Socially Isolated (03/11/2024)  Stress: Stress Concern Present (10/12/2023)  Tobacco Use: Low Risk  (03/11/2024)  Health Literacy: Adequate Health Literacy (09/28/2023)    Readmission Risk Interventions     No data to display

## 2024-03-13 NOTE — Progress Notes (Signed)
 PHARMACY CONSULT NOTE FOR:  OUTPATIENT  PARENTERAL ANTIBIOTIC THERAPY (OPAT)  Indication: ESBL Klebsiella UTI Regimen: Ertapenem  1g IV q24h End date: 03/23/24  IV antibiotic discharge orders are pended. To discharging provider:  please sign these orders via discharge navigator,  Select New Orders & click on the button choice - Manage This Unsigned Work.     Thank you for allowing pharmacy to be a part of this patient's care.  Izetta Carl, PharmD PGY1 Pharmacy Resident [facility]  03/13/2024 2:59 PM

## 2024-03-13 NOTE — Plan of Care (Signed)

## 2024-03-13 NOTE — Progress Notes (Signed)
 PROGRESS NOTE    Amy Livingston  FMW:[id number] DOB: 02-21-1956 DOA: 03/11/2024 PCP: Zarwolo, Gloria, FNP   Brief Narrative:    Amy Livingston is a 68 y.o. female with medical history significant for diabetes mellitus, hypertension, paroxysmal atrial fibrillation, anxiety and depression. Patient presented to the ED with complaints of weakness, pain with urination and urinary frequency.  Patient went to urgent care 7/29 with urinary symptoms, was diagnosed with UTI, she was discharged on Ceftin .  Urine cultures obtained grew E. coli and multidrug resistant Klebsiella.  Attempts were made to reach patient, but patient said she felt ill and was not checking her phone.  Patient has now been admitted with sepsis, POA secondary to multidrug-resistant E. coli and started on IV Merrem .  She is also noted to have AKI as well as diarrhea reminiscent of prior C. difficile infection.  She is now noted to have Klebsiella bacteremia and ID consulted for further evaluation with repeat blood cultures pending.  Midline to be placed 8/5 and anticipate discharge with total 14-day course of antibiotics with ertapenem  outpatient.  Assessment & Plan:   Principal Problem:   Sepsis due to gram-negative UTI (HCC) Active Problems:   Hypokalemia   Hyponatremia   Hypomagnesemia   Severe sepsis (HCC)   AKI (acute kidney injury) (HCC)   Diabetes 1.5, managed as type 2 (HCC)   PAF (paroxysmal atrial fibrillation) (HCC)   Generalized anxiety disorder   Essential hypertension  Assessment and Plan:   Sepsis due to Klebsiella and E. coli UTI (HCC) with Klebsiella bacteremia -Continue on Merrem  - Appreciate ongoing ID recommendations with repeat blood cultures ordered and results are pending - Plan for midline catheter placement 8/5 - Anticipate discharge in the next 24-48 hours with home ertapenem  for total 14-day course  AKI (acute kidney injury) (HCC)-resolved Creatinine 1.17, baseline 0.6.  In the setting of  severe sepsis secondary to UTI, diarrhea. 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  Replete and reevaluate in a.m..  Essential hypertension Stable.  Generalized anxiety disorder Pending med reconciliation - resume buspirone , Zoloft   PAF (paroxysmal atrial fibrillation) (HCC) Heart rate 93-104. - Resume Eliquis    Diabetes 1.5, managed as type 2 (HCC) with mild hyperglycemia - HGbA1c 8.9% - SSI- M -Supposed to be on NPH 30 units BID-ran out a week ago, appreciate diabetes coordinator recommendations with initiation of Semglee  as well as mealtime insulin .   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  Consultants:  ID  Procedures:  None  Antimicrobials:  Anti-infectives (From admission, onward)    Start     Dose/Rate Route Frequency Ordered Stop   03/12/24 1345  vancomycin  (VANCOCIN ) capsule 125 mg        125 mg Oral Every 12 hours 03/12/24 1255     03/11/24 1800  meropenem  (MERREM ) 1 g in sodium chloride  0.9 % 100 mL IVPB  1 g 200 mL/hr over 30 Minutes Intravenous Every 8 hours 03/11/24 1628     03/11/24 1430  piperacillin -tazobactam (ZOSYN ) IVPB 3.375 g        3.375 g 100 mL/hr over 30 Minutes Intravenous  Once 03/11/24 1417 03/11/24 1530       Subjective: Patient seen and  evaluated today with significant amounts of urinary retention noted overnight requiring multiple catheterizations.  Foley placed this morning with some bloody output related to trauma.  She is overall feeling well and remains on IV antibiotics.  Objective: Vitals:   03/12/24 0504 03/12/24 1251 03/12/24 2033 03/13/24 0610  BP: 125/63 (!) 127/59 (!) 169/79 121/60  Pulse: 96 89 (!) 108 (!) 104  Resp: 18  19   Temp: 98.9 F (37.2 C) 98.6 F (37 C) 99.7 F (37.6 C) 98.7 F (37.1 C)  TempSrc:  Oral Oral Oral  SpO2: 94% 95% 97% 98%  Weight:      Height:        Intake/Output Summary (Last 24 hours) at 03/13/2024 1112 Last data filed at 03/13/2024 1040 Gross per 24 hour  Intake 780 ml  Output 2250 ml  Net -1470 ml   Filed Weights   03/11/24 1152 03/11/24 1701  Weight: 92.1 kg 94.2 kg    Examination:  General exam: Appears calm and comfortable  Respiratory system: Clear to auscultation. Respiratory effort normal. Cardiovascular system: S1 & S2 heard, RRR.  Gastrointestinal system: Abdomen is soft Central nervous system: Alert and awake Extremities: No edema Skin: No significant lesions noted Psychiatry: Flat affect. Foley with some bloody output    Data Reviewed: I have personally reviewed following labs and imaging studies  CBC: Recent Labs  Lab 03/11/24 1449 03/12/24 0310 03/12/24 0946 03/13/24 0513  WBC 18.0* 15.9*  --  12.3*  NEUTROABS 14.3*  --   --   --   HGB 8.4* 7.6* 8.1* 7.8*  HCT 27.5* 24.6* 25.6* 25.4*  MCV 86.5 85.4  --  84.9  PLT 312 312  --  331   Basic Metabolic Panel: Recent Labs  Lab 03/11/24 1449 03/11/24 1630 03/12/24 0310 03/13/24 0513  NA 131*  --  134* 138  K 2.7*  --  3.7 3.6  CL 95*  --  102 105  CO2 24  --  24 26  GLUCOSE 204*  --  181* 187*  BUN 15  --  15 14  CREATININE 1.17*  --  1.03* 0.85  CALCIUM  8.6*  --  8.1* 8.4*  MG  --  1.5*  --  1.6*   GFR: Estimated Creatinine Clearance: 74.3 mL/min (by C-G formula based on SCr of  0.85 mg/dL). Liver Function Tests: Recent Labs  Lab 03/11/24 1449  AST 18  ALT 11  ALKPHOS 147*  BILITOT 0.6  PROT 5.6*  ALBUMIN 2.2*   No results for input(s): LIPASE, AMYLASE in the last 168 hours. No results for input(s): AMMONIA in the last 168 hours. Coagulation Profile: No results for input(s): INR, PROTIME in the last 168 hours. Cardiac Enzymes: No results for input(s): CKTOTAL, CKMB, CKMBINDEX, TROPONINI in the last 168 hours. BNP (last 3 results) No results for input(s): PROBNP in the last 8760 hours. HbA1C: Recent Labs    03/11/24 1449  HGBA1C 8.9*   CBG: Recent Labs  Lab 03/12/24 0730 03/12/24 1114 03/12/24 1607 03/12/24 2053 03/13/24 0719  GLUCAP 165* 296* 248* 192* 176*   Lipid Profile: No results for input(s): CHOL, HDL, LDLCALC, TRIG, CHOLHDL, LDLDIRECT  in the last 72 hours. Thyroid  Function Tests: No results for input(s): TSH, T4TOTAL, FREET4, T3FREE, THYROIDAB in the last 72 hours. Anemia Panel: No results for input(s): VITAMINB12, FOLATE, FERRITIN, TIBC, IRON, RETICCTPCT in the last 72 hours. Sepsis Labs: Recent Labs  Lab 03/11/24 1449 03/11/24 1630  LATICACIDVEN 2.7* 1.4    Recent Results (from the past 240 hours)  Urine Culture     Status: Abnormal   Collection Time: 03/06/24  2:00 PM   Specimen: Urine, Clean Catch  Result Value Ref Range Status   Specimen Description   Final    URINE, CLEAN CATCH Performed at [facility], [address], [place], [place] [address]    Special Requests   Final    NONE Performed at [facility], [address], [place], [place] [address]    Culture (A)  Final    50,000 COLONIES/mL ESCHERICHIA COLI >=100,000 COLONIES/mL KLEBSIELLA PNEUMONIAE Confirmed Extended Spectrum Beta-Lactamase Producer (ESBL).  In bloodstream infections from ESBL organisms, carbapenems are preferred over piperacillin /tazobactam. They are shown to have a lower risk of  mortality.    Report Status 03/09/2024 FINAL  Final   Organism ID, Bacteria ESCHERICHIA COLI (A)  Final   Organism ID, Bacteria KLEBSIELLA PNEUMONIAE (A)  Final      Susceptibility   Escherichia coli - MIC*    AMPICILLIN 4 SENSITIVE Sensitive     CEFAZOLIN  <=4 SENSITIVE Sensitive     CEFEPIME  <=0.12 SENSITIVE Sensitive     CEFTRIAXONE  <=0.25 SENSITIVE Sensitive     CIPROFLOXACIN >=4 RESISTANT Resistant     GENTAMICIN <=1 SENSITIVE Sensitive     IMIPENEM <=0.25 SENSITIVE Sensitive     NITROFURANTOIN  <=16 SENSITIVE Sensitive     TRIMETH/SULFA <=20 SENSITIVE Sensitive     AMPICILLIN/SULBACTAM <=2 SENSITIVE Sensitive     PIP/TAZO <=4 SENSITIVE Sensitive ug/mL    * 50,000 COLONIES/mL ESCHERICHIA COLI   Klebsiella pneumoniae - MIC*    AMPICILLIN >=32 RESISTANT Resistant     CEFAZOLIN  >=64 RESISTANT Resistant     CEFEPIME  >=32 RESISTANT Resistant     CEFTRIAXONE  >=64 RESISTANT Resistant     CIPROFLOXACIN 1 RESISTANT Resistant     GENTAMICIN >=16 RESISTANT Resistant     IMIPENEM <=0.25 SENSITIVE Sensitive     NITROFURANTOIN  128 RESISTANT Resistant     TRIMETH/SULFA >=320 RESISTANT Resistant     AMPICILLIN/SULBACTAM >=32 RESISTANT Resistant     PIP/TAZO <=4 SENSITIVE Sensitive ug/mL    * >=100,000 COLONIES/mL KLEBSIELLA PNEUMONIAE  Blood culture (routine x 2)     Status: Abnormal (Preliminary result)   Collection Time: 03/11/24  2:49 PM   Specimen: Right Antecubital; Blood  Result Value Ref Range Status   Specimen Description   Final    RIGHT ANTECUBITAL BOTTLES DRAWN AEROBIC AND ANAEROBIC Performed at [facility], [address], [place], [place] [address]    Special Requests   Final    Blood Culture adequate volume Performed at [facility], [address], [place], [place] [address]    Culture  Setup Time   Final    GRAM NEGATIVE RODS IN BOTH AEROBIC AND ANAEROBIC BOTTLES Gram Stain Report Called to,Read Back By and Verified With: L FLORES AT 0826 ON [id number] BY S  DALTON Organism ID to follow GRAM STAIN REVIEWED-AGREE WITH RESULT DRT CRITICAL RESULT CALLED TO, READ BACK BY AND VERIFIED WITH: PHARMD STEVEN HURTH ON 03/12/24 @ 1410 BY DRT    Culture (A)  Final    KLEBSIELLA PNEUMONIAE SUSCEPTIBILITIES TO FOLLOW Performed  at [facility], [address], [place], [place] [address]    Report Status PENDING  Incomplete  Blood culture (routine x 2)     Status: None (Preliminary result)   Collection Time: 03/11/24  2:49 PM   Specimen: BLOOD LEFT FOREARM  Result Value Ref Range Status   Specimen Description   Final    BLOOD LEFT FOREARM BOTTLES DRAWN AEROBIC AND ANAEROBIC Performed at [facility], [address], [place], [place] [address]    Special Requests   Final    Blood Culture adequate volume Performed at [facility], [address], [place], [place] [address]    Culture  Setup Time   Final    GRAM NEGATIVE RODS ANAEROBIC BOTTLE ONLY Gram Stain Report Called to,Read Back By and Verified With: L FLORES AT 0826 ON [id number] BY S DALTON GRAM STAIN REVIEWED-AGREE WITH RESULT DRT Performed at [facility], [address] [place], [place] [address]    Culture GRAM NEGATIVE RODS  Final   Report Status PENDING  Incomplete  Blood Culture ID Panel (Reflexed)     Status: Abnormal   Collection Time: 03/11/24  2:49 PM  Result Value Ref Range Status   Enterococcus faecalis NOT DETECTED NOT DETECTED Final   Enterococcus Faecium NOT DETECTED NOT DETECTED Final   Listeria monocytogenes NOT DETECTED NOT DETECTED Final   Staphylococcus species NOT DETECTED NOT DETECTED Final   Staphylococcus aureus (BCID) NOT DETECTED NOT DETECTED Final   Staphylococcus epidermidis NOT DETECTED NOT DETECTED Final   Staphylococcus lugdunensis NOT DETECTED NOT DETECTED Final   Streptococcus species NOT DETECTED NOT DETECTED Final   Streptococcus agalactiae NOT DETECTED NOT DETECTED Final   Streptococcus pneumoniae NOT DETECTED NOT DETECTED Final    Streptococcus pyogenes NOT DETECTED NOT DETECTED Final   A.calcoaceticus-baumannii NOT DETECTED NOT DETECTED Final   Bacteroides fragilis NOT DETECTED NOT DETECTED Final   Enterobacterales DETECTED (A) NOT DETECTED Final    Comment: Enterobacterales represent a large order of gram negative bacteria, not a single organism. CRITICAL RESULT CALLED TO, READ BACK BY AND VERIFIED WITH: PHARMD STEVEN HURTH ON 03/12/24 @ 1410 BY DRT    Enterobacter cloacae complex NOT DETECTED NOT DETECTED Final   Escherichia coli NOT DETECTED NOT DETECTED Final   Klebsiella aerogenes NOT DETECTED NOT DETECTED Final   Klebsiella oxytoca NOT DETECTED NOT DETECTED Final   Klebsiella pneumoniae DETECTED (A) NOT DETECTED Final    Comment: CRITICAL RESULT CALLED TO, READ BACK BY AND VERIFIED WITH: PHARMD STEVEN HURTH ON 03/12/24 @ 1410 BY DRT    Proteus species NOT DETECTED NOT DETECTED Final   Salmonella species NOT DETECTED NOT DETECTED Final   Serratia marcescens NOT DETECTED NOT DETECTED Final   Haemophilus influenzae NOT DETECTED NOT DETECTED Final   Neisseria meningitidis NOT DETECTED NOT DETECTED Final   Pseudomonas aeruginosa NOT DETECTED NOT DETECTED Final   Stenotrophomonas maltophilia NOT DETECTED NOT DETECTED Final   Candida albicans NOT DETECTED NOT DETECTED Final   Candida auris NOT DETECTED NOT DETECTED Final   Candida glabrata NOT DETECTED NOT DETECTED Final   Candida krusei NOT DETECTED NOT DETECTED Final   Candida parapsilosis NOT DETECTED NOT DETECTED Final   Candida tropicalis NOT DETECTED NOT DETECTED Final   Cryptococcus neoformans/gattii NOT DETECTED NOT DETECTED Final   CTX-M ESBL DETECTED (A) NOT DETECTED Final    Comment: CRITICAL RESULT CALLED TO, READ BACK BY AND VERIFIED WITH: PHARMD STEVEN HURTH ON 03/12/24 @ 1410 BY DRT (NOTE)  Extended spectrum beta-lactamase detected. Recommend a carbapenem as initial therapy.      Carbapenem resistance IMP NOT DETECTED NOT DETECTED Final    Carbapenem resistance KPC NOT DETECTED NOT DETECTED Final   Carbapenem resistance NDM NOT DETECTED NOT DETECTED Final   Carbapenem resist OXA 48 LIKE NOT DETECTED NOT DETECTED Final   Carbapenem resistance VIM NOT DETECTED NOT DETECTED Final    Comment: Performed at [facility], [address] [place], [place] [address]  Culture, blood (Routine X 2) w Reflex to ID Panel     Status: None (Preliminary result)   Collection Time: 03/13/24  9:24 AM   Specimen: BLOOD  Result Value Ref Range Status   Specimen Description BLOOD BLOOD RIGHT HAND  Final   Special Requests   Final    BOTTLES DRAWN AEROBIC AND ANAEROBIC Blood Culture adequate volume Performed at [facility], [address] [place], [place] [address]    Culture PENDING  Incomplete   Report Status PENDING  Incomplete  Culture, blood (Routine X 2) w Reflex to ID Panel     Status: None (Preliminary result)   Collection Time: 03/13/24  9:24 AM   Specimen: BLOOD  Result Value Ref Range Status   Specimen Description BLOOD LEFT ANTECUBITAL  Final   Special Requests   Final    BOTTLES DRAWN AEROBIC AND ANAEROBIC Blood Culture results may not be optimal due to an inadequate volume of blood received in culture bottles Performed at [facility], [address] [place], [place] [address]    Culture PENDING  Incomplete   Report Status PENDING  Incomplete         Radiology Studies: US  RENAL Result Date: 03/12/2024 CLINICAL DATA:  Bacteremia. EXAM: RENAL / URINARY TRACT ULTRASOUND COMPLETE COMPARISON:  CT 10/08/2023 FINDINGS: Right Kidney: Renal measurements: 13.8 x 5.5 x 6.4 cm = volume: 253 mL. Fullness of the renal collecting system. Proximal ureter is mildly dilated. Similar findings were seen on prior CT. Normal parenchymal echogenicity. No evidence of renal fluid collection, stone or focal lesion. Left Kidney: Renal measurements: 14.9 x 7.6 x 7.9 cm = volume: 465 mL. Moderate hydronephrosis. Punctate stone on prior CT is not  seen by ultrasound. No evidence of focal lesion. No renal fluid collection. Bladder: Diffuse bladder wall thickening. Layering debris. Postvoid residual 167 cc. Other: None. IMPRESSION: 1. Moderate left hydronephrosis. Mild fullness of the right renal collecting system. Similar findings were seen on prior CT. 2. Diffuse bladder wall thickening. Layering debris in the bladder. Findings can be seen with cystitis. 3. Postvoid residual of 567 cc Electronically Signed   By: Andrea Gasman M.D.   On: 03/12/2024 18:17        Scheduled Meds:  apixaban   5 mg Oral BID   atorvastatin   80 mg Oral QHS   Chlorhexidine  Gluconate Cloth  6 each Topical Daily   cyclobenzaprine   10 mg Oral Q12H   gabapentin   300 mg Oral TID   insulin  aspart  0-5 Units Subcutaneous QHS   insulin  aspart  0-9 Units Subcutaneous TID WC   insulin  aspart  6 Units Subcutaneous TID WC   insulin  glargine-yfgn  42 Units Subcutaneous Daily   mirabegron  ER  25 mg Oral QHS   pantoprazole   40 mg Oral Daily   sertraline   50 mg Oral Daily   sodium chloride  flush  10-40 mL Intracatheter Q12H   sodium chloride  flush  10-40 mL Intracatheter Q12H   tamsulosin   0.4 mg Oral Daily  vancomycin   125 mg Oral Q12H   Continuous Infusions:  meropenem  (MERREM ) IV 1 g (03/13/24 0325)     LOS: 2 days    Time spent: 55 minutes    Devan Danzer D Maree, DO [facility]  If 7PM-7AM, please contact night-coverage [email or web address] 03/13/2024, 11:12 AM

## 2024-03-13 NOTE — Telephone Encounter (Signed)
 Patient Product/process development scientist completed.    The patient is insured through [employer]. Patient has Medicare and is not eligible for a copay card, but may be able to apply for patient assistance or Medicare RX Payment Plan (Patient Must reach out to their plan, if eligible for payment plan), if available.    Ran test claim for vancomycin  125 mg caps and the current 14 day co-pay is $0.00.  Ran test claim for vancomycin  25 mg/ml liquid and Not on Formulary  Ran test claim for vancomycin  50 mg/ml liquid and Not on Formulary  This test claim was processed through [facility]- copay amounts may vary at other pharmacies due to [employer], or as the patient moves through the different stages of their insurance plan.     Reyes Sharps, CPHT Pharmacy Technician III Certified Patient Advocate [facility] Pharm[facility] Patient Advocate Team Direct Number: [phone]  Fax: [phone]

## 2024-03-13 NOTE — Progress Notes (Signed)
 Physical Therapy Treatment Patient Details Name: Amy Livingston MRN: [id number] DOB: 04/19/1956 Today's Date: 03/13/2024   History of Present Illness Dimples Probus is a 68 y.o. female with medical history significant for diabetes mellitus, hypertension, paroxysmal atrial fibrillation, anxiety and depression.  Patient presented to the ED with complaints of weakness, pain with urination and urinary frequency.  Patient went to urgent care 7/29 with urinary symptoms, was diagnosed with UTI, she was discharged on Ceftin .  Urine cultures obtained grew E. coli and multidrug resistant Klebsiella.  Attempts were made to reach patient, but patient said she felt ill and was not checking her phone.  She reports a fever of 103 at home.  Reports onset of watery stools over the past 2 days, about 3 times a day.  No vomiting.  No abdominal pain.  She says her symptoms reminded of her previous episode of C. difficile infection.    Patient reports for the past week she has intermittently been seeing black spots or cartoon characters, weird things that are not actually.  She is not having this currently. No confused speech or alteration in consciousness.    PT Comments  Patient agreeable to and tolerated PT session well. Patient received supine in bed, mod independent for bed mobility. Performed seated and standing LE exercises well despite increased pain in groin region due to recently placed catheter. One instance of LOB posteriorly when performing SL exercises (march), requiring min A for recovery. Session concluded due to fatigue. Pt returned to bed with call button within reach. Patient will benefit from continued skilled physical therapy acutely and in recommended venue in order to address remaining deficits to maximize independence.     If plan is discharge home, recommend the following: A little help with walking and/or transfers;A little help with bathing/dressing/bathroom;Help with stairs or ramp for  entrance;Assistance with cooking/housework   Can travel by private vehicle        Equipment Recommendations  None recommended by PT    Recommendations for Other Services       Precautions / Restrictions Precautions Precautions: Fall Recall of Precautions/Restrictions: Intact Restrictions Weight Bearing Restrictions Per Provider Order: No     Mobility  Bed Mobility Overal bed mobility: Modified Independent             General bed mobility comments: HOB slightly elevated. Inc time needed. Slow labored and painful movement    Transfers Overall transfer level: Needs assistance Equipment used: Rolling walker (2 wheels) Transfers: Sit to/from Stand Sit to Stand: Contact guard assist, Min assist           General transfer comment: Inc time needed for STS due to pain and general fatigue/weakness. One instance of LOB in posterior direction during standing exercises requiring min A    Ambulation/Gait               General Gait Details: Pt decline further mobility or ambulation due to faitgue in LE following seated/standing LE exercises   Stairs             Wheelchair Mobility     Tilt Bed    Modified Rankin (Stroke Patients Only)       Balance Overall balance assessment: Needs assistance Sitting-balance support: Feet supported, No upper extremity supported Sitting balance-Leahy Scale: Good Sitting balance - Comments: Seated EOB during LE exercises   Standing balance support: Bilateral upper extremity supported, During functional activity, Reliant on assistive device for balance Standing balance-Leahy Scale: Fair Standing balance comment: fair with  RW          Communication Communication Communication: No apparent difficulties  Cognition Arousal: Alert Behavior During Therapy: WFL for tasks assessed/performed   PT - Cognitive impairments: No apparent impairments       Following commands: Intact      Cueing Cueing Techniques:  Verbal cues  Exercises General Exercises - Lower Extremity Long Arc Quad: AROM, Strengthening, Both, 10 reps, Seated, Other (comment) (Emphasis on quick concentric and slow eccentric) Hip Flexion/Marching: AROM, Strengthening, Both, 10 reps, Standing, Other (comment) (w/ RW support) Toe Raises: AROM, Strengthening, Both, 10 reps, Seated Heel Raises: AROM, Strengthening, Both, 10 reps, Seated Other Exercises Other Exercises: Hamstring curls, AROM, strengthening, both, 10 reps, standing, w /support of RW    General Comments        Pertinent Vitals/Pain Pain Assessment Pain Assessment: 0-10 Pain Score: 9  Pain Location: From recently placed catheter Pain Descriptors / Indicators: Aching Pain Intervention(s): Limited activity within patient's tolerance, Monitored during session    Home Living                          Prior Function            PT Goals (current goals can now be found in the care plan section) Acute Rehab PT Goals Patient Stated Goal: return home with family to assist PT Goal Formulation: With patient Time For Goal Achievement: 03/16/24 Potential to Achieve Goals: Good Progress towards PT goals: Progressing toward goals    Frequency    Min 3X/week      PT Plan      Co-evaluation     PT goals addressed during session: Mobility/safety with mobility;Strengthening/ROM        AM-PAC PT 6 Clicks Mobility   Outcome Measure  Help needed turning from your back to your side while in a flat bed without using bedrails?: None Help needed moving from lying on your back to sitting on the side of a flat bed without using bedrails?: None Help needed moving to and from a bed to a chair (including a wheelchair)?: A Little Help needed standing up from a chair using your arms (e.g., wheelchair or bedside chair)?: A Little Help needed to walk in hospital room?: A Little Help needed climbing 3-5 steps with a railing? : A Lot 6 Click Score: 19    End  of Session   Activity Tolerance: Patient tolerated treatment well;Patient limited by fatigue Patient left: in bed;with call bell/phone within reach   PT Visit Diagnosis: Unsteadiness on feet (R26.81);Other abnormalities of gait and mobility (R26.89);Muscle weakness (generalized) (M62.81)     Time: 9047-8991 PT Time Calculation (min) (ACUTE ONLY): 16 min  Charges:    $Therapeutic Exercise: 8-22 mins PT General Charges $$ ACUTE PT VISIT: 1 Visit                     11:00 AM, 03/13/24 Rosaria Settler, PT, DPT [place] with [facility]

## 2024-03-13 NOTE — Plan of Care (Signed)
   Problem: Education: Goal: Knowledge of General Education information will improve Description Including pain rating scale, medication(s)/side effects and non-pharmacologic comfort measures Outcome: Progressing   Problem: Health Behavior/Discharge Planning: Goal: Ability to manage health-related needs will improve Outcome: Progressing

## 2024-03-13 NOTE — Progress Notes (Signed)
 Foley ordered by Dr. Maree , placed 16 fr Ronal Caldron, RN and Benton RN , patient tolerated well and 1200cc of amber/cloudy urine drained to collection bag

## 2024-03-14 DIAGNOSIS — Z8619 Personal history of other infectious and parasitic diseases: Secondary | ICD-10-CM

## 2024-03-14 DIAGNOSIS — N1330 Unspecified hydronephrosis: Secondary | ICD-10-CM

## 2024-03-14 DIAGNOSIS — Z1612 Extended spectrum beta lactamase (ESBL) resistance: Secondary | ICD-10-CM

## 2024-03-14 DIAGNOSIS — B9620 Unspecified Escherichia coli [E. coli] as the cause of diseases classified elsewhere: Secondary | ICD-10-CM

## 2024-03-14 DIAGNOSIS — A4150 Gram-negative sepsis, unspecified: Secondary | ICD-10-CM | POA: Diagnosis not present

## 2024-03-14 DIAGNOSIS — B961 Klebsiella pneumoniae [K. pneumoniae] as the cause of diseases classified elsewhere: Secondary | ICD-10-CM

## 2024-03-14 DIAGNOSIS — N390 Urinary tract infection, site not specified: Secondary | ICD-10-CM | POA: Diagnosis not present

## 2024-03-14 DIAGNOSIS — R339 Retention of urine, unspecified: Secondary | ICD-10-CM

## 2024-03-14 DIAGNOSIS — B9689 Other specified bacterial agents as the cause of diseases classified elsewhere: Secondary | ICD-10-CM

## 2024-03-14 DIAGNOSIS — N320 Bladder-neck obstruction: Secondary | ICD-10-CM

## 2024-03-14 DIAGNOSIS — R7881 Bacteremia: Secondary | ICD-10-CM

## 2024-03-14 LAB — CBC
HCT: 25.1 % — ABNORMAL LOW (ref 36.0–46.0)
Hemoglobin: 7.8 g/dL — ABNORMAL LOW (ref 12.0–15.0)
MCH: 26.6 pg (ref 26.0–34.0)
MCHC: 31.1 g/dL (ref 30.0–36.0)
MCV: 85.7 fL (ref 80.0–100.0)
Platelets: 339 K/uL (ref 150–400)
RBC: 2.93 MIL/uL — ABNORMAL LOW (ref 3.87–5.11)
RDW: 16.1 % — ABNORMAL HIGH (ref 11.5–15.5)
WBC: 10.9 K/uL — ABNORMAL HIGH (ref 4.0–10.5)
nRBC: 0 % (ref 0.0–0.2)

## 2024-03-14 LAB — CULTURE, BLOOD (ROUTINE X 2)
Special Requests: ADEQUATE
Special Requests: ADEQUATE

## 2024-03-14 LAB — GLUCOSE, CAPILLARY
Glucose-Capillary: 191 mg/dL — ABNORMAL HIGH (ref 70–99)
Glucose-Capillary: 218 mg/dL — ABNORMAL HIGH (ref 70–99)
Glucose-Capillary: 82 mg/dL (ref 70–99)
Glucose-Capillary: 113 mg/dL — ABNORMAL HIGH (ref 70–99)

## 2024-03-14 LAB — BASIC METABOLIC PANEL WITH GFR
Anion gap: 11 (ref 5–15)
BUN: 12 mg/dL (ref 8–23)
CO2: 26 mmol/L (ref 22–32)
Calcium: 8.4 mg/dL — ABNORMAL LOW (ref 8.9–10.3)
Chloride: 100 mmol/L (ref 98–111)
Creatinine, Ser: 0.81 mg/dL (ref 0.44–1.00)
GFR, Estimated: >60 mL/min (ref >60)
Glucose, Bld: 215 mg/dL — ABNORMAL HIGH (ref 70–99)
Potassium: 4.4 mmol/L (ref 3.5–5.1)
Sodium: 137 mmol/L (ref 135–145)

## 2024-03-14 LAB — MAGNESIUM: Magnesium: 1.7 mg/dL (ref 1.7–2.4)

## 2024-03-14 MED ORDER — SODIUM CHLORIDE 0.9 % IV SOLN
1.0000 g | INTRAVENOUS | Status: DC
  Administered 2024-03-14 – 2024-03-15 (×2): 1 g via INTRAVENOUS
  Filled 2024-03-14 (×3): qty 1000

## 2024-03-14 MED ORDER — SODIUM CHLORIDE 0.9% FLUSH
10.0000 mL | Freq: Two times a day (BID) | INTRAVENOUS | Status: DC
  Administered 2024-03-14 (×2): 10 mL

## 2024-03-14 MED ORDER — SODIUM CHLORIDE 0.9% FLUSH: 10.0000 mL | INTRAVENOUS | Status: DC | PRN

## 2024-03-14 NOTE — Plan of Care (Signed)

## 2024-03-14 NOTE — Progress Notes (Signed)

## 2024-03-14 NOTE — Progress Notes (Signed)
 Therapist attempted to work with pt, pt refused.  Requested just to do some exercises in bed; pt continued to refuse.  Montie Metro, PT CLT [phone]

## 2024-03-14 NOTE — Progress Notes (Signed)
 Mobility Specialist Progress Note:    03/14/24 1228  Mobility  Activity Ambulated with assistance;Pivoted/transferred from bed to chair  Level of Assistance Contact guard assist, steadying assist  Assistive Device Front wheel walker  Distance Ambulated (ft) 50 ft  Range of Motion/Exercises Active;All extremities  Activity Response Tolerated well  Mobility Referral Yes  Mobility visit 1 Mobility  Mobility Specialist Start Time (ACUTE ONLY) 1200  Mobility Specialist Stop Time (ACUTE ONLY) 1228  Mobility Specialist Time Calculation (min) (ACUTE ONLY) 28 min   Pt received in bed, agreeable to mobility. Required CGA to stand and ambulate with RW. Tolerated well, asx throughout. Returned to room, left in chair. All needs met.   Sherrilee Ditty Mobility Specialist Please contact via Special educational needs teacher or  Rehab office at [phone]

## 2024-03-14 NOTE — TOC Progression Note (Signed)
 Transition of Care [facility]) - Progression Note    Patient Details  Name: Amy Livingston MRN: [id number] Date of Birth: 22-Apr-1956  Transition of Care [facility] CM/SW Contact  Mcarthur Saddie Kim, [place] Phone Number: 03/14/2024, 10:14 AM  Clinical Narrative:  Possible d/c tomorrow. Pam with [facility] and Leita with [facility] updated. MD notified for HHPT/OT/RN orders.        Barriers to Discharge: Continued Medical Work up               Expected Discharge Plan and Services                         DME Arranged: 3-N-1 DME Agency: [employer] Date DME Agency Contacted: 03/12/24 Time DME Agency Contacted: [phone] Representative spoke with at DME Agency: Darlyn HH Arranged: PT, OT, RN [facility] Agency: [facility] Date HH Agency Contacted: 03/13/24 Time HH Agency Contacted: [phone] Representative spoke with at [facility] Agency: Leita   Social Drivers of Health (SDOH) Interventions SDOH Screenings   Food Insecurity: No Food Insecurity (03/11/2024)  Housing: Low Risk  (03/11/2024)  Transportation Needs: No Transportation Needs (03/11/2024)  Utilities: Not At Risk (03/11/2024)  Alcohol Screen: Low Risk  (09/28/2023)  Depression (PHQ2-9): Low Risk  (12/07/2023)  Financial Resource Strain: Low Risk  (10/12/2023)  Physical Activity: Inactive (10/12/2023)  Social Connections: Socially Isolated (03/11/2024)  Stress: Stress Concern Present (10/12/2023)  Tobacco Use: Low Risk  (03/11/2024)  Health Literacy: Adequate Health Literacy (09/28/2023)    Readmission Risk Interventions     No data to display

## 2024-03-14 NOTE — Consult Note (Signed)
 NAME: Amy Livingston  DOB: [birth year]  MRN: [id number]  Date/Time: 03/14/2024 5:37 PM  REQUESTING PROVIDER: Dr.Shah Subjective:  REASON FOR CONSULT: ESBL klebsiella UTI/bacteremia ? Pt is in [facility]- I spoke to her on 8/4 and today I did a Telemedicine (video visit  Amy Livingston is a 68 y.o. female with a history of DM, HTN, PAF on AC,  staph lugdunensis bacteremia in March 2025, neg TEE , h/o c5-C6 decompression/fusion  treated with 2 weeks IV, Cdiff diarrhea, ,recurrent UTI, urinary incontinence presented to [facility] on 03/10/24  with weakness, dysuria, urinary frequency and worsening urinary incontinence for the past week or so- [facility] and was prescribed cefuroxime . Urine culture grew ESBL klebsiella and ecoli. [facility] tried to reach her. She had a a fever of 103 at home Vitals in the ED  03/11/24 11:54  BP 116/57 !  Temp 98.6 F (37 C)  Pulse Rate 102 !  Resp 16  SpO2 94 %     Latest Reference Range & Units 03/11/24 14:49  WBC 4.0 - 10.5 K/uL 18.0 (H)  Hemoglobin 12.0 - 15.0 g/dL 8.4 (L)  HCT 63.9 - 53.9 % 27.5 (L)  Platelets 150 - 400 K/uL 312  Creatinine 0.44 - 1.00 mg/dL 8.82 (H)   Recent antibiotic use 02/01/24 Keflex  03/06/24  cefuroxime  Blood culture sent  She was started on PIPtazo BC came back positive fo ESBL klebsiella I am seeing the patient for that. The antibiotic was changed to IV meropenem  On 8/4 I ordered renal US  and post void bladder scan  The latter showed bladder volume of upto 700  The renal ultrasound came back as left kidney moderate hydronephrosis with a volume of 65 mL.  No renal fluid collection.  Bladder showed diffuse bladder wall thickening with layering debris.  At that time the postvoid residual was 167 cc.  There was full thickness of the renal collecting system on the right kidney with proximal ureter mildly dilated. Patient had to get a Foley catheter because of persistent retained urine and once it was up to 1000 cc. Repeat blood  culture was sent yesterday.  And today patient has a midline Patient is doing better She has more energy today She is not constipated She worked with PT No dizziness No fever    Past Medical History:  Diagnosis Date   Anxiety    Atrial fibrillation (HCC)    Diabetes mellitus without complication (HCC)    Dysrhythmia    GERD (gastroesophageal reflux disease)    Hypertension    PONV (postoperative nausea and vomiting)    Shortness of breath dyspnea     Past Surgical History:  Procedure Laterality Date   ABDOMINAL HYSTERECTOMY     ANTERIOR CERVICAL DECOMP/DISCECTOMY FUSION N/A 01/13/2016   Procedure: C5-6 ANTERIOR CERVICAL DECOMPRESSION/DISKECTOMY/FUSION;  Surgeon: Victory Gens, MD;  Location: [facility];  Service: Neurosurgery;  Laterality: N/A;  C5-6 ANTERIOR CERVICAL DECOMPRESSION/DISKECTOMY/FUSION   BACK SURGERY     x 5   BIOPSY  07/25/2023   Procedure: BIOPSY;  Surgeon: Cindie Carlin POUR, DO;  Location: [facility];  Service: Endoscopy;;   CHOLECYSTECTOMY     COLONOSCOPY WITH PROPOFOL  N/A 07/25/2023   Procedure: COLONOSCOPY WITH PROPOFOL ;  Surgeon: Cindie Carlin POUR, DO;  Location: [facility];  Service: Endoscopy;  Laterality: N/A;  10:45 am, asa 3   ESOPHAGEAL BRUSHING  07/25/2023   Procedure: ESOPHAGEAL BRUSHING;  Surgeon: Cindie Carlin POUR, DO;  Location: [facility];  Service:  Endoscopy;;   ESOPHAGOGASTRODUODENOSCOPY (EGD) WITH PROPOFOL  N/A 07/25/2023   Procedure: ESOPHAGOGASTRODUODENOSCOPY (EGD) WITH PROPOFOL ;  Surgeon: Cindie Carlin POUR, DO;  Location: [facility];  Service: Endoscopy;  Laterality: N/A;   FOOT SURGERY     KNEE ARTHROSCOPY     left   KNEE SURGERY Right    POLYPECTOMY  07/25/2023   Procedure: POLYPECTOMY INTESTINAL;  Surgeon: Cindie Carlin POUR, DO;  Location: [facility];  Service: Endoscopy;;   TEE WITHOUT CARDIOVERSION N/A 10/13/2023   Procedure: ECHOCARDIOGRAM, TRANSESOPHAGEAL;  Surgeon: Stacia Diannah SQUIBB, MD;  Location: [facility];   Service: Cardiovascular;  Laterality: N/A;   TUBAL LIGATION      Social History   Socioeconomic History   Marital status: Divorced    Spouse name: Not on file   Number of children: Not on file   Years of education: Not on file   Highest education level: Some college, no degree  Occupational History   Not on file  Tobacco Use   Smoking status: Never   Smokeless tobacco: Never  Substance and Sexual Activity   Alcohol use: No   Drug use: No   Sexual activity: Not on file  Other Topics Concern   Not on file  Social History Narrative   Not on file   Social Drivers of Health   Financial Resource Strain: Low Risk  (10/12/2023)   Overall Financial Resource Strain (CARDIA)    Difficulty of Paying Living Expenses: Not very hard  Food Insecurity: No Food Insecurity (03/11/2024)   Hunger Vital Sign    Worried About Running Out of Food in the Last Year: Never true    Ran Out of Food in the Last Year: Never true  Transportation Needs: No Transportation Needs (03/11/2024)   PRAPARE - Administrator, Civil Service (Medical): No    Lack of Transportation (Non-Medical): No  Physical Activity: Inactive (10/12/2023)   Exercise Vital Sign    Days of Exercise per Week: 0 days    Minutes of Exercise per Session: 0 min  Stress: Stress Concern Present (10/12/2023)   [employer] of Occupational Health - Occupational Stress Questionnaire    Feeling of Stress : Rather much  Social Connections: Socially Isolated (03/11/2024)   Social Connection and Isolation Panel    Frequency of Communication with Friends and Family: More than three times a week    Frequency of Social Gatherings with Friends and Family: Once a week    Attends Religious Services: Never    Database administrator or Organizations: No    Attends Banker Meetings: Never    Marital Status: Divorced  Catering manager Violence: Not At Risk (03/11/2024)   Humiliation, Afraid, Rape, and Kick questionnaire    Fear of  Current or Ex-Partner: No    Emotionally Abused: No    Physically Abused: No    Sexually Abused: No    History reviewed. No pertinent family history. Allergies  Allergen Reactions   Elemental Sulfur Itching and Rash    Yeast infections   Metformin  And Related Diarrhea    GI distress   I? Current Facility-Administered Medications  Medication Dose Route Frequency Provider Last Rate Last Admin   acetaminophen  (TYLENOL ) tablet 650 mg  650 mg Oral Q6H PRN Emokpae, Ejiroghene E, MD   650 mg at 03/13/24 2130   Or   acetaminophen  (TYLENOL ) suppository 650 mg  650 mg Rectal Q6H PRN Emokpae, Ejiroghene E, MD       alum &  mag hydroxide-simeth (MAALOX/MYLANTA) 200-200-20 MG/5ML suspension 30 mL  30 mL Oral Q4H PRN Maree, Pratik D, DO   30 mL at 03/12/24 1844   apixaban  (ELIQUIS ) tablet 5 mg  5 mg Oral BID Emokpae, Ejiroghene E, MD   5 mg at 03/14/24 0820   atorvastatin  (LIPITOR) tablet 80 mg  80 mg Oral QHS Shah, Pratik D, DO   80 mg at 03/13/24 2129   Chlorhexidine  Gluconate Cloth 2 % PADS 6 each  6 each Topical Daily Shah, Pratik D, DO   6 each at 03/14/24 9177   cyclobenzaprine  (FLEXERIL ) tablet 10 mg  10 mg Oral Q12H Shah, Pratik D, DO   10 mg at 03/14/24 0820   ertapenem  (INVANZ ) 1 g in sodium chloride  0.9 % 100 mL IVPB  1 g Intravenous Q24H Fayette Bodily, MD 200 mL/hr at 03/14/24 1037 1 g at 03/14/24 1037   gabapentin  (NEURONTIN ) capsule 300 mg  300 mg Oral TID Shah, Pratik D, DO   300 mg at 03/14/24 1645   insulin  aspart (novoLOG ) injection 0-5 Units  0-5 Units Subcutaneous QHS Maree, Pratik D, DO       insulin  aspart (novoLOG ) injection 0-9 Units  0-9 Units Subcutaneous TID WC Maree, Pratik D, DO   3 Units at 03/14/24 1244   insulin  aspart (novoLOG ) injection 6 Units  6 Units Subcutaneous TID WC Maree, Pratik D, DO   6 Units at 03/14/24 1244   insulin  glargine-yfgn (SEMGLEE ) injection 42 Units  42 Units Subcutaneous Daily Shah, Pratik D, DO   42 Units at 03/14/24 9177   mirabegron  ER  (MYRBETRIQ ) tablet 25 mg  25 mg Oral QHS Shah, Pratik D, DO   25 mg at 03/13/24 2129   ondansetron  (ZOFRAN ) tablet 4 mg  4 mg Oral Q6H PRN Emokpae, Ejiroghene E, MD       Or   ondansetron  (ZOFRAN ) injection 4 mg  4 mg Intravenous Q6H PRN Emokpae, Ejiroghene E, MD       oxyCODONE -acetaminophen  (PERCOCET/ROXICET) 5-325 MG per tablet 1 tablet  1 tablet Oral Q8H PRN Maree, Pratik D, DO   1 tablet at 03/14/24 1645   And   oxyCODONE  (Oxy IR/ROXICODONE ) immediate release tablet 5 mg  5 mg Oral Q8H PRN Maree, Pratik D, DO   5 mg at 03/14/24 1035   pantoprazole  (PROTONIX ) EC tablet 40 mg  40 mg Oral Daily Maree, Pratik D, DO   40 mg at 03/14/24 0820   polyethylene glycol (MIRALAX  / GLYCOLAX ) packet 17 g  17 g Oral Daily PRN Emokpae, Ejiroghene E, MD       sertraline  (ZOLOFT ) tablet 50 mg  50 mg Oral Daily Maree, Pratik D, DO   50 mg at 03/14/24 9178   sodium chloride  flush (NS) 0.9 % injection 10-40 mL  10-40 mL Intracatheter Q12H Shah, Pratik D, DO   10 mL at 03/14/24 9173   sodium chloride  flush (NS) 0.9 % injection 10-40 mL  10-40 mL Intracatheter PRN Maree, Pratik D, DO       sodium chloride  flush (NS) 0.9 % injection 10-40 mL  10-40 mL Intracatheter Q12H Shah, Pratik D, DO   10 mL at 03/14/24 9173   sodium chloride  flush (NS) 0.9 % injection 10-40 mL  10-40 mL Intracatheter PRN Maree, Pratik D, DO       sodium chloride  flush (NS) 0.9 % injection 10-40 mL  10-40 mL Intracatheter Q12H Emokpae, Courage, MD   10 mL at 03/14/24 1247   sodium chloride   flush (NS) 0.9 % injection 10-40 mL  10-40 mL Intracatheter PRN Pearlean, Courage, MD       tamsulosin  (FLOMAX ) capsule 0.4 mg  0.4 mg Oral Daily Maree, Pratik D, DO   0.4 mg at 03/14/24 0820   vancomycin  (VANCOCIN ) capsule 125 mg  125 mg Oral Q12H Fayette Bodily, MD   125 mg at 03/14/24 9180     Abtx:  Anti-infectives (From admission, onward)    Start     Dose/Rate Route Frequency Ordered Stop   03/14/24 1100  ertapenem  (INVANZ ) 1 g in sodium chloride   0.9 % 100 mL IVPB        1 g 200 mL/hr over 30 Minutes Intravenous Every 24 hours 03/14/24 0814     03/13/24 0000  ertapenem  (INVANZ ) IVPB        1 g Intravenous Every 24 hours 03/13/24 1532 03/23/24 2359   03/12/24 1345  vancomycin  (VANCOCIN ) capsule 125 mg        125 mg Oral Every 12 hours 03/12/24 1255     03/11/24 1800  meropenem  (MERREM ) 1 g in sodium chloride  0.9 % 100 mL IVPB  Status:  Discontinued        1 g 200 mL/hr over 30 Minutes Intravenous Every 8 hours 03/11/24 1628 03/14/24 0814   03/11/24 1430  piperacillin -tazobactam (ZOSYN ) IVPB 3.375 g        3.375 g 100 mL/hr over 30 Minutes Intravenous  Once 03/11/24 1417 03/11/24 1530       REVIEW OF SYSTEMS:  Const: fever, negative chills, weakness Eyes: negative diplopia or visual changes, negative eye pain ENT: negative coryza, negative sore throat Resp: negative cough, hemoptysis, dyspnea Cards: negative for chest pain, palpitations, lower extremity edema GU:  frequency, dysuria and no hematuria GI: Negative for abdominal pain, diarrhea, bleeding, constipation Skin: negative for rash and pruritus Heme: negative for easy bruising and gum/nose bleeding MS:  weakness Neurolo:negative for headaches, dizziness, vertigo, memory problems  Psych:  anxiety, depression  Endocrine:  diabetes Allergy/Immunology-sulfa. metformin  Objective:  VITALS:  BP 131/60   Pulse 89   Temp 98.2 F (36.8 C)   Resp 16   Ht 5' 6 (1.676 m)   Wt 94.2 kg   SpO2 100%   BMI 33.52 kg/m   PHYSICAL EXAM:  Patient is lying in bed No distress Pale Neurological nonfocal Moves all extremities Mild ankle edema on the right leg No sores on the feet  Pertinent Labs Lab Results CBC    Component Value Date/Time   WBC 10.9 (H) 03/14/2024 0453   RBC 2.93 (L) 03/14/2024 0453   HGB 7.8 (L) 03/14/2024 0453   HGB 13.7 06/01/2023 1612   HCT 25.1 (L) 03/14/2024 0453   HCT 43.6 06/01/2023 1612   PLT 339 03/14/2024 0453   PLT 193 06/01/2023 1612    MCV 85.7 03/14/2024 0453   MCV 97 06/01/2023 1612   MCH 26.6 03/14/2024 0453   MCHC 31.1 03/14/2024 0453   RDW 16.1 (H) 03/14/2024 0453   RDW 11.9 06/01/2023 1612   LYMPHSABS 2.2 03/11/2024 1449   LYMPHSABS 2.9 06/01/2023 1612   MONOABS 1.2 (H) 03/11/2024 1449   EOSABS 0.2 03/11/2024 1449   EOSABS 0.3 06/01/2023 1612   BASOSABS 0.0 03/11/2024 1449   BASOSABS 0.1 06/01/2023 1612       Latest Ref Rng & Units 03/14/2024    4:53 AM 03/13/2024    5:13 AM 03/12/2024    3:10 AM  CMP  Glucose 70 - 99  mg/dL 784  812  818   BUN 8 - 23 mg/dL 12  14  15    Creatinine 0.44 - 1.00 mg/dL 9.18  9.14  8.96   Sodium 135 - 145 mmol/L 137  138  134   Potassium 3.5 - 5.1 mmol/L 4.4  3.6  3.7   Chloride 98 - 111 mmol/L 100  105  102   CO2 22 - 32 mmol/L 26  26  24    Calcium  8.9 - 10.3 mg/dL 8.4  8.4  8.1       Microbiology: Recent Results (from the past 240 hours)  Urine Culture     Status: Abnormal   Collection Time: 03/06/24  2:00 PM   Specimen: Urine, Clean Catch  Result Value Ref Range Status   Specimen Description   Final    URINE, CLEAN CATCH Performed at [facility], [address] [place], [place] [address]    Special Requests   Final    NONE Performed at [facility], [address] [place], [place] [address]    Culture (A)  Final    50,000 COLONIES/mL ESCHERICHIA COLI >=100,000 COLONIES/mL KLEBSIELLA PNEUMONIAE Confirmed Extended Spectrum Beta-Lactamase Producer (ESBL).  In bloodstream infections from ESBL organisms, carbapenems are preferred over piperacillin /tazobactam. They are shown to have a lower risk of mortality.    Report Status 03/09/2024 FINAL  Final   Organism ID, Bacteria ESCHERICHIA COLI (A)  Final   Organism ID, Bacteria KLEBSIELLA PNEUMONIAE (A)  Final      Susceptibility   Escherichia coli - MIC*    AMPICILLIN 4 SENSITIVE Sensitive     CEFAZOLIN  <=4 SENSITIVE Sensitive     CEFEPIME  <=0.12 SENSITIVE Sensitive     CEFTRIAXONE  <=0.25 SENSITIVE Sensitive      CIPROFLOXACIN >=4 RESISTANT Resistant     GENTAMICIN <=1 SENSITIVE Sensitive     IMIPENEM <=0.25 SENSITIVE Sensitive     NITROFURANTOIN  <=16 SENSITIVE Sensitive     TRIMETH/SULFA <=20 SENSITIVE Sensitive     AMPICILLIN/SULBACTAM <=2 SENSITIVE Sensitive     PIP/TAZO <=4 SENSITIVE Sensitive ug/mL    * 50,000 COLONIES/mL ESCHERICHIA COLI   Klebsiella pneumoniae - MIC*    AMPICILLIN >=32 RESISTANT Resistant     CEFAZOLIN  >=64 RESISTANT Resistant     CEFEPIME  >=32 RESISTANT Resistant     CEFTRIAXONE  >=64 RESISTANT Resistant     CIPROFLOXACIN 1 RESISTANT Resistant     GENTAMICIN >=16 RESISTANT Resistant     IMIPENEM <=0.25 SENSITIVE Sensitive     NITROFURANTOIN  128 RESISTANT Resistant     TRIMETH/SULFA >=320 RESISTANT Resistant     AMPICILLIN/SULBACTAM >=32 RESISTANT Resistant     PIP/TAZO <=4 SENSITIVE Sensitive ug/mL    * >=100,000 COLONIES/mL KLEBSIELLA PNEUMONIAE  Blood culture (routine x 2)     Status: Abnormal   Collection Time: 03/11/24  2:49 PM   Specimen: Right Antecubital; Blood  Result Value Ref Range Status   Specimen Description   Final    RIGHT ANTECUBITAL BOTTLES DRAWN AEROBIC AND ANAEROBIC Performed at [facility], [address], [place], [place] [address]    Special Requests   Final    Blood Culture adequate volume Performed at [facility], [address] [place], [place] [address]    Culture  Setup Time   Final    GRAM NEGATIVE RODS IN BOTH AEROBIC AND ANAEROBIC BOTTLES Gram Stain Report Called to,Read Back By and Verified With: L FLORES AT 0826 ON [id number] BY S DALTON Organism ID to follow GRAM STAIN REVIEWED-AGREE  WITH RESULT DRT CRITICAL RESULT CALLED TO, READ BACK BY AND VERIFIED WITH: PHARMD STEVEN HURTH ON 03/12/24 @ 1410 BY DRT Performed at [facility], [address] [place], [place] [address]    Culture (A)  Final    KLEBSIELLA PNEUMONIAE Confirmed Extended Spectrum Beta-Lactamase Producer (ESBL).  In bloodstream infections from ESBL organisms,  carbapenems are preferred over piperacillin /tazobactam. They are shown to have a lower risk of mortality.    Report Status 03/14/2024 FINAL  Final   Organism ID, Bacteria KLEBSIELLA PNEUMONIAE  Final      Susceptibility   Klebsiella pneumoniae - MIC*    AMPICILLIN >=32 RESISTANT Resistant     CEFEPIME  >=32 RESISTANT Resistant     CEFTAZIDIME RESISTANT Resistant     CEFTRIAXONE  >=64 RESISTANT Resistant     CIPROFLOXACIN 1 RESISTANT Resistant     GENTAMICIN >=16 RESISTANT Resistant     IMIPENEM <=0.25 SENSITIVE Sensitive     TRIMETH/SULFA >=320 RESISTANT Resistant     AMPICILLIN/SULBACTAM >=32 RESISTANT Resistant     PIP/TAZO 16 SENSITIVE Sensitive ug/mL    * KLEBSIELLA PNEUMONIAE  Blood culture (routine x 2)     Status: Abnormal   Collection Time: 03/11/24  2:49 PM   Specimen: BLOOD LEFT FOREARM  Result Value Ref Range Status   Specimen Description   Final    BLOOD LEFT FOREARM BOTTLES DRAWN AEROBIC AND ANAEROBIC Performed at [facility], [address], [place], [place] [address]    Special Requests   Final    Blood Culture adequate volume Performed at [facility], [address], [place], [place] [address]    Culture  Setup Time   Final    GRAM NEGATIVE RODS ANAEROBIC BOTTLE ONLY Gram Stain Report Called to,Read Back By and Verified With: L FLORES AT 0826 ON [id number] BY S DALTON GRAM STAIN REVIEWED-AGREE WITH RESULT DRT    Culture (A)  Final    KLEBSIELLA PNEUMONIAE SUSCEPTIBILITIES PERFORMED ON PREVIOUS CULTURE WITHIN THE LAST 5 DAYS. Performed at [facility], [address], [place], [place] [address]    Report Status 03/14/2024 FINAL  Final  Blood Culture ID Panel (Reflexed)     Status: Abnormal   Collection Time: 03/11/24  2:49 PM  Result Value Ref Range Status   Enterococcus faecalis NOT DETECTED NOT DETECTED Final   Enterococcus Faecium NOT DETECTED NOT DETECTED Final   Listeria monocytogenes NOT DETECTED NOT DETECTED Final   Staphylococcus species NOT  DETECTED NOT DETECTED Final   Staphylococcus aureus (BCID) NOT DETECTED NOT DETECTED Final   Staphylococcus epidermidis NOT DETECTED NOT DETECTED Final   Staphylococcus lugdunensis NOT DETECTED NOT DETECTED Final   Streptococcus species NOT DETECTED NOT DETECTED Final   Streptococcus agalactiae NOT DETECTED NOT DETECTED Final   Streptococcus pneumoniae NOT DETECTED NOT DETECTED Final   Streptococcus pyogenes NOT DETECTED NOT DETECTED Final   A.calcoaceticus-baumannii NOT DETECTED NOT DETECTED Final   Bacteroides fragilis NOT DETECTED NOT DETECTED Final   Enterobacterales DETECTED (A) NOT DETECTED Final    Comment: Enterobacterales represent a large order of gram negative bacteria, not a single organism. CRITICAL RESULT CALLED TO, READ BACK BY AND VERIFIED WITH: PHARMD STEVEN HURTH ON 03/12/24 @ 1410 BY DRT    Enterobacter cloacae complex NOT DETECTED NOT DETECTED Final   Escherichia coli NOT DETECTED NOT DETECTED Final   Klebsiella aerogenes NOT DETECTED NOT DETECTED Final   Klebsiella oxytoca NOT DETECTED NOT DETECTED Final   Klebsiella pneumoniae DETECTED (A) NOT DETECTED  Final    Comment: CRITICAL RESULT CALLED TO, READ BACK BY AND VERIFIED WITH: PHARMD STEVEN HURTH ON 03/12/24 @ 1410 BY DRT    Proteus species NOT DETECTED NOT DETECTED Final   Salmonella species NOT DETECTED NOT DETECTED Final   Serratia marcescens NOT DETECTED NOT DETECTED Final   Haemophilus influenzae NOT DETECTED NOT DETECTED Final   Neisseria meningitidis NOT DETECTED NOT DETECTED Final   Pseudomonas aeruginosa NOT DETECTED NOT DETECTED Final   Stenotrophomonas maltophilia NOT DETECTED NOT DETECTED Final   Candida albicans NOT DETECTED NOT DETECTED Final   Candida auris NOT DETECTED NOT DETECTED Final   Candida glabrata NOT DETECTED NOT DETECTED Final   Candida krusei NOT DETECTED NOT DETECTED Final   Candida parapsilosis NOT DETECTED NOT DETECTED Final   Candida tropicalis NOT DETECTED NOT DETECTED Final    Cryptococcus neoformans/gattii NOT DETECTED NOT DETECTED Final   CTX-M ESBL DETECTED (A) NOT DETECTED Final    Comment: CRITICAL RESULT CALLED TO, READ BACK BY AND VERIFIED WITH: PHARMD STEVEN HURTH ON 03/12/24 @ 1410 BY DRT (NOTE) Extended spectrum beta-lactamase detected. Recommend a carbapenem as initial therapy.      Carbapenem resistance IMP NOT DETECTED NOT DETECTED Final   Carbapenem resistance KPC NOT DETECTED NOT DETECTED Final   Carbapenem resistance NDM NOT DETECTED NOT DETECTED Final   Carbapenem resist OXA 48 LIKE NOT DETECTED NOT DETECTED Final   Carbapenem resistance VIM NOT DETECTED NOT DETECTED Final    Comment: Performed at [facility], [address], [place], [place] [address]  Culture, blood (Routine X 2) w Reflex to ID Panel     Status: None (Preliminary result)   Collection Time: 03/13/24  9:24 AM   Specimen: BLOOD  Result Value Ref Range Status   Specimen Description BLOOD BLOOD RIGHT HAND  Final   Special Requests   Final    BOTTLES DRAWN AEROBIC AND ANAEROBIC Blood Culture adequate volume   Culture   Final    NO GROWTH < 24 HOURS Performed at [facility], [address] [place], [place] [address]    Report Status PENDING  Incomplete  Culture, blood (Routine X 2) w Reflex to ID Panel     Status: None (Preliminary result)   Collection Time: 03/13/24  9:24 AM   Specimen: BLOOD  Result Value Ref Range Status   Specimen Description BLOOD LEFT ANTECUBITAL  Final   Special Requests   Final    BOTTLES DRAWN AEROBIC AND ANAEROBIC Blood Culture results may not be optimal due to an inadequate volume of blood received in culture bottles   Culture   Final    NO GROWTH < 24 HOURS Performed at [facility], [address] [place], [place] [address]    Report Status PENDING  Incomplete    Lines and Device Date on insertion # of days DC  Central line Left mid line    Foley     ETT       IMAGING RESULTS: Reviewed the renal ultrasound personally Left  kidney moderate hydronephrosis Bladder wall thickening ? Impression/Recommendation ESBL klebsiella bacteremia and  complicated UTI due to incomplete bladder emptying with retention and overflow.  There is left-sided hydronephrosis. The cause of bladder outlet obstruction is unclear at this moment Could be related to diabetes Or does she have any genital prolapse Patient now has a Foley catheter. Ecoli in [place] culture as well Pt is on Meropenem  She will be discharged home on IV ertapenem  for a total  of 14 days.  The end date is 03/23/2024  History of C. difficile colitis She came with diarrhea but none since then So she is on prophylactic vancomycin  125 mg p.o. twice daily She will take this medication 3 days post antibiotic.  The end date would be 03/26/2024  Pt is already on Myrbetriq -May consider discontinuing it especially now that she has got a Foley catheter. She has not appointment with urogynecologist this month. Anemia  AKI has resolved. Hypokalemia- resolved  DM currently on insulin .  Last A1c is 8.9 ( Was getting Mounjaro )   PAF on eliquis   C5-c6- fusion surgery     _This consult involved complex antimicrobial management and infection prevention with contact isolation Discussed with patient,and her nurse OPAT note written _______________________________________________  I will follow this patient as outpatient by MyChart video visit. ID will sign off

## 2024-03-14 NOTE — Progress Notes (Signed)
 Mobility Specialist Progress Note:    03/14/24 1020  Mobility  Activity Refused and notified nurse if applicable   Pt politely refused mobility, stated she didn't feel up for it. All needs met.   Amy Livingston Mobility Specialist Please contact via Special educational needs teacher or  Rehab office at [phone]

## 2024-03-14 NOTE — Progress Notes (Signed)
 PROGRESS NOTE    Amy Livingston  FMW:[id number] DOB: September 30, 1955 DOA: 03/11/2024 PCP: Zarwolo, Gloria, FNP   Brief Narrative:    Amy Livingston is a 68 y.o. female with medical history significant for diabetes mellitus, hypertension, paroxysmal atrial fibrillation, anxiety and depression. Patient presented to the ED with complaints of weakness, pain with urination and urinary frequency.  Patient went to urgent care 7/29 with urinary symptoms, was diagnosed with UTI, she was discharged on Ceftin .  Urine cultures obtained grew E. coli and multidrug resistant Klebsiella.  Attempts were made to reach patient, but patient said she felt ill and was not checking her phone.  Patient has now been admitted with sepsis, POA secondary to multidrug-resistant E. coli and started on IV Merrem .  She is also noted to have AKI as well as diarrhea reminiscent of prior C. difficile infection.  She is now noted to have Klebsiella bacteremia and ID consulted for further evaluation with repeat blood cultures pending.  Midline to be placed 8/5 and anticipate discharge with total 14-day course of antibiotics with ertapenem  outpatient.  Assessment & Plan:   Principal Problem:   Sepsis due to gram-negative UTI (HCC) Active Problems:   Hypokalemia   Hyponatremia   Hypomagnesemia   Severe sepsis (HCC)   AKI (acute kidney injury) (HCC)   Diabetes 1.5, managed as type 2 (HCC)   PAF (paroxysmal atrial fibrillation) (HCC)   Generalized anxiety disorder   Essential hypertension  Assessment and Plan:   Sepsis due to Klebsiella and E. coli UTI (HCC) with Klebsiella bacteremia -Continue on Invanz  - Midline placed on 03/14/24 - Anticipate discharge in the next 24 hours with home ertapenem  for total 14-day course if Blood cx from 03/13/24 pending---  AKI (acute kidney injury) (HCC)-resolved Creatinine 1.17, baseline 0.6.  In the setting of severe sepsis secondary to UTI, diarrhea. 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  Replete and reevaluate in a.m..  Essential hypertension Stable.  Generalized anxiety disorder Pending med reconciliation - resume buspirone , Zoloft   PAF (paroxysmal atrial fibrillation) (HCC) Heart rate 93-104. - Resume Eliquis    Diabetes 1.5, managed as type 2 (HCC) with mild hyperglycemia - HGbA1c 8.9% - SSI- M -Supposed to be on NPH 30 units BID-ran out a week ago, appreciate diabetes coordinator recommendations with initiation of Semglee  as well as mealtime insulin .   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  Consultants:  ID  Procedures:  None  Antimicrobials:  Anti-infectives (From admission, onward)    Start     Dose/Rate Route Frequency Ordered Stop   03/14/24 1100  ertapenem  (INVANZ ) 1 g in sodium chloride  0.9 % 100 mL IVPB        1 g 200 mL/hr over 30 Minutes Intravenous Every 24 hours 03/14/24 0814     03/13/24 0000  ertapenem  (INVANZ ) IVPB        1 g  Intravenous Every 24 hours 03/13/24 1532 03/23/24 2359   03/12/24 1345  vancomycin  (VANCOCIN ) capsule 125 mg        125 mg Oral Every 12 hours 03/12/24 1255     03/11/24 1800  meropenem  (MERREM ) 1 g in sodium chloride  0.9 % 100 mL IVPB  Status:  Discontinued        1 g 200 mL/hr over 30 Minutes Intravenous Every 8 hours  03/11/24 1628 03/14/24 0814   03/11/24 1430  piperacillin -tazobactam (ZOSYN ) IVPB 3.375 g        3.375 g 100 mL/hr over 30 Minutes Intravenous  Once 03/11/24 1417 03/11/24 1530       Subjective:  Urinary retention concerns ---foley in situ -No fever  Or chills  No Nausea, Vomiting or Diarrhea     Objective: Vitals:   03/13/24 2011 03/14/24 0501 03/14/24 1324 03/14/24 1426  BP: (!) 110/53 (!) 118/45 (!) 125/106 131/60  Pulse: 89 88 90 89  Resp: 15 16 18 16   Temp: 98.9 F (37.2 C) 98.6 F (37 C) 98.2 F (36.8 C)   TempSrc: Oral Oral    SpO2: 94% 97% 100%   Weight:      Height:        Intake/Output Summary (Last 24 hours) at 03/14/2024 1841 Last data filed at 03/14/2024 1800 Gross per 24 hour  Intake 780.7 ml  Output 2400 ml  Net -1619.3 ml   Filed Weights   03/11/24 1152 03/11/24 1701  Weight: 92.1 kg 94.2 kg    Physical Exam  Gen:- Awake Alert, in no acute distress  HEENT:- [place].AT, No sclera icterus Neck-Supple Neck,No JVD,.  Lungs-  CTAB , fair air movement bilaterally  CV- S1, S2 normal, RRR Abd-  +ve B.Sounds, Abd Soft, No tenderness,    Extremity/Skin:- No  edema,   good pedal pulses  Psych-affect is appropriate, oriented x3 Neuro-Generalized weakness, no new focal deficits, no tremors GU--Foley in situ    Data Reviewed: I have personally reviewed following labs and imaging studies  CBC: Recent Labs  Lab 03/11/24 1449 03/12/24 0310 03/12/24 0946 03/13/24 0513 03/14/24 0453  WBC 18.0* 15.9*  --  12.3* 10.9*  NEUTROABS 14.3*  --   --   --   --   HGB 8.4* 7.6* 8.1* 7.8* 7.8*  HCT 27.5* 24.6* 25.6* 25.4* 25.1*  MCV 86.5 85.4  --  84.9 85.7  PLT 312 312  --  331 339   Basic Metabolic Panel: Recent Labs  Lab 03/11/24 1449 03/11/24 1630 03/12/24 0310 03/13/24 0513 03/14/24 0453  NA 131*  --  134* 138 137  K 2.7*  --  3.7 3.6 4.4  CL 95*  --  102 105 100  CO2 24  --  24 26 26   GLUCOSE 204*  --  181* 187* 215*  BUN 15  --  15 14 12    CREATININE 1.17*  --  1.03* 0.85 0.81  CALCIUM  8.6*  --  8.1* 8.4* 8.4*  MG  --  1.5*  --  1.6* 1.7   GFR: Estimated Creatinine Clearance: 78 mL/min (by C-G formula based on SCr of 0.81 mg/dL). Liver Function Tests: Recent Labs  Lab 03/11/24 1449  AST 18  ALT 11  ALKPHOS 147*  BILITOT 0.6  PROT 5.6*  ALBUMIN 2.2*   CBG: Recent Labs  Lab 03/13/24 1559 03/13/24 2019 03/14/24 0717 03/14/24 1109 03/14/24 1604  GLUCAP 96 169* 191* 218* 82   Sepsis Labs: Recent Labs  Lab 03/11/24  1449 03/11/24 1630  LATICACIDVEN 2.7* 1.4    Recent Results (from the past 240 hours)  Urine Culture     Status: Abnormal   Collection Time: 03/06/24  2:00 PM   Specimen: Urine, Clean Catch  Result Value Ref Range Status   Specimen Description   Final    URINE, CLEAN CATCH Performed at [facility], [address] [place], [place] [address]    Special Requests   Final    NONE Performed at [facility], [address], [place], [place] [address]    Culture (A)  Final    50,000 COLONIES/mL ESCHERICHIA COLI >=100,000 COLONIES/mL KLEBSIELLA PNEUMONIAE Confirmed Extended Spectrum Beta-Lactamase Producer (ESBL).  In bloodstream infections from ESBL organisms, carbapenems are preferred over piperacillin /tazobactam. They are shown to have a lower risk of mortality.    Report Status 03/09/2024 FINAL  Final   Organism ID, Bacteria ESCHERICHIA COLI (A)  Final   Organism ID, Bacteria KLEBSIELLA PNEUMONIAE (A)  Final      Susceptibility   Escherichia coli - MIC*    AMPICILLIN 4 SENSITIVE Sensitive     CEFAZOLIN  <=4 SENSITIVE Sensitive     CEFEPIME  <=0.12 SENSITIVE Sensitive     CEFTRIAXONE  <=0.25 SENSITIVE Sensitive     CIPROFLOXACIN >=4 RESISTANT Resistant     GENTAMICIN <=1 SENSITIVE Sensitive     IMIPENEM <=0.25 SENSITIVE Sensitive     NITROFURANTOIN  <=16 SENSITIVE Sensitive     TRIMETH/SULFA <=20 SENSITIVE Sensitive     AMPICILLIN/SULBACTAM <=2 SENSITIVE Sensitive     PIP/TAZO <=4  SENSITIVE Sensitive ug/mL    * 50,000 COLONIES/mL ESCHERICHIA COLI   Klebsiella pneumoniae - MIC*    AMPICILLIN >=32 RESISTANT Resistant     CEFAZOLIN  >=64 RESISTANT Resistant     CEFEPIME  >=32 RESISTANT Resistant     CEFTRIAXONE  >=64 RESISTANT Resistant     CIPROFLOXACIN 1 RESISTANT Resistant     GENTAMICIN >=16 RESISTANT Resistant     IMIPENEM <=0.25 SENSITIVE Sensitive     NITROFURANTOIN  128 RESISTANT Resistant     TRIMETH/SULFA >=320 RESISTANT Resistant     AMPICILLIN/SULBACTAM >=32 RESISTANT Resistant     PIP/TAZO <=4 SENSITIVE Sensitive ug/mL    * >=100,000 COLONIES/mL KLEBSIELLA PNEUMONIAE  Blood culture (routine x 2)     Status: Abnormal   Collection Time: 03/11/24  2:49 PM   Specimen: Right Antecubital; Blood  Result Value Ref Range Status   Specimen Description   Final    RIGHT ANTECUBITAL BOTTLES DRAWN AEROBIC AND ANAEROBIC Performed at [facility], [address] [place], [place] [address]    Special Requests   Final    Blood Culture adequate volume Performed at [facility], [address] [place], [place] [address]    Culture  Setup Time   Final    GRAM NEGATIVE RODS IN BOTH AEROBIC AND ANAEROBIC BOTTLES Gram Stain Report Called to,Read Back By and Verified With: L FLORES AT 0826 ON [id number] BY S DALTON Organism ID to follow GRAM STAIN REVIEWED-AGREE WITH RESULT DRT CRITICAL RESULT CALLED TO, READ BACK BY AND VERIFIED WITH: PHARMD STEVEN HURTH ON 03/12/24 @ 1410 BY DRT Performed at [facility], [address] [place], [place] [address]    Culture (A)  Final    KLEBSIELLA PNEUMONIAE Confirmed Extended Spectrum Beta-Lactamase Producer (ESBL).  In bloodstream infections from ESBL organisms, carbapenems are preferred over piperacillin /tazobactam. They are shown to have a lower risk of mortality.    Report Status 03/14/2024 FINAL  Final   Organism  ID, Bacteria KLEBSIELLA PNEUMONIAE  Final      Susceptibility   Klebsiella pneumoniae - MIC*    AMPICILLIN >=32  RESISTANT Resistant     CEFEPIME  >=32 RESISTANT Resistant     CEFTAZIDIME RESISTANT Resistant     CEFTRIAXONE  >=64 RESISTANT Resistant     CIPROFLOXACIN 1 RESISTANT Resistant     GENTAMICIN >=16 RESISTANT Resistant     IMIPENEM <=0.25 SENSITIVE Sensitive     TRIMETH/SULFA >=320 RESISTANT Resistant     AMPICILLIN/SULBACTAM >=32 RESISTANT Resistant     PIP/TAZO 16 SENSITIVE Sensitive ug/mL    * KLEBSIELLA PNEUMONIAE  Blood culture (routine x 2)     Status: Abnormal   Collection Time: 03/11/24  2:49 PM   Specimen: BLOOD LEFT FOREARM  Result Value Ref Range Status   Specimen Description   Final    BLOOD LEFT FOREARM BOTTLES DRAWN AEROBIC AND ANAEROBIC Performed at [facility], [address] [place], [place] [address]    Special Requests   Final    Blood Culture adequate volume Performed at [facility], [address], [place], [place] [address]    Culture  Setup Time   Final    GRAM NEGATIVE RODS ANAEROBIC BOTTLE ONLY Gram Stain Report Called to,Read Back By and Verified With: L FLORES AT 0826 ON [id number] BY S DALTON GRAM STAIN REVIEWED-AGREE WITH RESULT DRT    Culture (A)  Final    KLEBSIELLA PNEUMONIAE SUSCEPTIBILITIES PERFORMED ON PREVIOUS CULTURE WITHIN THE LAST 5 DAYS. Performed at [facility], [address], [place], [place] [address]    Report Status 03/14/2024 FINAL  Final  Blood Culture ID Panel (Reflexed)     Status: Abnormal   Collection Time: 03/11/24  2:49 PM  Result Value Ref Range Status   Enterococcus faecalis NOT DETECTED NOT DETECTED Final   Enterococcus Faecium NOT DETECTED NOT DETECTED Final   Listeria monocytogenes NOT DETECTED NOT DETECTED Final   Staphylococcus species NOT DETECTED NOT DETECTED Final   Staphylococcus aureus (BCID) NOT DETECTED NOT DETECTED Final   Staphylococcus epidermidis NOT DETECTED NOT DETECTED Final   Staphylococcus lugdunensis NOT DETECTED NOT DETECTED Final   Streptococcus species NOT DETECTED NOT DETECTED Final    Streptococcus agalactiae NOT DETECTED NOT DETECTED Final   Streptococcus pneumoniae NOT DETECTED NOT DETECTED Final   Streptococcus pyogenes NOT DETECTED NOT DETECTED Final   A.calcoaceticus-baumannii NOT DETECTED NOT DETECTED Final   Bacteroides fragilis NOT DETECTED NOT DETECTED Final   Enterobacterales DETECTED (A) NOT DETECTED Final    Comment: Enterobacterales represent a large order of gram negative bacteria, not a single organism. CRITICAL RESULT CALLED TO, READ BACK BY AND VERIFIED WITH: PHARMD STEVEN HURTH ON 03/12/24 @ 1410 BY DRT    Enterobacter cloacae complex NOT DETECTED NOT DETECTED Final   Escherichia coli NOT DETECTED NOT DETECTED Final   Klebsiella aerogenes NOT DETECTED NOT DETECTED Final   Klebsiella oxytoca NOT DETECTED NOT DETECTED Final   Klebsiella pneumoniae DETECTED (A) NOT DETECTED Final    Comment: CRITICAL RESULT CALLED TO, READ BACK BY AND VERIFIED WITH: PHARMD STEVEN HURTH ON 03/12/24 @ 1410 BY DRT    Proteus species NOT DETECTED NOT DETECTED Final   Salmonella species NOT DETECTED NOT DETECTED Final   Serratia marcescens NOT DETECTED NOT DETECTED Final   Haemophilus influenzae NOT DETECTED NOT DETECTED Final   Neisseria meningitidis NOT DETECTED NOT DETECTED Final   Pseudomonas aeruginosa NOT DETECTED NOT DETECTED Final   Stenotrophomonas maltophilia NOT DETECTED NOT DETECTED  Final   Candida albicans NOT DETECTED NOT DETECTED Final   Candida auris NOT DETECTED NOT DETECTED Final   Candida glabrata NOT DETECTED NOT DETECTED Final   Candida krusei NOT DETECTED NOT DETECTED Final   Candida parapsilosis NOT DETECTED NOT DETECTED Final   Candida tropicalis NOT DETECTED NOT DETECTED Final   Cryptococcus neoformans/gattii NOT DETECTED NOT DETECTED Final   CTX-M ESBL DETECTED (A) NOT DETECTED Final    Comment: CRITICAL RESULT CALLED TO, READ BACK BY AND VERIFIED WITH: PHARMD STEVEN HURTH ON 03/12/24 @ 1410 BY DRT (NOTE) Extended spectrum beta-lactamase detected.  Recommend a carbapenem as initial therapy.      Carbapenem resistance IMP NOT DETECTED NOT DETECTED Final   Carbapenem resistance KPC NOT DETECTED NOT DETECTED Final   Carbapenem resistance NDM NOT DETECTED NOT DETECTED Final   Carbapenem resist OXA 48 LIKE NOT DETECTED NOT DETECTED Final   Carbapenem resistance VIM NOT DETECTED NOT DETECTED Final    Comment: Performed at [facility], [address] [place], [place] [address]  Culture, blood (Routine X 2) w Reflex to ID Panel     Status: None (Preliminary result)   Collection Time: 03/13/24  9:24 AM   Specimen: BLOOD  Result Value Ref Range Status   Specimen Description BLOOD BLOOD RIGHT HAND  Final   Special Requests   Final    BOTTLES DRAWN AEROBIC AND ANAEROBIC Blood Culture adequate volume   Culture   Final    NO GROWTH < 24 HOURS Performed at [facility], [address] [place], [place] [address]    Report Status PENDING  Incomplete  Culture, blood (Routine X 2) w Reflex to ID Panel     Status: None (Preliminary result)   Collection Time: 03/13/24  9:24 AM   Specimen: BLOOD  Result Value Ref Range Status   Specimen Description BLOOD LEFT ANTECUBITAL  Final   Special Requests   Final    BOTTLES DRAWN AEROBIC AND ANAEROBIC Blood Culture results may not be optimal due to an inadequate volume of blood received in culture bottles   Culture   Final    NO GROWTH < 24 HOURS Performed at [facility], [address], [place], [place] [address]    Report Status PENDING  Incomplete    Scheduled Meds:  apixaban   5 mg Oral BID   atorvastatin   80 mg Oral QHS   Chlorhexidine  Gluconate Cloth  6 each Topical Daily   cyclobenzaprine   10 mg Oral Q12H   gabapentin   300 mg Oral TID   insulin  aspart  0-5 Units Subcutaneous QHS   insulin  aspart  0-9 Units Subcutaneous TID WC   insulin  aspart  6 Units Subcutaneous TID WC   insulin  glargine-yfgn  42 Units Subcutaneous Daily   mirabegron  ER  25 mg Oral QHS   pantoprazole   40 mg Oral  Daily   sertraline   50 mg Oral Daily   sodium chloride  flush  10-40 mL Intracatheter Q12H   sodium chloride  flush  10-40 mL Intracatheter Q12H   sodium chloride  flush  10-40 mL Intracatheter Q12H   tamsulosin   0.4 mg Oral Daily   vancomycin   125 mg Oral Q12H   Continuous Infusions:  ertapenem  1 g (03/14/24 1037)     LOS: 3 days   Rendall Carwin, MD [facility]  If 7PM-7AM, please contact night-coverage [email or web address] 03/14/2024, 6:41 PM

## 2024-03-15 DIAGNOSIS — N390 Urinary tract infection, site not specified: Secondary | ICD-10-CM | POA: Diagnosis not present

## 2024-03-15 DIAGNOSIS — A4150 Gram-negative sepsis, unspecified: Secondary | ICD-10-CM | POA: Diagnosis not present

## 2024-03-15 LAB — GLUCOSE, CAPILLARY
Glucose-Capillary: 134 mg/dL — ABNORMAL HIGH (ref 70–99)
Glucose-Capillary: 144 mg/dL — ABNORMAL HIGH (ref 70–99)

## 2024-03-15 MED ORDER — ACETAMINOPHEN 325 MG PO TABS: 650.0000 mg | ORAL_TABLET | Freq: Four times a day (QID) | ORAL | Status: DC | PRN

## 2024-03-15 MED ORDER — TAMSULOSIN HCL 0.4 MG PO CAPS: 0.4000 mg | ORAL_CAPSULE | Freq: Every day | ORAL | 0 refills | Status: DC

## 2024-03-15 MED ORDER — VANCOMYCIN HCL 125 MG PO CAPS: 125.0000 mg | ORAL_CAPSULE | Freq: Two times a day (BID) | ORAL | 0 refills | Status: AC

## 2024-03-15 NOTE — Plan of Care (Signed)
  Problem: Education: Goal: Knowledge of General Education information will improve Description: Including pain rating scale, medication(s)/side effects and non-pharmacologic comfort measures Outcome: Progressing   Problem: Health Behavior/Discharge Planning: Goal: Ability to manage health-related needs will improve Outcome: Progressing   Problem: Coping: Goal: Level of anxiety will decrease Outcome: Progressing   Problem: Nutrition: Goal: Adequate nutrition will be maintained Outcome: Progressing   Problem: Elimination: Goal: Will not experience complications related to bowel motility Outcome: Progressing Goal: Will not experience complications related to urinary retention Outcome: Progressing   Problem: Pain Managment: Goal: General experience of comfort will improve and/or be controlled Outcome: Progressing   Problem: Safety: Goal: Ability to remain free from injury will improve Outcome: Progressing   Problem: Skin Integrity: Goal: Risk for impaired skin integrity will decrease Outcome: Progressing   Problem: Coping: Goal: Ability to adjust to condition or change in health will improve Outcome: Progressing

## 2024-03-15 NOTE — Progress Notes (Signed)
 Mobility Specialist Progress Note:    03/15/24 0942  Mobility  Activity Ambulated with assistance  Level of Assistance Contact guard assist, steadying assist  Assistive Device Front wheel walker  Distance Ambulated (ft) 10 ft  Range of Motion/Exercises Active;All extremities  Activity Response Tolerated well  Mobility Referral Yes  Mobility visit 1 Mobility  Mobility Specialist Start Time (ACUTE ONLY) 0930  Mobility Specialist Stop Time (ACUTE ONLY) [id number]  Mobility Specialist Time Calculation (min) (ACUTE ONLY) 12 min   Pt received requesting assistance. Required CGA to stand and ambulate with RW. Tolerated well, asx throughout. Left pt in bathroom, NT in room to assist pt back to bed when finished. All needs met.   Sherrilee Ditty Mobility Specialist Please contact via Special educational needs teacher or  Rehab office at [phone]

## 2024-03-15 NOTE — Discharge Summary (Signed)
 Amy Livingston, is a 68 y.o. female  DOB 1956/06/29  MRN [id number].  Admission date:  03/11/2024  Admitting Physician  Tully FORBES Carwin, MD  Discharge Date:  03/15/2024   Primary MD  Zarwolo, Gloria, FNP  Recommendations for primary care physician for things to follow:  -IV Invanz  and oral vancomycin  as prescribed -Outpatient follow-up with PCP in 1 to 2 weeks for recheck  Admission Diagnosis  Complicated UTI (urinary tract infection) [N39.0] UTI due to Klebsiella species [N39.0, B96.89] Sepsis with encephalopathy without septic shock, due to unspecified organism (HCC) [A41.9, R65.20, G93.41]   Discharge Diagnosis  Complicated UTI (urinary tract infection) [N39.0] UTI due to Klebsiella species [N39.0, B96.89] Sepsis with encephalopathy without septic shock, due to unspecified organism (HCC) [A41.9, R65.20, G93.41]    Principal Problem:   Sepsis due to gram-negative UTI (HCC) Active Problems:   Hypokalemia   Hyponatremia   Hypomagnesemia   Severe sepsis (HCC)   AKI (acute kidney injury) (HCC)   Diabetes 1.5, managed as type 2 (HCC)   PAF (paroxysmal atrial fibrillation) (HCC)   Generalized anxiety disorder   Essential hypertension      Past Medical History:  Diagnosis Date   Anxiety    Atrial fibrillation (HCC)    Diabetes mellitus without complication (HCC)    Dysrhythmia    GERD (gastroesophageal reflux disease)    Hypertension    PONV (postoperative nausea and vomiting)    Shortness of breath dyspnea     Past Surgical History:  Procedure Laterality Date   ABDOMINAL HYSTERECTOMY     ANTERIOR CERVICAL DECOMP/DISCECTOMY FUSION N/A 01/13/2016   Procedure: C5-6 ANTERIOR CERVICAL DECOMPRESSION/DISKECTOMY/FUSION;  Surgeon: Victory Gens, MD;  Location: [facility];  Service: Neurosurgery;  Laterality: N/A;  C5-6 ANTERIOR CERVICAL DECOMPRESSION/DISKECTOMY/FUSION   BACK SURGERY     x 5    BIOPSY  07/25/2023   Procedure: BIOPSY;  Surgeon: Cindie Carlin POUR, DO;  Location: [facility];  Service: Endoscopy;;   CHOLECYSTECTOMY     COLONOSCOPY WITH PROPOFOL  N/A 07/25/2023   Procedure: COLONOSCOPY WITH PROPOFOL ;  Surgeon: Cindie Carlin POUR, DO;  Location: [facility];  Service: Endoscopy;  Laterality: N/A;  10:45 am, asa 3   ESOPHAGEAL BRUSHING  07/25/2023   Procedure: ESOPHAGEAL BRUSHING;  Surgeon: Cindie Carlin POUR, DO;  [facility]: [facility];  Service: Endoscopy;;   ESOPHAGOGASTRODUODENOSCOPY (EGD) WITH PROPOFOL  N/A 07/25/2023   Procedure: ESOPHAGOGASTRODUODENOSCOPY (EGD) WITH PROPOFOL ;  Surgeon: Cindie Carlin POUR, DO;  [facility]: [facility];  Service: Endoscopy;  Laterality: N/A;   FOOT SURGERY     KNEE ARTHROSCOPY     left   KNEE SURGERY Right    POLYPECTOMY  07/25/2023   Procedure: POLYPECTOMY INTESTINAL;  Surgeon: Cindie Carlin POUR, DO;  [facility]: [facility];  Service: Endoscopy;;   TEE WITHOUT CARDIOVERSION N/A 10/13/2023   Procedure: ECHOCARDIOGRAM, TRANSESOPHAGEAL;  Surgeon: Stacia Diannah SQUIBB, MD;  Location: [facility];  Service: Cardiovascular;  Laterality: N/A;   TUBAL LIGATION       HPI  from  the history and physical done on the day of admission:   Patient coming from: Home   I have personally briefly reviewed patient's old medical records in [facility]   Chief Complaint: Urinary symptoms, Diarrhea   HPI: Amy Livingston is a 68 y.o. female with medical history significant for diabetes mellitus, hypertension, paroxysmal atrial fibrillation, anxiety and depression. Patient presented to the ED with complaints of weakness, pain with urination and urinary frequency.  Patient went to urgent care 7/29 with urinary symptoms, was diagnosed with UTI, she was discharged on Ceftin .  Urine cultures obtained grew E. coli and multidrug resistant Klebsiella.  Attempts were made to reach patient, but patient said she felt ill and was not checking her phone. She  reports a fever of 103 at home.  Reports onset of watery stools over the past 2 days, about 3 times a day.  No vomiting.  No abdominal pain.  She says her symptoms reminded of her previous episode of C. difficile infection.   Patient reports for the past week she has intermittently been seeing black spots or cartoon characters, weird things that are not actually.  She is not having this currently. No confused speech or alteration in consciousness.   ED Course: T-max- 99.  Heart rate 93-104.  Respiratory rate 16-21.  Blood pressure systolic 116-133.  O2 sat greater than 94% on room air. WBC 18. Potassium 2.7. Sodium 131. Creatinine elevated 1.17. UA suggestive of UTI. Blood Cultures obtained. IV Zosyn  started.     Review of Systems: As per HPI all other systems reviewed and negative.     Hospital Course:   Assessment and Plan: Sepsis due to Klebsiella and E. coli UTI (HCC) with Klebsiella bacteremia -Continue on Invanz  - Midline placed on 03/14/24 - Repeat blood cultures from 03/13/2024 NGTD -Okay to discharge home on IV ertapenem  to complete a total of 14 days of antibiotic therapy OPAT and follow-up orders as advised   AKI (acute kidney injury) (HCC)-resolved Creatinine 1.17, baseline 0.6.  In the setting of severe sepsis secondary to UTI, diarrhea. 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  Replaced   Generalized anxiety  disorder/depressive disorder C/n buspirone , Zoloft  and trazodone  nightly   PAF (paroxysmal atrial fibrillation) (HCC) Heart rate 93-104. - Continue Eliquis  for stroke prophylaxis   Diabetes 1.5, managed as type 2 (HCC) with mild hyperglycemia - HGbA1c 8.9% - Resume PTA diabetic regimen and follow-up with PCP for adjustments   Obesity, class I -Low calorie diet, portion control and increase physical activity discussed with patient -Body mass index is 33.52 kg/m.  Chronic anemia--Hgb stable around 8 - Monitor closely while on Eliquis  - No bleeding concerns  Discharge Condition: Stable  Follow UP   Follow-up Information     [facility]  Follow up.   Why: Will contact you to schedule home health visits. Contact information: [address] [place] [address] [phone]  [facility] Follow up.   Why: For IV antibiotics        Zarwolo, Gloria, FNP. Schedule an appointment as soon as possible for a visit in 1 week(s).   Specialty: Family Medicine Contact information: [address] [place] [address] [phone]                 Diet and Activity recommendation:  As advised  Discharge Instructions    Discharge Instructions     [facility] pharmacist to adjust dose for Vancomycin , Aminoglycosides and other anti-infective therapies as requested by physician.   Complete by: As directed    [facility] to provide Cath Flo 2mg    Complete by: As directed    Administer for PICC line occlusion and as ordered by physician for other access device issues.   Anaphylaxis Kit: Provided to treat any anaphylactic reaction to the medication being provided to the patient if First Dose or when requested by physician   Complete by: As directed    Epinephrine  1mg /ml vial / amp: Administer 0.3mg  (0.56ml) subcutaneously once for moderate to severe anaphylaxis, nurse to call physician and pharmacy when reaction occurs  and call 911 if needed for immediate care   Diphenhydramine 50mg /ml IV vial: Administer 25-50mg  IV/IM PRN for first dose reaction, rash, itching, mild reaction, nurse to call physician and pharmacy when reaction occurs   Sodium Chloride  0.9% NS 500ml IV: Administer if needed for hypovolemic blood pressure drop or as ordered by physician after call to physician with anaphylactic reaction   Call MD for:  difficulty breathing, headache or visual disturbances   Complete by: As directed    Call MD for:  persistant dizziness or light-headedness   Complete by: As directed    Call MD for:  persistant nausea and vomiting   Complete by: As directed    Call MD for:  temperature >100.4   Complete by: As directed    Change dressing on IV access line weekly and PRN   Complete by: As directed    Diet - low sodium heart healthy   Complete by: As directed    Discharge instructions   Complete by: As directed    -IV Invanz  and oral vancomycin  as prescribed -Outpatient follow-up with PCP in 1 to 2 weeks for recheck   Flush IV access with Sodium Chloride  0.9% and Heparin 10 units/ml or 100 units/ml   Complete by: As directed    Home infusion instructions - [facility]   Complete by: As directed    Instructions: Flush IV access with Sodium Chloride  0.9% and Heparin 10units/ml or 100units/ml   Change dressing on IV access line: Weekly and PRN   Instructions Cath Flo 2mg : Administer for PICC Line occlusion and as ordered by physician for other access device   [facility] pharmacist to adjust dose for: Vancomycin , Aminoglycosides and other anti-infective therapies as requested by physician   Increase activity slowly   Complete by: As directed    Method of administration may be changed at the discretion of home infusion pharmacist based upon assessment of the patient and/or caregiver's ability to self-administer the medication ordered   Complete by: As directed    No wound care   Complete  by: As directed        Discharge Medications     Allergies as of 03/15/2024       Reactions   Elemental Sulfur Itching, Rash   Yeast infections   Metformin  And  Related Diarrhea   GI distress        Medication List     STOP taking these medications    cefUROXime  500 MG tablet Commonly known as: CEFTIN        TAKE these medications    Accu-Chek Guide Test test strip Generic drug: glucose blood USE TO TEST BLOOD SUGAR EVERY MORNING, AT NOON, AND EVERY NIGHT AT BEDTIME.   acetaminophen  325 MG tablet Commonly known as: TYLENOL  Take 2 tablets (650 mg total) by mouth every 6 (six) hours as needed for mild pain (pain score 1-3) or fever (or Fever >/= 101).   atorvastatin  80 MG tablet Commonly known as: LIPITOR TAKE 1 TABLET(80 MG) BY MOUTH DAILY   busPIRone  10 MG tablet Commonly known as: BUSPAR  Take 1 tablet (10 mg total) by mouth 2 (two) times daily. What changed: when to take this   cyclobenzaprine  10 MG tablet Commonly known as: FLEXERIL  Take 10 mg by mouth every 12 (twelve) hours.   Eliquis  5 MG Tabs tablet Generic drug: apixaban  Take 5 mg by mouth 2 (two) times daily.   ertapenem  IVPB Commonly known as: INVANZ  Inject 1 g into the vein daily for 10 days. Indication:  ESBL Klebsiella UTI First Dose: Yes Last Day of Therapy:  03/23/24 MIDLINE /PIC Care Per Protocol:   Labs weekly while on IV antibiotics: _x_ CBC with differential   _x_ CMP     _x_ Please pull PIC at completion of IV antibiotics     Fax weekly lab results  promptly to [phone]  Method of administration: Mini-Bag Plus / Gravity Method of administration may be changed at the discretion of home infusion pharmacist based upon assessment of the patient and/or caregiver's ability to self-administer the medication ordered.   famotidine  20 MG tablet Commonly known as: PEPCID  Take 1 tablet (20 mg total) by mouth daily.   gabapentin  300 MG capsule Commonly known as: NEURONTIN  TAKE  1 CAPSULE(300 MG) BY MOUTH THREE TIMES DAILY   meclizine  25 MG tablet Commonly known as: ANTIVERT  Take 1 tablet (25 mg total) by mouth daily as needed for dizziness.   mirabegron  ER 25 MG Tb24 tablet Commonly known as: Myrbetriq  Take 1 tablet (25 mg total) by mouth at bedtime.   mometasone  0.1 % cream Commonly known as: ELOCON  Apply topically daily as needed for itch   Mounjaro  12.5 MG/0.5ML Pen Generic drug: tirzepatide  Inject 12.5 mg into the skin once a week.   NovoLIN  70/30 Kwikpen (70-30) 100 UNIT/ML KwikPen Generic drug: insulin  isophane & regular human KwikPen Inject 30 Units into the skin 2 (two) times daily with a meal.   omeprazole  40 MG capsule Commonly known as: PRILOSEC Take 1 capsule (40 mg total) by mouth daily.   ondansetron  4 MG tablet Commonly known as: ZOFRAN  Take 1 tablet (4 mg total) by mouth every 6 (six) hours as needed for nausea.   oxyCODONE -acetaminophen  10-325 MG tablet Commonly known as: PERCOCET Take 1 tablet by mouth every 8 (eight) hours as needed for pain.   sertraline  50 MG tablet Commonly known as: ZOLOFT  TAKE 1 TABLET(50 MG) BY MOUTH DAILY   sucralfate  1 g tablet Commonly known as: Carafate  Take 1 tablet (1 g total) by mouth 4 (four) times daily -  with meals and at bedtime.   tamsulosin  0.4 MG Caps capsule Commonly known as: FLOMAX  Take 1 capsule (0.4 mg total) by mouth daily after supper.   traZODone  100 MG tablet Commonly known as: DESYREL  Take 1 tablet (  100 mg total) by mouth at bedtime.   vancomycin  125 MG capsule Commonly known as: VANCOCIN  Take 1 capsule (125 mg total) by mouth every 12 (twelve) hours for 11 days.   Vitamin D -3 25 MCG (1000 UT) Caps Take 1,000 Units by mouth 2 (two) times daily at 10 am and 4 pm.               Durable Medical Equipment  (From admission, onward)           Start     Ordered   03/12/24 1138  For home use only DME 3 n 1  Once        03/12/24 1137               Discharge Care Instructions  (From admission, onward)           Start     Ordered   03/13/24 0000  Change dressing on IV access line weekly and PRN  (Home infusion instructions - [facility] )        03/13/24 1532           Major procedures and Radiology Reports - PLEASE review detailed and final reports for all details, in brief -   US  RENAL Result Date: 03/12/2024 CLINICAL DATA:  Bacteremia. EXAM: RENAL / URINARY TRACT ULTRASOUND COMPLETE COMPARISON:  CT 10/08/2023 FINDINGS: Right Kidney: Renal measurements: 13.8 x 5.5 x 6.4 cm = volume: 253 mL. Fullness of the renal collecting system. Proximal ureter is mildly dilated. Similar findings were seen on prior CT. Normal parenchymal echogenicity. No evidence of renal fluid collection, stone or focal lesion. Left Kidney: Renal measurements: 14.9 x 7.6 x 7.9 cm = volume: 465 mL. Moderate hydronephrosis. Punctate stone on prior CT is not seen by ultrasound. No evidence of focal lesion. No renal fluid collection. Bladder: Diffuse bladder wall thickening. Layering debris. Postvoid residual 167 cc. Other: None. IMPRESSION: 1. Moderate left hydronephrosis. Mild fullness of the right renal collecting system. Similar findings were seen on prior CT. 2. Diffuse bladder wall thickening. Layering debris in the bladder. Findings can be seen with cystitis. 3. Postvoid residual of 567 cc Electronically Signed   By: Andrea Gasman M.D.   On: 03/12/2024 18:17   Micro Results   Recent Results (from the past 240 hours)  Urine Culture     Status: Abnormal   Collection Time: 03/06/24  2:00 PM   Specimen: Urine, Clean Catch  Result Value Ref Range Status   Specimen Description   Final    URINE, CLEAN CATCH Performed at [facility], [address] [place], [place] [address]    Special Requests   Final    NONE Performed at [facility], [address], [place], [place] [address]    Culture (A)  Final    50,000 COLONIES/mL ESCHERICHIA  COLI >=100,000 COLONIES/mL KLEBSIELLA PNEUMONIAE Confirmed Extended Spectrum Beta-Lactamase Producer (ESBL).  In bloodstream infections from ESBL organisms, carbapenems are preferred over piperacillin /tazobactam. They are shown to have a lower risk of mortality.    Report Status 03/09/2024 FINAL  Final   Organism ID, Bacteria ESCHERICHIA COLI (A)  Final   Organism ID, Bacteria KLEBSIELLA PNEUMONIAE (A)  Final      Susceptibility   Escherichia coli - MIC*    AMPICILLIN 4 SENSITIVE Sensitive     CEFAZOLIN  <=4 SENSITIVE Sensitive     CEFEPIME  <=0.12 SENSITIVE Sensitive     CEFTRIAXONE  <=0.25 SENSITIVE Sensitive     CIPROFLOXACIN >=4  RESISTANT Resistant     GENTAMICIN <=1 SENSITIVE Sensitive     IMIPENEM <=0.25 SENSITIVE Sensitive     NITROFURANTOIN  <=16 SENSITIVE Sensitive     TRIMETH/SULFA <=20 SENSITIVE Sensitive     AMPICILLIN/SULBACTAM <=2 SENSITIVE Sensitive     PIP/TAZO <=4 SENSITIVE Sensitive ug/mL    * 50,000 COLONIES/mL ESCHERICHIA COLI   Klebsiella pneumoniae - MIC*    AMPICILLIN >=32 RESISTANT Resistant     CEFAZOLIN  >=64 RESISTANT Resistant     CEFEPIME  >=32 RESISTANT Resistant     CEFTRIAXONE  >=64 RESISTANT Resistant     CIPROFLOXACIN 1 RESISTANT Resistant     GENTAMICIN >=16 RESISTANT Resistant     IMIPENEM <=0.25 SENSITIVE Sensitive     NITROFURANTOIN  128 RESISTANT Resistant     TRIMETH/SULFA >=320 RESISTANT Resistant     AMPICILLIN/SULBACTAM >=32 RESISTANT Resistant     PIP/TAZO <=4 SENSITIVE Sensitive ug/mL    * >=100,000 COLONIES/mL KLEBSIELLA PNEUMONIAE  Blood culture (routine x 2)     Status: Abnormal   Collection Time: 03/11/24  2:49 PM   Specimen: Right Antecubital; Blood  Result Value Ref Range Status   Specimen Description   Final    RIGHT ANTECUBITAL BOTTLES DRAWN AEROBIC AND ANAEROBIC Performed at [facility], [address], [place], [place] [address]    Special Requests   Final    Blood Culture adequate volume Performed at [facility], [address], [place], [place] [address]    Culture  Setup Time   Final    GRAM NEGATIVE RODS IN BOTH AEROBIC AND ANAEROBIC BOTTLES Gram Stain Report Called to,Read Back By and Verified With: L FLORES AT 0826 ON [id number] BY S DALTON Organism ID to follow GRAM STAIN REVIEWED-AGREE WITH RESULT DRT CRITICAL RESULT CALLED TO, READ BACK BY AND VERIFIED WITH: PHARMD STEVEN HURTH ON 03/12/24 @ 1410 BY DRT Performed at [facility], [address] [place], [place] [address]    Culture (A)  Final    KLEBSIELLA PNEUMONIAE Confirmed Extended Spectrum Beta-Lactamase Producer (ESBL).  In bloodstream infections from ESBL organisms, carbapenems are preferred over piperacillin /tazobactam. They are shown to have a lower risk of mortality.    Report Status 03/14/2024 FINAL  Final   Organism ID, Bacteria KLEBSIELLA PNEUMONIAE  Final      Susceptibility   Klebsiella pneumoniae - MIC*    AMPICILLIN >=32 RESISTANT Resistant     CEFEPIME  >=32 RESISTANT Resistant     CEFTAZIDIME RESISTANT Resistant     CEFTRIAXONE  >=64 RESISTANT Resistant     CIPROFLOXACIN 1 RESISTANT Resistant     GENTAMICIN >=16 RESISTANT Resistant     IMIPENEM <=0.25 SENSITIVE Sensitive     TRIMETH/SULFA >=320 RESISTANT Resistant     AMPICILLIN/SULBACTAM >=32 RESISTANT Resistant     PIP/TAZO 16 SENSITIVE Sensitive ug/mL    * KLEBSIELLA PNEUMONIAE  Blood culture (routine x 2)     Status: Abnormal   Collection Time: 03/11/24  2:49 PM   Specimen: BLOOD LEFT FOREARM  Result Value Ref Range Status   Specimen Description   Final    BLOOD LEFT FOREARM BOTTLES DRAWN AEROBIC AND ANAEROBIC Performed at [facility], [address], [place], [place] [address]    Special Requests   Final    Blood Culture adequate volume Performed at [facility], [address], [place], [place] [address]    Culture  Setup Time   Final    GRAM NEGATIVE RODS ANAEROBIC BOTTLE ONLY Gram Stain Report Called to,Read Back By and  Verified With: L FLORES AT 0826 ON  [id number] BY S DALTON GRAM STAIN REVIEWED-AGREE WITH RESULT DRT    Culture (A)  Final    KLEBSIELLA PNEUMONIAE SUSCEPTIBILITIES PERFORMED ON PREVIOUS CULTURE WITHIN THE LAST 5 DAYS. Performed at [facility], [address], [place], [place] [address]    Report Status 03/14/2024 FINAL  Final  Blood Culture ID Panel (Reflexed)     Status: Abnormal   Collection Time: 03/11/24  2:49 PM  Result Value Ref Range Status   Enterococcus faecalis NOT DETECTED NOT DETECTED Final   Enterococcus Faecium NOT DETECTED NOT DETECTED Final   Listeria monocytogenes NOT DETECTED NOT DETECTED Final   Staphylococcus species NOT DETECTED NOT DETECTED Final   Staphylococcus aureus (BCID) NOT DETECTED NOT DETECTED Final   Staphylococcus epidermidis NOT DETECTED NOT DETECTED Final   Staphylococcus lugdunensis NOT DETECTED NOT DETECTED Final   Streptococcus species NOT DETECTED NOT DETECTED Final   Streptococcus agalactiae NOT DETECTED NOT DETECTED Final   Streptococcus pneumoniae NOT DETECTED NOT DETECTED Final   Streptococcus pyogenes NOT DETECTED NOT DETECTED Final   A.calcoaceticus-baumannii NOT DETECTED NOT DETECTED Final   Bacteroides fragilis NOT DETECTED NOT DETECTED Final   Enterobacterales DETECTED (A) NOT DETECTED Final    Comment: Enterobacterales represent a large order of gram negative bacteria, not a single organism. CRITICAL RESULT CALLED TO, READ BACK BY AND VERIFIED WITH: PHARMD STEVEN HURTH ON 03/12/24 @ 1410 BY DRT    Enterobacter cloacae complex NOT DETECTED NOT DETECTED Final   Escherichia coli NOT DETECTED NOT DETECTED Final   Klebsiella aerogenes NOT DETECTED NOT DETECTED Final   Klebsiella oxytoca NOT DETECTED NOT DETECTED Final   Klebsiella pneumoniae DETECTED (A) NOT DETECTED Final    Comment: CRITICAL RESULT CALLED TO, READ BACK BY AND VERIFIED WITH: PHARMD STEVEN HURTH ON 03/12/24 @ 1410 BY DRT    Proteus species NOT DETECTED NOT DETECTED Final   Salmonella species NOT  DETECTED NOT DETECTED Final   Serratia marcescens NOT DETECTED NOT DETECTED Final   Haemophilus influenzae NOT DETECTED NOT DETECTED Final   Neisseria meningitidis NOT DETECTED NOT DETECTED Final   Pseudomonas aeruginosa NOT DETECTED NOT DETECTED Final   Stenotrophomonas maltophilia NOT DETECTED NOT DETECTED Final   Candida albicans NOT DETECTED NOT DETECTED Final   Candida auris NOT DETECTED NOT DETECTED Final   Candida glabrata NOT DETECTED NOT DETECTED Final   Candida krusei NOT DETECTED NOT DETECTED Final   Candida parapsilosis NOT DETECTED NOT DETECTED Final   Candida tropicalis NOT DETECTED NOT DETECTED Final   Cryptococcus neoformans/gattii NOT DETECTED NOT DETECTED Final   CTX-M ESBL DETECTED (A) NOT DETECTED Final    Comment: CRITICAL RESULT CALLED TO, READ BACK BY AND VERIFIED WITH: PHARMD STEVEN HURTH ON 03/12/24 @ 1410 BY DRT (NOTE) Extended spectrum beta-lactamase detected. Recommend a carbapenem as initial therapy.      Carbapenem resistance IMP NOT DETECTED NOT DETECTED Final   Carbapenem resistance KPC NOT DETECTED NOT DETECTED Final   Carbapenem resistance NDM NOT DETECTED NOT DETECTED Final   Carbapenem resist OXA 48 LIKE NOT DETECTED NOT DETECTED Final   Carbapenem resistance VIM NOT DETECTED NOT DETECTED Final    Comment: Performed at [facility], [address], [place], [place] [address]  Culture, blood (Routine X 2) w Reflex to ID Panel     Status: None (Preliminary result)   Collection Time: 03/13/24  9:24 AM   Specimen: BLOOD  Result Value Ref Range Status  Specimen Description BLOOD BLOOD RIGHT HAND  Final   Special Requests   Final    BOTTLES DRAWN AEROBIC AND ANAEROBIC Blood Culture adequate volume   Culture   Final    NO GROWTH 2 DAYS Performed at [facility], [address] [place], [place] [address]    Report Status PENDING  Incomplete  Culture, blood (Routine X 2) w Reflex to ID Panel     Status: None (Preliminary result)   Collection  Time: 03/13/24  9:24 AM   Specimen: BLOOD  Result Value Ref Range Status   Specimen Description BLOOD LEFT ANTECUBITAL  Final   Special Requests   Final    BOTTLES DRAWN AEROBIC AND ANAEROBIC Blood Culture results may not be optimal due to an inadequate volume of blood received in culture bottles   Culture   Final    NO GROWTH 2 DAYS Performed at [facility], [address], [place], [place] [address]    Report Status PENDING  Incomplete    Today   Subjective    Earnestine Shipp today has no new complaints         Foley removed and patient voiding well post removal of Foley, no hematuria -No fever  Or chills   No Nausea, Vomiting or Diarrhea   Patient has been seen and examined prior to discharge   Objective   Blood pressure (!) 115/52, pulse 99, temperature 98.4 F (36.9 C), temperature source Oral, resp. rate 16, height 5' 6 (1.676 m), weight 94.2 kg, SpO2 97%.   Intake/Output Summary (Last 24 hours) at 03/15/2024 1605 Last data filed at 03/15/2024 1349 Gross per 24 hour  Intake 580.7 ml  Output 6445 ml  Net -5864.3 ml   Exam Gen:- Awake Alert, no acute distress  HEENT:- [place].AT, No sclera icterus Neck-Supple Neck,No JVD,.  Lungs-  CTAB , good air movement bilaterally CV- S1, S2 normal, regular Abd-  +ve B.Sounds, Abd Soft, No tenderness, no CVA area tenderness Extremity/Skin:- No  edema,   good pulses Psych-affect is appropriate, oriented x3 Neuro-no new focal deficits, no tremors  GU--Foley removed and patient voiding well post removal of Foley, no hematuria   Data Review   CBC w Diff:  Lab Results  Component Value Date   WBC 10.9 (H) 03/14/2024   HGB 7.8 (L) 03/14/2024   HGB 13.7 06/01/2023   HCT 25.1 (L) 03/14/2024   HCT 43.6 06/01/2023   PLT 339 03/14/2024   PLT 193 06/01/2023   LYMPHOPCT 12 03/11/2024   MONOPCT 7 03/11/2024   EOSPCT 1 03/11/2024   BASOPCT 0 03/11/2024    CMP:  Lab Results  Component Value Date   NA 137 03/14/2024   NA 138  06/01/2023   K 4.4 03/14/2024   CL 100 03/14/2024   CO2 26 03/14/2024   BUN 12 03/14/2024   BUN 14 06/01/2023   CREATININE 0.81 03/14/2024   PROT 5.6 (L) 03/11/2024   PROT 6.0 06/01/2023   ALBUMIN 2.2 (L) 03/11/2024   ALBUMIN 4.0 06/01/2023   BILITOT 0.6 03/11/2024   BILITOT 0.5 06/01/2023   ALKPHOS 147 (H) 03/11/2024   AST 18 03/11/2024   ALT 11 03/11/2024  .  Total Discharge time is about 33 minutes  Rendall Carwin M.D on 03/15/2024 at 4:05 PM  Go to [email or web address] -  for contact info  [facility] - Office  [phone]

## 2024-03-15 NOTE — Discharge Instructions (Signed)
-  IV Invanz  and oral vancomycin  as prescribed -Outpatient follow-up with PCP in 1 to 2 weeks for recheck

## 2024-03-15 NOTE — TOC Transition Note (Signed)
 Transition of Care [facility]) - Discharge Note   Patient Details  Name: Amy Livingston MRN: [id number] Date of Birth: [birth year]  Transition of Care [facility]) CM/SW Contact:  Amy DELENA Bigness, LCSW Phone Number: 03/15/2024, 10:36 AM   Clinical Narrative:    Pt returning home on IV abx until 8/15. [facility] providing home IV abx and needed supplies. [facility] following to provide HHPT/OT/RN. HH orders are in place. No further TOC needs identified. TOC signing off.    Final next level of care: Home w Home Health Services Barriers to Discharge: Barriers Resolved   Patient Goals and CMS Choice Patient states their goals for this hospitalization and ongoing recovery are:: To return home CMS Medicare.gov Compare Post Acute Care list provided to:: Patient Choice offered to / list presented to : Patient      Discharge Placement                       Discharge Plan and Services Additional resources added to the After Visit Summary for                  DME Arranged: IV pump/equipment DME Agency: [facility] Date DME Agency Contacted: 03/12/24 Time DME Agency Contacted: [phone] Representative spoke with at DME Agency: Darlyn HH Arranged: PT, OT, RN [facility] Agency: [facility]) Date HH Agency Contacted: 03/13/24 Time HH Agency Contacted: [phone] Representative spoke with at [facility] Agency: [facility]  Social Drivers of Health (SDOH) Interventions SDOH Screenings   Food Insecurity: No Food Insecurity (03/11/2024)  Housing: Low Risk  (03/11/2024)  Transportation Needs: No Transportation Needs (03/11/2024)  Utilities: Not At Risk (03/11/2024)  Alcohol Screen: Low Risk  (09/28/2023)  Depression (PHQ2-9): Low Risk  (12/07/2023)  Financial Resource Strain: Low Risk  (10/12/2023)  Physical Activity: Inactive (10/12/2023)  Social Connections: Socially Isolated (03/11/2024)  Stress: Stress Concern Present (10/12/2023)  Tobacco Use: Low Risk  (03/11/2024)  Health Literacy: Adequate Health Literacy  (09/28/2023)     Readmission Risk Interventions    03/15/2024   10:34 AM  Readmission Risk Prevention Plan  Transportation Screening Complete  PCP or Specialist Appt within 5-7 Days Complete  Home Care Screening Complete  Medication Review (RN CM) Complete

## 2024-03-15 NOTE — Care Management Important Message (Signed)
 Important Message  Patient Details  Name: Shabre Kreher MRN: [id number] Date of Birth: 06-Oct-1955   Important Message Given:  Yes - Medicare IM     Rosaire Cueto L Elester Apodaca 03/15/2024, 10:03 AM

## 2024-03-15 NOTE — Progress Notes (Signed)
   03/15/24 0950  What Happened  Was fall witnessed? Yes  Who witnessed fall? Harlene Pepper NT  Patients activity before fall ambulating-unassisted  Point of contact buttocks  Provider Notification  Provider Name/Title Courage Emokpae  Date Provider Notified 03/15/24  Time Provider Notified [phone]  Method of Notification  (secure chat)  Notification Reason Fall  Follow Up  Family notified No - patient refusal  Simple treatment Other (comment) (Vital signs)  Adult Fall Risk Assessment  Risk Factor Category (scoring not indicated) History of more than one fall within 6 months before admission (document High fall risk)  Patient Fall Risk Level High fall risk  Adult Fall Risk Interventions  Required Bundle Interventions *See Row Information* High fall risk - low, moderate, and high requirements implemented  Additional Interventions Use of appropriate toileting equipment (bedpan, BSC, etc.)  Fall intervention(s) refused/Patient educated regarding refusal Bed alarm;Nonskid socks;Open door if unsupervised;Yellow bracelet  Pain Assessment  Pain Scale 0-10  Pain Score 0  Neurological  Neuro (WDL) WDL  Musculoskeletal  Musculoskeletal (WDL) X  Assistive Device Front wheel walker  Generalized Weakness Yes  Integumentary  Integumentary (WDL) X  Skin Color Pale  Skin Condition Dry  Skin Integrity Intact  Skin Turgor Non-tenting   Patient was ambulating with assistance to bathroom using standard walker and lost balance, NT Harlene Pepper assisted patient to floor. No injury reported. VS obtained and stable. Patient refused family notification. Dr. Rendall notified through secure chat.

## 2024-03-15 NOTE — Plan of Care (Signed)
  Problem: Education: Goal: Knowledge of General Education information will improve Description: Including pain rating scale, medication(s)/side effects and non-pharmacologic comfort measures Outcome: Adequate for Discharge   Problem: Health Behavior/Discharge Planning: Goal: Ability to manage health-related needs will improve Outcome: Adequate for Discharge   Problem: Clinical Measurements: Goal: Ability to maintain clinical measurements within normal limits will improve Outcome: Adequate for Discharge Goal: Will remain free from infection Outcome: Adequate for Discharge Goal: Diagnostic test results will improve Outcome: Adequate for Discharge Goal: Respiratory complications will improve Outcome: Adequate for Discharge Goal: Cardiovascular complication will be avoided Outcome: Adequate for Discharge   Problem: Activity: Goal: Risk for activity intolerance will decrease Outcome: Adequate for Discharge   Problem: Nutrition: Goal: Adequate nutrition will be maintained Outcome: Adequate for Discharge   Problem: Coping: Goal: Level of anxiety will decrease Outcome: Adequate for Discharge   Problem: Elimination: Goal: Will not experience complications related to bowel motility Outcome: Adequate for Discharge Goal: Will not experience complications related to urinary retention Outcome: Adequate for Discharge   Problem: Pain Managment: Goal: General experience of comfort will improve and/or be controlled Outcome: Adequate for Discharge   Problem: Safety: Goal: Ability to remain free from injury will improve Outcome: Adequate for Discharge   Problem: Skin Integrity: Goal: Risk for impaired skin integrity will decrease Outcome: Adequate for Discharge   Problem: Education: Goal: Ability to describe self-care measures that may prevent or decrease complications (Diabetes Survival Skills Education) will improve Outcome: Adequate for Discharge Goal: Individualized Educational  Video(s) Outcome: Adequate for Discharge   Problem: Coping: Goal: Ability to adjust to condition or change in health will improve Outcome: Adequate for Discharge   Problem: Fluid Volume: Goal: Ability to maintain a balanced intake and output will improve Outcome: Adequate for Discharge   Problem: Health Behavior/Discharge Planning: Goal: Ability to identify and utilize available resources and services will improve Outcome: Adequate for Discharge Goal: Ability to manage health-related needs will improve Outcome: Adequate for Discharge   Problem: Metabolic: Goal: Ability to maintain appropriate glucose levels will improve Outcome: Adequate for Discharge   Problem: Nutritional: Goal: Maintenance of adequate nutrition will improve Outcome: Adequate for Discharge Goal: Progress toward achieving an optimal weight will improve Outcome: Adequate for Discharge   Problem: Skin Integrity: Goal: Risk for impaired skin integrity will decrease Outcome: Adequate for Discharge   Problem: Tissue Perfusion: Goal: Adequacy of tissue perfusion will improve Outcome: Adequate for Discharge   Problem: Acute Rehab OT Goals (only OT should resolve) Goal: Pt. Will Perform Grooming Outcome: Adequate for Discharge Goal: Pt. Will Perform Lower Body Dressing Outcome: Adequate for Discharge Goal: Pt. Will Transfer To Toilet Outcome: Adequate for Discharge Goal: Pt. Will Perform Toileting-Clothing Manipulation Outcome: Adequate for Discharge   Problem: Acute Rehab PT Goals(only PT should resolve) Goal: Pt Will Go Supine/Side To Sit Outcome: Adequate for Discharge Goal: Patient Will Transfer Sit To/From Stand Outcome: Adequate for Discharge Goal: Pt Will Transfer Bed To Chair/Chair To Bed Outcome: Adequate for Discharge Goal: Pt Will Ambulate Outcome: Adequate for Discharge

## 2024-03-15 NOTE — Progress Notes (Signed)
 Foley catheter removed at [facility]

## 2024-03-16 ENCOUNTER — Telehealth: Payer: Self-pay

## 2024-03-16 NOTE — Transitions of Care (Post Inpatient/ED Visit) (Signed)
   03/16/2024  Name: Brita Jurgensen MRN: [id number] DOB: 1955-10-13  Today's TOC FU Call Status: Today's TOC FU Call Status:: Unsuccessful Call (1st Attempt) Unsuccessful Call (1st Attempt) Date: 03/16/24  Attempted to reach the patient regarding the most recent Inpatient/ED visit.  Follow Up Plan: Additional outreach attempts will be made to reach the patient to complete the Transitions of Care (Post Inpatient/ED visit) call.   Kyston Gonce J. Markell Sciascia RN, MSN [facility], [facility] RN Care Manager Direct Dial: [phone]  Fax: [phone] Website: [email or web address]

## 2024-03-18 LAB — CULTURE, BLOOD (ROUTINE X 2)
Culture: NO GROWTH
Culture: NO GROWTH
Special Requests: ADEQUATE

## 2024-03-21 ENCOUNTER — Other Ambulatory Visit: Payer: Self-pay | Admitting: Family Medicine

## 2024-03-21 DIAGNOSIS — G4709 Other insomnia: Secondary | ICD-10-CM

## 2024-03-21 NOTE — Transitions of Care (Post Inpatient/ED Visit) (Unsigned)
 03/21/2024  Patient ID: Amy Livingston, female   DOB: [birth year], 68 y.o.   MRN: [id number]  [facility] RN CM noted on chart review for post discharge outreach attempt #2 that patient was discharged in OBS status. CCM program note from 11/2023 was noted unable to reach patient.   Bing Edison MSN, RN RN Case Sales executive Health  [facility] Office Hours M-F [phone] Direct Dial: [phone] Main Phone [phone]  Fax: [phone] [email or web address]

## 2024-03-22 ENCOUNTER — Ambulatory Visit (INDEPENDENT_AMBULATORY_CARE_PROVIDER_SITE_OTHER): Admitting: Family Medicine

## 2024-03-22 ENCOUNTER — Telehealth: Payer: Self-pay

## 2024-03-22 ENCOUNTER — Encounter: Payer: Self-pay | Admitting: Family Medicine

## 2024-03-22 ENCOUNTER — Ambulatory Visit: Attending: Infectious Diseases | Admitting: Infectious Diseases

## 2024-03-22 VITALS — BP 112/74 | HR 97 | Resp 16 | Ht 66.0 in | Wt 194.0 lb

## 2024-03-22 DIAGNOSIS — B961 Klebsiella pneumoniae [K. pneumoniae] as the cause of diseases classified elsewhere: Secondary | ICD-10-CM | POA: Diagnosis not present

## 2024-03-22 DIAGNOSIS — I480 Paroxysmal atrial fibrillation: Secondary | ICD-10-CM | POA: Diagnosis not present

## 2024-03-22 DIAGNOSIS — Z1612 Extended spectrum beta lactamase (ESBL) resistance: Secondary | ICD-10-CM | POA: Insufficient documentation

## 2024-03-22 DIAGNOSIS — R339 Retention of urine, unspecified: Secondary | ICD-10-CM | POA: Insufficient documentation

## 2024-03-22 DIAGNOSIS — E119 Type 2 diabetes mellitus without complications: Secondary | ICD-10-CM | POA: Insufficient documentation

## 2024-03-22 DIAGNOSIS — N390 Urinary tract infection, site not specified: Secondary | ICD-10-CM | POA: Insufficient documentation

## 2024-03-22 DIAGNOSIS — N39490 Overflow incontinence: Secondary | ICD-10-CM | POA: Insufficient documentation

## 2024-03-22 DIAGNOSIS — Z981 Arthrodesis status: Secondary | ICD-10-CM | POA: Diagnosis not present

## 2024-03-22 DIAGNOSIS — I10 Essential (primary) hypertension: Secondary | ICD-10-CM | POA: Insufficient documentation

## 2024-03-22 DIAGNOSIS — Z7901 Long term (current) use of anticoagulants: Secondary | ICD-10-CM | POA: Diagnosis not present

## 2024-03-22 DIAGNOSIS — G4709 Other insomnia: Secondary | ICD-10-CM

## 2024-03-22 DIAGNOSIS — Z87440 Personal history of urinary (tract) infections: Secondary | ICD-10-CM | POA: Insufficient documentation

## 2024-03-22 DIAGNOSIS — Z09 Encounter for follow-up examination after completed treatment for conditions other than malignant neoplasm: Secondary | ICD-10-CM | POA: Diagnosis present

## 2024-03-22 DIAGNOSIS — K219 Gastro-esophageal reflux disease without esophagitis: Secondary | ICD-10-CM | POA: Diagnosis not present

## 2024-03-22 DIAGNOSIS — B9620 Unspecified Escherichia coli [E. coli] as the cause of diseases classified elsewhere: Secondary | ICD-10-CM | POA: Insufficient documentation

## 2024-03-22 DIAGNOSIS — E1165 Type 2 diabetes mellitus with hyperglycemia: Secondary | ICD-10-CM | POA: Diagnosis not present

## 2024-03-22 DIAGNOSIS — N1339 Other hydronephrosis: Secondary | ICD-10-CM | POA: Insufficient documentation

## 2024-03-22 DIAGNOSIS — R7881 Bacteremia: Secondary | ICD-10-CM | POA: Diagnosis not present

## 2024-03-22 DIAGNOSIS — B9689 Other specified bacterial agents as the cause of diseases classified elsewhere: Secondary | ICD-10-CM

## 2024-03-22 DIAGNOSIS — R42 Dizziness and giddiness: Secondary | ICD-10-CM | POA: Diagnosis not present

## 2024-03-22 DIAGNOSIS — N320 Bladder-neck obstruction: Secondary | ICD-10-CM

## 2024-03-22 DIAGNOSIS — Z794 Long term (current) use of insulin: Secondary | ICD-10-CM

## 2024-03-22 MED ORDER — PANTOPRAZOLE SODIUM 40 MG PO TBEC: 40.0000 mg | DELAYED_RELEASE_TABLET | ORAL | 3 refills | Status: DC | PRN

## 2024-03-22 MED ORDER — NOVOLIN 70/30 FLEXPEN (70-30) 100 UNIT/ML ~~LOC~~ SUPN: PEN_INJECTOR | SUBCUTANEOUS | 5 refills | Status: DC

## 2024-03-22 MED ORDER — MECLIZINE HCL 25 MG PO TABS: 25.0000 mg | ORAL_TABLET | Freq: Every day | ORAL | 5 refills | Status: AC | PRN

## 2024-03-22 MED ORDER — TIRZEPATIDE 15 MG/0.5ML ~~LOC~~ SOAJ: 15.0000 mg | SUBCUTANEOUS | 1 refills | Status: AC

## 2024-03-22 MED ORDER — TRAZODONE HCL 100 MG PO TABS: 100.0000 mg | ORAL_TABLET | Freq: Every day | ORAL | 3 refills | Status: AC

## 2024-03-22 NOTE — Telephone Encounter (Signed)
 Copied from CRM [id number]. Topic: General - Other >> Mar 22, 2024  1:31 PM Santiya F wrote: Reason for CRM: Emannuel with [employer] is calling in because he says medicare is requiring patient's CGM usage (freestyle libre 3) to be documented for use in patient's progress notes. He is requesting that be mentioned in the notes for today's visit.

## 2024-03-22 NOTE — Assessment & Plan Note (Signed)
 Controlled Encouraged to continue current regimen as is BP Readings from Last 3 Encounters:  03/22/24 112/74  03/15/24 (!) 115/52  03/06/24 100/65

## 2024-03-22 NOTE — Progress Notes (Signed)
 Established Patient Office Visit  Subjective:  Patient ID: Amy Livingston, female    DOB: Dec 16, 1955  Age: 68 y.o. MRN: [id number]  CC:  Chief Complaint  Patient presents with   follow up    Wants to discuss her gabapentin  and see if there was something safer that didn't cause dementia.    med refills     Pended requested meds for refill    HPI Zhana Jeangilles is a 68 y.o. female with past medical history of  type 2 diabetes, hypertension, dyslipidemia presents for f/u of  chronic medical conditions. For the details of today's visit, please refer to the assessment and plan.     Past Medical History:  Diagnosis Date   Anxiety    Atrial fibrillation (HCC)    Diabetes mellitus without complication (HCC)    Dysrhythmia    GERD (gastroesophageal reflux disease)    Hypertension    PONV (postoperative nausea and vomiting)    Shortness of breath dyspnea     Past Surgical History:  Procedure Laterality Date   ABDOMINAL HYSTERECTOMY     ANTERIOR CERVICAL DECOMP/DISCECTOMY FUSION N/A 01/13/2016   Procedure: C5-6 ANTERIOR CERVICAL DECOMPRESSION/DISKECTOMY/FUSION;  Surgeon: Victory Gens, MD;  Location: [facility];  Service: Neurosurgery;  Laterality: N/A;  C5-6 ANTERIOR CERVICAL DECOMPRESSION/DISKECTOMY/FUSION   BACK SURGERY     x 5   BIOPSY  07/25/2023   Procedure: BIOPSY;  Surgeon: Cindie Carlin POUR, DO;  Location: [facility];  Service: Endoscopy;;   CHOLECYSTECTOMY     COLONOSCOPY WITH PROPOFOL  N/A 07/25/2023   Procedure: COLONOSCOPY WITH PROPOFOL ;  Surgeon: Cindie Carlin POUR, DO;  Location: [facility];  Service: Endoscopy;  Laterality: N/A;  10:45 am, asa 3   ESOPHAGEAL BRUSHING  07/25/2023   Procedure: ESOPHAGEAL BRUSHING;  Surgeon: Cindie Carlin POUR, DO;  Location: [facility];  Service: Endoscopy;;   ESOPHAGOGASTRODUODENOSCOPY (EGD) WITH PROPOFOL  N/A 07/25/2023   Procedure: ESOPHAGOGASTRODUODENOSCOPY (EGD) WITH PROPOFOL ;  Surgeon: Cindie Carlin POUR, DO;  Location: [facility];  Service: Endoscopy;  Laterality: N/A;   FOOT SURGERY     KNEE ARTHROSCOPY     left   KNEE SURGERY Right    POLYPECTOMY  07/25/2023   Procedure: POLYPECTOMY INTESTINAL;  Surgeon: Cindie Carlin POUR, DO;  Location: [facility];  Service: Endoscopy;;   TEE WITHOUT CARDIOVERSION N/A 10/13/2023   Procedure: ECHOCARDIOGRAM, TRANSESOPHAGEAL;  Surgeon: Stacia Diannah SQUIBB, MD;  Location: [facility];  Service: Cardiovascular;  Laterality: N/A;   TUBAL LIGATION      No family history on file.  Social History   Socioeconomic History   Marital status: Divorced    Spouse name: Not on file   Number of children: Not on file   Years of education: Not on file   Highest education level: Some college, no degree  Occupational History   Not on file  Tobacco Use   Smoking status: Never   Smokeless tobacco: Never  Substance and Sexual Activity   Alcohol use: No   Drug use: No   Sexual activity: Not on file  Other Topics Concern   Not on file  Social History Narrative   Not on file   Social Drivers of Health   Financial Resource Strain: Medium Risk (03/21/2024)   Overall Financial Resource Strain (CARDIA)    Difficulty of Paying Living Expenses: Somewhat hard  Food Insecurity: Food Insecurity Present (03/21/2024)   Hunger Vital Sign    Worried About Programme researcher, broadcasting/film/video in  the Last Year: Sometimes true    Ran Out of Food in the Last Year: Sometimes true  Transportation Needs: No Transportation Needs (03/21/2024)   PRAPARE - Administrator, Civil Service (Medical): No    Lack of Transportation (Non-Medical): No  Physical Activity: Inactive (03/21/2024)   Exercise Vital Sign    Days of Exercise per Week: 0 days    Minutes of Exercise per Session: Not on file  Stress: Stress Concern Present (03/21/2024)   [employer] of Occupational Health - Occupational Stress Questionnaire    Feeling of Stress: Very much  Social Connections: Socially Isolated (03/21/2024)   Social  Connection and Isolation Panel    Frequency of Communication with Friends and Family: Three times a week    Frequency of Social Gatherings with Friends and Family: Never    Attends Religious Services: Never    Database administrator or Organizations: No    Attends Engineer, structural: Not on file    Marital Status: Widowed  Intimate Partner Violence: Not At Risk (03/11/2024)   Humiliation, Afraid, Rape, and Kick questionnaire    Fear of Current or Ex-Partner: No    Emotionally Abused: No    Physically Abused: No    Sexually Abused: No    Outpatient Medications Prior to Visit  Medication Sig Dispense Refill   acetaminophen  (TYLENOL ) 325 MG tablet Take 2 tablets (650 mg total) by mouth every 6 (six) hours as needed for mild pain (pain score 1-3) or fever (or Fever >/= 101).     atorvastatin  (LIPITOR) 80 MG tablet TAKE 1 TABLET(80 MG) BY MOUTH DAILY 90 tablet 3   busPIRone  (BUSPAR ) 10 MG tablet Take 1 tablet (10 mg total) by mouth 2 (two) times daily. (Patient taking differently: Take 10 mg by mouth daily.) 90 tablet 1   Cholecalciferol (VITAMIN D -3) 25 MCG (1000 UT) CAPS Take 1,000 Units by mouth 2 (two) times daily at 10 am and 4 pm.     cyclobenzaprine  (FLEXERIL ) 10 MG tablet Take 10 mg by mouth every 12 (twelve) hours.     ELIQUIS  5 MG TABS tablet Take 5 mg by mouth 2 (two) times daily.     ertapenem  (INVANZ ) IVPB Inject 1 g into the vein daily for 10 days. Indication:  ESBL Klebsiella UTI First Dose: Yes Last Day of Therapy:  03/23/24 MIDLINE /PIC Care Per Protocol:   Labs weekly while on IV antibiotics: _x_ CBC with differential   _x_ CMP     _x_ Please pull PIC at completion of IV antibiotics     Fax weekly lab results  promptly to [phone]  Method of administration: Mini-Bag Plus / Gravity Method of administration may be changed at the discretion of home infusion pharmacist based upon assessment of the patient and/or caregiver's ability to self-administer the  medication ordered. 10 Units 0   famotidine  (PEPCID ) 20 MG tablet Take 1 tablet (20 mg total) by mouth daily. 30 tablet 0   gabapentin  (NEURONTIN ) 300 MG capsule TAKE 1 CAPSULE(300 MG) BY MOUTH THREE TIMES DAILY 120 capsule 1   glucose blood (ACCU-CHEK GUIDE TEST) test strip USE TO TEST BLOOD SUGAR EVERY MORNING, AT NOON, AND EVERY NIGHT AT BEDTIME. 100 strip 0   mirabegron  ER (MYRBETRIQ ) 25 MG TB24 tablet Take 1 tablet (25 mg total) by mouth at bedtime.     mometasone  (ELOCON ) 0.1 % cream Apply topically daily as needed for itch 15 g 3   omeprazole  (PRILOSEC) 40  MG capsule Take 1 capsule (40 mg total) by mouth daily. 90 capsule 0   ondansetron  (ZOFRAN ) 4 MG tablet Take 1 tablet (4 mg total) by mouth every 6 (six) hours as needed for nausea. 20 tablet 0   oxyCODONE -acetaminophen  (PERCOCET) 10-325 MG tablet Take 1 tablet by mouth every 8 (eight) hours as needed for pain.     sertraline  (ZOLOFT ) 50 MG tablet TAKE 1 TABLET(50 MG) BY MOUTH DAILY 90 tablet 0   sucralfate  (CARAFATE ) 1 g tablet Take 1 tablet (1 g total) by mouth 4 (four) times daily -  with meals and at bedtime. 45 tablet 0   tamsulosin  (FLOMAX ) 0.4 MG CAPS capsule Take 1 capsule (0.4 mg total) by mouth daily after supper. 30 capsule 0   vancomycin  (VANCOCIN ) 125 MG capsule Take 1 capsule (125 mg total) by mouth every 12 (twelve) hours for 11 days. 22 capsule 0   insulin  isophane & regular human KwikPen (NOVOLIN  70/30 KWIKPEN) (70-30) 100 UNIT/ML KwikPen Inject 30 Units into the skin 2 (two) times daily with a meal. 3 mL 1   meclizine  (ANTIVERT ) 25 MG tablet Take 1 tablet (25 mg total) by mouth daily as needed for dizziness. 30 tablet 5   tirzepatide  (MOUNJARO ) 12.5 MG/0.5ML Pen Inject 12.5 mg into the skin once a week. 2 mL 0   traZODone  (DESYREL ) 100 MG tablet TAKE 1 TABLET(100 MG) BY MOUTH AT BEDTIME 60 tablet 1   No facility-administered medications prior to visit.    Allergies  Allergen Reactions   Elemental Sulfur Itching and  Rash    Yeast infections   Metformin  And Related Diarrhea    GI distress    ROS Review of Systems  Constitutional:  Negative for chills and fever.  Eyes:  Negative for visual disturbance.  Respiratory:  Negative for chest tightness and shortness of breath.   Neurological:  Negative for dizziness and headaches.      Objective:    Physical Exam HENT:     Head: Normocephalic.     Mouth/Throat:     Mouth: Mucous membranes are moist.  Cardiovascular:     Rate and Rhythm: Normal rate.     Heart sounds: Normal heart sounds.  Pulmonary:     Effort: Pulmonary effort is normal.     Breath sounds: Normal breath sounds.  Neurological:     Mental Status: She is alert.     BP 112/74   Pulse 97   Resp 16   Ht 5' 6 (1.676 m)   Wt 194 lb (88 kg)   SpO2 95%   BMI 31.31 kg/m  Wt Readings from Last 3 Encounters:  03/22/24 194 lb (88 kg)  03/11/24 207 lb 10.8 oz (94.2 kg)  02/15/24 180 lb (81.6 kg)    Lab Results  Component Value Date   TSH 0.634 06/01/2023   Lab Results  Component Value Date   WBC 10.9 (H) 03/14/2024   HGB 7.8 (L) 03/14/2024   HCT 25.1 (L) 03/14/2024   MCV 85.7 03/14/2024   PLT 339 03/14/2024   Lab Results  Component Value Date   NA 137 03/14/2024   K 4.4 03/14/2024   CO2 26 03/14/2024   GLUCOSE 215 (H) 03/14/2024   BUN 12 03/14/2024   CREATININE 0.81 03/14/2024   BILITOT 0.6 03/11/2024   ALKPHOS 147 (H) 03/11/2024   AST 18 03/11/2024   ALT 11 03/11/2024   PROT 5.6 (L) 03/11/2024   ALBUMIN 2.2 (L) 03/11/2024   CALCIUM  8.4 (  L) 03/14/2024   ANIONGAP 11 03/14/2024   EGFR 98.0 10/18/2023   Lab Results  Component Value Date   CHOL 130 06/01/2023   Lab Results  Component Value Date   HDL 49 06/01/2023   Lab Results  Component Value Date   LDLCALC 57 06/01/2023   Lab Results  Component Value Date   TRIG 138 06/01/2023   Lab Results  Component Value Date   CHOLHDL 2.7 06/01/2023   Lab Results  Component Value Date   HGBA1C 8.9  (H) 03/11/2024      Assessment & Plan:  Uncontrolled type 2 diabetes mellitus with hyperglycemia, with long-term current use of insulin  (HCC) Assessment & Plan: The patient denies polyuria, polyphagia, and polydipsia. She reports her highest blood glucose has been 220 mg/dL and her lowest 77 mg/dL. She is requesting medication refills, which have been sent. A referral to endocrinology has been placed for further management of her insulin  therapy. A prescription for a Freestyle Libre continuous glucose monitor has been placed to support improved tracking of blood glucose trends. The patient is encouraged to continue monitoring her glucose regularly, maintain a balanced diet, and engage in regular physical activity as tolerated. Will adjust treatment as needed pending ongoing monitoring and specialist recommendations.    Orders: -     NovoLIN  70/30 FlexPen; Inject 30 Units into the skin 2 (two) times daily with a meal.  Dispense: 15 mL; Refill: 5 -     Tirzepatide ; Inject 15 mg into the skin once a week.  Dispense: 6 mL; Refill: 1 -     Ambulatory referral to Endocrinology  Essential hypertension Assessment & Plan: Controlled Encouraged to continue current regimen as is BP Readings from Last 3 Encounters:  03/22/24 112/74  03/15/24 (!) 115/52  03/06/24 100/65          Gastroesophageal reflux disease without esophagitis Assessment & Plan: The patient reports experiencing heartburn symptoms during the first few days after taking her Mounjaro  injection. She is encouraged to take Protonix  40 mg daily as needed on days when she experiences severe heartburn.   Orders: -     Pantoprazole  Sodium; Take 1 tablet (40 mg total) by mouth as needed.  Dispense: 30 tablet; Refill: 3  Dizziness -     Meclizine  HCl; Take 1 tablet (25 mg total) by mouth daily as needed for dizziness.  Dispense: 30 tablet; Refill: 5  Other insomnia -     traZODone  HCl; Take 1 tablet (100 mg total) by  mouth at bedtime.  Dispense: 60 tablet; Refill: 3   Note: This chart has been completed using Engineer, civil (consulting) software, and while attempts have been made to ensure accuracy, certain words and phrases may not be transcribed as intended.   Follow-up: Return in about 4 months (around 07/22/2024).   Calix Heinbaugh, FNP

## 2024-03-22 NOTE — Assessment & Plan Note (Signed)
 The patient denies polyuria, polyphagia, and polydipsia. She reports her highest blood glucose has been 220 mg/dL and her lowest 77 mg/dL. She is requesting medication refills, which have been sent. A referral to endocrinology has been placed for further management of her insulin  therapy. A prescription for a Freestyle Libre continuous glucose monitor has been placed to support improved tracking of blood glucose trends. The patient is encouraged to continue monitoring her glucose regularly, maintain a balanced diet, and engage in regular physical activity as tolerated. Will adjust treatment as needed pending ongoing monitoring and specialist recommendations.

## 2024-03-22 NOTE — Progress Notes (Signed)
 The purpose of this virtual visit is to provide medical care while limiting exposure to the novel coronavirus (COVID19) for both patient and office staff.   Consent was obtained for video visit:  Yes.   Answered questions that patient had about telehealth interaction:  Yes.   I discussed the limitations, risks, security and privacy concerns of performing an evaluation and management service by telephone. I also discussed with the patient that there may be a patient responsible charge related to this service. The patient expressed understanding and agreed to proceed.   Patient Location: Home Provider Location:office Follow-up for recent hospitalization at [facility] between 03/11/2024 until 03/15/2024. 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 difficile diarrhea, recurrent UTI, urinary incontinence had presented to [facility] on 03/30/2024 with weakness dysuria urinary frequency and worsening urinary incontinence.  Urine culture grew ESBL CLR and E. coli.  And she also had ESBL Klebsiella bacteremia.  On further investigation it was found that she had urinary retention with incomplete bladder emptying and had a postvoid scan of more than 500 cc and a Foley catheter was placed and it drained close to a liter of urine.  The patient was sent home on a Foley catheter.  She also went on intravenous antibiotics which was IV or ertapenem  for a total of 14 days and the end date being 03/23/2024. She is getting her IV antibiotic every day.  There is no problem with that She does not have any side effects from the medication No fever or chills No rash or pruritus  On examination patient is looking well No distress The left arm PICC/midline  site is okay  Imp pression/recommendation ESBL Klebsiella bacteremia and complicated UTI due to  incomplete bladder emptying with retention and overflow.  There was left-sided hydronephrosis on the ultrasound.  The cause of the bladder outlet obstruction is unclear.  It could be related to diabetes mellitus or she could have a local cause She has a appointment with urogynecologist at the end of this month She will continue ertapenem  for a total of 14 days and will finish on 03/23/2024. The PICC line will be removed after that.    History of C. difficile colitis.  We put her on prophylactic vancomycin  125 mg p.o. twice daily.  This she will take 3 days post antibiotic and the end date would be 03/26/2024.  AKI has resolved  Diabetes mellitus  Paroxysmal atrial fibrillation on Eliquis   C5-C6 fusion surgery History of staph lugdunensis bacteremia  Discussed the management with the patient in detail She discharged from my clinic. Total time spent on this video visit was 20 minutes.

## 2024-03-22 NOTE — Patient Instructions (Signed)
 I appreciate the opportunity to provide care to you today!    Follow up:  4 months  Labs: next visit  For a Healthier YOU, I Recommend: Reducing your intake of sugar, sodium, carbohydrates, and saturated fats. Increasing your fiber intake by incorporating more whole grains, fruits, and vegetables into your meals. Setting healthy goals with a focus on lowering your consumption of carbs, sugar, and unhealthy fats. Adding variety to your diet by including a wide range of fruits and vegetables. Cutting back on soda and limiting processed foods as much as possible. Staying active: In addition to taking your weight loss medication, aim for at least 150 minutes of moderate-intensity physical activity each week for optimal results.  Please follow up if your symptoms worsen or fail to improve.    Please continue to a heart-healthy diet and increase your physical activities. Try to exercise for at least five days a week.    It was a pleasure to see you and I look forward to continuing to work together on your health and well-being. Please do not hesitate to call the office if you need care or have questions about your care.  In case of emergency, please visit the Emergency Department for urgent care, or contact our clinic at [phone] to schedule an appointment. We're here to help you!   Have a wonderful day and week. With Gratitude, Lamika Connolly MSN, FNP-BC

## 2024-03-22 NOTE — Assessment & Plan Note (Signed)
 The patient reports experiencing heartburn symptoms during the first few days after taking her Mounjaro  injection. She is encouraged to take Protonix  40 mg daily as needed on days when she experiences severe heartburn.

## 2024-03-23 NOTE — Telephone Encounter (Signed)
 I don't see where we ever ordered a CGM through them for patient. We have an order from [facility] for regular meter

## 2024-03-28 ENCOUNTER — Ambulatory Visit: Admitting: Obstetrics

## 2024-03-29 DIAGNOSIS — B9620 Unspecified Escherichia coli [E. coli] as the cause of diseases classified elsewhere: Secondary | ICD-10-CM | POA: Diagnosis not present

## 2024-03-29 DIAGNOSIS — E876 Hypokalemia: Secondary | ICD-10-CM

## 2024-03-29 DIAGNOSIS — A4159 Other Gram-negative sepsis: Secondary | ICD-10-CM | POA: Diagnosis not present

## 2024-03-29 DIAGNOSIS — E871 Hypo-osmolality and hyponatremia: Secondary | ICD-10-CM

## 2024-03-29 DIAGNOSIS — I480 Paroxysmal atrial fibrillation: Secondary | ICD-10-CM | POA: Diagnosis not present

## 2024-03-29 DIAGNOSIS — I10 Essential (primary) hypertension: Secondary | ICD-10-CM

## 2024-03-29 DIAGNOSIS — F411 Generalized anxiety disorder: Secondary | ICD-10-CM

## 2024-03-29 DIAGNOSIS — E1365 Other specified diabetes mellitus with hyperglycemia: Secondary | ICD-10-CM

## 2024-03-29 DIAGNOSIS — F32A Depression, unspecified: Secondary | ICD-10-CM

## 2024-03-29 DIAGNOSIS — Z794 Long term (current) use of insulin: Secondary | ICD-10-CM

## 2024-03-29 DIAGNOSIS — N390 Urinary tract infection, site not specified: Secondary | ICD-10-CM | POA: Diagnosis not present

## 2024-03-29 NOTE — Telephone Encounter (Signed)
 Please let me know once the requested addemdum has been made to the 8/14 note and I will fax

## 2024-03-29 NOTE — Telephone Encounter (Signed)
 [place] w/ [facility] requesting for addendum to be made to notes from 03/22/24 to include patient's usage of the freestyle libre 3 or cgm for blood sugar testing. Per [place] the addendum needs to be completed by Gloria Zarwolo, FNP. Needing addended notes faxed to number below  Direct number: [phone] Fax: [phone]

## 2024-03-29 NOTE — Telephone Encounter (Signed)
 completed

## 2024-04-02 ENCOUNTER — Encounter: Payer: Self-pay | Admitting: Obstetrics

## 2024-04-02 ENCOUNTER — Ambulatory Visit: Payer: Self-pay | Admitting: Obstetrics

## 2024-04-02 ENCOUNTER — Other Ambulatory Visit (HOSPITAL_COMMUNITY)
Admission: RE | Admit: 2024-04-02 | Discharge: 2024-04-02 | Disposition: A | Discharge status: Home / Self Care | Source: Ambulatory Visit | Attending: Obstetrics | Admitting: Obstetrics

## 2024-04-02 ENCOUNTER — Ambulatory Visit (INDEPENDENT_AMBULATORY_CARE_PROVIDER_SITE_OTHER): Admitting: Obstetrics

## 2024-04-02 ENCOUNTER — Other Ambulatory Visit (HOSPITAL_COMMUNITY)
Admission: RE | Admit: 2024-04-02 | Discharge: 2024-04-02 | Disposition: A | Discharge status: Home / Self Care | Source: Other Acute Inpatient Hospital | Attending: Obstetrics | Admitting: Obstetrics

## 2024-04-02 VITALS — BP 81/41 | HR 89 | Temp 98.3°F | Ht 64.96 in | Wt 185.4 lb

## 2024-04-02 DIAGNOSIS — R82998 Other abnormal findings in urine: Secondary | ICD-10-CM

## 2024-04-02 DIAGNOSIS — Z794 Long term (current) use of insulin: Secondary | ICD-10-CM

## 2024-04-02 DIAGNOSIS — R159 Full incontinence of feces: Secondary | ICD-10-CM

## 2024-04-02 DIAGNOSIS — N9089 Other specified noninflammatory disorders of vulva and perineum: Secondary | ICD-10-CM

## 2024-04-02 DIAGNOSIS — R339 Retention of urine, unspecified: Secondary | ICD-10-CM | POA: Diagnosis not present

## 2024-04-02 DIAGNOSIS — R319 Hematuria, unspecified: Secondary | ICD-10-CM

## 2024-04-02 DIAGNOSIS — N39492 Postural (urinary) incontinence: Secondary | ICD-10-CM

## 2024-04-02 DIAGNOSIS — N390 Urinary tract infection, site not specified: Secondary | ICD-10-CM

## 2024-04-02 DIAGNOSIS — Z87440 Personal history of urinary (tract) infections: Secondary | ICD-10-CM

## 2024-04-02 DIAGNOSIS — E1140 Type 2 diabetes mellitus with diabetic neuropathy, unspecified: Secondary | ICD-10-CM

## 2024-04-02 DIAGNOSIS — Z113 Encounter for screening for infections with a predominantly sexual mode of transmission: Secondary | ICD-10-CM | POA: Diagnosis not present

## 2024-04-02 DIAGNOSIS — N952 Postmenopausal atrophic vaginitis: Secondary | ICD-10-CM | POA: Insufficient documentation

## 2024-04-02 DIAGNOSIS — N7689 Other specified inflammation of vagina and vulva: Secondary | ICD-10-CM

## 2024-04-02 LAB — URINALYSIS, ROUTINE W REFLEX MICROSCOPIC
Bilirubin Urine: NEGATIVE
Glucose, UA: >=500 mg/dL — AB
Ketones, ur: NEGATIVE mg/dL
Nitrite: POSITIVE — AB
Protein, ur: 100 mg/dL — AB
Specific Gravity, Urine: 1.011 (ref 1.005–1.030)
WBC, UA: >50 WBC/hpf (ref 0–5)
pH: 6 (ref 5.0–8.0)

## 2024-04-02 LAB — POCT URINALYSIS DIP (CLINITEK)
Bilirubin, UA: NEGATIVE
Glucose, UA: >=1000 mg/dL — AB
Ketones, POC UA: NEGATIVE mg/dL
Nitrite, UA: POSITIVE — AB
POC PROTEIN,UA: 100 — AB
Spec Grav, UA: 1.015 (ref 1.010–1.025)
Urobilinogen, UA: 0.2 U/dL
pH, UA: 6 (ref 5.0–8.0)

## 2024-04-02 MED ORDER — VITAMIN C 100 MG PO TABS: 100.0000 mg | ORAL_TABLET | Freq: Every day | ORAL | 2 refills | Status: AC

## 2024-04-02 MED ORDER — ESTRADIOL 0.1 MG/GM VA CREA: 0.5000 g | TOPICAL_CREAM | VAGINAL | 3 refills | Status: AC

## 2024-04-02 MED ORDER — METHENAMINE HIPPURATE 1 G PO TABS: 1.0000 g | ORAL_TABLET | Freq: Two times a day (BID) | ORAL | 2 refills | Status: DC

## 2024-04-02 NOTE — Patient Instructions (Addendum)
 For treatment of recurrent urinary tract infections, we discussed management of recurrent UTIs including prophylaxis with a daily low dose antibiotic, transvaginal estrogen therapy, D-mannose, and cranberry supplements.  We discussed the role of diagnostic testing such as cystoscopy and upper tract imaging.     For vaginal atrophy (thinning of the vaginal tissue that can cause dryness and burning) and UTI prevention we discussed estrogen replacement in the form of vaginal cream.   Start vaginal estrogen therapy nightly for two weeks then 2 times weekly at night. This can be placed with your finger or an applicator inside the vagina and around the urethra.  Please let us  know if the prescription is too expensive and we can look for alternative options.   Is vaginal estrogen therapy safe for me? Vaginal estrogen preparations act on the vaginal skin, and only a very tiny amount is absorbed into the bloodstream (0.01%).  They work in a similar way to hand or face cream.  There is minimal absorption and they are therefore perfectly safe. If you have had breast cancer and have persistent troublesome symptoms which aren't settling with vaginal moisturisers and lubricants, local estrogen treatment may be a possibility, but consultation with your oncologist should take place first.   Stop Flomax  due to lack of relief and low blood pressure  We will reassess your bladder emptying at follow-up and send your urine for testing.   Constipation: Our goal is to achieve formed bowel movements daily or every-other-day.  You may need to try different combinations of the following options to find what works best for you - everybody's body works differently so feel free to adjust the dosages as needed.  Some options to help maintain bowel health include:  Dietary changes (more leafy greens, vegetables and fruits; less processed foods) Fiber supplementation (Benefiber, FiberCon, Metamucil or Psyllium). Start slow and  increase gradually to full dose. Over-the-counter agents such as: stool softeners (Docusate or Colace) and/or laxatives (Miralax , milk of magnesia)  Power Pudding is a natural mixture that may help your constipation.  To make blend 1 cup applesauce, 1 cup wheat bran, and 3/4 cup prune juice, refrigerate and then take 1 tablespoon daily with a large glass of water as needed.   Women should try to eat at least 21 to 25 grams of fiber a day, while men should aim for 30 to 38 grams a day. You can add fiber to your diet with food or a fiber supplement such as psyllium (metamucil), benefiber, or fibercon.   Here's a look at how much dietary fiber is found in some common foods. When buying packaged foods, check the Nutrition Facts label for fiber content. It can vary among brands.  Fruits Serving size Total fiber (grams)*  Raspberries 1 cup 8.0  Pear 1 medium 5.5  Apple, with skin 1 medium 4.5  Banana 1 medium 3.0  Orange 1 medium 3.0  Strawberries 1 cup 3.0   Vegetables Serving size Total fiber (grams)*  Green peas, boiled 1 cup 9.0  Broccoli, boiled 1 cup chopped 5.0  Turnip greens, boiled 1 cup 5.0  Brussels sprouts, boiled 1 cup 4.0  Potato, with skin, baked 1 medium 4.0  Sweet corn, boiled 1 cup 3.5  Cauliflower, raw 1 cup chopped 2.0  Carrot, raw 1 medium 1.5   Grains Serving size Total fiber (grams)*  Spaghetti, whole-wheat, cooked 1 cup 6.0  Barley, pearled, cooked 1 cup 6.0  Bran flakes 3/4 cup 5.5  Quinoa, cooked 1 cup 5.0  Oat bran muffin 1 medium 5.0  Oatmeal, instant, cooked 1 cup 5.0  Popcorn, air-popped 3 cups 3.5  Brown rice, cooked 1 cup 3.5  Bread, whole-wheat 1 slice 2.0  Bread, rye 1 slice 2.0   Legumes, nuts and seeds Serving size Total fiber (grams)*  Split peas, boiled 1 cup 16.0  Lentils, boiled 1 cup 15.5  Black beans, boiled 1 cup 15.0  Baked beans, canned 1 cup 10.0  Chia seeds 1 ounce 10.0  Almonds 1 ounce (23 nuts) 3.5  Pistachios 1 ounce (49  nuts) 3.0  Sunflower kernels 1 ounce 3.0  *Rounded to nearest 0.5 gram. Source: [employer] for [employer], Legacy Release    Accidental Bowel Leakage:  - Treatment options include anti-diarrhea medication (loperamide/ Imodium OTC or prescription lomotil), fiber supplements, physical therapy, and possible sacral neuromodulation or surgery.

## 2024-04-02 NOTE — Assessment & Plan Note (Addendum)
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  - no prolapse on exam, consider pessary to reassess bladder emptying - discussed low dose vaginal estrogen, methenamine  for UTI, optimize glycemic control, and titrate miralax  to optimize stool consistency - discussed management options for persistent urinary retention such as CIC, indwelling foley placement, suprapubic catheter placement, sacral neuromodulation after urodynamic testing, or conduit by reconstructive urology. Patient declines CIC today or teaching of family member for assistance. Encouraged pt to consider options. - discontinue flomax  due to hypotension and discouraged mirabegron  use until after resolution of incomplete bladder emptying

## 2024-04-02 NOTE — Assessment & Plan Note (Addendum)
-   increased risk of contamination with urine sample, prefer catheterized sample - h/o C. Diff, s/p vanc, colonoscopy 07/25/23 with hemorrhoids, diverticula and tubular adeonoma - history of constipation with BM 1x/week and on mounjaro , previously on Linzess - Treatment options include anti-diarrhea medication (loperamide/ Imodium OTC or prescription lomotil), fiber supplements, physical therapy, and possible sacral neuromodulation or surgery.   - optimized stool consistency and treat constipation with titration of fiber supplementation or miralax  - For constipation, we reviewed the importance of a better bowel regimen.  We also discussed the importance of avoiding chronic straining, as it can exacerbate her pelvic floor symptoms; we discussed treating constipation and straining prior to surgery, as postoperative straining can lead to damage to the repair and recurrence of symptoms. We discussed initiating therapy with increasing fluid intake, fiber supplementation, stool softeners, and laxatives such as miralax .  - reviewed possible sacral neuromodulation if HbA1C < 8

## 2024-04-02 NOTE — Assessment & Plan Note (Addendum)
-   POCT + leuk/heme, pending culture - incomplete bladder emptying with catheterization at  - mostly postural leakage with position changes - discussed possible underlying etiology for leakage such as stress urinary incontinence, OAB, urinary retention - discontinue mirabegron  due to incomplete emptying - will need urodynamic testing after resolution of rUTI if refractory symptoms after fluid management, caffeine reduction and optimization of bowel consistency

## 2024-04-02 NOTE — Assessment & Plan Note (Signed)
-   discussed importance of glycemic control due to increased risk of infection and urinary incontinence - ran out of insulin  for 2 weeks prior to recent hospitalization with glucose in 200s - encouraged to continue insulin  use and Mounjaro  - discussed HbA1C < 8 needed prior to surgical intervention

## 2024-04-02 NOTE — Assessment & Plan Note (Addendum)
-   POCT catheterized UA + leuk/heme/glucose/protein, pending culture and discussed follow-up with ID if resistant uropathogen without sensitivity to PO antibiotics - denies UTI symptoms, back pain - recent hospitalization for sepsis, with encephalopathy with weakness, dysuria, Tmax 103, and water stool after running out of insulin  for 2 weeks. Blood culture positive for Klebsiella pneumoniae sensitive to imipenem and zosyn . S/p PICC line with IV meropenem .  - For treatment of recurrent urinary tract infections, we discussed management of recurrent UTIs including prophylaxis with a daily low dose antibiotic, transvaginal estrogen therapy, D-mannose, and cranberry supplements.  We discussed the role of diagnostic testing such as cystoscopy and upper tract imaging.   - Rx to start low dose vaginal estrogen - discussed risk of C. Diff, however through joint decision making will start methenamine  to reduce risk of rUTI due to recent sepsis and hospitalization  - Cr 0.81 in 03/14/24 - will postpone cystoscopy until after resolution of rUTI - renal US  03/12/24 with moderate left hydronephrosis. Mild fullness of the right renal collecting system with diffused bladder thickening and PVR with foley placed. PVR by catheterization - reassess at follow-up to determine bladder emptying  - discussed importance of glycemic control - encouraged pt to seek urgent evaluation in ED if she experiences worsening symptoms such as fever > 100.4, nausea/vomiting, one sided back pain or blood in urine

## 2024-04-02 NOTE — Progress Notes (Signed)
 New Patient Evaluation and Consultation  Referring Provider: Bevely Doffing, FNP PCP: Zarwolo, Gloria, FNP Date of Service: 04/02/2024  SUBJECTIVE Chief Complaint: New Patient (Initial Visit) Amy Livingston is a 68 y.o. female hre today for recurrent UTI.)  History of Present Illness: Amy Livingston is a 68 y.o. White or Caucasian female seen in consultation at the request of NP Huenink for evaluation of recurrent UTI and urinary incontinence.    Present for appointment with daughter-in-law Reports recurrent UTI for around 1 year, prior to urinary leakage with postural changes from sitting to standing or night time when she gets up to void (started in 2023 and evaluated by Dr. May in 2023, recommended pelvic floor PT) Denies urgency leakage HbA1C 8.9 in 03/11/24, peaked around 10 months ago at > 15.5.  Reports fall around 1 year ago.  UTI symptoms: minimal symptoms with black dots in her visions or pressure in vagina and dysuria Reports straining or position changes  Denies blood in urine, dysuria, fever/chills, nausea/vomiting Reports intermittent right sided back pain, denies today Blood sugar fasting 120-180s.   Recent hospitalization 03/11/24 for sepsis with encephalopathy with weakness, dysuria, Tmax 103, and water stool after running out of insulin  for 2 weeks with glucose at 204. Started on IV Zosyn  switched to Meropenem , lactic acid trended down from 2.7 to 1.4. AKI with Cr 0.81 in 03/14/24. Blood culture positive for Klebsiella pneumoniae. Foley catheter placed with around 1L UOP, discharged home with foley catheter and PICC line for IV antibiotics outpatient. Urgent care evaluation 03/06/24 with urinary symptoms, left CVA tenderness and tachycardia attributed to 50K E. Coli resistant to cipro, and >100K MDRO Klebsiella UTI sensitive to Imipenem and zosyn . Rx Ceftin . Reports symptom improvement for 2 weeks.  11/05/23 hospitalization  Staph lugdunensis bacteremia of unclear etiology  in March 2025, sepsis from UTI and history of C. Diff evaluated by Dr. Dennise (ID), last seen  On D-mannose Not taking Mirabegron  25mg  for OAB Using flomax  with no relief of bladder emptying History of T2DM on insulin  and Mounjaro , HTN  Review of records significant for: Eliquis  for paroxysmal A. Fib, autonomic dysfunction managed by cardiology  Renal US  03/12/24:  CLINICAL DATA:  Bacteremia.   EXAM: RENAL / URINARY TRACT ULTRASOUND COMPLETE   COMPARISON:  CT 10/08/2023   FINDINGS: Right Kidney:   Renal measurements: 13.8 x 5.5 x 6.4 cm = volume: 253 mL. Fullness of the renal collecting system. Proximal ureter is mildly dilated. Similar findings were seen on prior CT. Normal parenchymal echogenicity. No evidence of renal fluid collection, stone or focal lesion.   Left Kidney:   Renal measurements: 14.9 x 7.6 x 7.9 cm = volume: 465 mL. Moderate hydronephrosis. Punctate stone on prior CT is not seen by ultrasound. No evidence of focal lesion. No renal fluid collection.   Bladder:   Diffuse bladder wall thickening. Layering debris. Postvoid residual 167 cc.   Other:   None.   IMPRESSION: 1. Moderate left hydronephrosis. Mild fullness of the right renal collecting system. Similar findings were seen on prior CT. 2. Diffuse bladder wall thickening. Layering debris in the bladder. Findings can be seen with cystitis. 3. Postvoid residual of 567 cc     Electronically Signed   By: Andrea Gasman M.D.   On: 03/12/2024 18:17  Urinary Symptoms: Leaks urine with lifting, going from sitting to standing, with a full bladder, and with movement to the bathroom Most often with postural changes, denies urgency leakage Leaks 8 time(s) per days.  Pad  use: 6 adult diapers per day, 1 depend/night Patient is bothered by UI symptoms.  Day time voids 6.  Nocturia: 1 times per night to void. Voiding dysfunction:  does not empty bladder well.  Patient does not use a catheter to  empty bladder.  When urinating, patient feels a weak stream, difficulty starting urine stream, dribbling after finishing, and the need to urinate multiple times in a row Drinks: 64-96oz water per day, 36oz of diet Dr. Nunzio   UTIs: 6 UTI's in the last year.   Denies history of blood in urine, kidney or bladder stones, bladder cancer, and kidney cancer Susceptibility data from last 90 days. Collected Specimen Info Organism AMPICILLIN AMPICILLIN/SULBACTAM CEFAZOLIN  CEFEPIME  Ceftazidime CEFTRIAXONE  Ciprofloxacin Gentamicin Susc lslt Imipenem Nitrofurantoin  Susc lslt Piperacillin  + Tazobactam  03/11/24 Blood from Right Antecubital Klebsiella pneumoniae  R  R   R  R  R  R  R  S   S  03/06/24 Urine, Clean Catch Escherichia coli  S  S  S  S   S  R  S  S  S  S    Klebsiella pneumoniae  R  R  R  R   R  R  R  S  R  S  02/01/24 Urine, Clean Catch Escherichia coli  S  S  S  S   S  R  S  S  S  S   Collected Specimen Info Organism Trimethoprim/Sulfa  03/11/24 Blood from Right Antecubital Klebsiella pneumoniae  R  03/06/24 Urine, Clean Catch Escherichia coli  S    Klebsiella pneumoniae  R  02/01/24 Urine, Clean Catch Escherichia coli  S    Pelvic Organ Prolapse Symptoms:                  Patient denies a feeling of a bulge the vaginal area. Vaginal ache present for 6 months.  Patient Denies seeing a bulge, bleeding or discharge This bulge is not bothersome.  Bowel Symptom: Bowel movements: 1 time(s) per week with constipation previously on linzess Reports diarrhea with weight loss in 2024 Stool consistency: hard Straining: yes.  Splinting: No Incomplete evacuation: yes.  Patient Admits to accidental bowel leakage / fecal incontinence  Occurs: 2 time(s) per month  Consistency with leakage: soft  Bowel regimen: fiber and metamucil PRN Last colonoscopy: Results hemorrhoids, diverticula, polypectomy consistent with tubular adenoma HM Colonoscopy          Upcoming     Colonoscopy (Every 5  Years) Next due on 07/24/2028    07/25/2023  COLONOSCOPY   Only the first 1 history entries have been loaded, but more history exists.                Sexual Function Sexually active: no.  Sexual orientation: Straight Pain with sex: No  Pelvic Pain Denies pelvic pain   Past Medical History:  Past Medical History:  Diagnosis Date   Anxiety    Atrial fibrillation (HCC)    Diabetes mellitus without complication (HCC)    Dysrhythmia    GERD (gastroesophageal reflux disease)    Hypertension    PONV (postoperative nausea and vomiting)    Shortness of breath dyspnea      Past Surgical History:   Past Surgical History:  Procedure Laterality Date   ABDOMINAL HYSTERECTOMY     ANTERIOR CERVICAL DECOMP/DISCECTOMY FUSION N/A 01/13/2016   Procedure: C5-6 ANTERIOR CERVICAL DECOMPRESSION/DISKECTOMY/FUSION;  Surgeon: Victory Gens, MD;  Location: [facility];  Service: Neurosurgery;  Laterality: N/A;  C5-6 ANTERIOR CERVICAL DECOMPRESSION/DISKECTOMY/FUSION   BACK SURGERY     x 5   BIOPSY  07/25/2023   Procedure: BIOPSY;  Surgeon: Cindie Carlin POUR, DO;  Location: [facility];  Service: Endoscopy;;   CHOLECYSTECTOMY     COLONOSCOPY WITH PROPOFOL  N/A 07/25/2023   Procedure: COLONOSCOPY WITH PROPOFOL ;  Surgeon: Cindie Carlin POUR, DO;  Location: [facility];  Service: Endoscopy;  Laterality: N/A;  10:45 am, asa 3   ESOPHAGEAL BRUSHING  07/25/2023   Procedure: ESOPHAGEAL BRUSHING;  Surgeon: Cindie Carlin POUR, DO;  Location: [facility];  Service: Endoscopy;;   ESOPHAGOGASTRODUODENOSCOPY (EGD) WITH PROPOFOL  N/A 07/25/2023   Procedure: ESOPHAGOGASTRODUODENOSCOPY (EGD) WITH PROPOFOL ;  Surgeon: Cindie Carlin POUR, DO;  Location: [facility];  Service: Endoscopy;  Laterality: N/A;   FOOT SURGERY     KNEE ARTHROSCOPY     left   KNEE SURGERY Right    POLYPECTOMY  07/25/2023   Procedure: POLYPECTOMY INTESTINAL;  Surgeon: Cindie Carlin POUR, DO;  Location: [facility];  Service:  Endoscopy;;   TEE WITHOUT CARDIOVERSION N/A 10/13/2023   Procedure: ECHOCARDIOGRAM, TRANSESOPHAGEAL;  Surgeon: Stacia Diannah SQUIBB, MD;  Location: [facility];  Service: Cardiovascular;  Laterality: N/A;   TUBAL LIGATION       Past OB/GYN History: OB History  Gravida Para Term Preterm AB Living  3 3 3   3   SAB IAB Ectopic Multiple Live Births      3    # Outcome Date GA Lbr Len/2nd Weight Sex Type Anes PTL Lv  3 Term     F Vag-Spont   LIV  2 Term     M Vag-Spont   LIV  1 Term     M Vag-Spont   LIV    Vaginal deliveries: largest infant 7lb15oz,  Forceps/ Vacuum deliveries: 0, Cesarean section: 0 Menopausal: Yes, at age 73, Denies vaginal bleeding since menopause Contraception: s/p BTL, menopause and hysterectomy. Last pap smear was 02/25/22 by Dr. May.  Any history of abnormal pap smears: no. No results found for: DIAGPAP, HPVHIGH, ADEQPAP  Medications: Patient has a current medication list which includes the following prescription(s): acetaminophen , vitamin c , atorvastatin , buspirone , vitamin d -3, cyclobenzaprine , eliquis , [START ON 04/05/2024] estradiol , famotidine , gabapentin , accu-chek guide test, novolin  70/30 kwikpen, meclizine , methenamine , mirabegron  er, mometasone , ondansetron , oxycodone -acetaminophen , sertraline , sucralfate , tamsulosin , tirzepatide , trazodone , and pantoprazole .   Allergies: Patient is allergic to elemental sulfur and metformin  and related.   Social History:  Social History   Tobacco Use   Smoking status: Former    Types: Cigarettes    Start date: 2008   Smokeless tobacco: Never  Vaping Use   Vaping status: Never Used  Substance Use Topics   Alcohol use: No   Drug use: No    Relationship status: divorced Patient lives with her daughter.   Patient is not employed. Regular exercise: No History of abuse: No  Family History:   Family History  Problem Relation Age of Onset   Renal cancer Neg Hx    Bladder Cancer Neg Hx    Uterine cancer  Neg Hx      Review of Systems: Review of Systems  Constitutional:  Positive for malaise/fatigue and weight loss. Negative for fever.  Respiratory:  Negative for cough, shortness of breath and wheezing.   Cardiovascular:  Positive for palpitations. Negative for chest pain and leg swelling.  Gastrointestinal:  Negative for abdominal pain and blood in stool.       Leakage  Genitourinary:  Positive for dysuria. Negative for frequency, hematuria and urgency.       Leakage, bulge  Skin:  Negative for rash.  Neurological:  Positive for dizziness and weakness. Negative for headaches.  Endo/Heme/Allergies:  Bruises/bleeds easily.  Psychiatric/Behavioral:  Negative for depression. The patient is nervous/anxious.      OBJECTIVE Physical Exam: Vitals:   04/02/24 1513  BP: (!) 81/41  Pulse: 89  Temp: 98.3 F (36.8 C)  Weight: 185 lb 6.4 oz (84.1 kg)  Height: 5' 4.96 (1.65 m)    Physical Exam Constitutional:      General: She is not in acute distress.    Appearance: Normal appearance.  Genitourinary:     Bladder and urethral meatus normal.     No lesions in the vagina.     Genitourinary Comments: Bilateral erythema with superficial excoriation, no bleeding     Right Labia: No rash, tenderness, lesions, skin changes or Bartholin's cyst.    Left Labia: No tenderness, lesions, skin changes, Bartholin's cyst or rash.       No vaginal discharge, erythema, tenderness, bleeding, ulceration or granulation tissue.     No vaginal prolapse present.    Severe vaginal atrophy present.     Right Adnexa: not tender, not full and no mass present.    Left Adnexa: not tender, not full and no mass present.    Cervix is absent.     Uterus is absent.     Urethral meatus caruncle not present.    No urethral prolapse, tenderness, mass, hypermobility, discharge or stress urinary incontinence with cough stress test present.     Bladder is not tender, urgency on palpation not present and masses not  present.      Pelvic Floor: Levator muscle strength is 3/5.    Levator ani not tender, obturator internus not tender, no asymmetrical contractions present and no pelvic spasms present.    Anal wink absent and BC reflex absent.     Symmetrical pelvic sensation. Cardiovascular:     Rate and Rhythm: Normal rate.  Pulmonary:     Effort: Pulmonary effort is normal. No respiratory distress.  Abdominal:     General: There is no distension.     Palpations: Abdomen is soft. There is no mass.     Tenderness: There is no abdominal tenderness. There is no right CVA tenderness or left CVA tenderness.     Hernia: No hernia is present.   Musculoskeletal:       Back:  Neurological:     Mental Status: She is alert.  Vitals reviewed. Exam conducted with a chaperone present.     POP-Q:   POP-Q  -3                                            Aa   -3                                           Ba  -9                                              C   1  Gh  2                                            Pb  9                                            tvl   -2                                            Ap  -2                                            Bp                                                 D    Post-Void Residual (PVR) by Bladder Scan: In order to evaluate bladder emptying, we discussed obtaining a postvoid residual and patient agreed to this procedure.  Procedure: The ultrasound unit was placed on the patient's abdomen in the suprapubic region after the patient had voided.    Post Void Residual - 04/02/24 1513       Post Void Residual   Post Void Residual 384 mL         Straight Catheterization Procedure for PVR: After verbal consent was obtained from the patient for catheterization to assess bladder emptying and residual volume the urethra and surrounding tissues were prepped with betadine and an in and out catheterization  was performed.  PVR was .  Urine appeared cloudy with white sedimentation. The patient tolerated the procedure well.   Laboratory Results: Lab Results  Component Value Date   COLORU yellow 04/02/2024   CLARITYU cloudy (A) 04/02/2024   GLUCOSEUR >=1,000 (A) 04/02/2024   BILIRUBINUR negative 04/02/2024   KETONESU Negative 11/18/2023   SPECGRAV 1.015 04/02/2024   RBCUR moderate (A) 04/02/2024   PHUR 6.0 04/02/2024   PROTEINUR 100 (A) 04/02/2024   UROBILINOGEN 0.2 04/02/2024   LEUKOCYTESUR Large (3+) (A) 04/02/2024    Lab Results  Component Value Date   CREATININE 0.81 03/14/2024   CREATININE 0.85 03/13/2024   CREATININE 1.03 (H) 03/12/2024    Lab Results  Component Value Date   HGBA1C 8.9 (H) 03/11/2024    Lab Results  Component Value Date   HGB 7.8 (L) 03/14/2024     ASSESSMENT AND PLAN Ms. Sauseda is a 68 y.o. with:  1. Postural urinary incontinence   2. Recurrent UTI   3. Type 2 diabetes mellitus with diabetic neuropathy, with long-term current use of insulin  (HCC)   4. Incontinence of feces, unspecified fecal incontinence type   5. Vulvar irritation   6. Incomplete bladder emptying   7. Vaginal atrophy     Postural urinary incontinence Assessment & Plan: - POCT + leuk/heme, pending culture - incomplete bladder emptying with catheterization at  - mostly postural leakage with position changes - discussed possible  underlying etiology for leakage such as stress urinary incontinence, OAB, urinary retention - discontinue mirabegron  due to incomplete emptying - will need urodynamic testing after resolution of rUTI if refractory symptoms after fluid management, caffeine reduction and optimization of bowel consistency   Recurrent UTI Assessment & Plan: - POCT catheterized UA + leuk/heme/glucose/protein, pending culture and discussed follow-up with ID if resistant uropathogen without sensitivity to PO antibiotics - denies UTI symptoms, back pain - recent  hospitalization for sepsis, with encephalopathy with weakness, dysuria, Tmax 103, and water stool after running out of insulin  for 2 weeks. Blood culture positive for Klebsiella pneumoniae sensitive to imipenem and zosyn . S/p PICC line with IV meropenem .  - For treatment of recurrent urinary tract infections, we discussed management of recurrent UTIs including prophylaxis with a daily low dose antibiotic, transvaginal estrogen therapy, D-mannose, and cranberry supplements.  We discussed the role of diagnostic testing such as cystoscopy and upper tract imaging.   - Rx to start low dose vaginal estrogen - discussed risk of C. Diff, however through joint decision making will start methenamine  to reduce risk of rUTI due to recent sepsis and hospitalization  - Cr 0.81 in 03/14/24 - will postpone cystoscopy until after resolution of rUTI - renal US  03/12/24 with moderate left hydronephrosis. Mild fullness of the right renal collecting system with diffused bladder thickening and PVR with foley placed. PVR by catheterization - reassess at follow-up to determine bladder emptying  - discussed importance of glycemic control - encouraged pt to seek urgent evaluation in ED if she experiences worsening symptoms such as fever > 100.4, nausea/vomiting, one sided back pain or blood in urine  Orders: -     POCT URINALYSIS DIP (CLINITEK) -     Urine Culture; Future -     Urine Microscopic; Future -     Estradiol ; Place 0.5 g vaginally 2 (two) times a week. Place 0.5g nightly for two weeks then twice a week after  Dispense: 30 g; Refill: 3 -     Methenamine  Hippurate; Take 1 tablet (1 g total) by mouth 2 (two) times daily with a meal.  Dispense: 30 tablet; Refill: 2 -     Vitamin C ; Take 1 tablet (100 mg total) by mouth daily.  Dispense: 30 tablet; Refill: 2  Type 2 diabetes mellitus with diabetic neuropathy, with long-term current use of insulin  (HCC) Assessment & Plan: - discussed importance of glycemic  control due to increased risk of infection and urinary incontinence - ran out of insulin  for 2 weeks prior to recent hospitalization with glucose in 200s - encouraged to continue insulin  use and Mounjaro  - discussed HbA1C < 8 needed prior to surgical intervention   Incontinence of feces, unspecified fecal incontinence type Assessment & Plan: - increased risk of contamination with urine sample, prefer catheterized sample - h/o C. Diff, s/p vanc, colonoscopy 07/25/23 with hemorrhoids, diverticula and tubular adeonoma - history of constipation with BM 1x/week and on mounjaro , previously on Linzess - Treatment options include anti-diarrhea medication (loperamide/ Imodium OTC or prescription lomotil), fiber supplements, physical therapy, and possible sacral neuromodulation or surgery.   - optimized stool consistency and treat constipation with titration of fiber supplementation or miralax  - For constipation, we reviewed the importance of a better bowel regimen.  We also discussed the importance of avoiding chronic straining, as it can exacerbate her pelvic floor symptoms; we discussed treating constipation and straining prior to surgery, as postoperative straining can lead to damage to the repair and recurrence  of symptoms. We discussed initiating therapy with increasing fluid intake, fiber supplementation, stool softeners, and laxatives such as miralax .  - reviewed possible sacral neuromodulation if HbA1C < 8   Vulvar irritation Assessment & Plan: - h/o staph lugdunensis bacteremia 10/2023 - nuswab to r/o infectious etiology - possibly due to urinary incontinence and discussed increased risk of superimposed bacterial injection - discussed proper vulvar care, warm compression, avoid pad use, cotton only underwear and barrier ointment if needed  - encouraged breaks from pad use, consider Purewick  - consider biopsy if persistent or worsening symptoms  Orders: -     Cervicovaginal ancillary  only  Incomplete bladder emptying Assessment & Plan: - PVR by catheterization  - hospitalization with foley placement due to PVR and moderate left hydronephrosis and fullness of right renal collecting system with bladder wall thickening due to UTI - discussed possible underlying etiology such as UTI, constipation, outlet obstruction from pelvic organ prolapse, detrusor hypocontractility from DM with peripheral neuropathy.  - no prolapse on exam, consider pessary to reassess bladder emptying - discussed low dose vaginal estrogen, methenamine  for UTI, optimize glycemic control, and titrate miralax  to optimize stool consistency - discussed management options for persistent urinary retention such as CIC, indwelling foley placement, suprapubic catheter placement, sacral neuromodulation after urodynamic testing, or conduit by reconstructive urology. Patient declines CIC today or teaching of family member for assistance. Encouraged pt to consider options. - discontinue flomax  due to hypotension and discouraged mirabegron  use until after resolution of incomplete bladder emptying   Vaginal atrophy Assessment & Plan: - For symptomatic vaginal atrophy options include lubrication with a water-based lubricant, personal hygiene measures and barrier protection against wetness, and estrogen replacement in the form of vaginal cream, vaginal tablets, or a time-released vaginal ring.   - Rx to start low dose vaginal estrogen   Time spent: I spent 83 minutes dedicated to the care of this patient on the date of this encounter to include pre-visit review of records, face-to-face time with the patient discussing recurrent UTI, postural incontinence, incomplete bladder emptying, vulvar irritation, vaginal atrophy, fecal incontinence, and post visit documentation and ordering medication/ testing.    Amy ONEIDA Gillis, MD

## 2024-04-02 NOTE — Assessment & Plan Note (Signed)
-   For symptomatic vaginal atrophy options include lubrication with a water-based lubricant, personal hygiene measures and barrier protection against wetness, and estrogen replacement in the form of vaginal cream, vaginal tablets, or a time-released vaginal ring.   - Rx to start low dose vaginal estrogen

## 2024-04-02 NOTE — Assessment & Plan Note (Addendum)
-   h/o staph lugdunensis bacteremia 10/2023 - nuswab to r/o infectious etiology - possibly due to urinary incontinence and discussed increased risk of superimposed bacterial injection - discussed proper vulvar care, warm compression, avoid pad use, cotton only underwear and barrier ointment if needed  - encouraged breaks from pad use, consider Purewick  - consider biopsy if persistent or worsening symptoms

## 2024-04-03 LAB — CERVICOVAGINAL ANCILLARY ONLY
Bacterial Vaginitis (gardnerella): NEGATIVE
Candida Glabrata: POSITIVE — AB
Candida Vaginitis: POSITIVE — AB
Comment: NEGATIVE
Comment: NEGATIVE
Comment: NEGATIVE

## 2024-04-03 MED ORDER — FLUCONAZOLE 150 MG PO TABS: 150.0000 mg | ORAL_TABLET | Freq: Once | ORAL | 0 refills | Status: AC

## 2024-04-03 MED ORDER — BORIC ACID 600 MG VA SUPP: 1.0000 | Freq: Every evening | VAGINAL | 0 refills | Status: DC

## 2024-04-05 LAB — URINE CULTURE: Culture: >=100000 — AB

## 2024-04-06 NOTE — Telephone Encounter (Signed)
Faxed back  to [facility]

## 2024-04-11 ENCOUNTER — Other Ambulatory Visit: Payer: Self-pay | Admitting: Family Medicine

## 2024-04-12 ENCOUNTER — Ambulatory Visit: Payer: Self-pay

## 2024-04-12 ENCOUNTER — Other Ambulatory Visit: Payer: Self-pay | Admitting: Family Medicine

## 2024-04-12 DIAGNOSIS — E1140 Type 2 diabetes mellitus with diabetic neuropathy, unspecified: Secondary | ICD-10-CM

## 2024-04-12 MED ORDER — GABAPENTIN 300 MG PO CAPS: 300.0000 mg | ORAL_CAPSULE | Freq: Three times a day (TID) | ORAL | 1 refills | Status: DC

## 2024-04-12 NOTE — Telephone Encounter (Signed)
 Gabapentin refilled

## 2024-04-12 NOTE — Telephone Encounter (Signed)
 Copied from CRM [id number]. Topic: Clinical - Red Word Triage >> Apr 12, 2024  8:30 AM Mia F wrote: Red Word that prompted transfer to Nurse Triage: Pt has neuropathy and says her whole body is numb due to her not having her medication. She ran out of the medication a week ago but says she has been requesting the medication through her pharmacy for over a week. A new refill request has been put in as I was unable to see the one through the pharmacy. Reason for Disposition  [1] Loss of speech or garbled speech AND [2] gradual onset (e.g., days to weeks) AND [3] present now  Answer Assessment - Initial Assessment Questions 1. SYMPTOM: What is the main symptom you are concerned about? (e.g., weakness, numbness)     Numbness from neuropathy from knees down to feet, arms and hands.  Reports numbness is worse in her knees down to her feet.  2. ONSET: When did this start? (e.g., minutes, hours, days; while sleeping)     Started a week ago-has been out of gabapentin -reports she has requested her medication through her pharmacy 3. LAST NORMAL: When was the last time you (the patient) were normal (no symptoms)?     Last took her medication a week ago 4. PATTERN Does this come and go, or has it been constant since it started?  Is it present now?     Started shortly after running out of medication-had gotten worse the longer she has been out of medication.  5. CARDIAC SYMPTOMS: Have you had any of the following symptoms: chest pain, difficulty breathing, palpitations?     no 6. NEUROLOGIC SYMPTOMS: Have you had any of the following symptoms: headache, dizziness, vision loss, double vision, changes in speech, unsteady on your feet?     no 7. OTHER SYMPTOMS: Do you have any other symptoms?     No  Patient is asking for Gabapentin  to be filled. Patient has been out of medication for a week. Patient's neuropathy has gotten worse with not taking her medication. Patient is needing a phone call back  from office.  Protocols used: Neurologic Deficit-A-AH

## 2024-04-12 NOTE — Telephone Encounter (Signed)
 Copied from CRM [id number]. Topic: Clinical - Medication Refill >> Apr 12, 2024  8:28 AM Mia F wrote: Medication: gabapentin  (NEURONTIN ) 300 MG capsule  Omeprazole  40 MG   Has the patient contacted their pharmacy? Yes (Agent: If no, request that the patient contact the pharmacy for the refill. If patient does not wish to contact the pharmacy document the reason why and proceed with request.) (Agent: If yes, when and what did the pharmacy advise?)  This is the patient's preferred pharmacy:  [facility] [phone] - [place], [place] - [address] AT [facility] [address] [place] [address] Phone: [phone] Fax: [phone]  Is this the correct pharmacy for this prescription? Yes If no, delete pharmacy and type the correct one.   Has the prescription been filled recently? No  Is the patient out of the medication? Yes  Has the patient been seen for an appointment in the last year OR does the patient have an upcoming appointment? Yes  Can we respond through MyChart? Yes  Agent: Please be advised that Rx refills may take up to 3 business days. We ask that you follow-up with your pharmacy.

## 2024-04-17 ENCOUNTER — Encounter: Payer: Self-pay | Admitting: Neurology

## 2024-04-17 ENCOUNTER — Ambulatory Visit: Admitting: Neurology

## 2024-04-18 ENCOUNTER — Ambulatory Visit (INDEPENDENT_AMBULATORY_CARE_PROVIDER_SITE_OTHER): Admitting: Otolaryngology

## 2024-04-19 ENCOUNTER — Other Ambulatory Visit: Payer: Self-pay | Admitting: Family Medicine

## 2024-05-13 ENCOUNTER — Other Ambulatory Visit (INDEPENDENT_AMBULATORY_CARE_PROVIDER_SITE_OTHER): Payer: Self-pay | Admitting: Otolaryngology

## 2024-05-14 ENCOUNTER — Other Ambulatory Visit: Payer: Self-pay

## 2024-05-14 ENCOUNTER — Encounter (HOSPITAL_COMMUNITY): Payer: Self-pay

## 2024-05-14 ENCOUNTER — Inpatient Hospital Stay (HOSPITAL_COMMUNITY)
Admission: EM | Admit: 2024-05-14 | Discharge: 2024-05-17 | DRG: 690 | Disposition: A | Discharge status: Home Health | Attending: Internal Medicine | Admitting: Internal Medicine

## 2024-05-14 DIAGNOSIS — Z9851 Tubal ligation status: Secondary | ICD-10-CM

## 2024-05-14 DIAGNOSIS — T50905A Adverse effect of unspecified drugs, medicaments and biological substances, initial encounter: Secondary | ICD-10-CM | POA: Diagnosis not present

## 2024-05-14 DIAGNOSIS — E139 Other specified diabetes mellitus without complications: Secondary | ICD-10-CM | POA: Diagnosis present

## 2024-05-14 DIAGNOSIS — K5909 Other constipation: Secondary | ICD-10-CM | POA: Diagnosis present

## 2024-05-14 DIAGNOSIS — Z90710 Acquired absence of both cervix and uterus: Secondary | ICD-10-CM

## 2024-05-14 DIAGNOSIS — R159 Full incontinence of feces: Secondary | ICD-10-CM | POA: Diagnosis present

## 2024-05-14 DIAGNOSIS — Z79899 Other long term (current) drug therapy: Secondary | ICD-10-CM

## 2024-05-14 DIAGNOSIS — Z87891 Personal history of nicotine dependence: Secondary | ICD-10-CM

## 2024-05-14 DIAGNOSIS — R634 Abnormal weight loss: Secondary | ICD-10-CM | POA: Diagnosis present

## 2024-05-14 DIAGNOSIS — K219 Gastro-esophageal reflux disease without esophagitis: Secondary | ICD-10-CM | POA: Diagnosis present

## 2024-05-14 DIAGNOSIS — Z23 Encounter for immunization: Secondary | ICD-10-CM

## 2024-05-14 DIAGNOSIS — N3000 Acute cystitis without hematuria: Secondary | ICD-10-CM

## 2024-05-14 DIAGNOSIS — E876 Hypokalemia: Secondary | ICD-10-CM | POA: Diagnosis not present

## 2024-05-14 DIAGNOSIS — E869 Volume depletion, unspecified: Secondary | ICD-10-CM | POA: Diagnosis present

## 2024-05-14 DIAGNOSIS — Z794 Long term (current) use of insulin: Secondary | ICD-10-CM | POA: Diagnosis not present

## 2024-05-14 DIAGNOSIS — R197 Diarrhea, unspecified: Secondary | ICD-10-CM | POA: Diagnosis present

## 2024-05-14 DIAGNOSIS — R739 Hyperglycemia, unspecified: Secondary | ICD-10-CM

## 2024-05-14 DIAGNOSIS — E871 Hypo-osmolality and hyponatremia: Secondary | ICD-10-CM | POA: Diagnosis present

## 2024-05-14 DIAGNOSIS — E1365 Other specified diabetes mellitus with hyperglycemia: Secondary | ICD-10-CM | POA: Diagnosis present

## 2024-05-14 DIAGNOSIS — E785 Hyperlipidemia, unspecified: Secondary | ICD-10-CM | POA: Diagnosis present

## 2024-05-14 DIAGNOSIS — Z7901 Long term (current) use of anticoagulants: Secondary | ICD-10-CM | POA: Diagnosis not present

## 2024-05-14 DIAGNOSIS — Z981 Arthrodesis status: Secondary | ICD-10-CM

## 2024-05-14 DIAGNOSIS — E1165 Type 2 diabetes mellitus with hyperglycemia: Secondary | ICD-10-CM

## 2024-05-14 DIAGNOSIS — Z6829 Body mass index (BMI) 29.0-29.9, adult: Secondary | ICD-10-CM

## 2024-05-14 DIAGNOSIS — I480 Paroxysmal atrial fibrillation: Secondary | ICD-10-CM | POA: Diagnosis present

## 2024-05-14 DIAGNOSIS — I10 Essential (primary) hypertension: Secondary | ICD-10-CM | POA: Diagnosis present

## 2024-05-14 DIAGNOSIS — R627 Adult failure to thrive: Secondary | ICD-10-CM | POA: Diagnosis present

## 2024-05-14 DIAGNOSIS — Z87440 Personal history of urinary (tract) infections: Secondary | ICD-10-CM

## 2024-05-14 DIAGNOSIS — N390 Urinary tract infection, site not specified: Secondary | ICD-10-CM | POA: Diagnosis present

## 2024-05-14 DIAGNOSIS — N3001 Acute cystitis with hematuria: Principal | ICD-10-CM

## 2024-05-14 DIAGNOSIS — Z882 Allergy status to sulfonamides status: Secondary | ICD-10-CM

## 2024-05-14 DIAGNOSIS — Z888 Allergy status to other drugs, medicaments and biological substances status: Secondary | ICD-10-CM

## 2024-05-14 DIAGNOSIS — Z7985 Long-term (current) use of injectable non-insulin antidiabetic drugs: Secondary | ICD-10-CM

## 2024-05-14 DIAGNOSIS — Z8719 Personal history of other diseases of the digestive system: Secondary | ICD-10-CM

## 2024-05-14 LAB — URINALYSIS, ROUTINE W REFLEX MICROSCOPIC
Bilirubin Urine: NEGATIVE
Glucose, UA: >=500 mg/dL — AB
Ketones, ur: NEGATIVE mg/dL
Nitrite: POSITIVE — AB
Protein, ur: 100 mg/dL — AB
Specific Gravity, Urine: 1.007 (ref 1.005–1.030)
WBC, UA: >50 WBC/hpf (ref 0–5)
pH: 5 (ref 5.0–8.0)

## 2024-05-14 LAB — I-STAT CHEM 8, ED
BUN: 13 mg/dL (ref 8–23)
Calcium, Ion: 1.08 mmol/L — ABNORMAL LOW (ref 1.15–1.40)
Chloride: 97 mmol/L — ABNORMAL LOW (ref 98–111)
Creatinine, Ser: 1.2 mg/dL — ABNORMAL HIGH (ref 0.44–1.00)
Glucose, Bld: 448 mg/dL — ABNORMAL HIGH (ref 70–99)
HCT: 28 % — ABNORMAL LOW (ref 36.0–46.0)
Hemoglobin: 9.5 g/dL — ABNORMAL LOW (ref 12.0–15.0)
Potassium: 3.3 mmol/L — ABNORMAL LOW (ref 3.5–5.1)
Sodium: 132 mmol/L — ABNORMAL LOW (ref 135–145)
TCO2: 20 mmol/L — ABNORMAL LOW (ref 22–32)

## 2024-05-14 LAB — CBC
HCT: 29.2 % — ABNORMAL LOW (ref 36.0–46.0)
Hemoglobin: 8.7 g/dL — ABNORMAL LOW (ref 12.0–15.0)
MCH: 24 pg — ABNORMAL LOW (ref 26.0–34.0)
MCHC: 29.8 g/dL — ABNORMAL LOW (ref 30.0–36.0)
MCV: 80.4 fL (ref 80.0–100.0)
Platelets: 292 K/uL (ref 150–400)
RBC: 3.63 MIL/uL — ABNORMAL LOW (ref 3.87–5.11)
RDW: 17.5 % — ABNORMAL HIGH (ref 11.5–15.5)
WBC: 10.4 K/uL (ref 4.0–10.5)
nRBC: 0 % (ref 0.0–0.2)

## 2024-05-14 LAB — COMPREHENSIVE METABOLIC PANEL WITH GFR
ALT: 6 U/L (ref 0–44)
AST: 12 U/L — ABNORMAL LOW (ref 15–41)
Albumin: 2.7 g/dL — ABNORMAL LOW (ref 3.5–5.0)
Alkaline Phosphatase: 139 U/L — ABNORMAL HIGH (ref 38–126)
Anion gap: 12 (ref 5–15)
BUN: 15 mg/dL (ref 8–23)
CO2: 20 mmol/L — ABNORMAL LOW (ref 22–32)
Calcium: 8.7 mg/dL — ABNORMAL LOW (ref 8.9–10.3)
Chloride: 97 mmol/L — ABNORMAL LOW (ref 98–111)
Creatinine, Ser: 1.2 mg/dL — ABNORMAL HIGH (ref 0.44–1.00)
GFR, Estimated: 49 mL/min — ABNORMAL LOW (ref >60)
Glucose, Bld: 436 mg/dL — ABNORMAL HIGH (ref 70–99)
Potassium: 3.4 mmol/L — ABNORMAL LOW (ref 3.5–5.1)
Sodium: 129 mmol/L — ABNORMAL LOW (ref 135–145)
Total Bilirubin: 0.4 mg/dL (ref 0.0–1.2)
Total Protein: 6.2 g/dL — ABNORMAL LOW (ref 6.5–8.1)

## 2024-05-14 LAB — GLUCOSE, CAPILLARY: Glucose-Capillary: 197 mg/dL — ABNORMAL HIGH (ref 70–99)

## 2024-05-14 LAB — C DIFFICILE QUICK SCREEN W PCR REFLEX
C Diff antigen: POSITIVE — AB
C Diff toxin: NEGATIVE

## 2024-05-14 LAB — CBG MONITORING, ED
Glucose-Capillary: 409 mg/dL — ABNORMAL HIGH (ref 70–99)
Glucose-Capillary: 354 mg/dL — ABNORMAL HIGH (ref 70–99)
Glucose-Capillary: 182 mg/dL — ABNORMAL HIGH (ref 70–99)

## 2024-05-14 LAB — MRSA NEXT GEN BY PCR, NASAL: MRSA by PCR Next Gen: NOT DETECTED

## 2024-05-14 LAB — BETA-HYDROXYBUTYRIC ACID: Beta-Hydroxybutyric Acid: 0.29 mmol/L — ABNORMAL HIGH (ref 0.05–0.27)

## 2024-05-14 LAB — CLOSTRIDIUM DIFFICILE BY PCR, REFLEXED
Hypervirulent Strain: NEGATIVE
Toxigenic C. Difficile by PCR: POSITIVE — AB

## 2024-05-14 MED ORDER — POTASSIUM CHLORIDE CRYS ER 20 MEQ PO TBCR
40.0000 meq | EXTENDED_RELEASE_TABLET | Freq: Once | ORAL | Status: AC
  Administered 2024-05-14: 40 meq via ORAL
  Filled 2024-05-14: qty 2

## 2024-05-14 MED ORDER — INSULIN ASPART 100 UNIT/ML IJ SOLN
8.0000 [IU] | Freq: Once | INTRAMUSCULAR | Status: AC
  Administered 2024-05-14: 8 [IU] via SUBCUTANEOUS
  Filled 2024-05-14: qty 1

## 2024-05-14 MED ORDER — SERTRALINE HCL 50 MG PO TABS
50.0000 mg | ORAL_TABLET | Freq: Every day | ORAL | Status: DC
  Administered 2024-05-14 – 2024-05-17 (×4): 50 mg via ORAL
  Filled 2024-05-14 (×4): qty 1

## 2024-05-14 MED ORDER — OXYCODONE-ACETAMINOPHEN 5-325 MG PO TABS: ORAL_TABLET | Freq: Three times a day (TID) | ORAL | Status: DC | PRN

## 2024-05-14 MED ORDER — SODIUM CHLORIDE 0.9 % IV SOLN
1.0000 g | Freq: Once | INTRAVENOUS | Status: AC
  Administered 2024-05-14: 1 g via INTRAVENOUS
  Filled 2024-05-14: qty 1000

## 2024-05-14 MED ORDER — OXYCODONE-ACETAMINOPHEN 5-325 MG PO TABS
1.0000 | ORAL_TABLET | Freq: Three times a day (TID) | ORAL | Status: DC | PRN
  Administered 2024-05-14 – 2024-05-16 (×3): 1 via ORAL
  Filled 2024-05-14 (×3): qty 1

## 2024-05-14 MED ORDER — TRAZODONE HCL 50 MG PO TABS
100.0000 mg | ORAL_TABLET | Freq: Every day | ORAL | Status: DC
  Administered 2024-05-14 – 2024-05-16 (×3): 100 mg via ORAL
  Filled 2024-05-14 (×3): qty 2

## 2024-05-14 MED ORDER — INFLUENZA VAC SPLIT HIGH-DOSE 0.5 ML IM SUSY
0.5000 mL | PREFILLED_SYRINGE | INTRAMUSCULAR | Status: AC
Start: 2024-05-15 — End: 2024-05-17
  Administered 2024-05-17: 0.5 mL via INTRAMUSCULAR
  Filled 2024-05-14: qty 0.5

## 2024-05-14 MED ORDER — MIRABEGRON ER 25 MG PO TB24
25.0000 mg | ORAL_TABLET | Freq: Every day | ORAL | Status: DC
  Administered 2024-05-14: 25 mg via ORAL
  Filled 2024-05-14: qty 1

## 2024-05-14 MED ORDER — ONDANSETRON HCL 4 MG/2ML IJ SOLN
4.0000 mg | Freq: Four times a day (QID) | INTRAMUSCULAR | Status: DC | PRN
  Administered 2024-05-14: 4 mg via INTRAVENOUS
  Filled 2024-05-14: qty 2

## 2024-05-14 MED ORDER — APIXABAN 5 MG PO TABS
5.0000 mg | ORAL_TABLET | Freq: Two times a day (BID) | ORAL | Status: DC
  Administered 2024-05-14 – 2024-05-17 (×6): 5 mg via ORAL
  Filled 2024-05-14 (×6): qty 1

## 2024-05-14 MED ORDER — FAMOTIDINE 20 MG PO TABS
20.0000 mg | ORAL_TABLET | Freq: Every day | ORAL | Status: DC
  Administered 2024-05-14 – 2024-05-16 (×3): 20 mg via ORAL
  Filled 2024-05-14 (×3): qty 1

## 2024-05-14 MED ORDER — POTASSIUM CHLORIDE 20 MEQ PO PACK
40.0000 meq | PACK | Freq: Once | ORAL | Status: AC
  Administered 2024-05-14: 40 meq via ORAL
  Filled 2024-05-14: qty 2

## 2024-05-14 MED ORDER — INSULIN ASPART 100 UNIT/ML IJ SOLN: 8.0000 [IU] | Freq: Once | INTRAMUSCULAR | Status: DC

## 2024-05-14 MED ORDER — ACETAMINOPHEN 650 MG RE SUPP: 650.0000 mg | Freq: Four times a day (QID) | RECTAL | Status: DC | PRN

## 2024-05-14 MED ORDER — ACETAMINOPHEN 325 MG PO TABS: 650.0000 mg | ORAL_TABLET | Freq: Four times a day (QID) | ORAL | Status: DC | PRN

## 2024-05-14 MED ORDER — INFLUENZA VAC SPLIT HIGH-DOSE 0.5 ML IM SUSY: 0.5000 mL | PREFILLED_SYRINGE | INTRAMUSCULAR | Status: DC

## 2024-05-14 MED ORDER — ONDANSETRON HCL 4 MG PO TABS: 4.0000 mg | ORAL_TABLET | Freq: Four times a day (QID) | ORAL | Status: DC | PRN

## 2024-05-14 MED ORDER — INSULIN ASPART 100 UNIT/ML IJ SOLN
0.0000 [IU] | Freq: Three times a day (TID) | INTRAMUSCULAR | Status: DC
  Administered 2024-05-15: 2 [IU] via SUBCUTANEOUS
  Administered 2024-05-15: 5 [IU] via SUBCUTANEOUS
  Administered 2024-05-15: 4 [IU] via SUBCUTANEOUS
  Administered 2024-05-16 – 2024-05-17 (×3): 1 [IU] via SUBCUTANEOUS

## 2024-05-14 MED ORDER — CHLORHEXIDINE GLUCONATE CLOTH 2 % EX PADS
6.0000 | MEDICATED_PAD | Freq: Every day | CUTANEOUS | Status: DC
  Administered 2024-05-14: 6 via TOPICAL

## 2024-05-14 MED ORDER — LACTATED RINGERS IV BOLUS
1000.0000 mL | Freq: Once | INTRAVENOUS | Status: AC
  Administered 2024-05-14: 1000 mL via INTRAVENOUS

## 2024-05-14 MED ORDER — ENSURE PLUS HIGH PROTEIN PO LIQD
237.0000 mL | Freq: Two times a day (BID) | ORAL | Status: DC
  Administered 2024-05-15 – 2024-05-17 (×6): 237 mL via ORAL

## 2024-05-14 NOTE — ED Provider Notes (Signed)
 [place] EMERGENCY DEPARTMENT AT [facility] Provider Note   CSN: [id number] Arrival date & time: 05/14/24  1129     Patient presents with: Hyperglycemia   Amy Livingston is a 68 y.o. female.  {Add pertinent medical, surgical, social history, OB history to HPI:32947}  Hyperglycemia      Prior to Admission medications   Medication Sig Start Date End Date Taking? Authorizing Provider  acetaminophen  (TYLENOL ) 325 MG tablet Take 2 tablets (650 mg total) by mouth every 6 (six) hours as needed for mild pain (pain score 1-3) or fever (or Fever >/= 101). 03/15/24   Emokpae, Courage, MD  Ascorbic Acid (VITAMIN C ) 100 MG tablet Take 1 tablet (100 mg total) by mouth daily. 04/02/24   Guadlupe Lianne DASEN, MD  atorvastatin  (LIPITOR) 80 MG tablet TAKE 1 TABLET(80 MG) BY MOUTH DAILY 02/28/24   Bacchus, Gloria Z, FNP  Boric Acid 600 MG SUPP Place 1 capsule vaginally at bedtime. DO NOT TAKE BY MOUTH 04/03/24   Guadlupe Lianne T, MD  busPIRone  (BUSPAR ) 10 MG tablet Take 1 tablet (10 mg total) by mouth 2 (two) times daily. 06/16/23   Bacchus, Meade PEDLAR, FNP  Cholecalciferol (VITAMIN D -3) 25 MCG (1000 UT) CAPS Take 1,000 Units by mouth 2 (two) times daily at 10 am and 4 pm.    [provider]  cyclobenzaprine  (FLEXERIL ) 10 MG tablet Take 10 mg by mouth every 12 (twelve) hours.    [provider]  ELIQUIS  5 MG TABS tablet Take 5 mg by mouth 2 (two) times daily.    [provider]  estradiol  (ESTRACE ) 0.1 MG/GM vaginal cream Place 0.5 g vaginally 2 (two) times a week. Place 0.5g nightly for two weeks then twice a week after 04/05/24   Guadlupe Lianne T, MD  famotidine  (PEPCID ) 20 MG tablet Take 1 tablet (20 mg total) by mouth daily. 01/27/24   Bacchus, Meade PEDLAR, FNP  gabapentin  (NEURONTIN ) 300 MG capsule Take 1 capsule (300 mg total) by mouth 3 (three) times daily. 04/12/24   Bacchus, Gloria Z, FNP  glucose blood (ACCU-CHEK GUIDE TEST) test strip USE TO TEST BLOOD SUGAR EVERY MORNING, AT NOON, AND  EVERY NIGHT AT BEDTIME. 12/06/23   Bacchus, Meade PEDLAR, FNP  insulin  isophane & regular human KwikPen (NOVOLIN  70/30 KWIKPEN) (70-30) 100 UNIT/ML KwikPen Inject 30 Units into the skin 2 (two) times daily with a meal. 03/22/24   Bacchus, Meade PEDLAR, FNP  meclizine  (ANTIVERT ) 25 MG tablet Take 1 tablet (25 mg total) by mouth daily as needed for dizziness. 03/22/24   Bacchus, Gloria Z, FNP  methenamine  (HIPREX ) 1 g tablet Take 1 tablet (1 g total) by mouth 2 (two) times daily with a meal. 04/02/24   Guadlupe Lianne T, MD  mirabegron  ER (MYRBETRIQ ) 25 MG TB24 tablet Take 1 tablet (25 mg total) by mouth at bedtime. 11/06/23   Johnson, Clanford L, MD  mometasone  (ELOCON ) 0.1 % cream Apply topically daily as needed for itch 02/15/24   Karis Clunes, MD  omeprazole  (PRILOSEC) 40 MG capsule TAKE 1 CAPSULE(40 MG) BY MOUTH DAILY 04/20/24   Bacchus, Gloria Z, FNP  ondansetron  (ZOFRAN ) 4 MG tablet Take 1 tablet (4 mg total) by mouth every 6 (six) hours as needed for nausea. 10/14/23   Pearlean Manus, MD  oxyCODONE -acetaminophen  (PERCOCET) 10-325 MG tablet Take 1 tablet by mouth every 8 (eight) hours as needed for pain.    [provider]  pantoprazole  (PROTONIX ) 40 MG tablet Take 1 tablet (40  mg total) by mouth as needed. 03/22/24   Bacchus, Gloria Z, FNP  sertraline  (ZOLOFT ) 50 MG tablet TAKE 1 TABLET(50 MG) BY MOUTH DAILY 02/28/24   Bacchus, Gloria Z, FNP  sucralfate  (CARAFATE ) 1 g tablet Take 1 tablet (1 g total) by mouth 4 (four) times daily -  with meals and at bedtime. 01/27/24   Bacchus, Meade PEDLAR, FNP  tamsulosin  (FLOMAX ) 0.4 MG CAPS capsule Take 1 capsule (0.4 mg total) by mouth daily after supper. 03/15/24   Pearlean Manus, MD  tirzepatide  (MOUNJARO ) 15 MG/0.5ML Pen Inject 15 mg into the skin once a week. 03/22/24   Bacchus, Meade PEDLAR, FNP  traZODone  (DESYREL ) 100 MG tablet Take 1 tablet (100 mg total) by mouth at bedtime. 03/22/24   Bacchus, Meade PEDLAR, FNP    Allergies: Elemental sulfur and Metformin  and related     Review of Systems  Updated Vital Signs BP (!) 142/67   Pulse 94   Temp 97.7 F (36.5 C) (Oral)   Resp 10   Ht 5' 6 (1.676 m)   Wt 83.9 kg   SpO2 97%   BMI 29.86 kg/m   Physical Exam  (all labs ordered are listed, but only abnormal results are displayed) Labs Reviewed  CBC - Abnormal; Notable for the following components:      Result Value   RBC 3.63 (*)    Hemoglobin 8.7 (*)    HCT 29.2 (*)    MCH 24.0 (*)    MCHC 29.8 (*)    RDW 17.5 (*)    All other components within normal limits  CBG MONITORING, ED - Abnormal; Notable for the following components:   Glucose-Capillary 409 (*)    All other components within normal limits  I-STAT CHEM 8, ED - Abnormal; Notable for the following components:   Sodium 132 (*)    Potassium 3.3 (*)    Chloride 97 (*)    Creatinine, Ser 1.20 (*)    Glucose, Bld 448 (*)    Calcium , Ion 1.08 (*)    TCO2 20 (*)    Hemoglobin 9.5 (*)    HCT 28.0 (*)    All other components within normal limits  URINALYSIS, ROUTINE W REFLEX MICROSCOPIC  BETA-HYDROXYBUTYRIC ACID  CBG MONITORING, ED    EKG: None  Radiology: No results found.  {Document cardiac monitor, telemetry assessment procedure when appropriate:32947} Procedures   Medications Ordered in the ED  lactated ringers  bolus 1,000 mL (1,000 mLs Intravenous New Bag/Given 05/14/24 1207)      {Click here for ABCD2, HEART and other calculators REFRESH Note before signing:1}                              Medical Decision Making Amount and/or Complexity of Data Reviewed Labs: ordered.  Risk Prescription drug management.   ***  {Document critical care time when appropriate  Document review of labs and clinical decision tools ie CHADS2VASC2, etc  Document your independent review of radiology images and any outside records  Document your discussion with family members, caretakers and with consultants  Document social determinants of health affecting pt's care  Document your  decision making why or why not admission, treatments were needed:32947:::1}   Final diagnoses:  None    ED Discharge Orders     None

## 2024-05-14 NOTE — ED Notes (Signed)
 Pt up to toilet with assistance, both urine and stool contaminated each other in hats. Will attempt again when pt ready.

## 2024-05-14 NOTE — ED Notes (Signed)
 Unable to draw labs from IV stick

## 2024-05-14 NOTE — ED Triage Notes (Signed)
 Pt BIB ems for weakness for 2 days and hyperglycemia per EMS 558. Pt states she has not taken her medication for her diabetes because she had been sleeping and just feeling sick. Pt reports diarrhea, fever, chills. Pt denies n/v.

## 2024-05-14 NOTE — H&P (Addendum)
 History and Physical    Patient: Amy Livingston FMW:[id number] DOB: 1956-05-10 DOA: 05/14/2024 DOS: the patient was seen and examined on 05/14/2024 PCP: Edman Meade PEDLAR, FNP  Patient coming from: Home  Chief Complaint:  Chief Complaint  Patient presents with   Hyperglycemia   HPI: Amy Livingston is a 68 y.o. female with medical history significant diabetes mellitus T2 insulin  requiring also on Monjaro, paroxysmal atrial fibrillation on Eliquis , C. difficile colitis with completion of oral Vanco 7 months ago,  chronic urinary retention, recurrent UTIs with ESBL Klebsiella last month.  She completed 14 days of IV ertapenem  3 weeks ago. Patient presents to the ED this evening with lower abdominal pain and 4 days of diarrhea.  She also reports intermittent fevers and chills over the last couple of days.  She has had a change in her bowel habits with bowel movements she describes as little balls and trouble with increased gas.  She is having excessive cramping prior to her diarrheal stools also.  No chest pain or shortness of breath.  No nausea or vomiting. The patient has a long history of constipation prior to her C. Difficile.  Previously was on Linzess.  The last year she has lost greater than 130 pounds and she says food does not appeal to her at all.  She is currently on Mounjaro . Her evaluation in the emergency department revealed a UTI.  And because of her recent ESBL Klebsiella she was started on Invanz .  She will be admitted for IV antibiotics.  The recurrent diarrhea over the last 4 days is concerning in this patient who has a history of C. difficile.  Her C. difficile antigen is positive but her toxin is negative.   Review of Systems: As mentioned in the history of present illness. All other systems reviewed and are negative. Past Medical History:  Diagnosis Date   Anxiety    Atrial fibrillation (HCC)    Diabetes mellitus without complication (HCC)    Dysrhythmia    GERD  (gastroesophageal reflux disease)    Hypertension    PONV (postoperative nausea and vomiting)    Shortness of breath dyspnea    Past Surgical History:  Procedure Laterality Date   ABDOMINAL HYSTERECTOMY     ANTERIOR CERVICAL DECOMP/DISCECTOMY FUSION N/A 01/13/2016   Procedure: C5-6 ANTERIOR CERVICAL DECOMPRESSION/DISKECTOMY/FUSION;  Surgeon: Victory Gens, MD;  Location: [facility];  Service: Neurosurgery;  Laterality: N/A;  C5-6 ANTERIOR CERVICAL DECOMPRESSION/DISKECTOMY/FUSION   BACK SURGERY     x 5   BIOPSY  07/25/2023   Procedure: BIOPSY;  Surgeon: Cindie Carlin POUR, DO;  Location: [facility];  Service: Endoscopy;;   CHOLECYSTECTOMY     COLONOSCOPY WITH PROPOFOL  N/A 07/25/2023   Procedure: COLONOSCOPY WITH PROPOFOL ;  Surgeon: Cindie Carlin POUR, DO;  Location: [facility];  Service: Endoscopy;  Laterality: N/A;  10:45 am, asa 3   ESOPHAGEAL BRUSHING  07/25/2023   Procedure: ESOPHAGEAL BRUSHING;  Surgeon: Cindie Carlin POUR, DO;  Location: [facility];  Service: Endoscopy;;   ESOPHAGOGASTRODUODENOSCOPY (EGD) WITH PROPOFOL  N/A 07/25/2023   Procedure: ESOPHAGOGASTRODUODENOSCOPY (EGD) WITH PROPOFOL ;  Surgeon: Cindie Carlin POUR, DO;  Location: [facility];  Service: Endoscopy;  Laterality: N/A;   FOOT SURGERY     KNEE ARTHROSCOPY     left   KNEE SURGERY Right    POLYPECTOMY  07/25/2023   Procedure: POLYPECTOMY INTESTINAL;  Surgeon: Cindie Carlin POUR, DO;  Location: [facility];  Service: Endoscopy;;   TEE WITHOUT CARDIOVERSION N/A  10/13/2023   Procedure: ECHOCARDIOGRAM, TRANSESOPHAGEAL;  Surgeon: Stacia Diannah SQUIBB, MD;  Location: [facility];  Service: Cardiovascular;  Laterality: N/A;   TUBAL LIGATION     Social History:  reports that she has quit smoking. Her smoking use included cigarettes. She started smoking about 17 years ago. She has never used smokeless tobacco. She reports that she does not drink alcohol and does not use drugs.  Allergies  Allergen Reactions    Elemental Sulfur Itching and Rash    Yeast infections   Metformin  And Related Diarrhea    GI distress    Family History  Problem Relation Age of Onset   Renal cancer Neg Hx    Bladder Cancer Neg Hx    Uterine cancer Neg Hx     Prior to Admission medications   Medication Sig Start Date End Date Taking? Authorizing Provider  acetaminophen  (TYLENOL ) 325 MG tablet Take 2 tablets (650 mg total) by mouth every 6 (six) hours as needed for mild pain (pain score 1-3) or fever (or Fever >/= 101). 03/15/24  Yes Emokpae, Courage, MD  atorvastatin  (LIPITOR) 80 MG tablet TAKE 1 TABLET(80 MG) BY MOUTH DAILY 02/28/24  Yes Bacchus, Gloria Z, FNP  cyclobenzaprine  (FLEXERIL ) 10 MG tablet Take 10 mg by mouth every 12 (twelve) hours.   Yes [provider]  ELIQUIS  5 MG TABS tablet Take 5 mg by mouth 2 (two) times daily.   Yes [provider]  estradiol  (ESTRACE ) 0.1 MG/GM vaginal cream Place 0.5 g vaginally 2 (two) times a week. Place 0.5g nightly for two weeks then twice a week after 04/05/24  Yes Guadlupe Dull T, MD  famotidine  (PEPCID ) 20 MG tablet Take 1 tablet (20 mg total) by mouth daily. 01/27/24  Yes Bacchus, Meade PEDLAR, FNP  gabapentin  (NEURONTIN ) 300 MG capsule Take 1 capsule (300 mg total) by mouth 3 (three) times daily. 04/12/24  Yes Bacchus, Meade PEDLAR, FNP  insulin  isophane & regular human KwikPen (NOVOLIN  70/30 KWIKPEN) (70-30) 100 UNIT/ML KwikPen Inject 30 Units into the skin 2 (two) times daily with a meal. 03/22/24  Yes Bacchus, Meade PEDLAR, FNP  meclizine  (ANTIVERT ) 25 MG tablet Take 1 tablet (25 mg total) by mouth daily as needed for dizziness. 03/22/24  Yes Bacchus, Meade PEDLAR, FNP  mirabegron  ER (MYRBETRIQ ) 25 MG TB24 tablet Take 1 tablet (25 mg total) by mouth at bedtime. 11/06/23  Yes Johnson, Clanford L, MD  mometasone  (ELOCON ) 0.1 % cream Apply topically daily as needed for itch 02/15/24  Yes Teoh, Su, MD  omeprazole  (PRILOSEC) 40 MG capsule TAKE 1 CAPSULE(40 MG) BY MOUTH DAILY 04/20/24  Yes  Bacchus, Gloria Z, FNP  ondansetron  (ZOFRAN ) 4 MG tablet Take 1 tablet (4 mg total) by mouth every 6 (six) hours as needed for nausea. 10/14/23  Yes Emokpae, Courage, MD  oxyCODONE -acetaminophen  (PERCOCET) 10-325 MG tablet Take 1 tablet by mouth every 8 (eight) hours as needed for pain.   Yes [provider]  pantoprazole  (PROTONIX ) 40 MG tablet Take 1 tablet (40 mg total) by mouth as needed. 03/22/24  Yes Bacchus, Meade PEDLAR, FNP  sertraline  (ZOLOFT ) 50 MG tablet TAKE 1 TABLET(50 MG) BY MOUTH DAILY 02/28/24  Yes Bacchus, Gloria Z, FNP  sucralfate  (CARAFATE ) 1 g tablet Take 1 tablet (1 g total) by mouth 4 (four) times daily -  with meals and at bedtime. 01/27/24  Yes Bacchus, Meade PEDLAR, FNP  traZODone  (DESYREL ) 100 MG tablet Take 1 tablet (100 mg total) by mouth at bedtime. 03/22/24  Yes Bacchus, Gloria Z, FNP  Ascorbic Acid (VITAMIN C ) 100 MG tablet Take 1 tablet (100 mg total) by mouth daily. 04/02/24   Guadlupe Lianne DASEN, MD  busPIRone  (BUSPAR ) 10 MG tablet Take 1 tablet (10 mg total) by mouth 2 (two) times daily. 06/16/23   Bacchus, Meade PEDLAR, FNP  glucose blood (ACCU-CHEK GUIDE TEST) test strip USE TO TEST BLOOD SUGAR EVERY MORNING, AT NOON, AND EVERY NIGHT AT BEDTIME. 12/06/23   Bacchus, Meade PEDLAR, FNP  tirzepatide  (MOUNJARO ) 15 MG/0.5ML Pen Inject 15 mg into the skin once a week. 03/22/24   Edman Meade PEDLAR, FNP    Physical Exam: Vitals:   05/14/24 1830 05/14/24 1845 05/14/24 1900 05/14/24 1915  BP: (!) 141/72 136/65 137/63 131/64  Pulse: 94 92 90 91  Resp: 15 14 15 15   Temp: 98 F (36.7 C)     TempSrc: Oral     SpO2: 98% 98% 97% 98%  Weight:      Height:       Physical Exam:  General: No acute distress, pale, chronically ill appearing HEENT: Normocephalic, atraumatic, PERRL Cardiovascular: Normal rate and rhythm. Distal pulses intact. Pulmonary: Normal pulmonary effort, normal breath sounds Gastrointestinal: Nondistended abdomen, soft, tender diffusely but most in RLQ, normoactive bowel  sounds,  Musculoskeletal:Normal ROM, no lower ext edema Lymphadenopathy: No cervical LAD. Skin: Skin is warm and dry. Neuro: No focal deficits noted, AAOx3. PSYCH: Attentive and cooperative  Data Reviewed:  Results for orders placed or performed during the hospital encounter of 05/14/24 (from the past 24 hours)  CBG monitoring, ED     Status: Abnormal   Collection Time: 05/14/24 11:33 AM  Result Value Ref Range   Glucose-Capillary 409 (H) 70 - 99 mg/dL  CBC     Status: Abnormal   Collection Time: 05/14/24 12:19 PM  Result Value Ref Range   WBC 10.4 4.0 - 10.5 K/uL   RBC 3.63 (L) 3.87 - 5.11 MIL/uL   Hemoglobin 8.7 (L) 12.0 - 15.0 g/dL   HCT 70.7 (L) 63.9 - 53.9 %   MCV 80.4 80.0 - 100.0 fL   MCH 24.0 (L) 26.0 - 34.0 pg   MCHC 29.8 (L) 30.0 - 36.0 g/dL   RDW 82.4 (H) 88.4 - 84.4 %   Platelets 292 150 - 400 K/uL   nRBC 0.0 0.0 - 0.2 %  Beta-hydroxybutyric acid     Status: Abnormal   Collection Time: 05/14/24 12:19 PM  Result Value Ref Range   Beta-Hydroxybutyric Acid 0.29 (H) 0.05 - 0.27 mmol/L  Comprehensive metabolic panel     Status: Abnormal   Collection Time: 05/14/24 12:19 PM  Result Value Ref Range   Sodium 129 (L) 135 - 145 mmol/L   Potassium 3.4 (L) 3.5 - 5.1 mmol/L   Chloride 97 (L) 98 - 111 mmol/L   CO2 20 (L) 22 - 32 mmol/L   Glucose, Bld 436 (H) 70 - 99 mg/dL   BUN 15 8 - 23 mg/dL   Creatinine, Ser 8.79 (H) 0.44 - 1.00 mg/dL   Calcium  8.7 (L) 8.9 - 10.3 mg/dL   Total Protein 6.2 (L) 6.5 - 8.1 g/dL   Albumin 2.7 (L) 3.5 - 5.0 g/dL   AST 12 (L) 15 - 41 U/L   ALT 6 0 - 44 U/L   Alkaline Phosphatase 139 (H) 38 - 126 U/L   Total Bilirubin 0.4 0.0 - 1.2 mg/dL   GFR, Estimated 49 (L) >60 mL/min   Anion gap 12 5 -  15  I-stat chem 8, ed     Status: Abnormal   Collection Time: 05/14/24 12:26 PM  Result Value Ref Range   Sodium 132 (L) 135 - 145 mmol/L   Potassium 3.3 (L) 3.5 - 5.1 mmol/L   Chloride 97 (L) 98 - 111 mmol/L   BUN 13 8 - 23 mg/dL   Creatinine,  Ser 8.79 (H) 0.44 - 1.00 mg/dL   Glucose, Bld 551 (H) 70 - 99 mg/dL   Calcium , Ion 1.08 (L) 1.15 - 1.40 mmol/L   TCO2 20 (L) 22 - 32 mmol/L   Hemoglobin 9.5 (L) 12.0 - 15.0 g/dL   HCT 71.9 (L) 63.9 - 53.9 %  Urinalysis, Routine w reflex microscopic -Urine, Clean Catch     Status: Abnormal   Collection Time: 05/14/24  3:05 PM  Result Value Ref Range   Color, Urine AMBER (A) YELLOW   APPearance TURBID (A) CLEAR   Specific Gravity, Urine 1.007 1.005 - 1.030   pH 5.0 5.0 - 8.0   Glucose, UA >=500 (A) NEGATIVE mg/dL   Hgb urine dipstick LARGE (A) NEGATIVE   Bilirubin Urine NEGATIVE NEGATIVE   Ketones, ur NEGATIVE NEGATIVE mg/dL   Protein, ur 899 (A) NEGATIVE mg/dL   Nitrite POSITIVE (A) NEGATIVE   Leukocytes,Ua SMALL (A) NEGATIVE   RBC / HPF 21-50 0 - 5 RBC/hpf   WBC, UA >50 0 - 5 WBC/hpf   Bacteria, UA MANY (A) NONE SEEN   Squamous Epithelial / HPF 0-5 0 - 5 /HPF   Mucus PRESENT   C Difficile Quick Screen w PCR reflex     Status: Abnormal   Collection Time: 05/14/24  3:05 PM   Specimen: Urine, Catheterized; Stool  Result Value Ref Range   C Diff antigen POSITIVE (A) NEGATIVE   C Diff toxin NEGATIVE NEGATIVE   C Diff interpretation Results are indeterminate. See PCR results.   CBG monitoring, ED     Status: Abnormal   Collection Time: 05/14/24  3:24 PM  Result Value Ref Range   Glucose-Capillary 354 (H) 70 - 99 mg/dL     Assessment and Plan: Recurrent UTI, h/o ESBL - Invanz  started - Has urinary retention with post voids >272ml  Diarrhea x 4 days in setting of chronic constipation and h/o C diff. - Consider ID consult.  - Consider CT of abdomen pelvis   DM - Recommend a break from Monjaro given excessive weight loss of greater than 100 pounds and zero appetite.  She is also trying to establish regular bowel habits after having C. difficile and a long history of severe constipation.  4. Bowel incontinence x 2 months -the patient is now following with [facility].  They  are addressing her frequent UTIs and stool incontinence.  5. H/o Afib - Continue Eliquis   6. FTT -the patient's daughter reports that the patient has had a very rough year medically speaking and is not doing well.   7. Hypokalemia -patient received 80 mEq of oral potassium in the emergency department.  Recheck in a.m. - Check Magnesium  level  8. Hyponatremia due to volume depletion - IV fluids. Monitor   Advance Care Planning:   Code Status: Full Code the patient wants to be full code and names her daughter Amy Livingston is her surrogate decision maker.  Consults: GI, Dr. Franchot  Family Communication: Daughter Amy Livingston at bedside  Severity of Illness: The appropriate patient status for this patient is INPATIENT. Inpatient status is judged to be reasonable and necessary in  order to provide the required intensity of service to ensure the patient's safety. The patient's presenting symptoms, physical exam findings, and initial radiographic and laboratory data in the context of their chronic comorbidities is felt to place them at high risk for further clinical deterioration. Furthermore, it is not anticipated that the patient will be medically stable for discharge from the hospital within 2 midnights of admission.   * I certify that at the point of admission it is my clinical judgment that the patient will require inpatient hospital care spanning beyond 2 midnights from the point of admission due to high intensity of service, high risk for further deterioration and high frequency of surveillance required.*  Author: ARTHEA CHILD, MD 05/14/2024 7:27 PM  For on call review [email or web address].

## 2024-05-15 DIAGNOSIS — N3000 Acute cystitis without hematuria: Secondary | ICD-10-CM | POA: Diagnosis not present

## 2024-05-15 LAB — CBC
HCT: 25.3 % — ABNORMAL LOW (ref 36.0–46.0)
Hemoglobin: 7.6 g/dL — ABNORMAL LOW (ref 12.0–15.0)
MCH: 24.1 pg — ABNORMAL LOW (ref 26.0–34.0)
MCHC: 30 g/dL (ref 30.0–36.0)
MCV: 80.1 fL (ref 80.0–100.0)
Platelets: 269 K/uL (ref 150–400)
RBC: 3.16 MIL/uL — ABNORMAL LOW (ref 3.87–5.11)
RDW: 17.4 % — ABNORMAL HIGH (ref 11.5–15.5)
WBC: 11.1 K/uL — ABNORMAL HIGH (ref 4.0–10.5)
nRBC: 0 % (ref 0.0–0.2)

## 2024-05-15 LAB — BASIC METABOLIC PANEL WITH GFR
Anion gap: 10 (ref 5–15)
BUN: 13 mg/dL (ref 8–23)
CO2: 20 mmol/L — ABNORMAL LOW (ref 22–32)
Calcium: 8.3 mg/dL — ABNORMAL LOW (ref 8.9–10.3)
Chloride: 100 mmol/L (ref 98–111)
Creatinine, Ser: 1.13 mg/dL — ABNORMAL HIGH (ref 0.44–1.00)
GFR, Estimated: 53 mL/min — ABNORMAL LOW (ref >60)
Glucose, Bld: 311 mg/dL — ABNORMAL HIGH (ref 70–99)
Potassium: 3.5 mmol/L (ref 3.5–5.1)
Sodium: 130 mmol/L — ABNORMAL LOW (ref 135–145)

## 2024-05-15 LAB — GASTROINTESTINAL PANEL BY PCR, STOOL (REPLACES STOOL CULTURE)

## 2024-05-15 LAB — GLUCOSE, CAPILLARY
Glucose-Capillary: 323 mg/dL — ABNORMAL HIGH (ref 70–99)
Glucose-Capillary: 320 mg/dL — ABNORMAL HIGH (ref 70–99)
Glucose-Capillary: 203 mg/dL — ABNORMAL HIGH (ref 70–99)
Glucose-Capillary: 118 mg/dL — ABNORMAL HIGH (ref 70–99)

## 2024-05-15 LAB — MAGNESIUM: Magnesium: 1.5 mg/dL — ABNORMAL LOW (ref 1.7–2.4)

## 2024-05-15 MED ORDER — POTASSIUM CHLORIDE CRYS ER 20 MEQ PO TBCR
40.0000 meq | EXTENDED_RELEASE_TABLET | Freq: Once | ORAL | Status: AC
  Administered 2024-05-15: 40 meq via ORAL
  Filled 2024-05-15: qty 2

## 2024-05-15 MED ORDER — IPRATROPIUM-ALBUTEROL 0.5-2.5 (3) MG/3ML IN SOLN: 3.0000 mL | RESPIRATORY_TRACT | Status: DC | PRN

## 2024-05-15 MED ORDER — SODIUM CHLORIDE 0.9 % IV SOLN: INTRAVENOUS | Status: AC

## 2024-05-15 MED ORDER — GABAPENTIN 300 MG PO CAPS
300.0000 mg | ORAL_CAPSULE | Freq: Three times a day (TID) | ORAL | Status: DC
  Administered 2024-05-15 – 2024-05-17 (×7): 300 mg via ORAL
  Filled 2024-05-15 (×7): qty 1

## 2024-05-15 MED ORDER — INSULIN ASPART PROT & ASPART (70-30 MIX) 100 UNIT/ML ~~LOC~~ SUSP
30.0000 [IU] | Freq: Two times a day (BID) | SUBCUTANEOUS | Status: DC
  Administered 2024-05-15 – 2024-05-17 (×5): 30 [IU] via SUBCUTANEOUS
  Filled 2024-05-15: qty 10

## 2024-05-15 MED ORDER — MECLIZINE HCL 12.5 MG PO TABS
25.0000 mg | ORAL_TABLET | Freq: Every day | ORAL | Status: DC | PRN
Start: 2024-05-15 — End: 2024-05-17

## 2024-05-15 MED ORDER — SODIUM CHLORIDE 0.9 % IV SOLN
1.0000 g | INTRAVENOUS | Status: DC
  Administered 2024-05-15: 1 g via INTRAVENOUS
  Filled 2024-05-15 (×4): qty 1000

## 2024-05-15 MED ORDER — HYDRALAZINE HCL 20 MG/ML IJ SOLN: 10.0000 mg | INTRAMUSCULAR | Status: DC | PRN

## 2024-05-15 MED ORDER — ATORVASTATIN CALCIUM 40 MG PO TABS
80.0000 mg | ORAL_TABLET | Freq: Every day | ORAL | Status: DC
  Administered 2024-05-15 – 2024-05-17 (×3): 80 mg via ORAL
  Filled 2024-05-15 (×3): qty 2

## 2024-05-15 MED ORDER — SENNOSIDES-DOCUSATE SODIUM 8.6-50 MG PO TABS: 1.0000 | ORAL_TABLET | Freq: Every evening | ORAL | Status: DC | PRN

## 2024-05-15 MED ORDER — MAGNESIUM SULFATE 2 GM/50ML IV SOLN: 2.0000 g | Freq: Once | INTRAVENOUS | Status: DC

## 2024-05-15 MED ORDER — CYCLOBENZAPRINE HCL 10 MG PO TABS
10.0000 mg | ORAL_TABLET | Freq: Two times a day (BID) | ORAL | Status: DC
  Administered 2024-05-15 – 2024-05-17 (×5): 10 mg via ORAL
  Filled 2024-05-15 (×5): qty 1

## 2024-05-15 MED ORDER — MAGNESIUM SULFATE 4 GM/100ML IV SOLN
4.0000 g | Freq: Once | INTRAVENOUS | Status: AC
  Administered 2024-05-15: 4 g via INTRAVENOUS
  Filled 2024-05-15: qty 100

## 2024-05-15 MED ORDER — VANCOMYCIN HCL 125 MG PO CAPS
125.0000 mg | ORAL_CAPSULE | Freq: Four times a day (QID) | ORAL | Status: DC
  Administered 2024-05-15 – 2024-05-17 (×10): 125 mg via ORAL
  Filled 2024-05-15 (×10): qty 1

## 2024-05-15 MED ORDER — GUAIFENESIN 100 MG/5ML PO LIQD: 5.0000 mL | ORAL | Status: DC | PRN

## 2024-05-15 MED ORDER — BUSPIRONE HCL 5 MG PO TABS
10.0000 mg | ORAL_TABLET | Freq: Two times a day (BID) | ORAL | Status: DC
  Administered 2024-05-15 – 2024-05-17 (×5): 10 mg via ORAL
  Filled 2024-05-15 (×5): qty 2

## 2024-05-15 MED ORDER — CALCIUM GLUCONATE-NACL 2-0.675 GM/100ML-% IV SOLN
2.0000 g | Freq: Once | INTRAVENOUS | Status: DC
  Filled 2024-05-15: qty 100

## 2024-05-15 MED ORDER — CALCIUM GLUCONATE-NACL 1-0.675 GM/50ML-% IV SOLN
1.0000 g | INTRAVENOUS | Status: AC
  Administered 2024-05-15 (×2): 1000 mg via INTRAVENOUS
  Filled 2024-05-15 (×2): qty 50

## 2024-05-15 NOTE — Plan of Care (Signed)
  Problem: Acute Rehab PT Goals(only PT should resolve) Goal: Pt Will Go Supine/Side To Sit Outcome: Progressing Flowsheets (Taken 05/15/2024 1212) Pt will go Supine/Side to Sit: with supervision Goal: Patient Will Transfer Sit To/From Stand Outcome: Progressing Flowsheets (Taken 05/15/2024 1212) Patient will transfer sit to/from stand: with contact guard assist Goal: Pt Will Transfer Bed To Chair/Chair To Bed Outcome: Progressing Flowsheets (Taken 05/15/2024 1212) Pt will Transfer Bed to Chair/Chair to Bed: with contact guard assist Goal: Pt Will Ambulate Outcome: Progressing Flowsheets (Taken 05/15/2024 1212) Pt will Ambulate:  10 feet  with rolling walker  with contact guard assist Goal: Pt Will Go Up/Down Stairs Outcome: Progressing Flowsheets (Taken 05/15/2024 1212) Pt will Go Up / Down Stairs:  3-5 stairs  with rail(s)  with minimal assist    12:13 PM, 05/15/24 Amy Livingston, PT, DPT [place] with [facility]

## 2024-05-15 NOTE — Plan of Care (Signed)

## 2024-05-15 NOTE — Progress Notes (Signed)
 RE: Amy Livingston DOB: 08-13-1955 Date: 05/15/2024  MUST ID: [id number]  To Whom It May Concern:   Please be advised that the above name patient will require a short-term nursing home stay- anticipated 30 days or less rehabilitation and strengthening. The plan is for return home.

## 2024-05-15 NOTE — TOC Initial Note (Signed)
 Transition of Care [facility]) - Initial/Assessment Note    Patient Details  Name: Amy Livingston MRN: [id number] Date of Birth: [birth year]  Transition [facility]) CM/SW Contact:    Lucie Lunger, LCSWA Phone Number: 05/15/2024, 1:55 PM  Clinical Narrative:                 CSW notes PT is recommending SNF for pt at D/C. CSW spoke with pt to review, pt states she is agreeable to SNF referral being sent out for review. Pt wants to keep considering her preference between SNF vs HH. CSW spoke with pt about the difference between the two. Pt is agreeable to referral for SNF at this time. CSW to complete and send out for review. TOC to follow.   Expected Discharge Plan: Skilled Nursing Facility Barriers to Discharge: Continued Medical Work up   Patient Goals and CMS Choice Patient states their goals for this hospitalization and ongoing recovery are:: get better CMS Medicare.gov Compare Post Acute Care list provided to:: Patient Choice offered to / list presented to : Patient      Expected Discharge Plan and Services In-house Referral: Clinical Social Work Discharge Planning Services: CM Consult Post Acute Care Choice: Skilled Nursing Facility Living arrangements for the past 2 months: Apartment                                      Prior Living Arrangements/Services Living arrangements for the past 2 months: Apartment Lives with:: Self Patient language and need for interpreter reviewed:: Yes Do you feel safe going back to the place where you live?: Yes      Need for Family Participation in Patient Care: Yes (Comment) Care giver support system in place?: Yes (comment)   Criminal Activity/Legal Involvement Pertinent to Current Situation/Hospitalization: No - Comment as needed  Activities of Daily Living   ADL Screening (condition at time of admission) Independently performs ADLs?: No Does the patient have a NEW difficulty with bathing/dressing/toileting/self-feeding that  is expected to last >3 days?: Yes (Initiates electronic notice to provider for possible OT consult) Does the patient have a NEW difficulty with getting in/out of bed, walking, or climbing stairs that is expected to last >3 days?: Yes (Initiates electronic notice to provider for possible PT consult) Does the patient have a NEW difficulty with communication that is expected to last >3 days?: No Is the patient deaf or have difficulty hearing?: No Does the patient have difficulty seeing, even when wearing glasses/contacts?: No Does the patient have difficulty concentrating, remembering, or making decisions?: No  Permission Sought/Granted                  Emotional Assessment Appearance:: Appears stated age Attitude/Demeanor/Rapport: Engaged Affect (typically observed): Accepting Orientation: : Oriented to Self, Oriented to  Time, Oriented to Place, Oriented to Situation Alcohol / Substance Use: Not Applicable Psych Involvement: No (comment)  Admission diagnosis:  UTI (urinary tract infection) [N39.0] Patient Active Problem List   Diagnosis Date Noted   UTI (urinary tract infection) 05/14/2024   Weight loss due to medication 05/14/2024   Vulvar irritation 04/02/2024   Incomplete bladder emptying 04/02/2024   Vaginal atrophy 04/02/2024   GERD (gastroesophageal reflux disease) 03/22/2024   Hypomagnesemia 03/11/2024   Severe sepsis (HCC) 03/11/2024   AKI (acute kidney injury) 03/11/2024   Chronic eczematous otitis externa of both ears 02/16/2024   Autonomic dysfunction 01/17/2024  Personal history of fall 12/08/2023   Dizziness 12/08/2023   Insomnia 11/20/2023   Serous otitis media 11/19/2023   Recurrent UTI 11/05/2023   Bacteremia due to other bacteria 10/13/2023   Paroxysmal atrial fibrillation (HCC) 10/09/2023   Anxiety and depression 10/09/2023   Syncope and collapse 10/09/2023   BRBPR (bright red blood per rectum) 10/09/2023   Enteritis due to Clostridium difficile  10/09/2023   Diarrhea of infectious origin 10/09/2023   Sepsis due to gram-negative UTI (HCC) 10/08/2023   Hypokalemia 10/08/2023   Hyponatremia 10/08/2023   Uncontrolled type 2 diabetes mellitus with hyperglycemia, with long-term current use of insulin  (HCC) 10/08/2023   Essential hypertension 10/08/2023   Peripheral neuropathy 10/08/2023   Overactive bladder 06/17/2023   Generalized anxiety disorder 06/17/2023   Type 2 diabetes mellitus with diabetic neuropathy, unspecified (HCC) 06/17/2023   Hypertension 06/01/2023   Urinary incontinence 06/01/2023   Bowel incontinence 06/01/2023   Generalized weakness 06/01/2023   Encounter for immunization 06/01/2023   Diabetes 1.5, 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 [phone] - [place], [place] - [address] AT [facility] [address] [place] [address] Phone: [phone] Fax: [phone]     Social Drivers of Health (SDOH) Social History: SDOH Screenings   Food Insecurity: Food Insecurity Present (05/14/2024)  Housing: Low Risk  (05/14/2024)  Transportation Needs: No Transportation Needs (05/14/2024)  Utilities: Not At Risk (05/14/2024)  Alcohol Screen: Low Risk  (03/21/2024)  Depression (PHQ2-9): Low Risk  (03/22/2024)  Financial Resource Strain: Medium Risk (03/21/2024)  Physical Activity: Inactive (03/21/2024)  Social Connections: Socially Isolated (05/14/2024)  Stress: Stress Concern Present  (03/21/2024)  Tobacco Use: Medium Risk (05/14/2024)  Health Literacy: Adequate Health Literacy (09/28/2023)   SDOH Interventions:     Readmission Risk Interventions    05/15/2024    1:54 PM 03/15/2024   10:34 AM  Readmission Risk Prevention Plan  Transportation Screening Complete Complete  PCP or Specialist Appt within 5-7 Days  Complete  Home Care Screening Complete Complete  Medication Review (RN CM) Complete Complete

## 2024-05-15 NOTE — NC FL2 (Signed)
 [place]  MEDICAID FL2 LEVEL OF CARE FORM     IDENTIFICATION  Patient Name: Amy Livingston Birthdate: [birth year] Sex: female Admission Date (Current Location): 05/14/2024  [facility] and [place] Number:  [employer] and Address:  [facility],  [address], [place] [address]      Provider Number: [id number]  Attending Physician Name and Address:  Caleen Burgess BROCKS, MD  Relative Name and Phone Number:       Current Level of Care: Hospital Recommended Level of Care: Skilled Nursing Facility Prior Approval Number:    Date Approved/Denied:   PASRR Number:    Discharge Plan: SNF    Current Diagnoses: Patient Active Problem List   Diagnosis Date Noted   UTI (urinary tract infection) 05/14/2024   Weight loss due to medication 05/14/2024   Vulvar irritation 04/02/2024   Incomplete bladder emptying 04/02/2024   Vaginal atrophy 04/02/2024   GERD (gastroesophageal reflux disease) 03/22/2024   Hypomagnesemia 03/11/2024   Severe sepsis (HCC) 03/11/2024   AKI (acute kidney injury) 03/11/2024   Chronic eczematous otitis externa of both ears 02/16/2024   Autonomic dysfunction 01/17/2024   Personal history of fall 12/08/2023   Dizziness 12/08/2023   Insomnia 11/20/2023   Serous otitis media 11/19/2023   Recurrent UTI 11/05/2023   Bacteremia due to other bacteria 10/13/2023   Paroxysmal atrial fibrillation (HCC) 10/09/2023   Anxiety and depression 10/09/2023   Syncope and collapse 10/09/2023   BRBPR (bright red blood per rectum) 10/09/2023   Enteritis due to Clostridium difficile 10/09/2023   Diarrhea of infectious origin 10/09/2023   Sepsis due to gram-negative UTI (HCC) 10/08/2023   Hypokalemia 10/08/2023   Hyponatremia 10/08/2023   Uncontrolled type 2 diabetes mellitus with hyperglycemia, with long-term current use of insulin  (HCC) 10/08/2023   Essential hypertension 10/08/2023   Peripheral neuropathy 10/08/2023   Overactive bladder 06/17/2023    Generalized anxiety disorder 06/17/2023   Type 2 diabetes mellitus with diabetic neuropathy, unspecified (HCC) 06/17/2023   Hypertension 06/01/2023   Urinary incontinence 06/01/2023   Bowel incontinence 06/01/2023   Generalized weakness 06/01/2023   Encounter for immunization 06/01/2023   Diabetes 1.5, 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   PAF (paroxysmal atrial fibrillation) (HCC) 08/14/2020   Dyslipidemia 07/01/2020   Type 2 diabetes mellitus with hyperglycemia (HCC) 07/01/2020   Body mass index (BMI) 40.0-44.9, adult (HCC) 11/07/2019   Elevated blood-pressure reading, without diagnosis of hypertension 11/07/2019   Spondylolisthesis, lumbar region 07/18/2018   Cervical spondylosis with myelopathy 01/13/2016   Cervical disc disorder with myelopathy 12/04/2015   Neck pain 12/04/2015    Orientation RESPIRATION BLADDER Height & Weight     Self, Time, Situation, Place  Normal Incontinent Weight: 181 lb 7 oz (82.3 kg) Height:  5' 6 (167.6 cm)  BEHAVIORAL SYMPTOMS/MOOD NEUROLOGICAL BOWEL NUTRITION STATUS      Incontinent Diet (Regular)  AMBULATORY STATUS COMMUNICATION OF NEEDS Skin   Extensive Assist Verbally Normal                       Personal Care Assistance Level of Assistance  Bathing, Feeding, Dressing Bathing Assistance: Limited assistance Feeding assistance: Independent Dressing Assistance: Limited assistance     Functional Limitations Info  Sight, Hearing, Speech Sight Info: Adequate Hearing Info: Adequate Speech Info: Adequate    SPECIAL CARE FACTORS FREQUENCY  PT (By licensed PT), OT (By licensed OT)     PT Frequency: 5 times weekly OT Frequency: 5 times weekly  Contractures Contractures Info: Not present    Additional Factors Info  Code Status, Allergies Code Status Info: FULL Allergies Info: elemental sulfu and metformin  and related           Current Medications (05/15/2024):  This is the current hospital active medication  list Current Facility-Administered Medications  Medication Dose Route Frequency Provider Last Rate Last Admin   0.9 %  sodium chloride  infusion   Intravenous Continuous Amin, Ankit C, MD 75 mL/hr at 05/15/24 1033 New Bag at 05/15/24 1033   acetaminophen  (TYLENOL ) tablet 650 mg  650 mg Oral Q6H PRN Arthea Child, MD       Or   acetaminophen  (TYLENOL ) suppository 650 mg  650 mg Rectal Q6H PRN Arthea Child, MD       apixaban  (ELIQUIS ) tablet 5 mg  5 mg Oral BID Claiborne, Claudia, MD   5 mg at 05/15/24 0839   atorvastatin  (LIPITOR) tablet 80 mg  80 mg Oral Daily Amin, Ankit C, MD   80 mg at 05/15/24 1031   busPIRone  (BUSPAR ) tablet 10 mg  10 mg Oral BID Amin, Ankit C, MD   10 mg at 05/15/24 1030   Chlorhexidine  Gluconate Cloth 2 % PADS 6 each  6 each Topical Q0600 Arthea Child, MD   6 each at 05/14/24 2205   cyclobenzaprine  (FLEXERIL ) tablet 10 mg  10 mg Oral Q12H Amin, Ankit C, MD   10 mg at 05/15/24 1030   ertapenem  (INVANZ ) 1 g in sodium chloride  0.9 % 100 mL IVPB  1 g Intravenous Q24H Arthea Child, MD 200 mL/hr at 05/15/24 1241 1 g at 05/15/24 1241   famotidine  (PEPCID ) tablet 20 mg  20 mg Oral QHS Claiborne, Claudia, MD   20 mg at 05/14/24 2232   feeding supplement (ENSURE PLUS HIGH PROTEIN) liquid 237 mL  237 mL Oral BID BM Claiborne, Claudia, MD   237 mL at 05/15/24 1030   gabapentin  (NEURONTIN ) capsule 300 mg  300 mg Oral TID Amin, Ankit C, MD   300 mg at 05/15/24 1031   guaiFENesin (ROBITUSSIN) 100 MG/5ML liquid 5 mL  5 mL Oral Q4H PRN Amin, Ankit C, MD       hydrALAZINE (APRESOLINE) injection 10 mg  10 mg Intravenous Q4H PRN Amin, Ankit C, MD       Influenza vac split trivalent PF (FLUZONE HIGH-DOSE) injection 0.5 mL  0.5 mL Intramuscular Tomorrow-1000 Arthea Child, MD       insulin  aspart (novoLOG ) injection 0-6 Units  0-6 Units Subcutaneous TID WC Claiborne, Claudia, MD   4 Units at 05/15/24 1151   insulin  aspart protamine- aspart (NOVOLOG  MIX 70/30)  injection 30 Units  30 Units Subcutaneous BID WC Amin, Ankit C, MD   30 Units at 05/15/24 1154   ipratropium-albuterol (DUONEB) 0.5-2.5 (3) MG/3ML nebulizer solution 3 mL  3 mL Nebulization Q4H PRN Amin, Ankit C, MD       meclizine  (ANTIVERT ) tablet 25 mg  25 mg Oral Daily PRN Amin, Ankit C, MD       ondansetron  (ZOFRAN ) tablet 4 mg  4 mg Oral Q6H PRN Arthea Child, MD       Or   ondansetron  (ZOFRAN ) injection 4 mg  4 mg Intravenous Q6H PRN Arthea Child, MD   4 mg at 05/14/24 2255   oxyCODONE -acetaminophen  (PERCOCET/ROXICET) 5-325 MG per tablet 1 tablet  1 tablet Oral Q8H PRN Arthea Child, MD   1 tablet at 05/15/24 0754   senna-docusate (Senokot-S) tablet 1 tablet  1 tablet Oral QHS PRN Amin, Ankit C, MD       sertraline  (ZOLOFT ) tablet 50 mg  50 mg Oral Daily Claiborne, Claudia, MD   50 mg at 05/15/24 0839   traZODone  (DESYREL ) tablet 100 mg  100 mg Oral QHS Claiborne, Claudia, MD   100 mg at 05/14/24 2231   vancomycin  (VANCOCIN ) capsule 125 mg  125 mg Oral QID Amin, Ankit C, MD   125 mg at 05/15/24 1031     Discharge Medications: Please see discharge summary for a list of discharge medications.  Relevant Imaging Results:  Relevant Lab Results:   Additional Information SSN: [id number], LCSWA

## 2024-05-15 NOTE — Evaluation (Signed)
 Occupational Therapy Evaluation Patient Details Name: Amy Livingston MRN: [id number] DOB: 1955/12/14 Today's Date: 05/15/2024   History of Present Illness   Amy Livingston is a 68 y.o. female with medical history significant diabetes mellitus T2 insulin  requiring also on Monjaro, paroxysmal atrial fibrillation on Eliquis , C. difficile colitis with completion of oral Vanco 7 months ago,  chronic urinary retention, recurrent UTIs with ESBL Klebsiella last month.  She completed 14 days of IV ertapenem  3 weeks ago.  Patient presents to the ED this evening with lower abdominal pain and 4 days of diarrhea.  She also reports intermittent fevers and chills over the last couple of days.  She has had a change in her bowel habits with bowel movements she describes as little balls and trouble with increased gas.  She is having excessive cramping prior to her diarrheal stools also.  No chest pain or shortness of breath.  No nausea or vomiting.  The patient has a long history of constipation prior to her C. Difficile.  Previously was on Linzess.  The last year she has lost greater than 130 pounds and she says food does not appeal to her at all.  She is currently on Mounjaro .  Her evaluation in the emergency department revealed a UTI.  And because of her recent ESBL Klebsiella she was started on Invanz .  She will be admitted for IV antibiotics.  The recurrent diarrhea over the last 4 days is concerning in this patient who has a history of C. difficile.  Her C. difficile antigen is positive but her toxin is negative. (per MD)     Clinical Impressions Pt agreeable to OT and PT co-evaluation. Pt reports independence for ADL's at baseline. Pt also ambulates with RW without additional assist at home. Today pt required CGA for bed mobility and min to mod A for sit to stand followed by pt being slightly incontinent while standing. Assisted for peri-care while standing with physical therapy with min to mod A using RW. B UE  generally weak. Pt reported 3 falls since Friday as well. Able to complete lower body dressing with set up assist today. Pt left in the bed with call bell within reach and bed alarm set. Pt will benefit from continued OT in the hospital to increase strength, balance, and endurance for safe ADL's.        If plan is discharge home, recommend the following:   A lot of help with walking and/or transfers;A little help with bathing/dressing/bathroom;Assistance with cooking/housework;Assist for transportation;Help with stairs or ramp for entrance     Functional Status Assessment   Patient has had a recent decline in their functional status and demonstrates the ability to make significant improvements in function in a reasonable and predictable amount of time.     Equipment Recommendations   None recommended by OT             Precautions/Restrictions   Precautions Precautions: Fall Recall of Precautions/Restrictions: Intact Restrictions Weight Bearing Restrictions Per Provider Order: No     Mobility Bed Mobility Overal bed mobility: Needs Assistance Bed Mobility: Supine to Sit, Sit to Supine     Supine to sit: HOB elevated, Used rails, Contact guard Sit to supine: HOB elevated, Used rails, Contact guard assist   General bed mobility comments: labored movement    Transfers Overall transfer level: Needs assistance Equipment used: Rolling walker (2 wheels) Transfers: Sit to/from Stand Sit to Stand: Mod assist, Min assist  General transfer comment: From EOB with RW      Balance Overall balance assessment: Needs assistance Sitting-balance support: Feet supported, No upper extremity supported Sitting balance-Leahy Scale: Fair Sitting balance - Comments: fair to good seated EOB   Standing balance support: Reliant on assistive device for balance, During functional activity, Bilateral upper extremity supported Standing balance-Leahy Scale: Poor Standing  balance comment: using RW                           ADL either performed or assessed with clinical judgement   ADL Overall ADL's : Needs assistance/impaired     Grooming: Set up;Sitting   Upper Body Bathing: Set up;Sitting   Lower Body Bathing: Set up;Sitting/lateral leans   Upper Body Dressing : Set up;Sitting   Lower Body Dressing: Set up;Sitting/lateral leans   Toilet Transfer: Minimal assistance;Moderate assistance;Stand-pivot;Rolling walker (2 wheels) Toilet Transfer Details (indicate cue type and reason): Partially simulated via sit to stand from EOB. Toileting- Clothing Manipulation and Hygiene: Maximal assistance;Sit to/from stand Toileting - Clothing Manipulation Details (indicate cue type and reason): Assisted for peri-care while standing with physical therapy today. Pt likely able to be more independent when not so incontinent.             Vision Baseline Vision/History: 1 Wears glasses;4 Cataracts Ability to See in Adequate Light: 2 Moderately impaired Patient Visual Report: No change from baseline Additional Comments: Baseline impairements.     Perception Perception: Not tested       Praxis Praxis: Not tested       Pertinent Vitals/Pain Pain Assessment Pain Assessment: 0-10 Pain Score: 5  Pain Location: stomach, privates; gluteal region Pain Descriptors / Indicators: Stabbing, Other (Comment) (Intense) Pain Intervention(s): Monitored during session, Repositioned     Extremity/Trunk Assessment Upper Extremity Assessment Upper Extremity Assessment: Generalized weakness   Lower Extremity Assessment Lower Extremity Assessment: Defer to PT evaluation   Cervical / Trunk Assessment Cervical / Trunk Assessment: Kyphotic   Communication Communication Communication: No apparent difficulties   Cognition Arousal: Alert Behavior During Therapy: WFL for tasks assessed/performed Cognition: No apparent impairments                                Following commands: Intact       Cueing  General Comments   Cueing Techniques: Verbal cues;Tactile cues;Visual cues                 Home Living Family/patient expects to be discharged to:: Private residence Living Arrangements: Children Available Help at Discharge: Family;Available 24 hours/day Type of Home: Apartment Home Access: Stairs to enter Entrance Stairs-Number of Steps: 3-4 Entrance Stairs-Rails: Right Home Layout: Two level;Able to live on main level with bedroom/bathroom Alternate Level Stairs-Number of Steps: 13 Alternate Level Stairs-Rails: Left Bathroom Shower/Tub: Tub/shower unit   Bathroom Toilet: Handicapped height Bathroom Accessibility: Yes How Accessible: Accessible via walker Home Equipment: Rolling Walker (2 wheels);Cane - single point;Toilet riser;Shower seat;Hospital bed;Wheelchair - Physiological scientist (4 wheels)   Additional Comments: Lives with daughter. Pt reports no change in home set up since last admission.      Prior Functioning/Environment Prior Level of Function : Needs assist;History of Falls (last six months) (3 falls since Friday prior to admission.)       Physical Assist : ADLs (physical) Mobility (physical): Bed mobility;Transfers;Gait;Stairs ADLs (physical): IADLs Mobility Comments: [place] household ambulator with RW. ADLs Comments: Assist IADL's;  independent ADL's.    OT Problem List: Decreased strength;Decreased activity tolerance;Impaired balance (sitting and/or standing)   OT Treatment/Interventions: Self-care/ADL training;Therapeutic exercise;Therapeutic activities;Patient/family education;Balance training      OT Goals(Current goals can be found in the care plan section)   Acute Rehab OT Goals Patient Stated Goal: Improve function. OT Goal Formulation: With patient Time For Goal Achievement: 05/29/24 Potential to Achieve Goals: Good   OT Frequency:  Min 2X/week    Co-evaluation PT/OT/SLP  Co-Evaluation/Treatment: Yes Reason for Co-Treatment: To address functional/ADL transfers;For patient/therapist safety PT goals addressed during session: Mobility/safety with mobility OT goals addressed during session: ADL's and self-care                       End of Session Equipment Utilized During Treatment: Rolling walker (2 wheels)  Activity Tolerance: Patient tolerated treatment well Patient left: in bed;with call bell/phone within reach; bed alarm set  OT Visit Diagnosis: Unsteadiness on feet (R26.81);Other abnormalities of gait and mobility (R26.89);Repeated falls (R29.6);Muscle weakness (generalized) (M62.81)                Time: 9069-9044 OT Time Calculation (min): 25 min Charges:  OT General Charges $OT Visit: 1 Visit OT Evaluation $OT Eval Low Complexity: 1 Low  Whit Bruni OT, MOT   Jayson Person 05/15/2024, 1:14 PM

## 2024-05-15 NOTE — Plan of Care (Signed)
  Problem: Acute Rehab OT Goals (only OT should resolve) Goal: Pt. Will Perform Grooming Flowsheets (Taken 05/15/2024 1318) Pt Will Perform Grooming:  with modified independence  standing Goal: Pt. Will Perform Lower Body Dressing Flowsheets (Taken 05/15/2024 1318) Pt Will Perform Lower Body Dressing: with modified independence Goal: Pt. Will Transfer To Toilet Flowsheets (Taken 05/15/2024 1318) Pt Will Transfer to Toilet:  with modified independence  ambulating Goal: Pt. Will Perform Toileting-Clothing Manipulation Flowsheets (Taken 05/15/2024 1318) Pt Will Perform Toileting - Clothing Manipulation and hygiene: with modified independence Goal: Pt/Caregiver Will Perform Home Exercise Program Flowsheets (Taken 05/15/2024 1318) Pt/caregiver will Perform Home Exercise Program:  Increased strength  Both right and left upper extremity  Independently  Linell Shawn OT, MOT

## 2024-05-15 NOTE — Progress Notes (Signed)
 PROGRESS NOTE    Amy Livingston  FMW:[id number] DOB: 1956-03-20 DOA: 05/14/2024 PCP: Edman Meade PEDLAR, FNP    Brief Narrative:  68 year old with history of DM2, paroxysmal A-fib on Eliquis , C. difficile colitis completed oral vancomycin  7 months ago, urinary retention with recurrent UTIs completed 14 days of IV ertapenem  3 days ago comes to the ED with lower abdominal pain and diarrhea including fevers and chills. Due to her prior history of C. difficile and current findings we have started on p.o. vancomycin .  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  Will follow urine culture data at this time.    Persistent diarrhea History of chronic constipation and C. Difficile -Given her history we will opt to start patient on p.o. vancomycin  while on antibiotic treatment (anticipate 5-7 more days post UTI Abx course, but will adjust based on clinical course).  Discussed with ID.  Okay to insert rectal tube due to persistent diarrhea  Hypokalemia/hypomagnesemia/hypocalcemia -Electrolyte repletion.  Check phosphorus levels  Diabetes mellitus type 2 with hyperglycemia, uncontrolled -Resume home 70/30 30 units twice daily.  Sliding scale Accu-Cheks  History of Paroxysmal atrial fibrillation - On Eliquis   Hyponatremia - Likely from volume depletion.  IV fluids  Hyperlipidemia - Statins   DVT prophylaxis: apixaban  (ELIQUIS ) tablet 5 mg     Code Status: Full Code Family Communication:   Status is: Inpatient Remains inpatient appropriate because: Continue hospital stay for at least next 3-4 days   PT Follow up Recs:   Subjective: Seen at bedside overall feels weak but slightly better compared to yesterday. Has been having persistent diarrhea for the last 3 days.   Examination:  General exam: Appears calm and comfortable  Respiratory system: Clear to auscultation. Respiratory effort  normal. Cardiovascular system: S1 & S2 heard, RRR. No JVD, murmurs, rubs, gallops or clicks. No pedal edema. Gastrointestinal system: Abdomen is nondistended, soft and nontender. No organomegaly or masses felt. Normal bowel sounds heard. Central nervous system: Alert and oriented. No focal neurological deficits. Extremities: Symmetric 5 x 5 power. Skin: No rashes, lesions or ulcers Psychiatry: Judgement and insight appear normal. Mood & affect appropriate.                Diet Orders (From admission, onward)     Start     Ordered   05/14/24 1853  Diet regular Room service appropriate? Yes; Fluid consistency: Thin  Diet effective now       Question Answer Comment  Room service appropriate? Yes   Fluid consistency: Thin      05/14/24 1852            Objective: Vitals:   05/15/24 0700 05/15/24 0715 05/15/24 0800 05/15/24 0830  BP: (!) 150/60  (!) 143/64 120/66  Pulse: 92  (!) 106 100  Resp: 16  20 18   Temp:  98.7 F (37.1 C)  98.7 F (37.1 C)  TempSrc:  Oral  Oral  SpO2: 100%  99% 97%  Weight:      Height:        Intake/Output Summary (Last 24 hours) at 05/15/2024 1147 Last data filed at 05/15/2024 0626 Gross per 24 hour  Intake --  Output 100 ml  Net -100 ml   Filed Weights   05/14/24 1136 05/14/24 2210  Weight: 83.9 kg 82.3 kg    Scheduled Meds:  apixaban   5 mg Oral BID   atorvastatin   80 mg Oral Daily   busPIRone   10 mg Oral BID   Chlorhexidine  Gluconate Cloth  6 each Topical Q0600   cyclobenzaprine   10 mg Oral Q12H   famotidine   20 mg Oral QHS   feeding supplement  237 mL Oral BID BM   gabapentin   300 mg Oral TID   Influenza vac split trivalent PF  0.5 mL Intramuscular Tomorrow-1000   insulin  aspart  0-6 Units Subcutaneous TID WC   insulin  aspart protamine- aspart  30 Units Subcutaneous BID WC   sertraline   50 mg Oral Daily   traZODone   100 mg Oral QHS   vancomycin   125 mg Oral QID   Continuous Infusions:  sodium chloride  75 mL/hr at  05/15/24 1033   calcium  gluconate     ertapenem      magnesium  sulfate bolus IVPB 4 g (05/15/24 1035)    Nutritional status     Body mass index is 29.28 kg/m.  Data Reviewed:   CBC: Recent Labs  Lab 05/14/24 1219 05/14/24 1226 05/15/24 0548  WBC 10.4  --  11.1*  HGB 8.7* 9.5* 7.6*  HCT 29.2* 28.0* 25.3*  MCV 80.4  --  80.1  PLT 292  --  269   Basic Metabolic Panel: Recent Labs  Lab 05/14/24 1219 05/14/24 1226 05/15/24 0548  NA 129* 132* 130*  K 3.4* 3.3* 3.5  CL 97* 97* 100  CO2 20*  --  20*  GLUCOSE 436* 448* 311*  BUN 15 13 13   CREATININE 1.20* 1.20* 1.13*  CALCIUM  8.7*  --  8.3*  MG  --   --  1.5*   GFR: Estimated Creatinine Clearance: 52.2 mL/min (A) (by C-G formula based on SCr of 1.13 mg/dL (H)). Liver Function Tests: Recent Labs  Lab 05/14/24 1219  AST 12*  ALT 6  ALKPHOS 139*  BILITOT 0.4  PROT 6.2*  ALBUMIN 2.7*   No results for input(s): LIPASE, AMYLASE in the last 168 hours. No results for input(s): AMMONIA in the last 168 hours. Coagulation Profile: No results for input(s): INR, PROTIME in the last 168 hours. Cardiac Enzymes: No results for input(s): CKTOTAL, CKMB, CKMBINDEX, TROPONINI in the last 168 hours. BNP (last 3 results) No results for input(s): PROBNP in the last 8760 hours. HbA1C: No results for input(s): HGBA1C in the last 72 hours. CBG: Recent Labs  Lab 05/14/24 1524 05/14/24 1930 05/14/24 2149 05/15/24 0717 05/15/24 1125  GLUCAP 354* 182* 197* 323* 320*   Lipid Profile: No results for input(s): CHOL, HDL, LDLCALC, TRIG, CHOLHDL, LDLDIRECT in the last 72 hours. Thyroid  Function Tests: No results for input(s): TSH, T4TOTAL, FREET4, T3FREE, THYROIDAB in the last 72 hours. Anemia Panel: No results for input(s): VITAMINB12, FOLATE, FERRITIN, TIBC, IRON, RETICCTPCT in the last 72 hours. Sepsis Labs: No results for input(s): PROCALCITON, LATICACIDVEN in the  last 168 hours.  Recent Results (from the past 240 hours)  C Difficile Quick Screen w PCR reflex     Status: Abnormal   Collection Time: 05/14/24  3:05 PM   Specimen: Urine, Catheterized; Stool  Result Value Ref Range Status   C Diff antigen POSITIVE (A) NEGATIVE Final   C Diff toxin NEGATIVE NEGATIVE Final   C Diff interpretation Results are indeterminate. See PCR results.  Final    Comment: Performed at [facility], [address], [place], [place] [address]  C. Diff by PCR, Reflexed     Status: Abnormal   Collection Time: 05/14/24  3:05 PM  Result Value Ref  Range Status   Toxigenic C. Difficile by PCR POSITIVE (A) NEGATIVE Final    Comment: Positive for toxigenic C. difficile with little to no toxin production. Only treat if clinical presentation suggests symptomatic illness.   Hypervirulent Strain PRESUMPTIVE NEGATIVE PRESUMPTIVE NEGATIVE Final    Comment: Performed at [facility], [address], [place], [place] [address]  MRSA Next Gen by PCR, Nasal     Status: None   Collection Time: 05/14/24  9:33 PM   Specimen: Nasal Mucosa; Nasal Swab  Result Value Ref Range Status   MRSA by PCR Next Gen NOT DETECTED NOT DETECTED Final    Comment: (NOTE) The GeneXpert MRSA Assay (FDA approved for NASAL specimens only), is one component of a comprehensive MRSA colonization surveillance program. It is not intended to diagnose MRSA infection nor to guide or monitor treatment for MRSA infections. Test performance is not FDA approved in patients less than 53 years old. Performed at [facility], [address] [place], [place] [address]          Radiology Studies: No results found.         LOS: 1 day   Time spent= 35 mins    [place]ess JAYSON Dare, MD [facility]  If 7PM-7AM, please contact night-coverage  05/15/2024, 11:47 AM

## 2024-05-15 NOTE — Evaluation (Signed)
 Physical Therapy Evaluation Patient Details Name: Tristina Sahagian MRN: [id number] DOB: 1955/12/15 Today's Date: 05/15/2024  History of Present Illness  Kirby Argueta is a 68 y.o. female with medical history significant diabetes mellitus T2 insulin  requiring also on Monjaro, paroxysmal atrial fibrillation on Eliquis , C. difficile colitis with completion of oral Vanco 7 months ago,  chronic urinary retention, recurrent UTIs with ESBL Klebsiella last month.  She completed 14 days of IV ertapenem  3 weeks ago.  Patient presents to the ED this evening with lower abdominal pain and 4 days of diarrhea.  She also reports intermittent fevers and chills over the last couple of days.  She has had a change in her bowel habits with bowel movements she describes as little balls and trouble with increased gas.  She is having excessive cramping prior to her diarrheal stools also.  No chest pain or shortness of breath.  No nausea or vomiting.  The patient has a long history of constipation prior to her C. Difficile.  Previously was on Linzess.  The last year she has lost greater than 130 pounds and she says food does not appeal to her at all.  She is currently on Mounjaro .  Her evaluation in the emergency department revealed a UTI.  And because of her recent ESBL Klebsiella she was started on Invanz .  She will be admitted for IV antibiotics.  The recurrent diarrhea over the last 4 days is concerning in this patient who has a history of C. difficile.  Her C. difficile antigen is positive but her toxin is negative.   Clinical Impression  Patient agreeable to OT/PT co-evaluation. Patient reports at baseline, she is a household Ambulator with AD, independent with ADLs, and requires assist with iADLs from daughter whom she lives with. On this date, patient requires min/CGA for bed mobility, and moderate assist for STS from bed with AD. Session is limited as pt experiences incontinence once standing. Dependent for pericare in  standing position. Pt requests to sit due to LE fatigue/endurance deficit. Patient is able to donn socks independently in sitting. Min/CGA assist for return to supine. Pt generally weak throughout with LE MMT at 3+/5 at best. Patient left in bed with call button within reach and bed alarm set. Nursing notified. Patient will benefit from continued skilled physical therapy acutely and in recommended venue in order to address current deficits to improve independence, decrease fall risk, and improve quality of life.        If plan is discharge home, recommend the following: A lot of help with walking and/or transfers;A lot of help with bathing/dressing/bathroom;Help with stairs or ramp for entrance;Assistance with cooking/housework   Can travel by private vehicle        Equipment Recommendations None recommended by PT  Recommendations for Other Services       Functional Status Assessment Patient has had a recent decline in their functional status and demonstrates the ability to make significant improvements in function in a reasonable and predictable amount of time.     Precautions / Restrictions Precautions Precautions: Fall Recall of Precautions/Restrictions: Intact Restrictions Weight Bearing Restrictions Per Provider Order: No      Mobility  Bed Mobility Overal bed mobility: Needs Assistance Bed Mobility: Supine to Sit, Sit to Supine     Supine to sit: HOB elevated, Used rails, Contact guard, Min assist Sit to supine: HOB elevated, Used rails, Contact guard assist, Min assist   General bed mobility comments: inc time due to slow/labored movement, use of  R railing with HOB elevated, CGA for safety once seated EOB    Transfers Overall transfer level: Needs assistance Equipment used: Rolling walker (2 wheels) Transfers: Sit to/from Stand Sit to Stand: Mod assist, Min assist   General transfer comment: min/mod assist w/ RW due to LE weakness    Ambulation/Gait      General Gait Details: Not assessed this date, as session is limited due to incontinence once pt stood at bedside  Stairs            Wheelchair Mobility     Tilt Bed    Modified Rankin (Stroke Patients Only)       Balance Overall balance assessment: Needs assistance Sitting-balance support: Feet supported, No upper extremity supported Sitting balance-Leahy Scale: Fair Sitting balance - Comments: Seated EOB   Standing balance support: Reliant on assistive device for balance, During functional activity, Bilateral upper extremity supported Standing balance-Leahy Scale: Poor Standing balance comment: w/ RW         Pertinent Vitals/Pain Pain Assessment Pain Assessment: Faces Faces Pain Scale: Hurts even more Pain Location: All over Pain Descriptors / Indicators: Grimacing Pain Intervention(s): Limited activity within patient's tolerance, Repositioned    Home Living Family/patient expects to be discharged to:: Private residence Living Arrangements: Children Available Help at Discharge: Family;Available 24 hours/day Type of Home: Apartment Home Access: Stairs to enter Entrance Stairs-Rails: Right Entrance Stairs-Number of Steps: 3-4 Alternate Level Stairs-Number of Steps: 13 Home Layout: Two level;Able to live on main level with bedroom/bathroom Home Equipment: Rolling Walker (2 wheels);Cane - single point;Toilet riser;Shower seat;Hospital bed;Wheelchair - manual;Other (comment) Aeronautical engineer) Additional Comments: Lives with daughter. Pt reports no change in home set up since last admission.    Prior Function Prior Level of Function : Needs assist;History of Falls (last six months)       Physical Assist : ADLs (physical);Mobility (physical) Mobility (physical): Bed mobility;Transfers;Gait;Stairs ADLs (physical): IADLs Mobility Comments: reports as household ambulator mostly with AD, reports community ambualtion as limited recently. No longer drives due to cataracts.  Reports 3 falls in last week ADLs Comments: Assist IADL's; independent ADL's.     Extremity/Trunk Assessment   Upper Extremity Assessment Upper Extremity Assessment: Defer to OT evaluation    Lower Extremity Assessment Lower Extremity Assessment: Generalized weakness (Pt generally weak throughout. hip flexion MMT 3+/5 at best)    Cervical / Trunk Assessment Cervical / Trunk Assessment: Kyphotic  Communication   Communication Communication: No apparent difficulties    Cognition Arousal: Alert Behavior During Therapy: WFL for tasks assessed/performed   PT - Cognitive impairments: No apparent impairments       Following commands: Intact       Cueing Cueing Techniques: Verbal cues, Tactile cues, Visual cues     General Comments      Exercises     Assessment/Plan    PT Assessment Patient needs continued PT services;All further PT needs can be met in the next venue of care  PT Problem List Decreased strength;Decreased activity tolerance;Decreased balance;Decreased mobility;Pain       PT Treatment Interventions Gait training;DME instruction;Stair training;Functional mobility training;Therapeutic activities;Therapeutic exercise;Balance training;Patient/family education    PT Goals (Current goals can be found in the Care Plan section)  Acute Rehab PT Goals Patient Stated Goal: Return home PT Goal Formulation: With patient Time For Goal Achievement: 05/29/24 Potential to Achieve Goals: Good    Frequency Min 3X/week     Co-evaluation PT/OT/SLP Co-Evaluation/Treatment: Yes Reason for Co-Treatment: To address functional/ADL transfers;For patient/therapist safety PT goals  addressed during session: Mobility/safety with mobility         AM-PAC PT 6 Clicks Mobility  Outcome Measure Help needed turning from your back to your side while in a flat bed without using bedrails?: A Little Help needed moving from lying on your back to sitting on the side of a flat bed  without using bedrails?: A Little Help needed moving to and from a bed to a chair (including a wheelchair)?: A Lot Help needed standing up from a chair using your arms (e.g., wheelchair or bedside chair)?: A Lot Help needed to walk in hospital room?: A Lot Help needed climbing 3-5 steps with a railing? : A Lot 6 Click Score: 14    End of Session Equipment Utilized During Treatment: Gait belt Activity Tolerance: Patient limited by pain;Patient limited by fatigue Patient left: in bed;with call bell/phone within reach;with bed alarm set Nurse Communication: Mobility status;Other (comment) (Need for bed change) PT Visit Diagnosis: Unsteadiness on feet (R26.81);Muscle weakness (generalized) (M62.81);Repeated falls (R29.6);Other abnormalities of gait and mobility (R26.89);Difficulty in walking, not elsewhere classified (R26.2)    Time: 9069-9044 PT Time Calculation (min) (ACUTE ONLY): 25 min   Charges:   PT Evaluation $PT Eval Moderate Complexity: 1 Mod   PT General Charges $$ ACUTE PT VISIT: 1 Visit        12:11 PM, 05/15/24 Dolorez Jeffrey Powell-Butler, PT, DPT [place] with [facility]

## 2024-05-16 DIAGNOSIS — N3000 Acute cystitis without hematuria: Secondary | ICD-10-CM | POA: Diagnosis not present

## 2024-05-16 LAB — URINE CULTURE: Culture: >=100000 — AB

## 2024-05-16 LAB — BASIC METABOLIC PANEL WITH GFR
Anion gap: 10 (ref 5–15)
BUN: 15 mg/dL (ref 8–23)
CO2: 20 mmol/L — ABNORMAL LOW (ref 22–32)
Calcium: 9 mg/dL (ref 8.9–10.3)
Chloride: 106 mmol/L (ref 98–111)
Creatinine, Ser: 1.13 mg/dL — ABNORMAL HIGH (ref 0.44–1.00)
GFR, Estimated: 53 mL/min — ABNORMAL LOW (ref >60)
Glucose, Bld: 77 mg/dL (ref 70–99)
Potassium: 3.6 mmol/L (ref 3.5–5.1)
Sodium: 136 mmol/L (ref 135–145)

## 2024-05-16 LAB — CBC
HCT: 24.9 % — ABNORMAL LOW (ref 36.0–46.0)
Hemoglobin: 7.6 g/dL — ABNORMAL LOW (ref 12.0–15.0)
MCH: 24.5 pg — ABNORMAL LOW (ref 26.0–34.0)
MCHC: 30.5 g/dL (ref 30.0–36.0)
MCV: 80.3 fL (ref 80.0–100.0)
Platelets: 289 K/uL (ref 150–400)
RBC: 3.1 MIL/uL — ABNORMAL LOW (ref 3.87–5.11)
RDW: 18.1 % — ABNORMAL HIGH (ref 11.5–15.5)
WBC: 11.6 K/uL — ABNORMAL HIGH (ref 4.0–10.5)
nRBC: 0 % (ref 0.0–0.2)

## 2024-05-16 LAB — PHOSPHORUS: Phosphorus: 2.2 mg/dL — ABNORMAL LOW (ref 2.5–4.6)

## 2024-05-16 LAB — GLUCOSE, CAPILLARY
Glucose-Capillary: 77 mg/dL (ref 70–99)
Glucose-Capillary: 179 mg/dL — ABNORMAL HIGH (ref 70–99)
Glucose-Capillary: 133 mg/dL — ABNORMAL HIGH (ref 70–99)
Glucose-Capillary: 124 mg/dL — ABNORMAL HIGH (ref 70–99)

## 2024-05-16 LAB — MAGNESIUM: Magnesium: 2.4 mg/dL (ref 1.7–2.4)

## 2024-05-16 MED ORDER — SODIUM CHLORIDE 0.9 % IV SOLN
1.0000 g | INTRAVENOUS | Status: DC
  Administered 2024-05-16 – 2024-05-17 (×2): 1 g via INTRAVENOUS
  Filled 2024-05-16 (×2): qty 10

## 2024-05-16 MED ORDER — K PHOS MONO-SOD PHOS DI & MONO 155-852-130 MG PO TABS
500.0000 mg | ORAL_TABLET | Freq: Two times a day (BID) | ORAL | Status: DC
  Administered 2024-05-16 – 2024-05-17 (×3): 500 mg via ORAL
  Filled 2024-05-16 (×3): qty 2

## 2024-05-16 MED ORDER — SODIUM CHLORIDE 0.9 % IV SOLN: INTRAVENOUS | Status: AC

## 2024-05-16 MED ORDER — ADULT MULTIVITAMIN W/MINERALS CH
1.0000 | ORAL_TABLET | Freq: Every day | ORAL | Status: DC
  Administered 2024-05-16 – 2024-05-17 (×2): 1 via ORAL
  Filled 2024-05-16 (×2): qty 1

## 2024-05-16 MED ORDER — SACCHAROMYCES BOULARDII 250 MG PO CAPS
250.0000 mg | ORAL_CAPSULE | Freq: Two times a day (BID) | ORAL | Status: DC
  Administered 2024-05-16 – 2024-05-17 (×3): 250 mg via ORAL
  Filled 2024-05-16 (×3): qty 1

## 2024-05-16 NOTE — Inpatient Diabetes Management (Signed)
 Inpatient Diabetes Program Recommendations  AACE/ADA: New Consensus Statement on Inpatient Glycemic Control (2015)  Target Ranges:  Prepandial:   less than 140 mg/dL      Peak postprandial:   less than 180 mg/dL (1-2 hours)      Critically ill patients:  140 - 180 mg/dL   Lab Results  Component Value Date   GLUCAP 77 05/16/2024   HGBA1C 8.9 (H) 03/11/2024    Review of Glycemic Control  Latest Reference Range & Units 05/15/24 07:17 05/15/24 11:25 05/15/24 16:16 05/15/24 21:07 05/16/24 07:35  Glucose-Capillary 70 - 99 mg/dL 676 (H) 679 (H) 796 (H) 118 (H) 77   Diabetes history: DM 2 Outpatient Diabetes medications: 70/30 30 BID, Mounjaro  15 mg Qweek  Current orders for Inpatient glycemic control:  70/30 30 units bid Novolog  0-6 units tid  Ensure HP + (19 grams of carbohydrates) bid between meals  Inpatient Diabetes Program Recommendations:    Note: fasting glucose 77 this am.  -   consider decreasing 70/30 dose to 25 units bid  Thanks,  Clotilda Bull RN, MSN, BC-ADM Inpatient Diabetes Coordinator Team Pager [phone] (8a-5p)

## 2024-05-16 NOTE — Progress Notes (Signed)
 PROGRESS NOTE  Amy Livingston FMW:[id number] DOB: 1956-06-14 DOA: 05/14/2024 PCP: Edman Meade PEDLAR, FNP   LOS: 2 days   Brief narrative:  69 year old with history of DM2, paroxysmal A-fib on Eliquis , C. difficile colitis completed oral vancomycin  7 months ago, urinary retention with recurrent UTIs completed 14 days of IV ertapenem  3 days ago presented to hospital again with abdominal pain diarrhea fever and chills.  Patient was then admitted to hospital for further evaluation and treatment.   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  Will be changed to Rocephin  from today.  Could be transition to p.o. Keflex  Duricef or Septra DS on discharge.  Urine culture showing Klebsiella pneumonia sensitivity pending.  No ESBL noted at this time.  Persistent diarrhea History of chronic constipation and C. Difficile Patient was empirically started on p.o. vancomycin  while on antibiotic treatment.  GI pathogen panel negative.  Previous provider had discussed with ID.  Will need oral vancomycin  for 5 to 7 days post treatment for UTI.  Rectal tube due to persistent diarrhea.  Will continue to monitor closely and support.  On nutritional supplements.  Hypokalemia Improved after replacement.  Latest potassium of 3.6.  Check levels in AM.  hypomagnesemia/hypocalcemia Improved after replacement.  Latest magnesium  of 2.4 and calcium  of 9.0.  Hypophosphatemia.  Phosphorus level of 2.2 today.  Will replace with K-Phos.  Check levels in AM.  Hyponatremia From volume depletion.  Received IV fluids.  Latest sodium of 136 and has  improved.  Diabetes mellitus type 2 with hyperglycemia, uncontrolled Continue home insulin  70/30, 30 units twice daily, sliding scale insulin .  Accu-Cheks.  Latest POC glucose of 77.  Diabetic coordinator on board.  History of Paroxysmal atrial fibrillation Continue Eliquis .  Slightly tachycardic.  Will continue to monitor closely.  Hyperlipidemia Continue statins  Debility deconditioning.  Physical therapy has recommended skilled nursing facility at this time.  TOC on board.  DVT prophylaxis:  apixaban  (ELIQUIS ) tablet 5 mg   Disposition: PT OT rehab has recommended skilled nursing facility placement.  Status is: Inpatient Remains inpatient appropriate because: Pending clinical improvement    Code Status:     Code Status: Full Code  Family Communication: None at bedside  Consultants: ID -verbal consult  Procedures:  rectal tube placement  Anti-infectives:  Rocephin  IV Oral vancomycin   Anti-infectives (From admission, onward)    Start     Dose/Rate Route Frequency Ordered Stop   05/16/24 1200  cefTRIAXone  (ROCEPHIN ) 1 g in sodium chloride  0.9 % 100 mL IVPB        1 g 200 mL/hr over 30 Minutes Intravenous Every 24 hours 05/16/24 1023     05/15/24 1015  vancomycin  (VANCOCIN ) capsule 125 mg        125 mg Oral 4 times daily 05/15/24 0917 05/25/24 0959   05/15/24 1000  ertapenem  (INVANZ ) 1 g in sodium chloride  0.9 % 100 mL IVPB  Status:  Discontinued        1 g 200 mL/hr over 30 Minutes Intravenous Every 24 hours 05/15/24 0100 05/16/24 1023   05/14/24 1700  ertapenem  (INVANZ ) 1 g in sodium chloride  0.9 % 100 mL  IVPB        1 g 200 mL/hr over 30 Minutes Intravenous  Once 05/14/24 1653 05/14/24 1802        Subjective: Today, patient was seen and examined at bedside.  Patient states that she has no nausea or vomiting or abdominal pain.  Still has some loose stools.  Denies any urinary urgency frequency or dysuria.  Planes of fatigue and  weakness.  Objective: Vitals:   05/16/24 0054 05/16/24 0405  BP: (!) 122/48 (!) 128/55  Pulse: (!) 109 (!) 110  Resp: 17 16  Temp: 98.7 F (37.1 C) 98.5 F (36.9 C)  SpO2: 94% 94%    Intake/Output Summary (Last 24 hours) at 05/16/2024 1203 Last data filed at 05/16/2024 1129 Gross per 24 hour  Intake 1793.54 ml  Output 2000 ml  Net -206.46 ml   Filed Weights   05/14/24 1136 05/14/24 2210  Weight: 83.9 kg 82.3 kg   Body mass index is 29.28 kg/m.   Physical Exam:  GENERAL: Patient is alert awake and oriented. Not in obvious distress. HENT: Mild pallor noted pupils equally reactive to light. Oral mucosa is moist NECK: is supple, no gross swelling noted. CHEST: Clear to auscultation.  Diminished breath sounds bilaterally. CVS: S1 and S2 heard, no murmur. Regular rate and rhythm.  ABDOMEN: Soft, non-tender, bowel sounds are present.  Rectal tube in place with loose stools. EXTREMITIES: No edema. CNS: Cranial nerves are intact. No focal motor deficits. SKIN: warm and dry without rashes.  Data Review: I have personally reviewed the following laboratory data and studies,  CBC: Recent Labs  Lab 05/14/24 1219 05/14/24 1226 05/15/24 0548 05/16/24 0620  WBC 10.4  --  11.1* 11.6*  HGB 8.7* 9.5* 7.6* 7.6*  HCT 29.2* 28.0* 25.3* 24.9*  MCV 80.4  --  80.1 80.3  PLT 292  --  269 289   Basic Metabolic Panel: Recent Labs  Lab 05/14/24 1219 05/14/24 1226 05/15/24 0548 05/16/24 0620  NA 129* 132* 130* 136  K 3.4* 3.3* 3.5 3.6  CL 97* 97* 100 106  CO2 20*  --  20* 20*  GLUCOSE 436* 448* 311* 77  BUN 15 13 13 15   CREATININE 1.20* 1.20* 1.13* 1.13*  CALCIUM  8.7*  --  8.3* 9.0  MG  --   --  1.5* 2.4  PHOS  --   --   --  2.2*   Liver Function Tests: Recent Labs  Lab 05/14/24 1219  AST 12*  ALT 6  ALKPHOS 139*  BILITOT 0.4  PROT 6.2*  ALBUMIN 2.7*   No results for input(s): LIPASE, AMYLASE in the last 168 hours. No results for input(s): AMMONIA in the last  168 hours. Cardiac Enzymes: No results for input(s): CKTOTAL, CKMB, CKMBINDEX, TROPONINI in the last 168 hours. BNP (last 3 results) Recent Labs    10/08/23 1929  BNP 39.0    ProBNP (last 3 results) No results for input(s): PROBNP in the last 8760 hours.  CBG: Recent Labs  Lab 05/15/24 1125 05/15/24 1616 05/15/24 2107 05/16/24 0735 05/16/24 1111  GLUCAP 320* 203* 118* 77 179*   Recent Results (from the past 240 hours)  C Difficile Quick Screen w PCR reflex     Status: Abnormal   Collection Time: 05/14/24  3:05 PM   Specimen: Urine, Catheterized; Stool  Result Value Ref Range Status   C Diff antigen POSITIVE (A) NEGATIVE Final   C Diff toxin NEGATIVE NEGATIVE Final   C Diff interpretation Results  are indeterminate. See PCR results.  Final    Comment: Performed at [facility], [address] [place], [place] [address]  Gastrointestinal Panel by PCR , Stool     Status: None   Collection Time: 05/14/24  3:05 PM   Specimen: Urine, Catheterized; Stool  Result Value Ref Range Status   Campylobacter species NOT DETECTED NOT DETECTED Final   Plesimonas shigelloides NOT DETECTED NOT DETECTED Final   Salmonella species NOT DETECTED NOT DETECTED Final   Yersinia enterocolitica NOT DETECTED NOT DETECTED Final   Vibrio species NOT DETECTED NOT DETECTED Final   Vibrio cholerae NOT DETECTED NOT DETECTED Final   Enteroaggregative E coli (EAEC) NOT DETECTED NOT DETECTED Final   Enteropathogenic E coli (EPEC) NOT DETECTED NOT DETECTED Final   Enterotoxigenic E coli (ETEC) NOT DETECTED NOT DETECTED Final   Shiga like toxin producing E coli (STEC) NOT DETECTED NOT DETECTED Final   Shigella/Enteroinvasive E coli (EIEC) NOT DETECTED NOT DETECTED Final   Cryptosporidium NOT DETECTED NOT DETECTED Final   Cyclospora cayetanensis NOT DETECTED NOT DETECTED Final   Entamoeba histolytica NOT DETECTED NOT DETECTED Final   Giardia lamblia NOT DETECTED NOT DETECTED Final   Adenovirus  F40/41 NOT DETECTED NOT DETECTED Final   Astrovirus NOT DETECTED NOT DETECTED Final   Norovirus GI/GII NOT DETECTED NOT DETECTED Final   Rotavirus A NOT DETECTED NOT DETECTED Final   Sapovirus (I, II, IV, and V) NOT DETECTED NOT DETECTED Final    Comment: Performed at [facility], [address] [place], [place] [address]  C. Diff by PCR, Reflexed     Status: Abnormal   Collection Time: 05/14/24  3:05 PM  Result Value Ref Range Status   Toxigenic C. Difficile by PCR POSITIVE (A) NEGATIVE Final    Comment: Positive for toxigenic C. difficile with little to no toxin production. Only treat if clinical presentation suggests symptomatic illness.   Hypervirulent Strain PRESUMPTIVE NEGATIVE PRESUMPTIVE NEGATIVE Final    Comment: Performed at [facility], [address], [place], [place] [address]  Urine Culture     Status: Abnormal   Collection Time: 05/14/24  3:05 PM   Specimen: Urine, Clean Catch  Result Value Ref Range Status   Specimen Description   Final    URINE, CLEAN CATCH Performed at [facility], [address], [place], [place] [address]    Special Requests   Final    NONE Performed at [facility], [address], [place], [place] [address]    Culture >=100,000 COLONIES/mL KLEBSIELLA PNEUMONIAE (A)  Final   Report Status 05/16/2024 FINAL  Final   Organism ID, Bacteria KLEBSIELLA PNEUMONIAE (A)  Final      Susceptibility   Klebsiella pneumoniae - MIC*    AMPICILLIN RESISTANT Resistant     CEFAZOLIN  (URINE) Value in next row Sensitive      2 SENSITIVEThis is a modified FDA-approved test that has been validated and its performance characteristics determined by the reporting laboratory.  This laboratory is certified under the Clinical Laboratory Improvement Amendments CLIA as qualified to perform high complexity clinical laboratory testing.    CEFEPIME  Value in next row Sensitive      2 SENSITIVEThis is a modified FDA-approved test that has been validated and its  performance characteristics determined by the reporting laboratory.  This laboratory is certified under the Clinical Laboratory Improvement Amendments CLIA as qualified to perform high complexity clinical laboratory testing.    ERTAPENEM  Value in next row Sensitive  2 SENSITIVEThis is a modified FDA-approved test that has been validated and its performance characteristics determined by the reporting laboratory.  This laboratory is certified under the Clinical Laboratory Improvement Amendments CLIA as qualified to perform high complexity clinical laboratory testing.    CEFTRIAXONE  Value in next row Sensitive      2 SENSITIVEThis is a modified FDA-approved test that has been validated and its performance characteristics determined by the reporting laboratory.  This laboratory is certified under the Clinical Laboratory Improvement Amendments CLIA as qualified to perform high complexity clinical laboratory testing.    CIPROFLOXACIN Value in next row Sensitive      2 SENSITIVEThis is a modified FDA-approved test that has been validated and its performance characteristics determined by the reporting laboratory.  This laboratory is certified under the Clinical Laboratory Improvement Amendments CLIA as qualified to perform high complexity clinical laboratory testing.    GENTAMICIN Value in next row Sensitive      2 SENSITIVEThis is a modified FDA-approved test that has been validated and its performance characteristics determined by the reporting laboratory.  This laboratory is certified under the Clinical Laboratory Improvement Amendments CLIA as qualified to perform high complexity clinical laboratory testing.    NITROFURANTOIN  Value in next row Resistant      2 SENSITIVEThis is a modified FDA-approved test that has been validated and its performance characteristics determined by the reporting laboratory.  This laboratory is certified under the Clinical Laboratory Improvement Amendments CLIA as qualified to  perform high complexity clinical laboratory testing.    TRIMETH/SULFA Value in next row Sensitive      2 SENSITIVEThis is a modified FDA-approved test that has been validated and its performance characteristics determined by the reporting laboratory.  This laboratory is certified under the Clinical Laboratory Improvement Amendments CLIA as qualified to perform high complexity clinical laboratory testing.    AMPICILLIN/SULBACTAM Value in next row Sensitive      2 SENSITIVEThis is a modified FDA-approved test that has been validated and its performance characteristics determined by the reporting laboratory.  This laboratory is certified under the Clinical Laboratory Improvement Amendments CLIA as qualified to perform high complexity clinical laboratory testing.    PIP/TAZO Value in next row Sensitive      <=4 SENSITIVEThis is a modified FDA-approved test that has been validated and its performance characteristics determined by the reporting laboratory.  This laboratory is certified under the Clinical Laboratory Improvement Amendments CLIA as qualified to perform high complexity clinical laboratory testing.    MEROPENEM  Value in next row Sensitive      <=4 SENSITIVEThis is a modified FDA-approved test that has been validated and its performance characteristics determined by the reporting laboratory.  This laboratory is certified under the Clinical Laboratory Improvement Amendments CLIA as qualified to perform high complexity clinical laboratory testing.    * >=100,000 COLONIES/mL KLEBSIELLA PNEUMONIAE  MRSA Next Gen by PCR, Nasal     Status: None   Collection Time: 05/14/24  9:33 PM   Specimen: Nasal Mucosa; Nasal Swab  Result Value Ref Range Status   MRSA by PCR Next Gen NOT DETECTED NOT DETECTED Final    Comment: (NOTE) The GeneXpert MRSA Assay (FDA approved for NASAL specimens only), is one component of a comprehensive MRSA colonization surveillance program. It is not intended to diagnose MRSA  infection nor to guide or monitor treatment for MRSA infections. Test performance is not FDA approved in patients less than 8 years old. Performed at [facility], [address]  [place], [place] [address]      Studies: No results found.    Valerye Kobus, MD  [facility] 05/16/2024  If 7PM-7AM, please contact night-coverage

## 2024-05-16 NOTE — Progress Notes (Signed)
 The beneficiary is confined to a single room and will need a bedside commode to safely return home at time of hospital discharge.

## 2024-05-16 NOTE — Discharge Instructions (Signed)
 Nutrition Tips for Managing Obesity Medications  Obesity medications can help you manage your weight by decreasing hunger and food cravings and increasing fullness. The goal is to eat enough nutritious foods for health and well-being while achieving healthy weight loss. It is important to eat nutrient-dense foods at meals and snacks especially since you may not be able to tolerate large portions. Protein foods are important to help maintain your muscle mass. Obesity medications may result in food staying in your stomach for longer (which is also called delayed gastric emptying). You may experience side effects such as nausea, vomiting, diarrhea, or constipation. There are several nutrition tips that can help you manage these side effects.  Tips Meal Planning Tips You may not always feel hunger, but it is important to eat. Set scheduled times to eat meals and do not skip meals. Eat slowly and stop eating when you feel full.  Choose a variety of nutrient-dense foods including vegetables, fruits, whole grains, lean protein foods, low-fat dairy or dairy alternatives, and healthy fats. Eat protein foods with each meal and snacks. Eat these protein foods first if you feel full quickly when eating.  Protein foods include lean meat, poultry, seafood, beans, peas, lentils, nuts, seeds, low-fat dairy foods, eggs, and soy products. Your registered dietitian nutritionist (RDN) may recommend a protein supplement. Limit your intake of high-fat foods since these foods take longer to digest.  Drink at least 48-64 ounces (6 to 8 cups) of low-calorie, caffeine-free beverages daily. Choose water or caffeine-free and unsweetened coffee/tea Limit sugar-sweetened beverages, alcohol, and caffeinated beverages. Limit carbonated beverages if they cause bloating. Your RDN may recommend a vitamin/mineral supplement if your diet does not provide enough nutrients. You will still need to follow a healthy, nutrient-dense diet if  you wean off your medication or if you are in the weight maintenance stage. Your RDN can provide you with additional handouts to help you maintain your weight loss and healthy habits.  Tips for Managing Side Effects Related to Obesity Medication Side Effect Nutrition Tips  Nausea/Vomiting Eat small, frequent meals Follow a low-fat diet Eat slowly Stop eating when you feel full Avoid strong smells Drink ginger tea or suck on a lozenge Keep a food diary to monitor your diet and identify trigger foods  Constipation Increase your intake of fiber-containing foods such as fruits, vegetables, and whole grains Drink 48-64 ounces (6 to 8 cups) of fluid daily Increase your physical activity Your RDN may recommend a fiber supplement or stool softener if needed  Diarrhea Drink 48-64 ounces (6 to 8 cups) of fluid daily Limit intake of coffee, dairy, alcohol, and soda Avoid food and drinks that contain sugar alcohols Increase your intake of fiber-containing foods such as fruits, vegetables, and whole grains.   Keep a food diary to monitor your diet and identify trigger foods    Copyright 2025  Academy of [employer] and [employer]etics. All rights reserved.

## 2024-05-16 NOTE — TOC Progression Note (Signed)
 Transition of Care [facility]) - Progression Note    Patient Details  Name: Amy Livingston MRN: [id number] Date of Birth: 05-30-1956  Transition of Care [facility] CM/SW Contact  Lucie Lunger, [place] Phone Number: 05/16/2024, 1:12 PM  Clinical Narrative:    CSW met with pt at bedside to review bed offers. Pt states she has thought about it and would like to return home with [facility] services. CSW inquired if pt felt comfortable returning home once medically stable. Pt states she feels she can manage safely in the home. Pt states she has used [facility] in the past and is agreeable to them again, CSW to make referral. Pt states she has a walker and would like CSW to order a [facility] for her if possible. CSW to request [facility] D[facility] orders and referral will be sent to Zach with [employer]. TOC to follow.   Expected Discharge Plan: Skilled Nursing Facility Barriers to Discharge: Continued Medical Work up               Expected Discharge Plan and Services In-house Referral: Clinical Social Work Discharge Planning Services: CM Consult Post Acute Care Choice: Skilled Nursing Facility Living arrangements for the past 2 months: Apartment                                       Social Drivers of Health (SDOH) Interventions SDOH Screenings   Food Insecurity: Food Insecurity Present (05/14/2024)  Housing: Low Risk  (05/14/2024)  Transportation Needs: No Transportation Needs (05/14/2024)  Utilities: Not At Risk (05/14/2024)  Alcohol Screen: Low Risk  (03/21/2024)  Depression (PHQ2-9): Low Risk  (03/22/2024)  Financial Resource Strain: Medium Risk (03/21/2024)  Physical Activity: Inactive (03/21/2024)  Social Connections: Socially Isolated (05/14/2024)  Stress: Stress Concern Present (03/21/2024)  Tobacco Use: Medium Risk (05/14/2024)  Health Literacy: Adequate Health Literacy (09/28/2023)    Readmission Risk Interventions    05/15/2024    1:54 PM 03/15/2024   10:34 AM  Readmission Risk Prevention Plan   Transportation Screening Complete Complete  PCP or Specialist Appt within 5-7 Days  Complete  Home Care Screening Complete Complete  Medication Review (RN CM) Complete Complete

## 2024-05-16 NOTE — Progress Notes (Signed)
 Initial Nutrition Assessment  DOCUMENTATION CODES:   Not applicable  INTERVENTION:   -Continue regular diet -Continue Ensure Plus High Protein po BID, each supplement provides 350 kcal and 20 grams of protein  -MVI with minerals daily -RD provided Nutrition Tips for Managing Obesity Medications handout from [facility] Nutrition Care Manual; attached to AVS/ discharge summary  NUTRITION DIAGNOSIS:   Increased nutrient needs related to acute illness as evidenced by estimated needs.  GOAL:   Patient will meet greater than or equal to 90% of their needs  MONITOR:   PO intake, Supplement acceptance  REASON FOR ASSESSMENT:   Malnutrition Screening Tool    ASSESSMENT:   Pt with medical history significant diabetes mellitus T2 insulin  requiring also on Monjaro, paroxysmal atrial fibrillation on Eliquis , C. difficile colitis with completion of oral Vanco 7 months ago,  chronic urinary retention, recurrent UTIs with ESBL Klebsiella last month.  She completed 14 days of IV ertapenem  3 weeks ago PTA.  Pt admitted with recurrent UTI with history of ESBL and persistent diarrhea with histroy of constipation and c-diff.   10/7- rectal tube  Reviewed I/O's: +494 ml x 24 hours and +393 ml since admission  UOP: 1.3 L x 24 hours  Pt unavailable at time of visit. Attempted to speak with pt via call to hospital room phone, however, unable to reach. RD unable to obtain further nutrition-related history or complete nutrition-focused physical exam at this time.    Per H&P, pt has been experiencing lower abdominal pain and diarrhea for 4 days PTA. She has lost greater than 130# and states that food is no longer appealing (suspect that this is related to Mounjaro ).   She is currently on a regular diet. Noted meal completions 50%. Case discussed with RN, who reports oral intake is improving, currently sitting up and eating breakfast. She has been accepting Ensure supplements.   Reviewed wt hx; pt  has experienced a 11% wt loss over the past 6 months. Pt is at high risk for malnutrition, however, unable to identify at this time. Pt would greatly benefit from addition of oral nutrition supplements.   Per TOC notes, plan for SNF placement.   Medications reviewed and include neurontin  and 0.9% sodium chloride  infusiuon @ 75 ml/hr.   Lab Results  Component Value Date   HGBA1C 8.9 (H) 03/11/2024   PTA DM medications are 30 units novolin  70/30 BID and 15 units mounjaro  daily.   Labs reviewed: Phos: 2.2, CBGS: 77-323 (inpatient orders for glycemic control are 0-6 units insulin  aspart TID with meals and 30 units insulin  aspart protamine-aspart BID).    Diet Order:   Diet Order             Diet regular Room service appropriate? Yes; Fluid consistency: Thin  Diet effective now                   EDUCATION NEEDS:   Education needs have been addressed  Skin:  Skin Assessment: Reviewed RN Assessment  Last BM:  05/15/24 (via rectal tube)  Height:   Ht Readings from Last 1 Encounters:  05/14/24 5' 6 (1.676 m)    Weight:   Wt Readings from Last 1 Encounters:  05/14/24 82.3 kg    Ideal Body Weight:  59.1 kg  BMI:  Body mass index is 29.28 kg/m.  Estimated Nutritional Needs:   Kcal:  1850-2050  Protein:  90-105 grams  Fluid:  1.8-2.0 L    Margery ORN, RD, LDN, CDCES Registered  Dietitian III Certified Diabetes Care and Education Specialist If unable to reach this RD, please use RD Inpatient group chat on secure chat between hours of 8am-4 pm daily

## 2024-05-16 NOTE — Plan of Care (Signed)
  Problem: Education: Goal: Knowledge of General Education information will improve Description: Including pain rating scale, medication(s)/side effects and non-pharmacologic comfort measures Outcome: Progressing   Problem: Clinical Measurements: Goal: Ability to maintain clinical measurements within normal limits will improve Outcome: Progressing   Problem: Activity: Goal: Risk for activity intolerance will decrease Outcome: Progressing   Problem: Pain Managment: Goal: General experience of comfort will improve and/or be controlled Outcome: Progressing   Problem: Safety: Goal: Ability to remain free from injury will improve Outcome: Progressing   Problem: Skin Integrity: Goal: Risk for impaired skin integrity will decrease Outcome: Progressing   Problem: Metabolic: Goal: Ability to maintain appropriate glucose levels will improve Outcome: Progressing

## 2024-05-17 DIAGNOSIS — N3000 Acute cystitis without hematuria: Secondary | ICD-10-CM | POA: Diagnosis not present

## 2024-05-17 LAB — CBC
HCT: 25.8 % — ABNORMAL LOW (ref 36.0–46.0)
Hemoglobin: 7.8 g/dL — ABNORMAL LOW (ref 12.0–15.0)
MCH: 24.2 pg — ABNORMAL LOW (ref 26.0–34.0)
MCHC: 30.2 g/dL (ref 30.0–36.0)
MCV: 80.1 fL (ref 80.0–100.0)
Platelets: 279 K/uL (ref 150–400)
RBC: 3.22 MIL/uL — ABNORMAL LOW (ref 3.87–5.11)
RDW: 17.9 % — ABNORMAL HIGH (ref 11.5–15.5)
WBC: 8.5 K/uL (ref 4.0–10.5)
nRBC: 0 % (ref 0.0–0.2)

## 2024-05-17 LAB — GLUCOSE, CAPILLARY
Glucose-Capillary: 151 mg/dL — ABNORMAL HIGH (ref 70–99)
Glucose-Capillary: 270 mg/dL — ABNORMAL HIGH (ref 70–99)

## 2024-05-17 LAB — BASIC METABOLIC PANEL WITH GFR
Anion gap: 9 (ref 5–15)
BUN: 16 mg/dL (ref 8–23)
CO2: 22 mmol/L (ref 22–32)
Calcium: 8.9 mg/dL (ref 8.9–10.3)
Chloride: 106 mmol/L (ref 98–111)
Creatinine, Ser: 1.11 mg/dL — ABNORMAL HIGH (ref 0.44–1.00)
GFR, Estimated: 54 mL/min — ABNORMAL LOW (ref >60)
Glucose, Bld: 138 mg/dL — ABNORMAL HIGH (ref 70–99)
Potassium: 3.7 mmol/L (ref 3.5–5.1)
Sodium: 137 mmol/L (ref 135–145)

## 2024-05-17 LAB — PHOSPHORUS: Phosphorus: 4.5 mg/dL (ref 2.5–4.6)

## 2024-05-17 LAB — MAGNESIUM: Magnesium: 2.2 mg/dL (ref 1.7–2.4)

## 2024-05-17 MED ORDER — ADULT MULTIVITAMIN W/MINERALS CH: 1.0000 | ORAL_TABLET | Freq: Every day | ORAL | Status: AC

## 2024-05-17 MED ORDER — GUAIFENESIN 100 MG/5ML PO LIQD: 5.0000 mL | ORAL | 0 refills | Status: AC | PRN

## 2024-05-17 MED ORDER — CEPHALEXIN 500 MG PO CAPS: 500.0000 mg | ORAL_CAPSULE | Freq: Two times a day (BID) | ORAL | 0 refills | Status: AC

## 2024-05-17 MED ORDER — SACCHAROMYCES BOULARDII 250 MG PO CAPS: 250.0000 mg | ORAL_CAPSULE | Freq: Two times a day (BID) | ORAL | 0 refills | Status: AC

## 2024-05-17 MED ORDER — VANCOMYCIN HCL 125 MG PO CAPS: 125.0000 mg | ORAL_CAPSULE | Freq: Four times a day (QID) | ORAL | 0 refills | Status: AC

## 2024-05-17 MED ORDER — ENSURE PLUS HIGH PROTEIN PO LIQD: 237.0000 mL | Freq: Two times a day (BID) | ORAL | 0 refills | Status: AC

## 2024-05-17 NOTE — Discharge Summary (Signed)
 Physician Discharge Summary  Amy Livingston FMW:[id number] DOB: 07/23/1956 DOA: 05/14/2024  PCP: Edman Meade PEDLAR, FNP  Admit date: 05/14/2024 Discharge date: 05/17/2024  Admitted From: Home  Discharge disposition: Home with home health   Recommendations for Outpatient Follow-Up:   Follow up with your primary care provider in one week.  Check CBC, BMP, magnesium  in the next visit Patient has been given oral vancomycin  for history of C. difficile with oral Keflex .  Will need to closely monitor electrolytes as outpatient.   Discharge Diagnosis:   Principal Problem:   UTI (urinary tract infection) Active Problems:   Hypokalemia   Hyponatremia   Uncontrolled type 2 diabetes mellitus with hyperglycemia, with long-term current use of insulin  (HCC)   Diabetes 1.5, managed as type 2 (HCC)   Paroxysmal atrial fibrillation (HCC)   Weight loss due to medication   Discharge Condition: Improved.  Diet recommendation: Regular.  Wound care: None.  Code status: Full.   History of Present Illness:   69 year old with history of DM2, paroxysmal A-fib on Eliquis , C. difficile colitis completed oral vancomycin  7 months ago, urinary retention with recurrent UTIs completed 14 days of IV ertapenem  3 days prior to presentation presented to hospital again with abdominal pain, diarrhea fever and chills. Patient was then admitted to hospital for further evaluation and treatment.    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  Patient initially received ertapenem  but since culture was showing Klebsiella pneumonia has been transitioned to Keflex  on discharge.  Patient will continue 2 more days to complete 5-day course.    Persistent diarrhea History of C. difficile infection. Patient was empirically started on p.o. vancomycin  while on antibiotic  treatment.  GI pathogen panel negative.  Previous provider had discussed with ID.  Will need oral vancomycin  for 5 to 7 days post treatment for UTI.  Patient was on rectal tube due to persistent diarrhea.  This has been discontinued at this time.  Patient will continue oral vancomycin  for next 7 days on discharge and Keflex  will be continued for 2 more days to complete 5-day course.   Hypokalemia Improved after repletion.  Potassium prior to discharge was 3.7   hypomagnesemia/hypocalcemia Improved after replacement.  Magnesium  prior to discharge was 2.2.  Calcium  was 8.9   Hypophosphatemia.  Improved after repletion.  Phosphorus prior to discharge was 4.5   Hyponatremia Resolved   Diabetes mellitus type 2 with hyperglycemia, uncontrolled Continue home insulin  70/30, 30 units twice daily,.  Continue diabetic diet  History of Paroxysmal atrial fibrillation Continue Eliquis    Hyperlipidemia Continue Lipitor on discharge   Debility deconditioning.  Physical therapy has recommended skilled nursing facility but patient wishes to go home with home health at this time.  Disposition.  At this time, patient is stable for disposition home with outpatient PCP follow-up  Medical Consultants:   Infectious disease-verbal consult  Procedures:    Rectal tube placement and removal Subjective:   Today, patient was seen and examined at bedside.  Denies any shortness of breath dyspnea pain nausea vomiting.  Still has some loose stools.  Discharge Exam:   Vitals:   05/16/24 1941 05/17/24 0335  BP: 132/77 131/63  Pulse: 93 85  Resp: 17 17  Temp: 98.7 F (37.1 C) 98.3 F (36.8 C)  SpO2: 98% 95%   Vitals:   05/16/24 0405 05/16/24 1534 05/16/24 1941 05/17/24 0335  BP: (!) 128/55 (!) 122/51 132/77 131/63  Pulse: (!) 110 89 93 85  Resp: 16 18 17 17   Temp: 98.5 F (36.9 C) 98.6 F (37 C) 98.7 F (37.1 C) 98.3 F (36.8 C)  TempSrc: Oral Oral Oral Oral  SpO2: 94% 96% 98% 95%  Weight:       Height:       Body mass index is 29.28 kg/m.  General: Alert awake, not in obvious distress HENT: pupils equally reacting to light,  No scleral pallor or icterus noted. Oral mucosa is moist.  Chest:  Clear breath sounds.  Diminished breath sounds bilaterally. No crackles or wheezes.  CVS: S1 &S2 heard. No murmur.  Regular rate and rhythm. Abdomen: Soft, nontender, nondistended.  Bowel sounds are heard.   Extremities: No cyanosis, clubbing or edema.  Peripheral pulses are palpable. Psych: Alert, awake and oriented, normal mood CNS:  No cranial nerve deficits.  Power equal in all extremities.   Skin: Warm and dry.  No rashes noted.  The results of significant diagnostics from this hospitalization (including imaging, microbiology, ancillary and laboratory) are listed below for reference.     Diagnostic Studies:   No results found.   Labs:   Basic Metabolic Panel: Recent Labs  Lab 05/14/24 1219 05/14/24 1226 05/15/24 0548 05/16/24 0620 05/17/24 0553  NA 129* 132* 130* 136 137  K 3.4* 3.3* 3.5 3.6 3.7  CL 97* 97* 100 106 106  CO2 20*  --  20* 20* 22  GLUCOSE 436* 448* 311* 77 138*  BUN 15 13 13 15 16   CREATININE 1.20* 1.20* 1.13* 1.13* 1.11*  CALCIUM  8.7*  --  8.3* 9.0 8.9  MG  --   --  1.5* 2.4 2.2  PHOS  --   --   --  2.2* 4.5   GFR Estimated Creatinine Clearance: 53.2 mL/min (A) (by C-G formula based on SCr of 1.11 mg/dL (H)). Liver Function Tests: Recent Labs  Lab 05/14/24 1219  AST 12*  ALT 6  ALKPHOS 139*  BILITOT 0.4  PROT 6.2*  ALBUMIN 2.7*   No results for input(s): LIPASE, AMYLASE in the last 168 hours. No results for input(s): AMMONIA in the last 168 hours. Coagulation profile No results for input(s): INR, PROTIME in the last 168 hours.  CBC: Recent Labs  Lab 05/14/24 1219 05/14/24 1226 05/15/24 0548 05/16/24 0620 05/17/24 0553  WBC 10.4  --  11.1* 11.6* 8.5  HGB 8.7* 9.5* 7.6* 7.6* 7.8*  HCT 29.2* 28.0* 25.3* 24.9* 25.8*   MCV 80.4  --  80.1 80.3 80.1  PLT 292  --  269 289 279   Cardiac Enzymes: No results for input(s): CKTOTAL, CKMB, CKMBINDEX, TROPONINI in the last 168 hours. BNP: Invalid input(s): POCBNP CBG: Recent Labs  Lab 05/16/24 0735 05/16/24 1111 05/16/24 1640 05/16/24 2031 05/17/24 0711  GLUCAP 77 179* 133* 124* 151*   D-Dimer No results for input(s): DDIMER in the last 72 hours. Hgb A1c No results for input(s): HGBA1C in the last 72 hours. Lipid Profile No results for input(s): CHOL, HDL, LDLCALC, TRIG, CHOLHDL, LDLDIRECT in the last 72 hours. Thyroid  function studies No results for input(s): TSH, T4TOTAL, T3FREE, THYROIDAB in the last 72 hours.  Invalid input(s): FREET3 Anemia work up No results for input(s): VITAMINB12, FOLATE, FERRITIN, TIBC, IRON, RETICCTPCT in the last 72 hours. Microbiology Recent Results (from the past 240 hours)  C Difficile Quick Screen w PCR reflex     Status: Abnormal   Collection Time:  05/14/24  3:05 PM   Specimen: Urine, Catheterized; Stool  Result Value Ref Range Status   C Diff antigen POSITIVE (A) NEGATIVE Final   C Diff toxin NEGATIVE NEGATIVE Final   C Diff interpretation Results are indeterminate. See PCR results.  Final    Comment: Performed at [facility], [address] [place], [place] [address]  Gastrointestinal Panel by PCR , Stool     Status: None   Collection Time: 05/14/24  3:05 PM   Specimen: Urine, Catheterized; Stool  Result Value Ref Range Status   Campylobacter species NOT DETECTED NOT DETECTED Final   Plesimonas shigelloides NOT DETECTED NOT DETECTED Final   Salmonella species NOT DETECTED NOT DETECTED Final   Yersinia enterocolitica NOT DETECTED NOT DETECTED Final   Vibrio species NOT DETECTED NOT DETECTED Final   Vibrio cholerae NOT DETECTED NOT DETECTED Final   Enteroaggregative E coli (EAEC) NOT DETECTED NOT DETECTED Final   Enteropathogenic E coli (EPEC) NOT DETECTED  NOT DETECTED Final   Enterotoxigenic E coli (ETEC) NOT DETECTED NOT DETECTED Final   Shiga like toxin producing E coli (STEC) NOT DETECTED NOT DETECTED Final   Shigella/Enteroinvasive E coli (EIEC) NOT DETECTED NOT DETECTED Final   Cryptosporidium NOT DETECTED NOT DETECTED Final   Cyclospora cayetanensis NOT DETECTED NOT DETECTED Final   Entamoeba histolytica NOT DETECTED NOT DETECTED Final   Giardia lamblia NOT DETECTED NOT DETECTED Final   Adenovirus F40/41 NOT DETECTED NOT DETECTED Final   Astrovirus NOT DETECTED NOT DETECTED Final   Norovirus GI/GII NOT DETECTED NOT DETECTED Final   Rotavirus A NOT DETECTED NOT DETECTED Final   Sapovirus (I, II, IV, and V) NOT DETECTED NOT DETECTED Final    Comment: Performed at [facility], [address] [place], [place] [address]  C. Diff by PCR, Reflexed     Status: Abnormal   Collection Time: 05/14/24  3:05 PM  Result Value Ref Range Status   Toxigenic C. Difficile by PCR POSITIVE (A) NEGATIVE Final    Comment: Positive for toxigenic C. difficile with little to no toxin production. Only treat if clinical presentation suggests symptomatic illness.   Hypervirulent Strain PRESUMPTIVE NEGATIVE PRESUMPTIVE NEGATIVE Final    Comment: Performed at [facility], [address] [place], [place] [address]  Urine Culture     Status: Abnormal   Collection Time: 05/14/24  3:05 PM   Specimen: Urine, Clean Catch  Result Value Ref Range Status   Specimen Description   Final    URINE, CLEAN CATCH Performed at [facility], [address] [place], [place] [address]    Special Requests   Final    NONE Performed at [facility], [address] [place], [place] [address]    Culture >=100,000 COLONIES/mL KLEBSIELLA PNEUMONIAE (A)  Final   Report Status 05/16/2024 FINAL  Final   Organism ID, Bacteria KLEBSIELLA PNEUMONIAE (A)  Final      Susceptibility   Klebsiella pneumoniae - MIC*    AMPICILLIN RESISTANT Resistant     CEFAZOLIN  (URINE)  Value in next row Sensitive      2 SENSITIVEThis is a modified FDA-approved test that has been validated and its performance characteristics determined by the reporting laboratory.  This laboratory is certified under the Clinical Laboratory Improvement Amendments CLIA as qualified to perform high complexity clinical laboratory testing.    CEFEPIME  Value in next row Sensitive      2 SENSITIVEThis is a modified FDA-approved test that has been validated and its  performance characteristics determined by the reporting laboratory.  This laboratory is certified under the Clinical Laboratory Improvement Amendments CLIA as qualified to perform high complexity clinical laboratory testing.    ERTAPENEM  Value in next row Sensitive      2 SENSITIVEThis is a modified FDA-approved test that has been validated and its performance characteristics determined by the reporting laboratory.  This laboratory is certified under the Clinical Laboratory Improvement Amendments CLIA as qualified to perform high complexity clinical laboratory testing.    CEFTRIAXONE  Value in next row Sensitive      2 SENSITIVEThis is a modified FDA-approved test that has been validated and its performance characteristics determined by the reporting laboratory.  This laboratory is certified under the Clinical Laboratory Improvement Amendments CLIA as qualified to perform high complexity clinical laboratory testing.    CIPROFLOXACIN Value in next row Sensitive      2 SENSITIVEThis is a modified FDA-approved test that has been validated and its performance characteristics determined by the reporting laboratory.  This laboratory is certified under the Clinical Laboratory Improvement Amendments CLIA as qualified to perform high complexity clinical laboratory testing.    GENTAMICIN Value in next row Sensitive      2 SENSITIVEThis is a modified FDA-approved test that has been validated and its performance characteristics determined by the reporting  laboratory.  This laboratory is certified under the Clinical Laboratory Improvement Amendments CLIA as qualified to perform high complexity clinical laboratory testing.    NITROFURANTOIN  Value in next row Resistant      2 SENSITIVEThis is a modified FDA-approved test that has been validated and its performance characteristics determined by the reporting laboratory.  This laboratory is certified under the Clinical Laboratory Improvement Amendments CLIA as qualified to perform high complexity clinical laboratory testing.    TRIMETH/SULFA Value in next row Sensitive      2 SENSITIVEThis is a modified FDA-approved test that has been validated and its performance characteristics determined by the reporting laboratory.  This laboratory is certified under the Clinical Laboratory Improvement Amendments CLIA as qualified to perform high complexity clinical laboratory testing.    AMPICILLIN/SULBACTAM Value in next row Sensitive      2 SENSITIVEThis is a modified FDA-approved test that has been validated and its performance characteristics determined by the reporting laboratory.  This laboratory is certified under the Clinical Laboratory Improvement Amendments CLIA as qualified to perform high complexity clinical laboratory testing.    PIP/TAZO Value in next row Sensitive      <=4 SENSITIVEThis is a modified FDA-approved test that has been validated and its performance characteristics determined by the reporting laboratory.  This laboratory is certified under the Clinical Laboratory Improvement Amendments CLIA as qualified to perform high complexity clinical laboratory testing.    MEROPENEM  Value in next row Sensitive      <=4 SENSITIVEThis is a modified FDA-approved test that has been validated and its performance characteristics determined by the reporting laboratory.  This laboratory is certified under the Clinical Laboratory Improvement Amendments CLIA as qualified to perform high complexity clinical laboratory  testing.    * >=100,000 COLONIES/mL KLEBSIELLA PNEUMONIAE  MRSA Next Gen by PCR, Nasal     Status: None   Collection Time: 05/14/24  9:33 PM   Specimen: Nasal Mucosa; Nasal Swab  Result Value Ref Range Status   MRSA by PCR Next Gen NOT DETECTED NOT DETECTED Final    Comment: (NOTE) The GeneXpert MRSA Assay (FDA approved for NASAL specimens only), is one component of a comprehensive  MRSA colonization surveillance program. It is not intended to diagnose MRSA infection nor to guide or monitor treatment for MRSA infections. Test performance is not FDA approved in patients less than 59 years old. Performed at [facility], [address], [place], [place] [address]      Discharge Instructions:   Discharge Instructions     Call MD for:  persistant nausea and vomiting   Complete by: As directed    Call MD for:  severe uncontrolled pain   Complete by: As directed    Call MD for:  temperature >100.4   Complete by: As directed    Diet general   Complete by: As directed    Discharge instructions   Complete by: As directed    Complete the course of antibiotic.  Complete the course of oral vancomycin .  Follow-up with your primary care provider in 1 week.  Check blood work at that time.  Seek medical attention for worsening symptoms.   Increase activity slowly   Complete by: As directed       Allergies as of 05/17/2024       Reactions   Elemental Sulfur Itching, Rash   Yeast infections   Metformin  And Related Diarrhea   GI distress        Medication List     STOP taking these medications    omeprazole  40 MG capsule Commonly known as: PRILOSEC   pantoprazole  40 MG tablet Commonly known as: PROTONIX        TAKE these medications    Accu-Chek Guide Test test strip Generic drug: glucose blood USE TO TEST BLOOD SUGAR EVERY MORNING, AT NOON, AND EVERY NIGHT AT BEDTIME.   acetaminophen  325 MG tablet Commonly known as: TYLENOL  Take 2 tablets (650 mg total) by mouth every  6 (six) hours as needed for mild pain (pain score 1-3) or fever (or Fever >/= 101).   atorvastatin  80 MG tablet Commonly known as: LIPITOR TAKE 1 TABLET(80 MG) BY MOUTH DAILY   busPIRone  10 MG tablet Commonly known as: BUSPAR  Take 1 tablet (10 mg total) by mouth 2 (two) times daily.   cephALEXin  500 MG capsule Commonly known as: KEFLEX  Take 1 capsule (500 mg total) by mouth 2 (two) times daily for 2 days.   cyclobenzaprine  10 MG tablet Commonly known as: FLEXERIL  Take 10 mg by mouth every 12 (twelve) hours.   Eliquis  5 MG Tabs tablet Generic drug: apixaban  Take 5 mg by mouth 2 (two) times daily.   estradiol  0.1 MG/GM vaginal cream Commonly known as: ESTRACE  Place 0.5 g vaginally 2 (two) times a week. Place 0.5g nightly for two weeks then twice a week after   famotidine  20 MG tablet Commonly known as: PEPCID  Take 1 tablet (20 mg total) by mouth daily.   feeding supplement Liqd Take 237 mLs by mouth 2 (two) times daily between meals for 14 days.   gabapentin  300 MG capsule Commonly known as: NEURONTIN  Take 1 capsule (300 mg total) by mouth 3 (three) times daily.   guaiFENesin 100 MG/5ML liquid Commonly known as: ROBITUSSIN Take 5 mLs by mouth every 4 (four) hours as needed for cough or to loosen phlegm.   meclizine  25 MG tablet Commonly known as: ANTIVERT  Take 1 tablet (25 mg total) by mouth daily as needed for dizziness.   mirabegron  ER 25 MG Tb24 tablet Commonly known as: Myrbetriq  Take 1 tablet (25 mg total) by mouth at bedtime.   mometasone  0.1 % cream Commonly known as: ELOCON  Apply  topically daily as needed for itch   multivitamin with minerals Tabs tablet Take 1 tablet by mouth daily. Start taking on: May 18, 2024   NovoLIN  70/30 Kwikpen (70-30) 100 UNIT/ML KwikPen Generic drug: insulin  isophane & regular human KwikPen Inject 30 Units into the skin 2 (two) times daily with a meal.   ondansetron  4 MG tablet Commonly known as: ZOFRAN  Take 1 tablet  (4 mg total) by mouth every 6 (six) hours as needed for nausea.   oxyCODONE -acetaminophen  10-325 MG tablet Commonly known as: PERCOCET Take 1 tablet by mouth every 8 (eight) hours as needed for pain.   saccharomyces boulardii 250 MG capsule Commonly known as: FLORASTOR Take 1 capsule (250 mg total) by mouth 2 (two) times daily for 14 days.   sertraline  50 MG tablet Commonly known as: ZOLOFT  TAKE 1 TABLET(50 MG) BY MOUTH DAILY   sucralfate  1 g tablet Commonly known as: Carafate  Take 1 tablet (1 g total) by mouth 4 (four) times daily -  with meals and at bedtime.   tirzepatide  15 MG/0.5ML Pen Commonly known as: MOUNJARO  Inject 15 mg into the skin once a week.   traZODone  100 MG tablet Commonly known as: DESYREL  Take 1 tablet (100 mg total) by mouth at bedtime.   vancomycin  125 MG capsule Commonly known as: VANCOCIN  Take 1 capsule (125 mg total) by mouth 4 (four) times daily for 7 days.   vitamin C  100 MG tablet Take 1 tablet (100 mg total) by mouth daily.               Durable Medical Equipment  (From admission, onward)           Start     Ordered   05/16/24 1323  For home use only DME Bedside commode  Once       Question:  Patient needs a bedside commode to treat with the following condition  Answer:  Ambulatory dysfunction   05/16/24 1323            Follow-up Information     Bacchus, Meade PEDLAR, FNP Follow up in 1 week(s).   Specialty: Family Medicine Contact information: [address] [place] [address] [place] [address] [phone]                  Time coordinating discharge: 39 minutes  Signed:  Laytoya Ion  [facility] 05/17/2024, 12:01 PM

## 2024-05-17 NOTE — Care Management Important Message (Signed)
 Important Message  Patient Details  Name: Amy Livingston MRN: [id number] Date of Birth: 08/19/1955   Important Message Given:  Yes - Medicare IM     [name] 05/17/2024, 12:20 PM

## 2024-05-17 NOTE — Plan of Care (Signed)

## 2024-05-17 NOTE — TOC Transition Note (Signed)
 Transition of Care [facility]) - Discharge Note   Patient Details  Name: Amy Livingston MRN: [id number] Date of Birth: [birth year]  Transition of [facility]) CM/SW Contact:  Lucie Lunger, LCSWA Phone Number: 05/17/2024, 12:10 PM   Clinical Narrative:    CSW updated that [facility] PT OT arranged with [facility] at pt request, MD placed HH orders. CSW provided update to [place] with [facility] of plan for D/C today. CSW spoke with pt at bedside to review that [facility] is not able to be ordered at this time as pt received one 8/25. Pt states she thinks it is upstairs in her home and she has someone who can get it for her. TOC signing off.   Final next level of care: Home w Home Health Services Barriers to Discharge: Barriers Resolved   Patient Goals and CMS Choice Patient states their goals for this hospitalization and ongoing recovery are:: return home CMS Medicare.gov Compare Post Acute Care list provided to:: Patient Choice offered to / list presented to : Patient      Discharge Placement                       Discharge Plan and Services Additional resources added to the After Visit Summary for   In-house Referral: Clinical Social Work Discharge Planning Services: CM Consult Post Acute Care Choice: Skilled Nursing Facility                    HH Arranged: PT, OT [facility] Agency: [facility] Date [facility] Agency Contacted: 05/17/24   Representative spoke with at [facility] Agency: Selinda  Social Drivers of Health (SDOH) Interventions SDOH Screenings   Food Insecurity: Food Insecurity Present (05/14/2024)  Housing: Low Risk  (05/14/2024)  Transportation Needs: No Transportation Needs (05/14/2024)  Utilities: Not At Risk (05/14/2024)  Alcohol Screen: Low Risk  (03/21/2024)  Depression (PHQ2-9): Low Risk  (03/22/2024)  Financial Resource Strain: Medium Risk (03/21/2024)  Physical Activity: Inactive (03/21/2024)  Social Connections: Socially Isolated (05/14/2024)  Stress: Stress Concern  Present (03/21/2024)  Tobacco Use: Medium Risk (05/14/2024)  Health Literacy: Adequate Health Literacy (09/28/2023)     Readmission Risk Interventions    05/16/2024    1:16 PM 05/15/2024    1:54 PM 03/15/2024   10:34 AM  Readmission Risk Prevention Plan  Transportation Screening Complete Complete Complete  PCP or Specialist Appt within 5-7 Days   Complete  Home Care Screening Complete Complete Complete  Medication Review (RN CM) Complete Complete Complete

## 2024-05-18 ENCOUNTER — Telehealth: Payer: Self-pay

## 2024-05-18 NOTE — Transitions of Care (Post Inpatient/ED Visit) (Signed)
   05/18/2024  Name: Tijuana Scheidegger MRN: [id number] DOB: 09-15-1955  Today's TOC FU Call Status: Today's TOC FU Call Status:: Unsuccessful Call (1st Attempt) Unsuccessful Call (1st Attempt) Date: 05/18/24  Attempted to reach the patient regarding the most recent Inpatient/ED visit. Left a HIPAA approved voicemail message to phone number provided in demographics per DPR.    Follow Up Plan: Additional outreach attempts will be made to reach the patient to complete the Transitions of Care (Post Inpatient/ED visit) call.   Richerd Fish, RN, BSN, CCM [facility], [facility] RN Care Manager Direct Dial: [phone]

## 2024-05-22 ENCOUNTER — Telehealth: Payer: Self-pay

## 2024-05-22 NOTE — Transitions of Care (Post Inpatient/ED Visit) (Signed)
   05/22/2024  Name: Amy Livingston MRN: [id number] DOB: 02-19-1956  Today's TOC FU Call Status: Today's TOC FU Call Status:: Unsuccessful Call (2nd Attempt)  Attempted to reach the patient regarding the most recent Inpatient/ED visit.  Left a HIPAA approved voicemail message to phone number provided in demographics per DPR.    Follow Up Plan: Additional outreach attempts will be made to reach the patient to complete the Transitions of Care (Post Inpatient/ED visit) call.   Richerd Fish, RN, BSN, CCM [facility], [facility] RN Care Manager Direct Dial: [phone]

## 2024-05-23 ENCOUNTER — Telehealth: Payer: Self-pay

## 2024-05-23 NOTE — Transitions of Care (Post Inpatient/ED Visit) (Signed)
   05/23/2024  Name: Amy Livingston MRN: [id number] DOB: Nov 01, 1955  Today's TOC FU Call Status: Today's TOC FU Call Status:: Unsuccessful Call (3rd Attempt) Unsuccessful Call (2nd Attempt) Date: 05/22/24 Unsuccessful Call (3rd Attempt) Date: 05/23/24  Attempted to reach the patient regarding the most recent Inpatient/ED visit.  Follow Up Plan: No further outreach attempts will be made at this time. We have been unable to contact the patient.  Richerd Fish, RN, BSN, CCM [facility], [facility] RN Care Manager Direct Dial: [phone]

## 2024-05-24 ENCOUNTER — Ambulatory Visit: Payer: Self-pay

## 2024-05-24 NOTE — Telephone Encounter (Signed)
 FYI Only or Action Required?: Action required by provider: Needs to be scheduled still- needs daughters availability.  Patient was last seen in primary care on 03/22/2024 by Edman Meade PEDLAR, FNP.  Called Nurse Triage reporting Fall.  Symptoms began yesterday.  Interventions attempted: Rest, hydration, or home remedies.  Symptoms are: gradually worsening.  Triage Disposition: See PCP When Office is Open (Within 3 Days)  Patient/caregiver understands and will follow disposition?:   Copied from CRM #[id number]. Topic: Clinical - Red Word Triage >> May 24, 2024  2:36 PM Turkey B wrote: [facility] that prompted transfer to Nurse Triage: Patient has had falls in the last 2 days, right side of buttocks is painful,feel like stinging Reason for Disposition  [1] Taking Coumadin (warfarin) or other strong blood thinner AND [2] falling is a recurrent problem  Answer Assessment - Initial Assessment Questions Recent admission with di/c on 10/9 2 falls in 2 days- using a walker currently due to generalized weakness from admision. One fall her legs gave out when she did not have the walker and she landed on her buttocks. Second fall, legs gave out again but couldn't stop herself.  Hydrating, not eating great- advised to work on protein intake Light headed when sitting to stand- was happening inpatient. Her BP has been fluctuating at her appts with low 90/50 -120/62 Denies any new neurological sx. Needs to connect with daughter regarding scheduling an appt. Will call back to schedule. ED/UC/Callback instructions given.   1. MECHANISM: How did the fall happen?     States her legs gave out- Daughter was with  3. ONSET: When did the fall happen? (e.g., minutes, hours, or days ago)     10/14 and 10/15 4. LOCATION: What part of the body hit the ground? (e.g., back, buttocks, head, hips, knees, hands, head, stomach)     Fell onto her bottom - denies hitting her head 5. INJURY: Did you hurt (injure)  yourself when you fell? If Yes, ask: What did you injure? Tell me more about this? (e.g., body area; type of injury; pain severity)     R buttock hurts to sit, denies bruising at this time 6. PAIN: Is there any pain? If Yes, ask: How bad is the pain? (e.g., Scale 0-10; or none, mild,      8/10 when sitting on her bottom - like lectricity goes through it when she touches it  7. SIZE: For cuts, bruises, or swelling, ask: How large is it? (e.g., inches or centimeters)      Like silver dollar size 9. OTHER SYMPTOMS: Do you have any other symptoms? (e.g., dizziness, fever, weakness; new-onset or worsening).      Chronic back pain/sciatica- not worsened 10. CAUSE: What do you think caused the fall (or falling)? (e.g., dizzy spell, tripped)       Unsure- 120/62 and 110/60, pain clinic 90/50  Protocols used: Falls and [facility]

## 2024-05-28 ENCOUNTER — Ambulatory Visit: Admitting: Obstetrics

## 2024-05-31 ENCOUNTER — Emergency Department (HOSPITAL_COMMUNITY)
Admission: EM | Admit: 2024-05-31 | Discharge: 2024-05-31 | Disposition: A | Discharge status: Home / Self Care | Attending: Emergency Medicine | Admitting: Emergency Medicine

## 2024-05-31 ENCOUNTER — Emergency Department (HOSPITAL_COMMUNITY)

## 2024-05-31 ENCOUNTER — Other Ambulatory Visit: Payer: Self-pay

## 2024-05-31 ENCOUNTER — Other Ambulatory Visit: Payer: Self-pay | Admitting: Family Medicine

## 2024-05-31 ENCOUNTER — Encounter (HOSPITAL_COMMUNITY): Payer: Self-pay

## 2024-05-31 DIAGNOSIS — E1165 Type 2 diabetes mellitus with hyperglycemia: Secondary | ICD-10-CM | POA: Diagnosis not present

## 2024-05-31 DIAGNOSIS — Z7901 Long term (current) use of anticoagulants: Secondary | ICD-10-CM | POA: Diagnosis not present

## 2024-05-31 DIAGNOSIS — Z794 Long term (current) use of insulin: Secondary | ICD-10-CM | POA: Diagnosis not present

## 2024-05-31 DIAGNOSIS — R300 Dysuria: Secondary | ICD-10-CM | POA: Diagnosis present

## 2024-05-31 DIAGNOSIS — N390 Urinary tract infection, site not specified: Secondary | ICD-10-CM

## 2024-05-31 DIAGNOSIS — R102 Pelvic and perineal pain unspecified side: Secondary | ICD-10-CM | POA: Diagnosis not present

## 2024-05-31 DIAGNOSIS — Z7984 Long term (current) use of oral hypoglycemic drugs: Secondary | ICD-10-CM | POA: Diagnosis not present

## 2024-05-31 DIAGNOSIS — R739 Hyperglycemia, unspecified: Secondary | ICD-10-CM

## 2024-05-31 DIAGNOSIS — R531 Weakness: Secondary | ICD-10-CM

## 2024-05-31 DIAGNOSIS — F411 Generalized anxiety disorder: Secondary | ICD-10-CM

## 2024-05-31 LAB — URINALYSIS, W/ REFLEX TO CULTURE (INFECTION SUSPECTED)
Bilirubin Urine: NEGATIVE
Glucose, UA: >=500 mg/dL — AB
Ketones, ur: NEGATIVE mg/dL
Nitrite: POSITIVE — AB
Protein, ur: 30 mg/dL — AB
Specific Gravity, Urine: 1.007 (ref 1.005–1.030)
WBC, UA: >50 WBC/hpf (ref 0–5)
pH: 7 (ref 5.0–8.0)

## 2024-05-31 LAB — CBC WITH DIFFERENTIAL/PLATELET
Abs Immature Granulocytes: 0.04 K/uL (ref 0.00–0.07)
Basophils Absolute: 0 K/uL (ref 0.0–0.1)
Basophils Relative: 0 %
Eosinophils Absolute: 0.3 K/uL (ref 0.0–0.5)
Eosinophils Relative: 3 %
HCT: 26.4 % — ABNORMAL LOW (ref 36.0–46.0)
Hemoglobin: 7.9 g/dL — ABNORMAL LOW (ref 12.0–15.0)
Immature Granulocytes: 1 %
Lymphocytes Relative: 24 %
Lymphs Abs: 1.8 K/uL (ref 0.7–4.0)
MCH: 24.3 pg — ABNORMAL LOW (ref 26.0–34.0)
MCHC: 29.9 g/dL — ABNORMAL LOW (ref 30.0–36.0)
MCV: 81.2 fL (ref 80.0–100.0)
Monocytes Absolute: 0.9 K/uL (ref 0.1–1.0)
Monocytes Relative: 11 %
Neutro Abs: 4.8 K/uL (ref 1.7–7.7)
Neutrophils Relative %: 61 %
Platelets: 310 K/uL (ref 150–400)
RBC: 3.25 MIL/uL — ABNORMAL LOW (ref 3.87–5.11)
RDW: 18.6 % — ABNORMAL HIGH (ref 11.5–15.5)
WBC: 7.8 K/uL (ref 4.0–10.5)
nRBC: 0 % (ref 0.0–0.2)

## 2024-05-31 LAB — BLOOD GAS, VENOUS
Acid-Base Excess: 2.5 mmol/L — ABNORMAL HIGH (ref 0.0–2.0)
Bicarbonate: 27.9 mmol/L (ref 20.0–28.0)
Drawn by: 52113
O2 Saturation: 32.6 %
Patient temperature: 36.7
pCO2, Ven: 44 mmHg (ref 44–60)
pH, Ven: 7.4 (ref 7.25–7.43)
pO2, Ven: <31 mmHg — CL (ref 32–45)

## 2024-05-31 LAB — CBG MONITORING, ED: Glucose-Capillary: 454 mg/dL — ABNORMAL HIGH (ref 70–99)

## 2024-05-31 LAB — COMPREHENSIVE METABOLIC PANEL WITH GFR
ALT: 7 U/L (ref 0–44)
AST: 11 U/L — ABNORMAL LOW (ref 15–41)
Albumin: 3 g/dL — ABNORMAL LOW (ref 3.5–5.0)
Alkaline Phosphatase: 174 U/L — ABNORMAL HIGH (ref 38–126)
Anion gap: 12 (ref 5–15)
BUN: 18 mg/dL (ref 8–23)
CO2: 25 mmol/L (ref 22–32)
Calcium: 8.7 mg/dL — ABNORMAL LOW (ref 8.9–10.3)
Chloride: 95 mmol/L — ABNORMAL LOW (ref 98–111)
Creatinine, Ser: 1.29 mg/dL — ABNORMAL HIGH (ref 0.44–1.00)
GFR, Estimated: 45 mL/min — ABNORMAL LOW (ref >60)
Glucose, Bld: 470 mg/dL — ABNORMAL HIGH (ref 70–99)
Potassium: 5 mmol/L (ref 3.5–5.1)
Sodium: 132 mmol/L — ABNORMAL LOW (ref 135–145)
Total Bilirubin: 0.4 mg/dL (ref 0.0–1.2)
Total Protein: 7 g/dL (ref 6.5–8.1)

## 2024-05-31 MED ORDER — SODIUM CHLORIDE 0.9 % IV SOLN
1.0000 g | Freq: Once | INTRAVENOUS | Status: AC
  Administered 2024-05-31: 1 g via INTRAVENOUS
  Filled 2024-05-31: qty 10

## 2024-05-31 MED ORDER — SODIUM CHLORIDE 0.9 % IV BOLUS
1000.0000 mL | Freq: Once | INTRAVENOUS | Status: AC
  Administered 2024-05-31: 1000 mL via INTRAVENOUS

## 2024-05-31 MED ORDER — CEPHALEXIN 500 MG PO CAPS: 500.0000 mg | ORAL_CAPSULE | Freq: Four times a day (QID) | ORAL | 0 refills | Status: DC

## 2024-05-31 NOTE — ED Provider Notes (Signed)
 Signout from Dr. Patsey.  68 year old female with general weakness elevated glucose lower abdominal discomfort.  Awaiting urinalysis.  Treat as indicated.  Consider CT if urinalysis negative. Physical Exam  BP (!) 123/59   Pulse 90   Temp 98 F (36.7 C) (Oral)   Resp 13   Ht 5' 6 (1.676 m)   Wt 79.4 kg   SpO2 99%   BMI 28.25 kg/m   Physical Exam  Procedures  Procedures  ED Course / MDM    Medical Decision Making Amount and/or Complexity of Data Reviewed Labs: ordered. Radiology: ordered.   Patient's urinalysis consistent with infection.  She was given a gram of IV ceftriaxone .  She is comfortable plan for discharge and outpatient follow-up.  She said she had some right hip pain from a recent fall so she wanted an x-ray of that was negative.         Amy Ozell BROCKS, MD 05/31/24 [phone]

## 2024-05-31 NOTE — Discharge Instructions (Signed)
 You were seen in the emergency department for weakness lower abdominal pain.  Your blood sugar was elevated and your urinalysis showed signs of infection.  Please finish your antibiotics and keep well-hydrated.  Follow-up with your primary care doctor.  Return to the emergency department if any worsening or concerning symptoms

## 2024-05-31 NOTE — ED Notes (Signed)
 pt says that she fell on her hip a few weeks ago and it still hurts. Pt was wondering if she could get an xray of it. MD Towana aware.

## 2024-05-31 NOTE — ED Triage Notes (Signed)
 Pt BIB [facility] with complaints of weakness and dizziness. Patient is diabetic and does not know the last time she took her insulin  or even checked her cbg. Cbg in route was [phone].

## 2024-05-31 NOTE — ED Provider Notes (Signed)
 [place] EMERGENCY DEPARTMENT AT [facility] Provider Note   CSN: [id number] Arrival date & time: 05/31/24  1257     Patient presents with: Weakness   Amy Livingston is a 68 y.o. female.    Weakness Patient presents with weakness.  Thinks that she may have a UTI.  Feeling weak.  Also dysuria.  States she has had multiple UTIs and is currently on antibiotics.  Does have some pelvic pain lower abdominal pain that is not unusual for her.  Sugars have also been high.  States last checked her sugar 2 days ago.  Also had insulin  2 days ago.     Prior to Admission medications   Medication Sig Start Date End Date Taking? Authorizing Provider  acetaminophen  (TYLENOL ) 325 MG tablet Take 2 tablets (650 mg total) by mouth every 6 (six) hours as needed for mild pain (pain score 1-3) or fever (or Fever >/= 101). 03/15/24   Emokpae, Courage, MD  Ascorbic Acid (VITAMIN C ) 100 MG tablet Take 1 tablet (100 mg total) by mouth daily. 04/02/24   Guadlupe Lianne DASEN, MD  atorvastatin  (LIPITOR) 80 MG tablet TAKE 1 TABLET(80 MG) BY MOUTH DAILY 02/28/24   Bacchus, Gloria Z, FNP  busPIRone  (BUSPAR ) 10 MG tablet Take 1 tablet (10 mg total) by mouth 2 (two) times daily. 06/16/23   Bacchus, Meade PEDLAR, FNP  cyclobenzaprine  (FLEXERIL ) 10 MG tablet Take 10 mg by mouth every 12 (twelve) hours.    [provider]  ELIQUIS  5 MG TABS tablet Take 5 mg by mouth 2 (two) times daily.    [provider]  estradiol  (ESTRACE ) 0.1 MG/GM vaginal cream Place 0.5 g vaginally 2 (two) times a week. Place 0.5g nightly for two weeks then twice a week after 04/05/24   Guadlupe Lianne T, MD  famotidine  (PEPCID ) 20 MG tablet Take 1 tablet (20 mg total) by mouth daily. 01/27/24   Bacchus, Gloria Z, FNP  feeding supplement (ENSURE PLUS HIGH PROTEIN) LIQD Take 237 mLs by mouth 2 (two) times daily between meals for 14 days. 05/17/24 05/31/24  Pokhrel, Laxman, MD  gabapentin  (NEURONTIN ) 300 MG capsule Take 1 capsule (300 mg total) by  mouth 3 (three) times daily. 04/12/24   Bacchus, Meade PEDLAR, FNP  glucose blood (ACCU-CHEK GUIDE TEST) test strip USE TO TEST BLOOD SUGAR EVERY MORNING, AT NOON, AND EVERY NIGHT AT BEDTIME. 12/06/23   Bacchus, Gloria Z, FNP  guaiFENesin (ROBITUSSIN) 100 MG/5ML liquid Take 5 mLs by mouth every 4 (four) hours as needed for cough or to loosen phlegm. 05/17/24   Pokhrel, Laxman, MD  insulin  isophane & regular human KwikPen (NOVOLIN  70/30 KWIKPEN) (70-30) 100 UNIT/ML KwikPen Inject 30 Units into the skin 2 (two) times daily with a meal. 03/22/24   Bacchus, Meade PEDLAR, FNP  meclizine  (ANTIVERT ) 25 MG tablet Take 1 tablet (25 mg total) by mouth daily as needed for dizziness. 03/22/24   Bacchus, Meade PEDLAR, FNP  mirabegron  ER (MYRBETRIQ ) 25 MG TB24 tablet Take 1 tablet (25 mg total) by mouth at bedtime. 11/06/23   Vicci Afton CROME, MD  mometasone  (ELOCON ) 0.1 % cream Apply topically daily as needed for itch 02/15/24   Karis Clunes, MD  Multiple Vitamin (MULTIVITAMIN WITH MINERALS) TABS tablet Take 1 tablet by mouth daily. 05/18/24   Pokhrel, Laxman, MD  ondansetron  (ZOFRAN ) 4 MG tablet Take 1 tablet (4 mg total) by mouth every 6 (six) hours as needed for nausea. 10/14/23   Pearlean Manus, MD  oxyCODONE -acetaminophen  (PERCOCET)  10-325 MG tablet Take 1 tablet by mouth every 8 (eight) hours as needed for pain.    [provider]  saccharomyces boulardii (FLORASTOR) 250 MG capsule Take 1 capsule (250 mg total) by mouth 2 (two) times daily for 14 days. 05/17/24 05/31/24  Pokhrel, Laxman, MD  sertraline  (ZOLOFT ) 50 MG tablet TAKE 1 TABLET(50 MG) BY MOUTH DAILY 05/31/24   Bacchus, Gloria Z, FNP  sucralfate  (CARAFATE ) 1 g tablet Take 1 tablet (1 g total) by mouth 4 (four) times daily -  with meals and at bedtime. 01/27/24   Bacchus, Meade PEDLAR, FNP  tirzepatide  (MOUNJARO ) 15 MG/0.5ML Pen Inject 15 mg into the skin once a week. 03/22/24   Bacchus, Meade PEDLAR, FNP  traZODone  (DESYREL ) 100 MG tablet Take 1 tablet (100 mg total) by  mouth at bedtime. 03/22/24   Bacchus, Meade PEDLAR, FNP    Allergies: Elemental sulfur and Metformin  and related    Review of Systems  Neurological:  Positive for weakness.    Updated Vital Signs BP 129/64   Pulse 92   Temp 98 F (36.7 C) (Oral)   Resp 15   Ht 5' 6 (1.676 m)   Wt 79.4 kg   SpO2 97%   BMI 28.25 kg/m   Physical Exam Vitals and nursing note reviewed.  Cardiovascular:     Rate and Rhythm: Regular rhythm.  Pulmonary:     Breath sounds: No wheezing or rhonchi.  Abdominal:     Tenderness: There is abdominal tenderness.     Comments: Lower abdominal tenderness without rebound or guarding.  No hernia palpated.  Skin:    General: Skin is warm.  Neurological:     Mental Status: She is alert and oriented to person, place, and time.     (all labs ordered are listed, but only abnormal results are displayed) Labs Reviewed  COMPREHENSIVE METABOLIC PANEL WITH GFR - Abnormal; Notable for the following components:      Result Value   Sodium 132 (*)    Chloride 95 (*)    Glucose, Bld 470 (*)    Creatinine, Ser 1.29 (*)    Calcium  8.7 (*)    Albumin 3.0 (*)    AST 11 (*)    Alkaline Phosphatase 174 (*)    GFR, Estimated 45 (*)    All other components within normal limits  CBC WITH DIFFERENTIAL/PLATELET - Abnormal; Notable for the following components:   RBC 3.25 (*)    Hemoglobin 7.9 (*)    HCT 26.4 (*)    MCH 24.3 (*)    MCHC 29.9 (*)    RDW 18.6 (*)    All other components within normal limits  BLOOD GAS, VENOUS - Abnormal; Notable for the following components:   pO2, Ven <31 (*)    Acid-Base Excess 2.5 (*)    All other components within normal limits  CBG MONITORING, ED - Abnormal; Notable for the following components:   Glucose-Capillary 454 (*)    All other components within normal limits  URINALYSIS, W/ REFLEX TO CULTURE (INFECTION SUSPECTED)    EKG: None  Radiology: No results found.   Procedures   Medications Ordered in the ED  sodium  chloride 0.9 % bolus 1,000 mL (1,000 mLs Intravenous Bolus 05/31/24 1401)                                    Medical Decision Making Amount and/or  Complexity of Data Reviewed Labs: ordered.   Patient with hyperglycemia.  Dysuria.  Generalized weakness.  At EMS was 570.  Over 400 here.  Differential diagnosis does include UTI, DKA, other infections.  Will get further blood work and urinalysis.  Reviewed recent discharge note.  Reviewed CT scans.  Patient states she is still on antibiotics.  Care will be turned over to Dr. Towana.     Final diagnoses:  None    ED Discharge Orders     None          Patsey Lot, MD 05/31/24 [phone]

## 2024-06-02 LAB — URINE CULTURE: Culture: >=100000 — AB

## 2024-06-03 ENCOUNTER — Telehealth (HOSPITAL_BASED_OUTPATIENT_CLINIC_OR_DEPARTMENT_OTHER): Payer: Self-pay | Admitting: *Deleted

## 2024-06-03 NOTE — Telephone Encounter (Signed)
 Post ED Visit - Positive Culture Follow-up: Unsuccessful Patient Follow-up  Culture assessed and recommendations reviewed by:  [x]  Dorn Buttner, Pharm.D. []  Venetia Gully, Pharm.D., BCPS AQ-ID []  Garrel Crews, Pharm.D., BCPS []  Almarie Lunger, Pharm.D., BCPS []  [place], [address].D., BCPS, AAHIVP []  Rosaline Bihari, Pharm.D., BCPS, AAHIVP []  Massie Rigg, PharmD []  Jodie Rower, PharmD, BCPS  Positive urine culture   Plan: symptom check.  If pt is better/resolved- give return precautions and continue Keflex . If worse/ not improved- Fosfomycin 3g every 3 days for 3 doses (qty 9) per Prentice Medicus, MD.  Return precautions important  Unable to contact patient  letter will be sent to address on file  Amy Livingston 06/03/2024, 4:12 PM

## 2024-06-03 NOTE — Progress Notes (Addendum)
 ED Antimicrobial Stewardship Positive Culture Follow Up   Amy Livingston is an 67 y.o. female who presented to [facility] on @ADMITDT @ with a chief complaint of  Chief Complaint  Patient presents with   Weakness    Recent Results (from the past 720 hours)  C Difficile Quick Screen w PCR reflex     Status: Abnormal   Collection Time: 05/14/24  3:05 PM   Specimen: Urine, Catheterized; Stool  Result Value Ref Range Status   C Diff antigen POSITIVE (A) NEGATIVE Final   C Diff toxin NEGATIVE NEGATIVE Final   C Diff interpretation Results are indeterminate. See PCR results.  Final    Comment: Performed at [facility], [address], [place], [place] [address]  Gastrointestinal Panel by PCR , Stool     Status: None   Collection Time: 05/14/24  3:05 PM   Specimen: Urine, Catheterized; Stool  Result Value Ref Range Status   Campylobacter species NOT DETECTED NOT DETECTED Final   Plesimonas shigelloides NOT DETECTED NOT DETECTED Final   Salmonella species NOT DETECTED NOT DETECTED Final   Yersinia enterocolitica NOT DETECTED NOT DETECTED Final   Vibrio species NOT DETECTED NOT DETECTED Final   Vibrio cholerae NOT DETECTED NOT DETECTED Final   Enteroaggregative E coli (EAEC) NOT DETECTED NOT DETECTED Final   Enteropathogenic E coli (EPEC) NOT DETECTED NOT DETECTED Final   Enterotoxigenic E coli (ETEC) NOT DETECTED NOT DETECTED Final   Shiga like toxin producing E coli (STEC) NOT DETECTED NOT DETECTED Final   Shigella/Enteroinvasive E coli (EIEC) NOT DETECTED NOT DETECTED Final   Cryptosporidium NOT DETECTED NOT DETECTED Final   Cyclospora cayetanensis NOT DETECTED NOT DETECTED Final   Entamoeba histolytica NOT DETECTED NOT DETECTED Final   Giardia lamblia NOT DETECTED NOT DETECTED Final   Adenovirus F40/41 NOT DETECTED NOT DETECTED Final   Astrovirus NOT DETECTED NOT DETECTED Final   Norovirus GI/GII NOT DETECTED NOT DETECTED Final   Rotavirus A NOT DETECTED NOT DETECTED Final    Sapovirus (I, II, IV, and V) NOT DETECTED NOT DETECTED Final    Comment: Performed at [facility], [address] [place], [place] [address]  C. Diff by PCR, Reflexed     Status: Abnormal   Collection Time: 05/14/24  3:05 PM  Result Value Ref Range Status   Toxigenic C. Difficile by PCR POSITIVE (A) NEGATIVE Final    Comment: Positive for toxigenic C. difficile with little to no toxin production. Only treat if clinical presentation suggests symptomatic illness.   Hypervirulent Strain PRESUMPTIVE NEGATIVE PRESUMPTIVE NEGATIVE Final    Comment: Performed at [facility], [address] [place], [place] [address]  Urine Culture     Status: Abnormal   Collection Time: 05/14/24  3:05 PM   Specimen: Urine, Clean Catch  Result Value Ref Range Status   Specimen Description   Final    URINE, CLEAN CATCH Performed at [facility], [address], [place], [place] [address]    Special Requests   Final    NONE Performed at [facility], [address] [place], [place] [address]    Culture >=100,000 COLONIES/mL KLEBSIELLA PNEUMONIAE (A)  Final   Report Status 05/16/2024 FINAL  Final   Organism ID, Bacteria KLEBSIELLA PNEUMONIAE (A)  Final      Susceptibility   Klebsiella pneumoniae - MIC*    AMPICILLIN RESISTANT Resistant     CEFAZOLIN  (URINE) Value in next row Sensitive      2 SENSITIVEThis is  a modified FDA-approved test that has been validated and its performance characteristics determined by the reporting laboratory.  This laboratory is certified under the Clinical Laboratory Improvement Amendments CLIA as qualified to perform high complexity clinical laboratory testing.    CEFEPIME  Value in next row Sensitive      2 SENSITIVEThis is a modified FDA-approved test that has been validated and its performance characteristics determined by the reporting laboratory.  This laboratory is certified under the Clinical Laboratory Improvement Amendments CLIA as qualified to perform high  complexity clinical laboratory testing.    ERTAPENEM  Value in next row Sensitive      2 SENSITIVEThis is a modified FDA-approved test that has been validated and its performance characteristics determined by the reporting laboratory.  This laboratory is certified under the Clinical Laboratory Improvement Amendments CLIA as qualified to perform high complexity clinical laboratory testing.    CEFTRIAXONE  Value in next row Sensitive      2 SENSITIVEThis is a modified FDA-approved test that has been validated and its performance characteristics determined by the reporting laboratory.  This laboratory is certified under the Clinical Laboratory Improvement Amendments CLIA as qualified to perform high complexity clinical laboratory testing.    CIPROFLOXACIN Value in next row Sensitive      2 SENSITIVEThis is a modified FDA-approved test that has been validated and its performance characteristics determined by the reporting laboratory.  This laboratory is certified under the Clinical Laboratory Improvement Amendments CLIA as qualified to perform high complexity clinical laboratory testing.    GENTAMICIN Value in next row Sensitive      2 SENSITIVEThis is a modified FDA-approved test that has been validated and its performance characteristics determined by the reporting laboratory.  This laboratory is certified under the Clinical Laboratory Improvement Amendments CLIA as qualified to perform high complexity clinical laboratory testing.    NITROFURANTOIN  Value in next row Resistant      2 SENSITIVEThis is a modified FDA-approved test that has been validated and its performance characteristics determined by the reporting laboratory.  This laboratory is certified under the Clinical Laboratory Improvement Amendments CLIA as qualified to perform high complexity clinical laboratory testing.    TRIMETH/SULFA Value in next row Sensitive      2 SENSITIVEThis is a modified FDA-approved test that has been validated and its  performance characteristics determined by the reporting laboratory.  This laboratory is certified under the Clinical Laboratory Improvement Amendments CLIA as qualified to perform high complexity clinical laboratory testing.    AMPICILLIN/SULBACTAM Value in next row Sensitive      2 SENSITIVEThis is a modified FDA-approved test that has been validated and its performance characteristics determined by the reporting laboratory.  This laboratory is certified under the Clinical Laboratory Improvement Amendments CLIA as qualified to perform high complexity clinical laboratory testing.    PIP/TAZO Value in next row Sensitive      <=4 SENSITIVEThis is a modified FDA-approved test that has been validated and its performance characteristics determined by the reporting laboratory.  This laboratory is certified under the Clinical Laboratory Improvement Amendments CLIA as qualified to perform high complexity clinical laboratory testing.    MEROPENEM  Value in next row Sensitive      <=4 SENSITIVEThis is a modified FDA-approved test that has been validated and its performance characteristics determined by the reporting laboratory.  This laboratory is certified under the Clinical Laboratory Improvement Amendments CLIA as qualified to perform high complexity clinical laboratory testing.    * >=100,000 COLONIES/mL KLEBSIELLA PNEUMONIAE  MRSA  Next Gen by PCR, Nasal     Status: None   Collection Time: 05/14/24  9:33 PM   Specimen: Nasal Mucosa; Nasal Swab  Result Value Ref Range Status   MRSA by PCR Next Gen NOT DETECTED NOT DETECTED Final    Comment: (NOTE) The GeneXpert MRSA Assay (FDA approved for NASAL specimens only), is one component of a comprehensive MRSA colonization surveillance program. It is not intended to diagnose MRSA infection nor to guide or monitor treatment for MRSA infections. Test performance is not FDA approved in patients less than 19 years old. Performed at [facility], [address],  [place], [place] [address]   Urine Culture     Status: Abnormal   Collection Time: 05/31/24  4:32 PM   Specimen: Urine, Random  Result Value Ref Range Status   Specimen Description   Final    URINE, RANDOM Performed at [facility], [address] [place], [place] [address]    Special Requests   Final    NONE Reflexed from [id number] Performed at [facility], [address] [place], [place] [address]    Culture (A)  Final    >=100,000 COLONIES/mL KLEBSIELLA PNEUMONIAE Confirmed Extended Spectrum Beta-Lactamase Producer (ESBL).  In bloodstream infections from ESBL organisms, carbapenems are preferred over piperacillin /tazobactam. They are shown to have a lower risk of mortality.    Report Status 06/02/2024 FINAL  Final   Organism ID, Bacteria KLEBSIELLA PNEUMONIAE (A)  Final      Susceptibility   Klebsiella pneumoniae - MIC*    AMPICILLIN >=32 RESISTANT Resistant     CEFAZOLIN  (URINE) Value in next row Resistant      >=32 RESISTANTThis is a modified FDA-approved test that has been validated and its performance characteristics determined by the reporting laboratory.  This laboratory is certified under the Clinical Laboratory Improvement Amendments CLIA as qualified to perform high complexity clinical laboratory testing.    CEFEPIME  Value in next row Resistant      >=32 RESISTANTThis is a modified FDA-approved test that has been validated and its performance characteristics determined by the reporting laboratory.  This laboratory is certified under the Clinical Laboratory Improvement Amendments CLIA as qualified to perform high complexity clinical laboratory testing.    ERTAPENEM  Value in next row Sensitive      >=32 RESISTANTThis is a modified FDA-approved test that has been validated and its performance characteristics determined by the reporting laboratory.  This laboratory is certified under the Clinical Laboratory Improvement Amendments CLIA as qualified to perform high complexity clinical  laboratory testing.    CEFTRIAXONE  Value in next row Resistant      >=32 RESISTANTThis is a modified FDA-approved test that has been validated and its performance characteristics determined by the reporting laboratory.  This laboratory is certified under the Clinical Laboratory Improvement Amendments CLIA as qualified to perform high complexity clinical laboratory testing.    CIPROFLOXACIN Value in next row Resistant      >=32 RESISTANTThis is a modified FDA-approved test that has been validated and its performance characteristics determined by the reporting laboratory.  This laboratory is certified under the Clinical Laboratory Improvement Amendments CLIA as qualified to perform high complexity clinical laboratory testing.    GENTAMICIN Value in next row Resistant      >=32 RESISTANTThis is a modified FDA-approved test that has been validated and its performance characteristics determined by the reporting laboratory.  This laboratory is certified under the Clinical Laboratory Improvement Amendments CLIA as qualified to perform high complexity clinical  laboratory testing.    NITROFURANTOIN  Value in next row Resistant      >=32 RESISTANTThis is a modified FDA-approved test that has been validated and its performance characteristics determined by the reporting laboratory.  This laboratory is certified under the Clinical Laboratory Improvement Amendments CLIA as qualified to perform high complexity clinical laboratory testing.    TRIMETH/SULFA Value in next row Resistant      >=32 RESISTANTThis is a modified FDA-approved test that has been validated and its performance characteristics determined by the reporting laboratory.  This laboratory is certified under the Clinical Laboratory Improvement Amendments CLIA as qualified to perform high complexity clinical laboratory testing.    AMPICILLIN/SULBACTAM Value in next row Resistant      >=32 RESISTANTThis is a modified FDA-approved test that has been validated  and its performance characteristics determined by the reporting laboratory.  This laboratory is certified under the Clinical Laboratory Improvement Amendments CLIA as qualified to perform high complexity clinical laboratory testing.    PIP/TAZO Value in next row Sensitive      16 SENSITIVEThis is a modified FDA-approved test that has been validated and its performance characteristics determined by the reporting laboratory.  This laboratory is certified under the Clinical Laboratory Improvement Amendments CLIA as qualified to perform high complexity clinical laboratory testing.    MEROPENEM  Value in next row Sensitive      16 SENSITIVEThis is a modified FDA-approved test that has been validated and its performance characteristics determined by the reporting laboratory.  This laboratory is certified under the Clinical Laboratory Improvement Amendments CLIA as qualified to perform high complexity clinical laboratory testing.    * >=100,000 COLONIES/mL KLEBSIELLA PNEUMONIAE    [x]  Treated with Keflex , organism resistant to prescribed antimicrobial  Dr. Ula is requesting symptom check.  Symptoms have not improved or worsened > fosfomycin as below w/ return precautions (we will not know if organism is sensitive to fosfomycin) Symptoms have improved > Resume Keflex  and give return precautions  New antibiotic prescription: Fosfomycin 3g orally every 3 days for 3 doses (Qty 9g; Refills 0)  ED Provider: Prentice Ula MD   Dorn Buttner, PharmD, BCPS 06/03/2024 11:20 AM ED Clinical Pharmacist -  [phone]

## 2024-06-05 ENCOUNTER — Encounter (HOSPITAL_COMMUNITY): Payer: Self-pay | Admitting: Emergency Medicine

## 2024-06-05 ENCOUNTER — Inpatient Hospital Stay (HOSPITAL_COMMUNITY)
Admission: EM | Admit: 2024-06-05 | Discharge: 2024-06-08 | DRG: 690 | Disposition: A | Discharge status: Skilled Nursing Facility | Attending: Hospitalist | Admitting: Hospitalist

## 2024-06-05 ENCOUNTER — Emergency Department (HOSPITAL_COMMUNITY)

## 2024-06-05 ENCOUNTER — Other Ambulatory Visit: Payer: Self-pay

## 2024-06-05 DIAGNOSIS — Z1624 Resistance to multiple antibiotics: Secondary | ICD-10-CM | POA: Diagnosis present

## 2024-06-05 DIAGNOSIS — B9689 Other specified bacterial agents as the cause of diseases classified elsewhere: Secondary | ICD-10-CM | POA: Diagnosis present

## 2024-06-05 DIAGNOSIS — Z7985 Long-term (current) use of injectable non-insulin antidiabetic drugs: Secondary | ICD-10-CM | POA: Diagnosis not present

## 2024-06-05 DIAGNOSIS — G8929 Other chronic pain: Secondary | ICD-10-CM | POA: Diagnosis present

## 2024-06-05 DIAGNOSIS — I480 Paroxysmal atrial fibrillation: Secondary | ICD-10-CM | POA: Diagnosis present

## 2024-06-05 DIAGNOSIS — N3281 Overactive bladder: Secondary | ICD-10-CM | POA: Diagnosis present

## 2024-06-05 DIAGNOSIS — N390 Urinary tract infection, site not specified: Secondary | ICD-10-CM | POA: Diagnosis not present

## 2024-06-05 DIAGNOSIS — R531 Weakness: Principal | ICD-10-CM

## 2024-06-05 DIAGNOSIS — Z794 Long term (current) use of insulin: Secondary | ICD-10-CM

## 2024-06-05 DIAGNOSIS — E871 Hypo-osmolality and hyponatremia: Secondary | ICD-10-CM | POA: Diagnosis present

## 2024-06-05 DIAGNOSIS — Z79899 Other long term (current) drug therapy: Secondary | ICD-10-CM | POA: Diagnosis not present

## 2024-06-05 DIAGNOSIS — E1165 Type 2 diabetes mellitus with hyperglycemia: Secondary | ICD-10-CM | POA: Diagnosis present

## 2024-06-05 DIAGNOSIS — F419 Anxiety disorder, unspecified: Secondary | ICD-10-CM | POA: Diagnosis present

## 2024-06-05 DIAGNOSIS — L8990 Pressure ulcer of unspecified site, unspecified stage: Secondary | ICD-10-CM | POA: Insufficient documentation

## 2024-06-05 DIAGNOSIS — B961 Klebsiella pneumoniae [K. pneumoniae] as the cause of diseases classified elsewhere: Secondary | ICD-10-CM | POA: Diagnosis present

## 2024-06-05 DIAGNOSIS — Z87891 Personal history of nicotine dependence: Secondary | ICD-10-CM

## 2024-06-05 DIAGNOSIS — R739 Hyperglycemia, unspecified: Secondary | ICD-10-CM

## 2024-06-05 DIAGNOSIS — Z888 Allergy status to other drugs, medicaments and biological substances status: Secondary | ICD-10-CM | POA: Diagnosis not present

## 2024-06-05 DIAGNOSIS — I10 Essential (primary) hypertension: Secondary | ICD-10-CM | POA: Diagnosis present

## 2024-06-05 DIAGNOSIS — D638 Anemia in other chronic diseases classified elsewhere: Secondary | ICD-10-CM | POA: Diagnosis present

## 2024-06-05 DIAGNOSIS — Z87892 Personal history of anaphylaxis: Secondary | ICD-10-CM

## 2024-06-05 DIAGNOSIS — N320 Bladder-neck obstruction: Secondary | ICD-10-CM | POA: Diagnosis present

## 2024-06-05 DIAGNOSIS — N136 Pyonephrosis: Secondary | ICD-10-CM | POA: Diagnosis present

## 2024-06-05 DIAGNOSIS — G629 Polyneuropathy, unspecified: Secondary | ICD-10-CM

## 2024-06-05 DIAGNOSIS — G909 Disorder of the autonomic nervous system, unspecified: Secondary | ICD-10-CM | POA: Diagnosis present

## 2024-06-05 DIAGNOSIS — N3289 Other specified disorders of bladder: Secondary | ICD-10-CM | POA: Diagnosis present

## 2024-06-05 DIAGNOSIS — Z91030 Bee allergy status: Secondary | ICD-10-CM

## 2024-06-05 DIAGNOSIS — Z7901 Long term (current) use of anticoagulants: Secondary | ICD-10-CM

## 2024-06-05 DIAGNOSIS — Z79890 Hormone replacement therapy (postmenopausal): Secondary | ICD-10-CM

## 2024-06-05 DIAGNOSIS — E1140 Type 2 diabetes mellitus with diabetic neuropathy, unspecified: Secondary | ICD-10-CM | POA: Diagnosis present

## 2024-06-05 DIAGNOSIS — Z90710 Acquired absence of both cervix and uterus: Secondary | ICD-10-CM

## 2024-06-05 DIAGNOSIS — Z8619 Personal history of other infectious and parasitic diseases: Secondary | ICD-10-CM

## 2024-06-05 DIAGNOSIS — N10 Acute tubulo-interstitial nephritis: Secondary | ICD-10-CM

## 2024-06-05 DIAGNOSIS — L8991 Pressure ulcer of unspecified site, stage 1: Secondary | ICD-10-CM | POA: Diagnosis present

## 2024-06-05 DIAGNOSIS — Z87440 Personal history of urinary (tract) infections: Secondary | ICD-10-CM

## 2024-06-05 DIAGNOSIS — Z981 Arthrodesis status: Secondary | ICD-10-CM

## 2024-06-05 DIAGNOSIS — R296 Repeated falls: Secondary | ICD-10-CM | POA: Diagnosis present

## 2024-06-05 LAB — CBC WITH DIFFERENTIAL/PLATELET
Abs Immature Granulocytes: 0.05 K/uL (ref 0.00–0.07)
Basophils Absolute: 0 K/uL (ref 0.0–0.1)
Basophils Relative: 0 %
Eosinophils Absolute: 0.2 K/uL (ref 0.0–0.5)
Eosinophils Relative: 3 %
HCT: 23.9 % — ABNORMAL LOW (ref 36.0–46.0)
Hemoglobin: 7.2 g/dL — ABNORMAL LOW (ref 12.0–15.0)
Immature Granulocytes: 1 %
Lymphocytes Relative: 18 %
Lymphs Abs: 1.6 K/uL (ref 0.7–4.0)
MCH: 24.7 pg — ABNORMAL LOW (ref 26.0–34.0)
MCHC: 30.1 g/dL (ref 30.0–36.0)
MCV: 82.1 fL (ref 80.0–100.0)
Monocytes Absolute: 0.8 K/uL (ref 0.1–1.0)
Monocytes Relative: 9 %
Neutro Abs: 6.2 K/uL (ref 1.7–7.7)
Neutrophils Relative %: 69 %
Platelets: 340 K/uL (ref 150–400)
RBC: 2.91 MIL/uL — ABNORMAL LOW (ref 3.87–5.11)
RDW: 18.9 % — ABNORMAL HIGH (ref 11.5–15.5)
WBC: 8.9 K/uL (ref 4.0–10.5)
nRBC: 0 % (ref 0.0–0.2)

## 2024-06-05 LAB — URINALYSIS, ROUTINE W REFLEX MICROSCOPIC
Bilirubin Urine: NEGATIVE
Glucose, UA: >=500 mg/dL — AB
Ketones, ur: NEGATIVE mg/dL
Nitrite: POSITIVE — AB
Protein, ur: 100 mg/dL — AB
RBC / HPF: >50 RBC/hpf (ref 0–5)
Specific Gravity, Urine: 1.008 (ref 1.005–1.030)
WBC, UA: >50 WBC/hpf (ref 0–5)
pH: 7 (ref 5.0–8.0)

## 2024-06-05 LAB — COMPREHENSIVE METABOLIC PANEL WITH GFR
ALT: <5 U/L (ref 0–44)
AST: <10 U/L — ABNORMAL LOW (ref 15–41)
Albumin: 2.9 g/dL — ABNORMAL LOW (ref 3.5–5.0)
Alkaline Phosphatase: 151 U/L — ABNORMAL HIGH (ref 38–126)
Anion gap: 9 (ref 5–15)
BUN: 13 mg/dL (ref 8–23)
CO2: 26 mmol/L (ref 22–32)
Calcium: 9.4 mg/dL (ref 8.9–10.3)
Chloride: 95 mmol/L — ABNORMAL LOW (ref 98–111)
Creatinine, Ser: 1.21 mg/dL — ABNORMAL HIGH (ref 0.44–1.00)
GFR, Estimated: 49 mL/min — ABNORMAL LOW (ref >60)
Glucose, Bld: 456 mg/dL — ABNORMAL HIGH (ref 70–99)
Potassium: 4.3 mmol/L (ref 3.5–5.1)
Sodium: 130 mmol/L — ABNORMAL LOW (ref 135–145)
Total Bilirubin: 0.3 mg/dL (ref 0.0–1.2)
Total Protein: 6.5 g/dL (ref 6.5–8.1)

## 2024-06-05 LAB — CBG MONITORING, ED: Glucose-Capillary: 385 mg/dL — ABNORMAL HIGH (ref 70–99)

## 2024-06-05 LAB — GLUCOSE, CAPILLARY: Glucose-Capillary: 334 mg/dL — ABNORMAL HIGH (ref 70–99)

## 2024-06-05 LAB — LACTIC ACID, PLASMA: Lactic Acid, Venous: 1.1 mmol/L (ref 0.5–1.9)

## 2024-06-05 LAB — MAGNESIUM: Magnesium: 1.5 mg/dL — ABNORMAL LOW (ref 1.7–2.4)

## 2024-06-05 MED ORDER — LORAZEPAM 2 MG/ML IJ SOLN
0.5000 mg | Freq: Once | INTRAMUSCULAR | Status: AC
  Administered 2024-06-06: 0.5 mg via INTRAVENOUS
  Filled 2024-06-05: qty 1

## 2024-06-05 MED ORDER — SODIUM CHLORIDE 0.9 % IV SOLN
1.0000 g | Freq: Two times a day (BID) | INTRAVENOUS | Status: DC
  Administered 2024-06-05 – 2024-06-06 (×2): 1 g via INTRAVENOUS
  Filled 2024-06-05 (×2): qty 20

## 2024-06-05 MED ORDER — SODIUM CHLORIDE 0.9 % IV BOLUS
1000.0000 mL | Freq: Once | INTRAVENOUS | Status: AC
  Administered 2024-06-05: 1000 mL via INTRAVENOUS

## 2024-06-05 MED ORDER — ACETAMINOPHEN 650 MG RE SUPP: 650.0000 mg | Freq: Four times a day (QID) | RECTAL | Status: DC | PRN

## 2024-06-05 MED ORDER — TRAZODONE HCL 50 MG PO TABS
100.0000 mg | ORAL_TABLET | Freq: Every day | ORAL | Status: DC
  Administered 2024-06-05 – 2024-06-07 (×3): 100 mg via ORAL
  Filled 2024-06-05 (×3): qty 2

## 2024-06-05 MED ORDER — OXYCODONE HCL 5 MG PO TABS
5.0000 mg | ORAL_TABLET | Freq: Three times a day (TID) | ORAL | Status: DC | PRN
  Administered 2024-06-06 – 2024-06-08 (×6): 5 mg via ORAL
  Filled 2024-06-05 (×6): qty 1

## 2024-06-05 MED ORDER — INSULIN ASPART 100 UNIT/ML IJ SOLN
0.0000 [IU] | Freq: Three times a day (TID) | INTRAMUSCULAR | Status: DC
  Administered 2024-06-06: 11 [IU] via SUBCUTANEOUS
  Administered 2024-06-06: 8 [IU] via SUBCUTANEOUS
  Administered 2024-06-07: 3 [IU] via SUBCUTANEOUS
  Administered 2024-06-07: 5 [IU] via SUBCUTANEOUS
  Administered 2024-06-08: 3 [IU] via SUBCUTANEOUS

## 2024-06-05 MED ORDER — PIPERACILLIN-TAZOBACTAM 3.375 G IVPB 30 MIN
3.3750 g | Freq: Once | INTRAVENOUS | Status: AC
  Administered 2024-06-05: 3.375 g via INTRAVENOUS
  Filled 2024-06-05: qty 50

## 2024-06-05 MED ORDER — ONDANSETRON HCL 4 MG/2ML IJ SOLN
4.0000 mg | Freq: Once | INTRAMUSCULAR | Status: AC
  Administered 2024-06-05: 4 mg via INTRAVENOUS
  Filled 2024-06-05: qty 2

## 2024-06-05 MED ORDER — OXYCODONE-ACETAMINOPHEN 10-325 MG PO TABS: 1.0000 | ORAL_TABLET | Freq: Three times a day (TID) | ORAL | Status: DC | PRN

## 2024-06-05 MED ORDER — INSULIN ASPART 100 UNIT/ML IJ SOLN
0.0000 [IU] | Freq: Every day | INTRAMUSCULAR | Status: DC
  Administered 2024-06-05: 4 [IU] via SUBCUTANEOUS

## 2024-06-05 MED ORDER — ATORVASTATIN CALCIUM 40 MG PO TABS
80.0000 mg | ORAL_TABLET | Freq: Every day | ORAL | Status: DC
  Administered 2024-06-06 – 2024-06-08 (×3): 80 mg via ORAL
  Filled 2024-06-05 (×3): qty 2

## 2024-06-05 MED ORDER — OXYCODONE-ACETAMINOPHEN 5-325 MG PO TABS
1.0000 | ORAL_TABLET | Freq: Three times a day (TID) | ORAL | Status: DC | PRN
  Administered 2024-06-05 – 2024-06-08 (×6): 1 via ORAL
  Filled 2024-06-05 (×6): qty 1

## 2024-06-05 MED ORDER — SERTRALINE HCL 50 MG PO TABS
50.0000 mg | ORAL_TABLET | Freq: Every day | ORAL | Status: DC
  Administered 2024-06-06 – 2024-06-08 (×3): 50 mg via ORAL
  Filled 2024-06-05 (×3): qty 1

## 2024-06-05 MED ORDER — ONDANSETRON HCL 4 MG/2ML IJ SOLN: 4.0000 mg | Freq: Four times a day (QID) | INTRAMUSCULAR | Status: DC | PRN

## 2024-06-05 MED ORDER — POLYETHYLENE GLYCOL 3350 17 G PO PACK
17.0000 g | PACK | Freq: Every day | ORAL | Status: DC | PRN
Start: 2024-06-05 — End: 2024-06-08

## 2024-06-05 MED ORDER — MAGNESIUM SULFATE 2 GM/50ML IV SOLN
2.0000 g | Freq: Once | INTRAVENOUS | Status: AC
  Administered 2024-06-05: 2 g via INTRAVENOUS
  Filled 2024-06-05: qty 50

## 2024-06-05 MED ORDER — ACETAMINOPHEN 325 MG PO TABS: 650.0000 mg | ORAL_TABLET | Freq: Four times a day (QID) | ORAL | Status: DC | PRN

## 2024-06-05 MED ORDER — CHLORHEXIDINE GLUCONATE CLOTH 2 % EX PADS
6.0000 | MEDICATED_PAD | Freq: Every day | CUTANEOUS | Status: DC
  Administered 2024-06-06 – 2024-06-08 (×3): 6 via TOPICAL

## 2024-06-05 MED ORDER — MORPHINE SULFATE (PF) 4 MG/ML IV SOLN
4.0000 mg | Freq: Once | INTRAVENOUS | Status: AC
  Administered 2024-06-05: 4 mg via INTRAVENOUS
  Filled 2024-06-05: qty 1

## 2024-06-05 MED ORDER — INSULIN ASPART PROT & ASPART (70-30 MIX) 100 UNIT/ML ~~LOC~~ SUSP
30.0000 [IU] | Freq: Two times a day (BID) | SUBCUTANEOUS | Status: DC
  Administered 2024-06-06 – 2024-06-07 (×2): 30 [IU] via SUBCUTANEOUS
  Filled 2024-06-05: qty 10

## 2024-06-05 MED ORDER — APIXABAN 5 MG PO TABS
5.0000 mg | ORAL_TABLET | Freq: Two times a day (BID) | ORAL | Status: DC
  Administered 2024-06-05 – 2024-06-08 (×6): 5 mg via ORAL
  Filled 2024-06-05 (×6): qty 1

## 2024-06-05 MED ORDER — ONDANSETRON HCL 4 MG PO TABS: 4.0000 mg | ORAL_TABLET | Freq: Four times a day (QID) | ORAL | Status: DC | PRN

## 2024-06-05 MED ORDER — INSULIN ASPART 100 UNIT/ML IJ SOLN
10.0000 [IU] | Freq: Once | INTRAMUSCULAR | Status: AC
Start: 2024-06-05 — End: 2024-06-05
  Administered 2024-06-05: 10 [IU] via SUBCUTANEOUS
  Filled 2024-06-05: qty 1

## 2024-06-05 MED ORDER — SODIUM CHLORIDE 0.9 % IV SOLN: INTRAVENOUS | Status: AC

## 2024-06-05 MED ORDER — BUSPIRONE HCL 5 MG PO TABS
10.0000 mg | ORAL_TABLET | Freq: Two times a day (BID) | ORAL | Status: DC
  Administered 2024-06-06 – 2024-06-08 (×4): 10 mg via ORAL
  Filled 2024-06-05 (×4): qty 2

## 2024-06-05 NOTE — ED Notes (Signed)
 ED TO INPATIENT HANDOFF REPORT  ED Nurse Name and Phone #: [phone]  S Name/Age/Gender Amy Livingston 68 y.o. female Room/Bed: APAH4/APAH4  Code Status   Code Status: Prior  Home/SNF/Other Home Patient oriented to: self, place, time, and situation Is this baseline? Yes   Triage Complete: Triage complete  Chief Complaint Complicated UTI (urinary tract infection) [N39.0]  Triage Note Pt bib rcems w/ c/o weakness, fall and hyperglycemia. Pt reports she has been weak recently and has been falling daily. PT cbg in route was 534. Pt is alert and oriented X 4   Allergies Allergies  Allergen Reactions   Bee Venom Anaphylaxis    Has epi pen for this   Elemental Sulfur Itching and Rash    Yeast infections   Metformin  And Related Diarrhea    GI distress    Level of Care/Admitting Diagnosis ED Disposition     ED Disposition  Admit   Condition  --   Comment  Hospital Area: [facility] [[id number]]  Level of Care: Med-Surg [16]  Diagnosis: Complicated UTI (urinary tract infection) [280444]  Admitting Physician: EMOKPAE, EJIROGHENE E [[id number]]  Attending Physician: EMOKPAE, EJIROGHENE E [email or web address]  Certification:: I certify this patient will need inpatient services for at least 2 midnights  Expected Medical Readiness: 06/07/2024          B Medical/Surgery History Past Medical History:  Diagnosis Date   Anxiety    Atrial fibrillation (HCC)    Diabetes mellitus without complication (HCC)    Dysrhythmia    GERD (gastroesophageal reflux disease)    Hypertension    PONV (postoperative nausea and vomiting)    Shortness of breath dyspnea    Past Surgical History:  Procedure Laterality Date   ABDOMINAL HYSTERECTOMY     ANTERIOR CERVICAL DECOMP/DISCECTOMY FUSION N/A 01/13/2016   Procedure: C5-6 ANTERIOR CERVICAL DECOMPRESSION/DISKECTOMY/FUSION;  Surgeon: Victory Gens, MD;  Location: [facility];  Service: Neurosurgery;  Laterality: N/A;  C5-6 ANTERIOR CERVICAL  DECOMPRESSION/DISKECTOMY/FUSION   BACK SURGERY     x 5   BIOPSY  07/25/2023   Procedure: BIOPSY;  Surgeon: Cindie Carlin POUR, DO;  Location: [facility];  Service: Endoscopy;;   CHOLECYSTECTOMY     COLONOSCOPY WITH PROPOFOL  N/A 07/25/2023   Procedure: COLONOSCOPY WITH PROPOFOL ;  Surgeon: Cindie Carlin POUR, DO;  Location: [facility];  Service: Endoscopy;  Laterality: N/A;  10:45 am, asa 3   ESOPHAGEAL BRUSHING  07/25/2023   Procedure: ESOPHAGEAL BRUSHING;  Surgeon: Cindie Carlin POUR, DO;  Location: [facility];  Service: Endoscopy;;   ESOPHAGOGASTRODUODENOSCOPY (EGD) WITH PROPOFOL  N/A 07/25/2023   Procedure: ESOPHAGOGASTRODUODENOSCOPY (EGD) WITH PROPOFOL ;  Surgeon: Cindie Carlin POUR, DO;  [facility]: [facility];  Service: Endoscopy;  Laterality: N/A;   FOOT SURGERY     KNEE ARTHROSCOPY     left   KNEE SURGERY Right    POLYPECTOMY  07/25/2023   Procedure: POLYPECTOMY INTESTINAL;  Surgeon: Cindie Carlin POUR, DO;  Location: [facility];  Service: Endoscopy;;   TEE WITHOUT CARDIOVERSION N/A 10/13/2023   Procedure: ECHOCARDIOGRAM, TRANSESOPHAGEAL;  Surgeon: Stacia Diannah SQUIBB, MD;  Location: [facility];  Service: Cardiovascular;  Laterality: N/A;   TUBAL LIGATION       A IV Location/Drains/Wounds Patient Lines/Drains/Airways Status     Active Line/Drains/Airways     Name Placement date Placement time Site Days   Peripheral IV 06/05/24 20 G Right Wrist 06/05/24  1404  Wrist  less than 1   Urethral Catheter [place] NT+3 Latex  14 Fr. 06/05/24  1707  Latex  less than 1            Intake/Output Last 24 hours  Intake/Output Summary (Last 24 hours) at 06/05/2024 1853 Last data filed at 06/05/2024 1849 Gross per 24 hour  Intake 1100.16 ml  Output --  Net 1100.16 ml    Labs/Imaging Results for orders placed or performed during the hospital encounter of 06/05/24 (from the past 48 hours)  CBG monitoring, ED     Status: Abnormal   Collection Time: 06/05/24  1:42 PM  Result  Value Ref Range   Glucose-Capillary 385 (H) 70 - 99 mg/dL    Comment: Glucose reference range applies only to samples taken after fasting for at least 8 hours.  Comprehensive metabolic panel     Status: Abnormal   Collection Time: 06/05/24  2:55 PM  Result Value Ref Range   Sodium 130 (L) 135 - 145 mmol/L   Potassium 4.3 3.5 - 5.1 mmol/L   Chloride 95 (L) 98 - 111 mmol/L   CO2 26 22 - 32 mmol/L   Glucose, Bld 456 (H) 70 - 99 mg/dL    Comment: Glucose reference range applies only to samples taken after fasting for at least 8 hours.   BUN 13 8 - 23 mg/dL   Creatinine, Ser 8.78 (H) 0.44 - 1.00 mg/dL   Calcium  9.4 8.9 - 10.3 mg/dL   Total Protein 6.5 6.5 - 8.1 g/dL   Albumin 2.9 (L) 3.5 - 5.0 g/dL   AST <89 (L) 15 - 41 U/L   ALT <5 0 - 44 U/L   Alkaline Phosphatase 151 (H) 38 - 126 U/L   Total Bilirubin 0.3 0.0 - 1.2 mg/dL   GFR, Estimated 49 (L) >60 mL/min    Comment: (NOTE) Calculated using the CKD-EPI Creatinine Equation (2021)    Anion gap 9 5 - 15    Comment: Performed at [facility], [address], [place], [place] [address]  CBC with Differential     Status: Abnormal   Collection Time: 06/05/24  2:55 PM  Result Value Ref Range   WBC 8.9 4.0 - 10.5 K/uL   RBC 2.91 (L) 3.87 - 5.11 MIL/uL   Hemoglobin 7.2 (L) 12.0 - 15.0 g/dL   HCT 76.0 (L) 63.9 - 53.9 %   MCV 82.1 80.0 - 100.0 fL   MCH 24.7 (L) 26.0 - 34.0 pg   MCHC 30.1 30.0 - 36.0 g/dL   RDW 81.0 (H) 88.4 - 84.4 %   Platelets 340 150 - 400 K/uL   nRBC 0.0 0.0 - 0.2 %   Neutrophils Relative % 69 %   Neutro Abs 6.2 1.7 - 7.7 K/uL   Lymphocytes Relative 18 %   Lymphs Abs 1.6 0.7 - 4.0 K/uL   Monocytes Relative 9 %   Monocytes Absolute 0.8 0.1 - 1.0 K/uL   Eosinophils Relative 3 %   Eosinophils Absolute 0.2 0.0 - 0.5 K/uL   Basophils Relative 0 %   Basophils Absolute 0.0 0.0 - 0.1 K/uL   Immature Granulocytes 1 %   Abs Immature Granulocytes 0.05 0.00 - 0.07 K/uL    Comment: Performed at [facility], [address] [place], [place] [address]  Lactic acid, plasma     Status: None   Collection Time: 06/05/24  2:55 PM  Result Value Ref Range   Lactic Acid, Venous 1.1 0.5 - 1.9 mmol/L    Comment: Performed at [facility], [address]  [place], [place] [address]  Culture, blood (routine x 2)     Status: None (Preliminary result)   Collection Time: 06/05/24  2:55 PM   Specimen: Vein; Blood  Result Value Ref Range   Specimen Description BLOOD RIGHT ANTECUBITAL    Special Requests      BOTTLES DRAWN AEROBIC AND ANAEROBIC Blood Culture adequate volume Performed at [facility], [address] [place], [place] [address]    Culture PENDING    Report Status PENDING   Culture, blood (routine x 2)     Status: None (Preliminary result)   Collection Time: 06/05/24  2:55 PM   Specimen: Vein; Blood  Result Value Ref Range   Specimen Description BLOOD RIGHT ANTECUBITAL    Special Requests      BOTTLES DRAWN AEROBIC ONLY Blood Culture results may not be optimal due to an inadequate volume of blood received in culture bottles Performed at [facility], [address], [place], [place] [address]    Culture PENDING    Report Status PENDING   Magnesium      Status: Abnormal   Collection Time: 06/05/24  2:55 PM  Result Value Ref Range   Magnesium  1.5 (L) 1.7 - 2.4 mg/dL    Comment: Performed at [facility], [address], [place], [place] [address]  Urinalysis, Routine w reflex microscopic -Urine, Clean Catch     Status: Abnormal   Collection Time: 06/05/24  5:08 PM  Result Value Ref Range   Color, Urine YELLOW YELLOW   APPearance CLOUDY (A) CLEAR   Specific Gravity, Urine 1.008 1.005 - 1.030   pH 7.0 5.0 - 8.0   Glucose, UA >=500 (A) NEGATIVE mg/dL   Hgb urine dipstick LARGE (A) NEGATIVE   Bilirubin Urine NEGATIVE NEGATIVE   Ketones, ur NEGATIVE NEGATIVE mg/dL   Protein, ur 899 (A) NEGATIVE mg/dL   Nitrite POSITIVE (A) NEGATIVE   Leukocytes,Ua LARGE (A) NEGATIVE   RBC / HPF >50 0 - 5 RBC/hpf    WBC, UA >50 0 - 5 WBC/hpf   Bacteria, UA MANY (A) NONE SEEN   Squamous Epithelial / HPF 0-5 0 - 5 /HPF   WBC Clumps PRESENT     Comment: Performed at [facility], [address], [place], [place] [address]   CT Renal Stone Study Result Date: 06/05/2024 CLINICAL DATA:  Provided history: Abdominal/flank pain, stone suspected Patient reports weakness and multiple falls. EXAM: CT ABDOMEN AND PELVIS WITHOUT CONTRAST TECHNIQUE: Multidetector CT imaging of the abdomen and pelvis was performed following the standard protocol without IV contrast. RADIATION DOSE REDUCTION: This exam was performed according to the departmental dose-optimization program which includes automated exposure control, adjustment of the mA and/or kV according to patient size and/or use of iterative reconstruction technique. COMPARISON:  CT 10/08/2023 FINDINGS: Lower chest: The lung bases are clear. Hepatobiliary: No focal liver abnormality is seen. Punctate granuloma in the inferior right lobe of the liver status post cholecystectomy. No biliary dilatation. Pancreas: No ductal dilatation or inflammation. Spleen: Normal in size without focal abnormality. Adrenals/Urinary Tract: Normal adrenal glands. Bilateral hydroureteronephrosis. On the left this has progressed, and new hydronephrosis on the right. The left renal collecting system is slightly more dilated than the right. Ureters are dilated and tortuous to the bladder insertion. There is diffuse bilateral perinephric edema. Possible punctate nonobstructing stone in the lower left kidney. Moderate bladder distension with mild trabeculation and wall thickening. 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 Stomach/Bowel: The stomach is nondistended. No bowel obstruction or inflammation. Normal appendix. Moderate colonic  stool burden. Distal colonic diverticulosis without focal diverticulitis. Vascular/Lymphatic: Aortic and branch atherosclerosis. There is retroperitoneal adenopathy at the level of the  kidneys, greater on the left. Representative lymph node measures 12 mm short axis series 11, image 36. Reproductive: Status post hysterectomy. No adnexal masses. Other: Retroperitoneal and perinephric edema. No significant ascites. No free air. Musculoskeletal: Degenerative and postsurgical change in the spine. There are no acute or suspicious osseous abnormalities. IMPRESSION: 1. Bilateral hydroureteronephrosis, progressed on the left and new on the right. There is diffuse bilateral perinephric edema. Moderate bladder distension with mild trabeculation and wall thickening. 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 Findings are suspicious for bladder outlet obstruction. 2. Retroperitoneal and perinephric edema may be due to obstructive uropathy, however recommend correlation with urinalysis to exclude urinary tract infection. 3. Retroperitoneal adenopathy at the level of the kidneys, greater on the left, indeterminate in etiology. 4. Colonic diverticulosis without focal diverticulitis. Aortic Atherosclerosis (ICD10-I70.0). Electronically Signed   By: Andrea Gasman M.D.   On: 06/05/2024 16:52   CT Head Wo Contrast Result Date: 06/05/2024 CLINICAL DATA:  Syncope/presyncope, cerebrovascular cause suspected EXAM: CT HEAD WITHOUT CONTRAST TECHNIQUE: Contiguous axial images were obtained from the base of the skull through the vertex without intravenous contrast. RADIATION DOSE REDUCTION: This exam was performed according to the departmental dose-optimization program which includes automated exposure control, adjustment of the mA and/or kV according to patient size and/or use of iterative reconstruction technique. COMPARISON:  10/08/2023 FINDINGS: Brain: No intracranial hemorrhage, mass effect, or midline shift. No hydrocephalus. The basilar cisterns are patent. 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 No evidence of territorial infarct or acute ischemia. No extra-axial or intracranial fluid  collection. Vascular: Atherosclerosis of skullbase vasculature without hyperdense vessel or abnormal calcification. Skull: No fracture or suspicious lesion. Sinuses/Orbits: Paranasal sinuses and mastoid air cells are clear. The visualized orbits are unremarkable. Other: None. IMPRESSION: No acute intracranial abnormality. Electronically Signed   By: Andrea Gasman M.D.   On: 06/05/2024 16:35   DG Chest Port 1 View Result Date: 06/05/2024 EXAM: 1 VIEW XRAY OF THE CHEST 06/05/2024 02:13:24 PM COMPARISON: 11/05/2023 CLINICAL HISTORY: weakness. weakness, fall and hyperglycemia. Pt reports she has been weak recently and has been falling daily. PT cbg in route was 534. FINDINGS: LUNGS AND PLEURA: No focal pulmonary opacity. No pulmonary edema. No pleural effusion. No pneumothorax. HEART AND MEDIASTINUM: No acute abnormality of the cardiac and mediastinal silhouettes. BONES AND SOFT TISSUES: No acute osseous abnormality. IMPRESSION: 1. No acute cardiopulmonary abnormality. Electronically signed by: Lynwood Seip MD 06/05/2024 02:18 PM EDT RP Workstation: [id number]    Pending Labs Unresulted Labs (From admission, onward)     Start     Ordered   06/06/24 0500  Creatinine, serum  Tomorrow morning,   R        06/05/24 1837   06/05/24 1703  Urine Culture  Once,   URGENT       Question:  Indication  Answer:  Dysuria   06/05/24 1702            Vitals/Pain Today's Vitals   06/05/24 1343 06/05/24 1712 06/05/24 1736 06/05/24 1849  BP: (!) 107/54 138/63    Pulse: (!) 103 96    Resp: 17 17    Temp: 98.2 F (36.8 C)  97.7 F (36.5 C)   TempSrc: Oral  Oral   SpO2: 99% 97%    PainSc:    5     Isolation Precautions No active isolations  Medications Medications  meropenem  (MERREM ) 1 g in sodium chloride  0.9 % 100 mL IVPB (has no administration in time range)  piperacillin -tazobactam (ZOSYN ) IVPB 3.375 g (0 g Intravenous Stopped 06/05/24 1704)  sodium chloride  0.9 % bolus 1,000 mL (0 mLs  Intravenous Stopped 06/05/24 1711)  insulin  aspart (novoLOG ) injection 10 Units (10 Units Subcutaneous Given 06/05/24 1721)  magnesium  sulfate IVPB 2 g 50 mL (0 g Intravenous Stopped 06/05/24 1849)  morphine  (PF) 4 MG/ML injection 4 mg (4 mg Intravenous Given 06/05/24 1750)  ondansetron  (ZOFRAN ) injection 4 mg (4 mg Intravenous Given 06/05/24 1750)    Mobility walks with device     Focused Assessments    R Recommendations: See Admitting Provider Note  Report given to:   Additional Notes:

## 2024-06-05 NOTE — H&P (Signed)
 History and Physical    Amy Livingston FMW:[id number] DOB: Dec 29, 1955 DOA: 06/05/2024  PCP: Edman Meade PEDLAR, FNP   Patient coming from: Home  I have personally briefly reviewed patient's old medical records in [facility]  Chief Complaint: Weakness, fall, hyperglycemia.  HPI: Amy Livingston is a 68 y.o. female with medical history significant for autonomic dysfunction, diabetes mellitus, hypertension, atrial fibrillation. Patient presented to the ED with complaints of several falls, generalized weakness.  EMS reports blood glucose of 534.  Patient reports she has been on treatment for her UTI with antibiotics, her last dose was yesterday, she does not remember the name of the medication.  She was in the ED 10/23, was given a dose of ceftriaxone , to complete a course of Keflex .  Recent hospitalization 10/6 to 10/9 for urinary tract infection-cultures grew Klebsiella.  With electrolyte abnormalities.  Initially started on ertapenem , due to history of ESBL infection in the past, but was transition over to Keflex  with culture showing multidrug sensitive Klebsiella.  She also had persistent diarrhea, with history of C. difficile infection, she had rectal tube placed.  Was treated with oral vancomycin .  ED Course: Temperature 98.2.  Heart rate 95-103.  Respirate rate 17-18.  Blood pressure systolic 107-188.  O2 sats greater 97% on room air. CBG 456.  Lactic acid 1.1. UA with positive nitrites and large leukocytes many bacteria. Head CT, chest x-ray negative for acute abnormality. CT renal stone study-bilateral hydroureteronephrosis, progressed on the left and new on the right, diffuse bilateral perinephric edema, moderate bladder distention and bladder wall thickening.  Findings suspicious for bladder outlet obstruction. IV Zosyn  started. 1 L bolus given.  Review of Systems: As per HPI all other systems reviewed and negative.  Past Medical History:  Diagnosis Date   Anxiety    Atrial  fibrillation (HCC)    Diabetes mellitus without complication (HCC)    Dysrhythmia    GERD (gastroesophageal reflux disease)    Hypertension    PONV (postoperative nausea and vomiting)    Shortness of breath dyspnea     Past Surgical History:  Procedure Laterality Date   ABDOMINAL HYSTERECTOMY     ANTERIOR CERVICAL DECOMP/DISCECTOMY FUSION N/A 01/13/2016   Procedure: C5-6 ANTERIOR CERVICAL DECOMPRESSION/DISKECTOMY/FUSION;  Surgeon: Victory Gens, MD;  Location: [facility];  Service: Neurosurgery;  Laterality: N/A;  C5-6 ANTERIOR CERVICAL DECOMPRESSION/DISKECTOMY/FUSION   BACK SURGERY     x 5   BIOPSY  07/25/2023   Procedure: BIOPSY;  Surgeon: Cindie Carlin POUR, DO;  Location: [facility];  Service: Endoscopy;;   CHOLECYSTECTOMY     COLONOSCOPY WITH PROPOFOL  N/A 07/25/2023   Procedure: COLONOSCOPY WITH PROPOFOL ;  Surgeon: Cindie Carlin POUR, DO;  Location: [facility];  Service: Endoscopy;  Laterality: N/A;  10:45 am, asa 3   ESOPHAGEAL BRUSHING  07/25/2023   Procedure: ESOPHAGEAL BRUSHING;  Surgeon: Cindie Carlin POUR, DO;  Location: [facility];  Service: Endoscopy;;   ESOPHAGOGASTRODUODENOSCOPY (EGD) WITH PROPOFOL  N/A 07/25/2023   Procedure: ESOPHAGOGASTRODUODENOSCOPY (EGD) WITH PROPOFOL ;  Surgeon: Cindie Carlin POUR, DO;  Location: [facility];  Service: Endoscopy;  Laterality: N/A;   FOOT SURGERY     KNEE ARTHROSCOPY     left   KNEE SURGERY Right    POLYPECTOMY  07/25/2023   Procedure: POLYPECTOMY INTESTINAL;  Surgeon: Cindie Carlin POUR, DO;  Location: [facility];  Service: Endoscopy;;   TEE WITHOUT CARDIOVERSION N/A 10/13/2023   Procedure: ECHOCARDIOGRAM, TRANSESOPHAGEAL;  Surgeon: Stacia Diannah SQUIBB, MD;  Location:  [facility];  Service: Cardiovascular;  Laterality: N/A;   TUBAL LIGATION       reports that she has quit smoking. Her smoking use included cigarettes. She started smoking about 17 years ago. She has never used smokeless tobacco. She reports that she does not  drink alcohol and does not use drugs.  Allergies  Allergen Reactions   Bee Venom Anaphylaxis    Has epi pen for this   Elemental Sulfur Itching and Rash    Yeast infections   Metformin  And Related Diarrhea    GI distress    Family History  Problem Relation Age of Onset   Renal cancer Neg Hx    Bladder Cancer Neg Hx    Uterine cancer Neg Hx     Prior to Admission medications   Medication Sig Start Date End Date Taking? Authorizing Provider  Ascorbic Acid (VITAMIN C ) 100 MG tablet Take 1 tablet (100 mg total) by mouth daily. 04/02/24   Guadlupe Lianne DASEN, MD  atorvastatin  (LIPITOR) 80 MG tablet TAKE 1 TABLET(80 MG) BY MOUTH DAILY Patient taking differently: Take 80 mg by mouth daily. 02/28/24   Bacchus, Meade PEDLAR, FNP  busPIRone  (BUSPAR ) 10 MG tablet Take 1 tablet (10 mg total) by mouth 2 (two) times daily. 06/16/23   Bacchus, Meade PEDLAR, FNP  cephALEXin  (KEFLEX ) 500 MG capsule Take 1 capsule (500 mg total) by mouth 4 (four) times daily. 05/31/24   Towana Ozell BROCKS, MD  cyclobenzaprine  (FLEXERIL ) 10 MG tablet Take 10 mg by mouth 3 (three) times daily as needed for muscle spasms.    [provider]  ELIQUIS  5 MG TABS tablet Take 5 mg by mouth 2 (two) times daily.    [provider]  estradiol  (ESTRACE ) 0.1 MG/GM vaginal cream Place 0.5 g vaginally 2 (two) times a week. Place 0.5g nightly for two weeks then twice a week after 04/05/24   Guadlupe Lianne T, MD  famotidine  (PEPCID ) 20 MG tablet Take 1 tablet (20 mg total) by mouth daily. 01/27/24   Bacchus, Meade PEDLAR, FNP  gabapentin  (NEURONTIN ) 300 MG capsule Take 1 capsule (300 mg total) by mouth 3 (three) times daily. 04/12/24   Bacchus, Meade PEDLAR, FNP  glucose blood (ACCU-CHEK GUIDE TEST) test strip USE TO TEST BLOOD SUGAR EVERY MORNING, AT NOON, AND EVERY NIGHT AT BEDTIME. 12/06/23   Bacchus, Gloria Z, FNP  guaiFENesin (ROBITUSSIN) 100 MG/5ML liquid Take 5 mLs by mouth every 4 (four) hours as needed for cough or to loosen phlegm. 05/17/24    Pokhrel, Laxman, MD  insulin  isophane & regular human KwikPen (NOVOLIN  70/30 KWIKPEN) (70-30) 100 UNIT/ML KwikPen Inject 30 Units into the skin 2 (two) times daily with a meal. 03/22/24   Bacchus, Meade PEDLAR, FNP  meclizine  (ANTIVERT ) 25 MG tablet Take 1 tablet (25 mg total) by mouth daily as needed for dizziness. 03/22/24   Bacchus, Gloria Z, FNP  mometasone  (ELOCON ) 0.1 % cream Apply topically daily as needed for itch Patient taking differently: Apply 1 Application topically daily as needed (itching). Apply topically daily as needed for itch 02/15/24   Karis Clunes, MD  Multiple Vitamin (MULTIVITAMIN WITH MINERALS) TABS tablet Take 1 tablet by mouth daily. 05/18/24   Pokhrel, Laxman, MD  ondansetron  (ZOFRAN ) 4 MG tablet Take 1 tablet (4 mg total) by mouth every 6 (six) hours as needed for nausea. Patient taking differently: Take 4 mg by mouth 2 (two) times daily. 10/14/23   Pearlean Manus, MD  oxyCODONE -acetaminophen  (PERCOCET) 10-325 MG tablet  Take 1 tablet by mouth every 8 (eight) hours as needed for pain.    [provider]  sertraline  (ZOLOFT ) 50 MG tablet TAKE 1 TABLET(50 MG) BY MOUTH DAILY 05/31/24   Bacchus, Gloria Z, FNP  sucralfate  (CARAFATE ) 1 g tablet Take 1 tablet (1 g total) by mouth 4 (four) times daily -  with meals and at bedtime. 01/27/24   Bacchus, Meade PEDLAR, FNP  tirzepatide  (MOUNJARO ) 15 MG/0.5ML Pen Inject 15 mg into the skin once a week. Patient taking differently: Inject 15 mg into the skin every Monday. 03/22/24   Bacchus, Meade PEDLAR, FNP  traZODone  (DESYREL ) 100 MG tablet Take 1 tablet (100 mg total) by mouth at bedtime. 03/22/24   Edman Meade PEDLAR, FNP    Physical Exam: Vitals:   06/05/24 1343 06/05/24 1712 06/05/24 1736  BP: (!) 107/54 138/63   Pulse: (!) 103 96   Resp: 17 17   Temp: 98.2 F (36.8 C)  97.7 F (36.5 C)  TempSrc: Oral  Oral  SpO2: 99% 97%     Constitutional: NAD, calm, comfortable Vitals:   06/05/24 1343 06/05/24 1712 06/05/24 1736  BP: (!) 107/54  138/63   Pulse: (!) 103 96   Resp: 17 17   Temp: 98.2 F (36.8 C)  97.7 F (36.5 C)  TempSrc: Oral  Oral  SpO2: 99% 97%    Eyes: PERRL, lids and conjunctivae normal ENMT: Mucous membranes are moist.   Neck: normal, supple, no masses, no thyromegaly Respiratory: clear to auscultation bilaterally, no wheezing, no crackles. Normal respiratory effort. No accessory muscle use.  Cardiovascular: Regular rate and rhythm, no murmurs / rubs / gallops. No extremity edema.  Extremities warm. Abdomen: no tenderness, no masses palpated. No hepatosplenomegaly Musculoskeletal: no clubbing / cyanosis. No joint deformity upper and lower extremities.  Skin: no rashes, lesions, ulcers. No induration Neurologic: No facial asymmetry, 5/5 strength to bilateral upper extremity with good equal grip strength, 4+/5 strength to bilateral lower extremities. Psychiatric: Normal judgment and insight. Alert and oriented x 3. Normal mood.   Labs on Admission: I have personally reviewed following labs and imaging studies  CBC: Recent Labs  Lab 05/31/24 1355 06/05/24 1455  WBC 7.8 8.9  NEUTROABS 4.8 6.2  HGB 7.9* 7.2*  HCT 26.4* 23.9*  MCV 81.2 82.1  PLT 310 340   Basic Metabolic Panel: Recent Labs  Lab 05/31/24 1355 06/05/24 1455  NA 132* 130*  K 5.0 4.3  CL 95* 95*  CO2 25 26  GLUCOSE 470* 456*  BUN 18 13  CREATININE 1.29* 1.21*  CALCIUM  8.7* 9.4  MG  --  1.5*   GFR: Estimated Creatinine Clearance: 47.9 mL/min (A) (by C-G formula based on SCr of 1.21 mg/dL (H)). Liver Function Tests: Recent Labs  Lab 05/31/24 1355 06/05/24 1455  AST 11* <10*  ALT 7 <5  ALKPHOS 174* 151*  BILITOT 0.4 0.3  PROT 7.0 6.5  ALBUMIN 3.0* 2.9*   CBG: Recent Labs  Lab 05/31/24 1315 06/05/24 1342  GLUCAP 454* 385*   Urine analysis:    Component Value Date/Time   COLORURINE YELLOW 06/05/2024 1708   APPEARANCEUR CLOUDY (A) 06/05/2024 1708   APPEARANCEUR Cloudy (A) 11/18/2023 1145   LABSPEC 1.008  06/05/2024 1708   PHURINE 7.0 06/05/2024 1708   GLUCOSEU >=500 (A) 06/05/2024 1708   HGBUR LARGE (A) 06/05/2024 1708   BILIRUBINUR NEGATIVE 06/05/2024 1708   BILIRUBINUR negative 04/02/2024 1626   BILIRUBINUR Negative 11/18/2023 1145   KETONESUR NEGATIVE 06/05/2024 1708  PROTEINUR 100 (A) 06/05/2024 1708   UROBILINOGEN 0.2 04/02/2024 1626   NITRITE POSITIVE (A) 06/05/2024 1708   LEUKOCYTESUR LARGE (A) 06/05/2024 1708    Radiological Exams on Admission: CT Renal Stone Study Result Date: 06/05/2024 CLINICAL DATA:  Provided history: Abdominal/flank pain, stone suspected Patient reports weakness and multiple falls. EXAM: CT ABDOMEN AND PELVIS WITHOUT CONTRAST TECHNIQUE: Multidetector CT imaging of the abdomen and pelvis was performed following the standard protocol without IV contrast. RADIATION DOSE REDUCTION: This exam was performed according to the departmental dose-optimization program which includes automated exposure control, adjustment of the mA and/or kV according to patient size and/or use of iterative reconstruction technique. COMPARISON:  CT 10/08/2023 FINDINGS: Lower chest: The lung bases are clear. Hepatobiliary: No focal liver abnormality is seen. Punctate granuloma in the inferior right lobe of the liver status post cholecystectomy. No biliary dilatation. Pancreas: No ductal dilatation or inflammation. Spleen: Normal in size without focal abnormality. Adrenals/Urinary Tract: Normal adrenal glands. Bilateral hydroureteronephrosis. On the left this has progressed, and new hydronephrosis on the right. The left renal collecting system is slightly more dilated than the right. Ureters are dilated and tortuous to the bladder insertion. There is diffuse bilateral perinephric edema. Possible punctate nonobstructing stone in the lower left kidney. Moderate bladder distension with mild trabeculation and wall thickening. 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 Stomach/Bowel: The stomach is nondistended. No  bowel obstruction or inflammation. Normal appendix. Moderate colonic stool burden. Distal colonic diverticulosis without focal diverticulitis. Vascular/Lymphatic: Aortic and branch atherosclerosis. There is retroperitoneal adenopathy at the level of the kidneys, greater on the left. Representative lymph node measures 12 mm short axis series 11, image 36. Reproductive: Status post hysterectomy. No adnexal masses. Other: Retroperitoneal and perinephric edema. No significant ascites. No free air. Musculoskeletal: Degenerative and postsurgical change in the spine. There are no acute or suspicious osseous abnormalities. IMPRESSION: 1. Bilateral hydroureteronephrosis, progressed on the left and new on the right. There is diffuse bilateral perinephric edema. Moderate bladder distension with mild trabeculation and wall thickening. 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 Findings are suspicious for bladder outlet obstruction. 2. Retroperitoneal and perinephric edema may be due to obstructive uropathy, however recommend correlation with urinalysis to exclude urinary tract infection. 3. Retroperitoneal adenopathy at the level of the kidneys, greater on the left, indeterminate in etiology. 4. Colonic diverticulosis without focal diverticulitis. Aortic Atherosclerosis (ICD10-I70.0). Electronically Signed   By: Andrea Gasman M.D.   On: 06/05/2024 16:52   CT Head Wo Contrast Result Date: 06/05/2024 CLINICAL DATA:  Syncope/presyncope, cerebrovascular cause suspected EXAM: CT HEAD WITHOUT CONTRAST TECHNIQUE: Contiguous axial images were obtained from the base of the skull through the vertex without intravenous contrast. RADIATION DOSE REDUCTION: This exam was performed according to the departmental dose-optimization program which includes automated exposure control, adjustment of the mA and/or kV according to patient size and/or use of iterative reconstruction technique. COMPARISON:  10/08/2023 FINDINGS: Brain: No intracranial  hemorrhage, mass effect, or midline shift. No hydrocephalus. The basilar cisterns are patent. 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 No evidence of territorial infarct or acute ischemia. No extra-axial or intracranial fluid collection. Vascular: Atherosclerosis of skullbase vasculature without hyperdense vessel or abnormal calcification. Skull: No fracture or suspicious lesion. Sinuses/Orbits: Paranasal sinuses and mastoid air cells are clear. The visualized orbits are unremarkable. Other: None. IMPRESSION: No acute intracranial abnormality. Electronically Signed   By: Andrea Gasman M.D.   On: 06/05/2024 16:35   DG Chest Port 1 View Result Date: 06/05/2024 EXAM: 1 VIEW XRAY  OF THE CHEST 06/05/2024 02:13:24 PM COMPARISON: 11/05/2023 CLINICAL HISTORY: weakness. weakness, fall and hyperglycemia. Pt reports she has been weak recently and has been falling daily. PT cbg in route was 534. FINDINGS: LUNGS AND PLEURA: No focal pulmonary opacity. No pulmonary edema. No pleural effusion. No pneumothorax. HEART AND MEDIASTINUM: No acute abnormality of the cardiac and mediastinal silhouettes. BONES AND SOFT TISSUES: No acute osseous abnormality. IMPRESSION: 1. No acute cardiopulmonary abnormality. Electronically signed by: Lynwood Seip MD 06/05/2024 02:18 PM EDT RP Workstation: [id number]   EKG: Independently reviewed.  Sinus rhythm, rate 98, QTc 457.  No significant change from prior.  Assessment/Plan Principal Problem:   Complicated UTI (urinary tract infection) Active Problems:   Hyponatremia   Hypomagnesemia   Bladder outlet obstruction   Type 2 diabetes mellitus with hyperglycemia (HCC)   PAF (paroxysmal atrial fibrillation) (HCC)   Essential hypertension  Assessment and Plan:  Complicated urinary tract infection-with bilateral hydro ureteric nephrosis, bladder outlet obstruction, pyelonephritis, last urine cultures 05/31/2024 grew ESBL Klebsiella.  Afebrile.  WBC  8.9.  Lactic acid 1.1.  Rules out for sepsis.  UA suggestive of UTI with positive nitrites large leukocytes and many bacteria. - IV Zosyn  started in the ED, I consulted with pharmacy, recommendations to use meropenem  instead - 1 L bolus given, continue N/s 100cc/hr x 10hrs - Follow-up urine cultures - Follow-up blood cultures  Bladder outlet obstruction-patient also reports fecal incontinence x 1x month, multiple falls from bilateral lower extremities buckling when bearing weight,-ongoing for about 10 months.  Reports chronic unchanged back pain, status post multiple surgeries -per patient 7 back surgeries.  - MRI thoracolumbar spine in a.m. -Foley catheter  Multiple falls, chronic back pain - Follow-up MRI  Paroxysmal atrial fibrillation-rate controlled and on anticoagulation with Eliquis  - Resume Eliquis   Hypomagnesemia, hyponatremia sodium 130, magnesium  1.5.  Baseline Sodium- low to mid 130s.   Hypertension-stable. Not on medications.  Diabetes mellitus with hyperglycemia-uncontrolled.  A1c 8.9. - SSI- M - Resume NPH 30 units twice daily - Hold Mounjaro   Anxiety disorder - Resume buspirone , Zoloft   DVT prophylaxis: Lovenox Code Status: FULL Family Communication: none at bedside Disposition Plan: ~ 2 days Consults called: None Admission status: Inpt med surg I certify that at the point of admission it is my clinical judgment that the patient will require inpatient hospital care spanning beyond 2 midnights from the point of admission due to high intensity of service, high risk for further deterioration and high frequency of surveillance required.     Author: Tully FORBES Carwin, MD 06/05/2024 10:14 PM  For on call review [email or web address].

## 2024-06-05 NOTE — ED Provider Notes (Signed)
 [place] EMERGENCY DEPARTMENT AT [facility] Provider Note   CSN: [id number] Arrival date & time: 06/05/24  1322     Patient presents with: Weakness   Amy Livingston is a 68 y.o. female.   Patient is a 68 year old female who presents to emergency department the chief complaint of generalized weakness, multiple falls, hyperglycemia, dysuria and lower abdominal pain.  Patient notes that this has been progressively worsening over the past week.  She was recently seen in the emergency department and diagnosed with urinary tract infection.  Patient notes that she has had no associated fever or chills.  She denies any associated chest pain or shortness of breath.  She denies any associated syncopal events.  She does note that she has had some intermittent dizziness.   Weakness      Prior to Admission medications   Medication Sig Start Date End Date Taking? Authorizing Provider  Ascorbic Acid (VITAMIN C ) 100 MG tablet Take 1 tablet (100 mg total) by mouth daily. 04/02/24   Guadlupe Lianne DASEN, MD  atorvastatin  (LIPITOR) 80 MG tablet TAKE 1 TABLET(80 MG) BY MOUTH DAILY Patient taking differently: Take 80 mg by mouth daily. 02/28/24   Bacchus, Meade PEDLAR, FNP  busPIRone  (BUSPAR ) 10 MG tablet Take 1 tablet (10 mg total) by mouth 2 (two) times daily. 06/16/23   Bacchus, Meade PEDLAR, FNP  cephALEXin  (KEFLEX ) 500 MG capsule Take 1 capsule (500 mg total) by mouth 4 (four) times daily. 05/31/24   Towana Ozell BROCKS, MD  cyclobenzaprine  (FLEXERIL ) 10 MG tablet Take 10 mg by mouth 3 (three) times daily as needed for muscle spasms.    [provider]  ELIQUIS  5 MG TABS tablet Take 5 mg by mouth 2 (two) times daily.    [provider]  estradiol  (ESTRACE ) 0.1 MG/GM vaginal cream Place 0.5 g vaginally 2 (two) times a week. Place 0.5g nightly for two weeks then twice a week after 04/05/24   Guadlupe Lianne T, MD  famotidine  (PEPCID ) 20 MG tablet Take 1 tablet (20 mg total) by mouth daily. 01/27/24    Bacchus, Meade PEDLAR, FNP  gabapentin  (NEURONTIN ) 300 MG capsule Take 1 capsule (300 mg total) by mouth 3 (three) times daily. 04/12/24   Bacchus, Meade PEDLAR, FNP  glucose blood (ACCU-CHEK GUIDE TEST) test strip USE TO TEST BLOOD SUGAR EVERY MORNING, AT NOON, AND EVERY NIGHT AT BEDTIME. 12/06/23   Bacchus, Gloria Z, FNP  guaiFENesin (ROBITUSSIN) 100 MG/5ML liquid Take 5 mLs by mouth every 4 (four) hours as needed for cough or to loosen phlegm. 05/17/24   Pokhrel, Laxman, MD  insulin  isophane & regular human KwikPen (NOVOLIN  70/30 KWIKPEN) (70-30) 100 UNIT/ML KwikPen Inject 30 Units into the skin 2 (two) times daily with a meal. 03/22/24   Bacchus, Meade PEDLAR, FNP  meclizine  (ANTIVERT ) 25 MG tablet Take 1 tablet (25 mg total) by mouth daily as needed for dizziness. 03/22/24   Bacchus, Gloria Z, FNP  mometasone  (ELOCON ) 0.1 % cream Apply topically daily as needed for itch Patient taking differently: Apply 1 Application topically daily as needed (itching). Apply topically daily as needed for itch 02/15/24   Karis Clunes, MD  Multiple Vitamin (MULTIVITAMIN WITH MINERALS) TABS tablet Take 1 tablet by mouth daily. 05/18/24   Pokhrel, Vernal, MD  ondansetron  (ZOFRAN ) 4 MG tablet Take 1 tablet (4 mg total) by mouth every 6 (six) hours as needed for nausea. Patient taking differently: Take 4 mg by mouth 2 (two) times daily. 10/14/23  Pearlean Manus, MD  oxyCODONE -acetaminophen  (PERCOCET) 10-325 MG tablet Take 1 tablet by mouth every 8 (eight) hours as needed for pain.    [provider]  sertraline  (ZOLOFT ) 50 MG tablet TAKE 1 TABLET(50 MG) BY MOUTH DAILY 05/31/24   Bacchus, Gloria Z, FNP  sucralfate  (CARAFATE ) 1 g tablet Take 1 tablet (1 g total) by mouth 4 (four) times daily -  with meals and at bedtime. 01/27/24   Bacchus, Meade PEDLAR, FNP  tirzepatide  (MOUNJARO ) 15 MG/0.5ML Pen Inject 15 mg into the skin once a week. Patient taking differently: Inject 15 mg into the skin every Monday. 03/22/24   Bacchus, Meade PEDLAR, FNP   traZODone  (DESYREL ) 100 MG tablet Take 1 tablet (100 mg total) by mouth at bedtime. 03/22/24   Bacchus, Meade PEDLAR, FNP    Allergies: Bee venom, Elemental sulfur, and Metformin  and related    Review of Systems  Neurological:  Positive for weakness.  All other systems reviewed and are negative.   Updated Vital Signs BP 138/63 (BP Location: Right Arm)   Pulse 96   Temp 98.2 F (36.8 C) (Oral)   Resp 17   SpO2 97%   Physical Exam Vitals and nursing note reviewed.  Constitutional:      General: She is not in acute distress.    Appearance: Normal appearance. She is not ill-appearing.  HENT:     Head: Normocephalic and atraumatic.     Nose: Nose normal.     Mouth/Throat:     Mouth: Mucous membranes are moist.  Eyes:     Extraocular Movements: Extraocular movements intact.     Conjunctiva/sclera: Conjunctivae normal.     Pupils: Pupils are equal, round, and reactive to light.  Cardiovascular:     Rate and Rhythm: Normal rate and regular rhythm.     Pulses: Normal pulses.     Heart sounds: Normal heart sounds. No murmur heard.    No gallop.  Pulmonary:     Effort: Pulmonary effort is normal. No respiratory distress.     Breath sounds: Normal breath sounds. No stridor. No wheezing, rhonchi or rales.  Abdominal:     General: Abdomen is flat. Bowel sounds are normal. There is no distension.     Palpations: Abdomen is soft.     Tenderness: There is no guarding.     Comments: Tender to palpation to suprapubic region  Musculoskeletal:        General: Normal range of motion.     Cervical back: Normal range of motion and neck supple. No rigidity or tenderness.  Skin:    General: Skin is warm and dry.     Findings: No bruising or rash.  Neurological:     General: No focal deficit present.     Mental Status: She is alert and oriented to person, place, and time. Mental status is at baseline.     Cranial Nerves: No cranial nerve deficit.     Sensory: No sensory deficit.     Motor:  No weakness.     Coordination: Coordination normal.     Gait: Gait normal.  Psychiatric:        Mood and Affect: Mood normal.        Behavior: Behavior normal.        Thought Content: Thought content normal.        Judgment: Judgment normal.     (all labs ordered are listed, but only abnormal results are displayed) Labs Reviewed  COMPREHENSIVE METABOLIC PANEL WITH  GFR - Abnormal; Notable for the following components:      Result Value   Sodium 130 (*)    Chloride 95 (*)    Glucose, Bld 456 (*)    Creatinine, Ser 1.21 (*)    Albumin 2.9 (*)    AST <10 (*)    Alkaline Phosphatase 151 (*)    GFR, Estimated 49 (*)    All other components within normal limits  CBC WITH DIFFERENTIAL/PLATELET - Abnormal; Notable for the following components:   RBC 2.91 (*)    Hemoglobin 7.2 (*)    HCT 23.9 (*)    MCH 24.7 (*)    RDW 18.9 (*)    All other components within normal limits  MAGNESIUM  - Abnormal; Notable for the following components:   Magnesium  1.5 (*)    All other components within normal limits  CBG MONITORING, ED - Abnormal; Notable for the following components:   Glucose-Capillary 385 (*)    All other components within normal limits  CULTURE, BLOOD (ROUTINE X 2)  CULTURE, BLOOD (ROUTINE X 2)  URINE CULTURE  LACTIC ACID, PLASMA  URINALYSIS, ROUTINE W REFLEX MICROSCOPIC    EKG: EKG Interpretation Date/Time:  Tuesday June 05 2024 14:37:35 EDT Ventricular Rate:  98 PR Interval:  156 QRS Duration:  84 QT Interval:  358 QTC Calculation: 457 R Axis:   71  Text Interpretation: Normal sinus rhythm Normal ECG When compared with ECG of 05-Nov-2023 13:17, No significant change since last tracing Confirmed by Towana Sharper [phone]) on 06/05/2024 3:08:33 PM  Radiology: CT Renal Stone Study Result Date: 06/05/2024 CLINICAL DATA:  Provided history: Abdominal/flank pain, stone suspected Patient reports weakness and multiple falls. EXAM: CT ABDOMEN AND PELVIS WITHOUT CONTRAST  TECHNIQUE: Multidetector CT imaging of the abdomen and pelvis was performed following the standard protocol without IV contrast. RADIATION DOSE REDUCTION: This exam was performed according to the departmental dose-optimization program which includes automated exposure control, adjustment of the mA and/or kV according to patient size and/or use of iterative reconstruction technique. COMPARISON:  CT 10/08/2023 FINDINGS: Lower chest: The lung bases are clear. Hepatobiliary: No focal liver abnormality is seen. Punctate granuloma in the inferior right lobe of the liver status post cholecystectomy. No biliary dilatation. Pancreas: No ductal dilatation or inflammation. Spleen: Normal in size without focal abnormality. Adrenals/Urinary Tract: Normal adrenal glands. Bilateral hydroureteronephrosis. On the left this has progressed, and new hydronephrosis on the right. The left renal collecting system is slightly more dilated than the right. Ureters are dilated and tortuous to the bladder insertion. There is diffuse bilateral perinephric edema. Possible punctate nonobstructing stone in the lower left kidney. Moderate bladder distension with mild trabeculation and wall thickening. 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 Stomach/Bowel: The stomach is nondistended. No bowel obstruction or inflammation. Normal appendix. Moderate colonic stool burden. Distal colonic diverticulosis without focal diverticulitis. Vascular/Lymphatic: Aortic and branch atherosclerosis. There is retroperitoneal adenopathy at the level of the kidneys, greater on the left. Representative lymph node measures 12 mm short axis series 11, image 36. Reproductive: Status post hysterectomy. No adnexal masses. Other: Retroperitoneal and perinephric edema. No significant ascites. No free air. Musculoskeletal: Degenerative and postsurgical change in the spine. There are no acute or suspicious osseous abnormalities. IMPRESSION: 1. Bilateral hydroureteronephrosis, progressed  on the left and new on the right. There is diffuse bilateral perinephric edema. Moderate bladder distension with mild trabeculation and wall thickening. 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 Findings are suspicious for bladder outlet obstruction. 2. Retroperitoneal and perinephric edema may be due to obstructive  uropathy, however recommend correlation with urinalysis to exclude urinary tract infection. 3. Retroperitoneal adenopathy at the level of the kidneys, greater on the left, indeterminate in etiology. 4. Colonic diverticulosis without focal diverticulitis. Aortic Atherosclerosis (ICD10-I70.0). Electronically Signed   By: Andrea Gasman M.D.   On: 06/05/2024 16:52   CT Head Wo Contrast Result Date: 06/05/2024 CLINICAL DATA:  Syncope/presyncope, cerebrovascular cause suspected EXAM: CT HEAD WITHOUT CONTRAST TECHNIQUE: Contiguous axial images were obtained from the base of the skull through the vertex without intravenous contrast. RADIATION DOSE REDUCTION: This exam was performed according to the departmental dose-optimization program which includes automated exposure control, adjustment of the mA and/or kV according to patient size and/or use of iterative reconstruction technique. COMPARISON:  10/08/2023 FINDINGS: Brain: No intracranial hemorrhage, mass effect, or midline shift. No hydrocephalus. The basilar cisterns are patent. 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 No evidence of territorial infarct or acute ischemia. No extra-axial or intracranial fluid collection. Vascular: Atherosclerosis of skullbase vasculature without hyperdense vessel or abnormal calcification. Skull: No fracture or suspicious lesion. Sinuses/Orbits: Paranasal sinuses and mastoid air cells are clear. The visualized orbits are unremarkable. Other: None. IMPRESSION: No acute intracranial abnormality. Electronically Signed   By: Andrea Gasman M.D.   On: 06/05/2024 16:35   DG Chest Port 1 View Result  Date: 06/05/2024 EXAM: 1 VIEW XRAY OF THE CHEST 06/05/2024 02:13:24 PM COMPARISON: 11/05/2023 CLINICAL HISTORY: weakness. weakness, fall and hyperglycemia. Pt reports she has been weak recently and has been falling daily. PT cbg in route was 534. FINDINGS: LUNGS AND PLEURA: No focal pulmonary opacity. No pulmonary edema. No pleural effusion. No pneumothorax. HEART AND MEDIASTINUM: No acute abnormality of the cardiac and mediastinal silhouettes. BONES AND SOFT TISSUES: No acute osseous abnormality. IMPRESSION: 1. No acute cardiopulmonary abnormality. Electronically signed by: Lynwood Seip MD 06/05/2024 02:18 PM EDT RP Workstation: [id number]     Procedures   Medications Ordered in the ED  insulin  aspart (novoLOG ) injection 10 Units (has no administration in time range)  magnesium  sulfate IVPB 2 g 50 mL (has no administration in time range)  piperacillin -tazobactam (ZOSYN ) IVPB 3.375 g (0 g Intravenous Stopped 06/05/24 1704)  sodium chloride  0.9 % bolus 1,000 mL (0 mLs Intravenous Stopped 06/05/24 1711)                                    Medical Decision Making Amount and/or Complexity of Data Reviewed Labs: ordered. Radiology: ordered.  Risk Prescription drug management. Decision regarding hospitalization.   This patient presents to the ED for concern of weakness, multiple falls, back pain and lower abdominal pain, this involves an extensive number of treatment options, and is a complaint that carries with it a high risk of complications and morbidity.  The differential diagnosis includes acute kidney injury, electrolyte derangement, urinary tract infection, pneumonia, hyperglycemia, DKA, HHS   Co morbidities that complicate the patient evaluation  Atrial fibrillation, hypertension, diabetes   Additional history obtained:  Additional history obtained from medical records External records from outside source obtained and reviewed including medical records   Lab Tests:  I  Ordered, and personally interpreted labs.  The pertinent results include: No leukocytosis, anemia at baseline, hyponatremia, hyperglycemia, creatinine at baseline, normal liver function, negative lactic acid, urinalysis with positive nitrite, large leukocytes, hypomagnesemia   Imaging Studies ordered:  I ordered imaging studies including CT scan head, CT abdomen pelvis, chest  x-ray I independently visualized and interpreted imaging which showed no acute intracranial pathology, bilateral hydronephrosis and bladder outlet obstruction, no acute cardiopulmonary process I agree with the radiologist interpretation   Cardiac Monitoring: / EKG:  The patient was maintained on a cardiac monitor.  I personally viewed and interpreted the cardiac monitored which showed an underlying rhythm of: Normal sinus rhythm, no ST/T wave changes, no ischemic changes, no STEMI   Consultations Obtained:  I requested consultation with the hospitalist,  and discussed lab and imaging findings as well as pertinent plan - they recommend: Admission   Problem List / ED Course / Critical interventions / Medication management  Patient is doing well at this time and does remain stable.  Discussed with patient we will plan for admission to hospitalist service given her ongoing urinary tract infection with multidrug-resistant Klebsiella.  She was given dose of Zosyn  which is consistent with sensitivities on her urine culture.  CT scan was consistent with most likely a pyelonephritis and bladder L obstruction.  Foley catheter was placed.  No other acute surgical process was noted on CT scan of abdomen and pelvis.  She does not meet criteria for sepsis at this time.  She has been given dose of insulin  for her glucose.  She does not meet criteria for DKA or HHS at this time.  Have discussed patient case with Dr. FORBES Carwin with the hospitalist service who has excepted for admission. I ordered medication including IV fluids, morphine ,  Zofran , insulin , Zosyn  for urinary tract infection, dehydration Reevaluation of the patient after these medicines showed that the patient improved I have reviewed the patients home medicines and have made adjustments as needed   Social Determinants of Health:  None   Test / Admission - Considered:  Admission     Final diagnoses:  None    ED Discharge Orders     None          Daralene Lonni JONETTA DEVONNA 06/05/24 ZEB    Melvenia Motto, MD 06/11/24 2246

## 2024-06-05 NOTE — ED Notes (Signed)
 Abx and fluids delayed due to obtaining blood cultures

## 2024-06-05 NOTE — ED Triage Notes (Signed)
 Pt bib [facility] w/ c/o weakness, fall and hyperglycemia. Pt reports she has been weak recently and has been falling daily. PT cbg in route was 534. Pt is alert and oriented X 4

## 2024-06-06 ENCOUNTER — Inpatient Hospital Stay (HOSPITAL_COMMUNITY)

## 2024-06-06 DIAGNOSIS — N390 Urinary tract infection, site not specified: Secondary | ICD-10-CM | POA: Diagnosis not present

## 2024-06-06 LAB — BASIC METABOLIC PANEL WITH GFR
Anion gap: 7 (ref 5–15)
BUN: 11 mg/dL (ref 8–23)
CO2: 28 mmol/L (ref 22–32)
Calcium: 9 mg/dL (ref 8.9–10.3)
Chloride: 98 mmol/L (ref 98–111)
Creatinine, Ser: 1.1 mg/dL — ABNORMAL HIGH (ref 0.44–1.00)
GFR, Estimated: 55 mL/min — ABNORMAL LOW (ref >60)
Glucose, Bld: 283 mg/dL — ABNORMAL HIGH (ref 70–99)
Potassium: 4.3 mmol/L (ref 3.5–5.1)
Sodium: 133 mmol/L — ABNORMAL LOW (ref 135–145)

## 2024-06-06 LAB — GLUCOSE, CAPILLARY
Glucose-Capillary: 322 mg/dL — ABNORMAL HIGH (ref 70–99)
Glucose-Capillary: 340 mg/dL — ABNORMAL HIGH (ref 70–99)
Glucose-Capillary: 290 mg/dL — ABNORMAL HIGH (ref 70–99)
Glucose-Capillary: 79 mg/dL (ref 70–99)
Glucose-Capillary: 80 mg/dL (ref 70–99)
Glucose-Capillary: 178 mg/dL — ABNORMAL HIGH (ref 70–99)

## 2024-06-06 LAB — CBC
HCT: 23.8 % — ABNORMAL LOW (ref 36.0–46.0)
Hemoglobin: 7 g/dL — ABNORMAL LOW (ref 12.0–15.0)
MCH: 24.1 pg — ABNORMAL LOW (ref 26.0–34.0)
MCHC: 29.4 g/dL — ABNORMAL LOW (ref 30.0–36.0)
MCV: 81.8 fL (ref 80.0–100.0)
Platelets: 307 K/uL (ref 150–400)
RBC: 2.91 MIL/uL — ABNORMAL LOW (ref 3.87–5.11)
RDW: 19 % — ABNORMAL HIGH (ref 11.5–15.5)
WBC: 9.2 K/uL (ref 4.0–10.5)
nRBC: 0 % (ref 0.0–0.2)

## 2024-06-06 MED ORDER — VANCOMYCIN HCL 125 MG PO CAPS
125.0000 mg | ORAL_CAPSULE | Freq: Every day | ORAL | Status: DC
  Administered 2024-06-06 – 2024-06-08 (×3): 125 mg via ORAL
  Filled 2024-06-06 (×3): qty 1

## 2024-06-06 MED ORDER — VANCOMYCIN HCL 125 MG PO CAPS: 125.0000 mg | ORAL_CAPSULE | Freq: Four times a day (QID) | ORAL | Status: DC

## 2024-06-06 MED ORDER — SODIUM CHLORIDE 0.9 % IV SOLN
1.0000 g | Freq: Three times a day (TID) | INTRAVENOUS | Status: DC
  Administered 2024-06-06 – 2024-06-07 (×3): 1 g via INTRAVENOUS
  Filled 2024-06-06 (×3): qty 20

## 2024-06-06 NOTE — Progress Notes (Signed)
 PROGRESS NOTE    Amy Livingston  FMW:[id number] DOB: 1956/04/30 DOA: 06/05/2024 PCP: Edman Meade PEDLAR, FNP   Brief Narrative:    Amy Livingston is a 68 y.o. female with medical history significant for autonomic dysfunction, diabetes mellitus, hypertension, atrial fibrillation. Patient presented to the ED with complaints of several falls, generalized weakness.  EMS reports blood glucose of 534.  Patient reports she has been on treatment for her UTI with antibiotics, her last dose was yesterday, she does not remember the name of the medication.  She was in the ED 10/23, was given a dose of ceftriaxone , to complete a course of Keflex .   Recent hospitalization 10/6 to 10/9 for urinary tract infection-cultures grew Klebsiella.  With electrolyte abnormalities.  Initially started on ertapenem , due to history of ESBL infection in the past, but was transition over to Keflex  with culture showing multidrug sensitive Klebsiella.  She also had persistent diarrhea, with history of C. difficile infection, she had rectal tube placed.  Was treated with oral vancomycin .   ED Course: Temperature 98.2.  Heart rate 95-103.  Respirate rate 17-18.  Blood pressure systolic 107-188.  O2 sats greater 97% on room air. CBG 456.  Lactic acid 1.1. UA with positive nitrites and large leukocytes many bacteria. Head CT, chest x-ray negative for acute abnormality. CT renal stone study-bilateral hydroureteronephrosis, progressed on the left and new on the right, diffuse bilateral perinephric edema, moderate bladder distention and bladder wall thickening.  Findings suspicious for bladder outlet obstruction. IV Zosyn  started. 1 L bolus given.   Assessment & Plan:   Principal Problem:   Complicated UTI (urinary tract infection) Active Problems:   Hyponatremia   Hypomagnesemia   Bladder outlet obstruction   Type 2 diabetes mellitus with hyperglycemia (HCC)   PAF (paroxysmal atrial fibrillation) (HCC)   Essential  hypertension  Patient admitted for complicated UTI in the setting of bladder outlet obstruction  Complicated urinary tract infection-with bilateral hydro ureteric nephrosis, bladder outlet obstruction, pyelonephritis, last urine cultures 05/31/2024 grew ESBL Klebsiella.  Afebrile.  WBC 8.9.  Lactic acid 1.1.  Rules out for sepsis.  UA suggestive of UTI with positive nitrites large leukocytes and many bacteria.  - Continue meropenem , follow urine, blood cultures.  C diff prophylaxis with vancomycin  given previous history. - Continue Foley catheter.  Patient may need to be discharged on Foley catheter - Outpatient follow-up with urology v inpt  Bladder outlet obstruction-patient also reports fecal incontinence x 1x month, multiple falls from bilateral lower extremities buckling when bearing weight,-ongoing for about 10 months.  Reports chronic unchanged back pain, status post multiple surgeries -per patient 7 back surgeries.  - MRI thoracolumbar spine  -Foley catheter   Multiple falls, chronic back pain - Follow-up MRI   Paroxysmal atrial fibrillation-rate controlled and on anticoagulation with Eliquis  - Resume Eliquis    Hypomagnesemia, hyponatremia sodium 130, magnesium  1.5 on presentation.  Sodium 133 today, will recheck magnesium   Chronic anemia: Hemoglobin 7.0.  Hemoglobin has been between 7-8 recently.  Will monitor daily.  Transfuse if less than 7 or symptomatic.   Hypertension-stable. Not on medications.   Diabetes mellitus with hyperglycemia-uncontrolled.  A1c 8.9. - SSI- M - Resume NPH 30 units twice daily - Hold Mounjaro    Anxiety disorder - Resume buspirone , Zoloft    DVT prophylaxis: Lovenox Code Status: FULL Family Communication: none at bedside Disposition Plan: ~ 2 days Consults called: None Admission status: Inpt med surg I certify that at the point of admission it is my clinical judgment  that the patient will require inpatient hospital care spanning beyond 2  midnights from the point of admission due to high intensity of service, high risk for further deterioration and high frequency of surveillance required.       Subjective:  Patient seen and examined at the bedside.  She denies abdominal pain today.  Reports of right leg pain.  Vital signs are stable.  She is afebrile.  Denies nausea or vomiting.  Overall feels better.  Tmax 99.5 F noted.  Objective: Vitals:   06/05/24 2059 06/05/24 2102 06/06/24 0113 06/06/24 0408  BP: (!) 115/51  (!) 105/51 (!) 123/57  Pulse: 95  92 100  Resp: 18  18 18   Temp: 98 F (36.7 C)  99.1 F (37.3 C) 99.5 F (37.5 C)  TempSrc: Oral  Oral Oral  SpO2: 99%  97% 97%  Weight:  80.9 kg    Height:  5' 6 (1.676 m)      Intake/Output Summary (Last 24 hours) at 06/06/2024 1142 Last data filed at 06/06/2024 0600 Gross per 24 hour  Intake 1460.35 ml  Output 1950 ml  Net -489.65 ml   Filed Weights   06/05/24 2102  Weight: 80.9 kg    Examination:  General: Alert, oriented not in any acute distress Chest: Clear bilaterally CVs: S1, S2, no murmur, regular rhythm Abdomen: Soft, nontender Extremities: Trace edema bilaterally uro: Foley/tubing with cloudy urine   Data Reviewed: I have personally reviewed following labs and imaging studies  CBC: Recent Labs  Lab 05/31/24 1355 06/05/24 1455 06/06/24 0456  WBC 7.8 8.9 9.2  NEUTROABS 4.8 6.2  --   HGB 7.9* 7.2* 7.0*  HCT 26.4* 23.9* 23.8*  MCV 81.2 82.1 81.8  PLT 310 340 307   Basic Metabolic Panel: Recent Labs  Lab 05/31/24 1355 06/05/24 1455 06/06/24 0456  NA 132* 130* 133*  K 5.0 4.3 4.3  CL 95* 95* 98  CO2 25 26 28   GLUCOSE 470* 456* 283*  BUN 18 13 11   CREATININE 1.29* 1.21* 1.10*  CALCIUM  8.7* 9.4 9.0  MG  --  1.5*  --    GFR: Estimated Creatinine Clearance: 53.2 mL/min (A) (by C-G formula based on SCr of 1.1 mg/dL (H)). Liver Function Tests: Recent Labs  Lab 05/31/24 1355 06/05/24 1455  AST 11* <10*  ALT 7 <5  ALKPHOS  174* 151*  BILITOT 0.4 0.3  PROT 7.0 6.5  ALBUMIN 3.0* 2.9*   No results for input(s): LIPASE, AMYLASE in the last 168 hours. No results for input(s): AMMONIA in the last 168 hours. Coagulation Profile: No results for input(s): INR, PROTIME in the last 168 hours. Cardiac Enzymes: No results for input(s): CKTOTAL, CKMB, CKMBINDEX, TROPONINI in the last 168 hours. BNP (last 3 results) No results for input(s): PROBNP in the last 8760 hours. HbA1C: No results for input(s): HGBA1C in the last 72 hours. CBG: Recent Labs  Lab 06/05/24 1342 06/05/24 2219 06/06/24 0731 06/06/24 0901 06/06/24 1111  GLUCAP 385* 334* 322* 340* 290*   Lipid Profile: No results for input(s): CHOL, HDL, LDLCALC, TRIG, CHOLHDL, LDLDIRECT in the last 72 hours. Thyroid  Function Tests: No results for input(s): TSH, T4TOTAL, FREET4, T3FREE, THYROIDAB in the last 72 hours. Anemia Panel: No results for input(s): VITAMINB12, FOLATE, FERRITIN, TIBC, IRON, RETICCTPCT in the last 72 hours. Sepsis Labs: Recent Labs  Lab 06/05/24 1455  LATICACIDVEN 1.1    Recent Results (from the past 240 hours)  Urine Culture     Status: Abnormal  Collection Time: 05/31/24  4:32 PM   Specimen: Urine, Random  Result Value Ref Range Status   Specimen Description   Final    URINE, RANDOM Performed at [facility], [address], [place], [place] [address]    Special Requests   Final    NONE Reflexed from [id number] Performed at [facility], [address], [place], [place] [address]    Culture (A)  Final    >=100,000 COLONIES/mL KLEBSIELLA PNEUMONIAE Confirmed Extended Spectrum Beta-Lactamase Producer (ESBL).  In bloodstream infections from ESBL organisms, carbapenems are preferred over piperacillin /tazobactam. They are shown to have a lower risk of mortality.    Report Status 06/02/2024 FINAL  Final   Organism ID, Bacteria KLEBSIELLA PNEUMONIAE (A)  Final       Susceptibility   Klebsiella pneumoniae - MIC*    AMPICILLIN >=32 RESISTANT Resistant     CEFAZOLIN  (URINE) Value in next row Resistant      >=32 RESISTANTThis is a modified FDA-approved test that has been validated and its performance characteristics determined by the reporting laboratory.  This laboratory is certified under the Clinical Laboratory Improvement Amendments CLIA as qualified to perform high complexity clinical laboratory testing.    CEFEPIME  Value in next row Resistant      >=32 RESISTANTThis is a modified FDA-approved test that has been validated and its performance characteristics determined by the reporting laboratory.  This laboratory is certified under the Clinical Laboratory Improvement Amendments CLIA as qualified to perform high complexity clinical laboratory testing.    ERTAPENEM  Value in next row Sensitive      >=32 RESISTANTThis is a modified FDA-approved test that has been validated and its performance characteristics determined by the reporting laboratory.  This laboratory is certified under the Clinical Laboratory Improvement Amendments CLIA as qualified to perform high complexity clinical laboratory testing.    CEFTRIAXONE  Value in next row Resistant      >=32 RESISTANTThis is a modified FDA-approved test that has been validated and its performance characteristics determined by the reporting laboratory.  This laboratory is certified under the Clinical Laboratory Improvement Amendments CLIA as qualified to perform high complexity clinical laboratory testing.    CIPROFLOXACIN Value in next row Resistant      >=32 RESISTANTThis is a modified FDA-approved test that has been validated and its performance characteristics determined by the reporting laboratory.  This laboratory is certified under the Clinical Laboratory Improvement Amendments CLIA as qualified to perform high complexity clinical laboratory testing.    GENTAMICIN Value in next row Resistant      >=32 RESISTANTThis  is a modified FDA-approved test that has been validated and its performance characteristics determined by the reporting laboratory.  This laboratory is certified under the Clinical Laboratory Improvement Amendments CLIA as qualified to perform high complexity clinical laboratory testing.    NITROFURANTOIN  Value in next row Resistant      >=32 RESISTANTThis is a modified FDA-approved test that has been validated and its performance characteristics determined by the reporting laboratory.  This laboratory is certified under the Clinical Laboratory Improvement Amendments CLIA as qualified to perform high complexity clinical laboratory testing.    TRIMETH/SULFA Value in next row Resistant      >=32 RESISTANTThis is a modified FDA-approved test that has been validated and its performance characteristics determined by the reporting laboratory.  This laboratory is certified under the Clinical Laboratory Improvement Amendments CLIA as qualified to perform high complexity clinical laboratory testing.    AMPICILLIN/SULBACTAM Value in next row Resistant      >=  32 RESISTANTThis is a modified FDA-approved test that has been validated and its performance characteristics determined by the reporting laboratory.  This laboratory is certified under the Clinical Laboratory Improvement Amendments CLIA as qualified to perform high complexity clinical laboratory testing.    PIP/TAZO Value in next row Sensitive      16 SENSITIVEThis is a modified FDA-approved test that has been validated and its performance characteristics determined by the reporting laboratory.  This laboratory is certified under the Clinical Laboratory Improvement Amendments CLIA as qualified to perform high complexity clinical laboratory testing.    MEROPENEM  Value in next row Sensitive      16 SENSITIVEThis is a modified FDA-approved test that has been validated and its performance characteristics determined by the reporting laboratory.  This laboratory is  certified under the Clinical Laboratory Improvement Amendments CLIA as qualified to perform high complexity clinical laboratory testing.    * >=100,000 COLONIES/mL KLEBSIELLA PNEUMONIAE  Culture, blood (routine x 2)     Status: None (Preliminary result)   Collection Time: 06/05/24  2:55 PM   Specimen: BLOOD  Result Value Ref Range Status   Specimen Description BLOOD RIGHT ANTECUBITAL  Final   Special Requests   Final    BOTTLES DRAWN AEROBIC AND ANAEROBIC Blood Culture adequate volume   Culture   Final    NO GROWTH < 24 HOURS Performed at [facility], [address], [place], [place] [address]    Report Status PENDING  Incomplete  Culture, blood (routine x 2)     Status: None (Preliminary result)   Collection Time: 06/05/24  2:55 PM   Specimen: BLOOD  Result Value Ref Range Status   Specimen Description BLOOD RIGHT ANTECUBITAL  Final   Special Requests   Final    BOTTLES DRAWN AEROBIC ONLY Blood Culture results may not be optimal due to an inadequate volume of blood received in culture bottles   Culture   Final    NO GROWTH < 24 HOURS Performed at [facility], [address], [place], [place] [address]    Report Status PENDING  Incomplete         Radiology Studies: MR LUMBAR SPINE WO CONTRAST Result Date: 06/06/2024 EXAM: MRI LUMBAR SPINE 06/06/2024 09:23:47 AM TECHNIQUE: Multiplanar multisequence MRI of the lumbar spine was performed without the administration of intravenous contrast. COMPARISON: Lumbar radiographs 11/07/2019. CT abdomen and pelvis 06/05/2024. Lumbar MRI 04/01/2016. CLINICAL HISTORY: 68 year old female. Urinary retention, fecal incontinence, bilateral lower extremity weakness. Chronic back pain and back surgeries. FINDINGS: BONES AND ALIGNMENT: Normal lumbar segmentation. Chronic lumbar fusion beginning at L3, with L3-L4 posterior and interbody hardware still in place, evidence of solid arthrodesis on CT 06/05/2024 through to the sacrum. Stable vertebral height  and alignment since the previous MRI. Normal background bone marrow signal. Mild hardware susceptibility artifact. No marrow edema. Intact visible sacrum. SPINAL CORD: The conus terminates normally at T12. SOFT TISSUES: No paraspinal mass. Visible abdominal viscera stable from details of CT abdomen and pelvis 06/05/2024. Mild lower thoracic disc bulging through T12-L1. No lower thoracic spinal stenosis. L1-L2: Mild disc bulging. Mild to moderate facet and ligamentum flavum hypertrophy. No stenosis. L2-L3: Adjacent segment disc space loss, circumferential disc bulge, broad based posterior component. Moderate facet and ligamentum flavum hypertrophy. Epidural lipomatosis has regressed since the previous MRI and spinal canal patency is improved. Borderline to mild spinal stenosis. Mild left and moderate right L2 neural foraminal stenosis. The foraminal narrowing has not significantly changed. L3-L4 THROUGH L5-S1: Chronic decompression  and fusion L3-L4 through L5-S1 with solid arthrodesis. IMPRESSION: 1. Previous lumbar fusion L3 through sacrum without adverse features. 2. Adjacent segment degeneration at L2-L3 with borderline spinal stenosis improved from 2017 MRI. Stable mild left and moderate right L2 foraminal stenosis. Electronically signed by: Helayne Hurst MD 06/06/2024 10:03 AM EDT RP Workstation: [id number]   CT Renal Stone Study Result Date: 06/05/2024 CLINICAL DATA:  Provided history: Abdominal/flank pain, stone suspected Patient reports weakness and multiple falls. EXAM: CT ABDOMEN AND PELVIS WITHOUT CONTRAST TECHNIQUE: Multidetector CT imaging of the abdomen and pelvis was performed following the standard protocol without IV contrast. RADIATION DOSE REDUCTION: This exam was performed according to the departmental dose-optimization program which includes automated exposure control, adjustment of the mA and/or kV according to patient size and/or use of iterative reconstruction technique. COMPARISON:  CT  10/08/2023 FINDINGS: Lower chest: The lung bases are clear. Hepatobiliary: No focal liver abnormality is seen. Punctate granuloma in the inferior right lobe of the liver status post cholecystectomy. No biliary dilatation. Pancreas: No ductal dilatation or inflammation. Spleen: Normal in size without focal abnormality. Adrenals/Urinary Tract: Normal adrenal glands. Bilateral hydroureteronephrosis. On the left this has progressed, and new hydronephrosis on the right. The left renal collecting system is slightly more dilated than the right. Ureters are dilated and tortuous to the bladder insertion. There is diffuse bilateral perinephric edema. Possible punctate nonobstructing stone in the lower left kidney. Moderate bladder distension with mild trabeculation and wall thickening. 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 Stomach/Bowel: The stomach is nondistended. No bowel obstruction or inflammation. Normal appendix. Moderate colonic stool burden. Distal colonic diverticulosis without focal diverticulitis. Vascular/Lymphatic: Aortic and branch atherosclerosis. There is retroperitoneal adenopathy at the level of the kidneys, greater on the left. Representative lymph node measures 12 mm short axis series 11, image 36. Reproductive: Status post hysterectomy. No adnexal masses. Other: Retroperitoneal and perinephric edema. No significant ascites. No free air. Musculoskeletal: Degenerative and postsurgical change in the spine. There are no acute or suspicious osseous abnormalities. IMPRESSION: 1. Bilateral hydroureteronephrosis, progressed on the left and new on the right. There is diffuse bilateral perinephric edema. Moderate bladder distension with mild trabeculation and wall thickening. 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 Findings are suspicious for bladder outlet obstruction. 2. Retroperitoneal and perinephric edema may be due to obstructive uropathy, however recommend correlation with urinalysis to exclude urinary tract infection.  3. Retroperitoneal adenopathy at the level of the kidneys, greater on the left, indeterminate in etiology. 4. Colonic diverticulosis without focal diverticulitis. Aortic Atherosclerosis (ICD10-I70.0). Electronically Signed   By: Andrea Gasman M.D.   On: 06/05/2024 16:52   CT Head Wo Contrast Result Date: 06/05/2024 CLINICAL DATA:  Syncope/presyncope, cerebrovascular cause suspected EXAM: CT HEAD WITHOUT CONTRAST TECHNIQUE: Contiguous axial images were obtained from the base of the skull through the vertex without intravenous contrast. RADIATION DOSE REDUCTION: This exam was performed according to the departmental dose-optimization program which includes automated exposure control, adjustment of the mA and/or kV according to patient size and/or use of iterative reconstruction technique. COMPARISON:  10/08/2023 FINDINGS: Brain: No intracranial hemorrhage, mass effect, or midline shift. No hydrocephalus. The basilar cisterns are patent. 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 No evidence of territorial infarct or acute ischemia. No extra-axial or intracranial fluid collection. Vascular: Atherosclerosis of skullbase vasculature without hyperdense vessel or abnormal calcification. Skull: No fracture or suspicious lesion. Sinuses/Orbits: Paranasal sinuses and mastoid air cells are clear. The visualized orbits are unremarkable. Other: None. IMPRESSION: No acute intracranial abnormality. Electronically Signed  By: Andrea Gasman M.D.   On: 06/05/2024 16:35   DG Chest Port 1 View Result Date: 06/05/2024 EXAM: 1 VIEW XRAY OF THE CHEST 06/05/2024 02:13:24 PM COMPARISON: 11/05/2023 CLINICAL HISTORY: weakness. weakness, fall and hyperglycemia. Pt reports she has been weak recently and has been falling daily. PT cbg in route was 534. FINDINGS: LUNGS AND PLEURA: No focal pulmonary opacity. No pulmonary edema. No pleural effusion. No pneumothorax. HEART AND MEDIASTINUM: No acute  abnormality of the cardiac and mediastinal silhouettes. BONES AND SOFT TISSUES: No acute osseous abnormality. IMPRESSION: 1. No acute cardiopulmonary abnormality. Electronically signed by: Lynwood Seip MD 06/05/2024 02:18 PM EDT RP Workstation: [id number]        Scheduled Meds:  apixaban   5 mg Oral BID   atorvastatin   80 mg Oral Daily   busPIRone   10 mg Oral BID   Chlorhexidine  Gluconate Cloth  6 each Topical Daily   insulin  aspart  0-15 Units Subcutaneous TID WC   insulin  aspart  0-5 Units Subcutaneous QHS   insulin  aspart protamine- aspart  30 Units Subcutaneous BID WC   sertraline   50 mg Oral Daily   traZODone   100 mg Oral QHS   vancomycin   125 mg Oral Daily   Continuous Infusions:  sodium chloride  75 mL/hr at 06/05/24 2248   meropenem  (MERREM ) IV            Shakeeta Godette, MD [facility] 06/06/2024, 11:42 AM

## 2024-06-06 NOTE — [place] FL2 (Signed)
 [place]  MEDICAID FL2 LEVEL OF CARE FORM     IDENTIFICATION  Patient Name: Amy Livingston Birthdate: Jan 02, 1956 Sex: female Admission Date (Current Location): 06/05/2024  [facility] and [place] Number:  [employer] and Address:  [facility],  [address] [place] [address]      Provider Number: [id number]  Attending Physician Name and Address:  Mcarthur Pick, MD  Relative Name and Phone Number:       Current Level of Care: Hospital Recommended Level of Care: Skilled Nursing Facility Prior Approval Number:    Date Approved/Denied:   PASRR Number: [id number]  Discharge Plan: SNF    Current Diagnoses: Patient Active Problem List   Diagnosis Date Noted   Complicated UTI (urinary tract infection) 06/05/2024   Bladder outlet obstruction 06/05/2024   UTI (urinary tract infection) 05/14/2024   Weight loss due to medication 05/14/2024   Vulvar irritation 04/02/2024   Incomplete bladder emptying 04/02/2024   Vaginal atrophy 04/02/2024   GERD (gastroesophageal reflux disease) 03/22/2024   Hypomagnesemia 03/11/2024   Severe sepsis (HCC) 03/11/2024   AKI (acute kidney injury) 03/11/2024   Chronic eczematous otitis externa of both ears 02/16/2024   Autonomic dysfunction 01/17/2024   Personal history of fall 12/08/2023   Dizziness 12/08/2023   Insomnia 11/20/2023   Serous otitis media 11/19/2023   Recurrent UTI 11/05/2023   Bacteremia due to other bacteria 10/13/2023   Paroxysmal atrial fibrillation (HCC) 10/09/2023   Anxiety and depression 10/09/2023   Syncope and collapse 10/09/2023   BRBPR (bright red blood per rectum) 10/09/2023   Enteritis due to Clostridium difficile 10/09/2023   Diarrhea of infectious origin 10/09/2023   Sepsis due to gram-negative UTI (HCC) 10/08/2023   Hypokalemia 10/08/2023   Hyponatremia 10/08/2023   Uncontrolled type 2 diabetes mellitus with hyperglycemia, with long-term current use of insulin  (HCC) 10/08/2023    Essential hypertension 10/08/2023   Peripheral neuropathy 10/08/2023   Overactive bladder 06/17/2023   Generalized anxiety disorder 06/17/2023   Type 2 diabetes mellitus with diabetic neuropathy, unspecified (HCC) 06/17/2023   Hypertension 06/01/2023   Urinary incontinence 06/01/2023   Bowel incontinence 06/01/2023   Generalized weakness 06/01/2023   Encounter for immunization 06/01/2023   Diabetes 1.5, 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   PAF (paroxysmal atrial fibrillation) (HCC) 08/14/2020   Dyslipidemia 07/01/2020   Type 2 diabetes mellitus with hyperglycemia (HCC) 07/01/2020   Body mass index (BMI) 40.0-44.9, adult (HCC) 11/07/2019   Elevated blood-pressure reading, without diagnosis of hypertension 11/07/2019   Spondylolisthesis, lumbar region 07/18/2018   Cervical spondylosis with myelopathy 01/13/2016   Cervical disc disorder with myelopathy 12/04/2015   Neck pain 12/04/2015    Orientation RESPIRATION BLADDER Height & Weight     Self, Time, Situation, Place  Normal Incontinent Weight: 178 lb 5.6 oz (80.9 kg) Height:  5' 6 (167.6 cm)  BEHAVIORAL SYMPTOMS/MOOD NEUROLOGICAL BOWEL NUTRITION STATUS      Incontinent Diet (heart healthy/carb modified)  AMBULATORY STATUS COMMUNICATION OF NEEDS Skin   Limited Assist Verbally PU Stage and Appropriate Care (Pressure Injury Sacrum Stage 1) PU Stage 1 Dressing: No Dressing                     Personal Care Assistance Level of Assistance  Bathing, Feeding, Dressing Bathing Assistance: Limited assistance Feeding assistance: Independent Dressing Assistance: Limited assistance     Functional Limitations Info  Sight, Hearing, Speech Sight Info: Impaired (eyeglasses) Hearing Info: Adequate Speech Info: Adequate    SPECIAL CARE  FACTORS FREQUENCY  PT (By licensed PT), OT (By licensed OT)     PT Frequency: 5x/wk OT Frequency: 5x/wk            Contractures Contractures Info: Not present    Additional Factors Info   Code Status, Allergies, Psychotropic Code Status Info: FULL Allergies Info: elemental sulfu and metformin  and related Psychotropic Info: Buspar , Zoloft , Trazodone          Current Medications (06/06/2024):  This is the current hospital active medication list Current Facility-Administered Medications  Medication Dose Route Frequency Provider Last Rate Last Admin   0.9 %  sodium chloride  infusion   Intravenous Continuous Emokpae, Ejiroghene E, MD 75 mL/hr at 06/05/24 2248 New Bag at 06/05/24 2248   acetaminophen  (TYLENOL ) tablet 650 mg  650 mg Oral Q6H PRN Emokpae, Ejiroghene E, MD       Or   acetaminophen  (TYLENOL ) suppository 650 mg  650 mg Rectal Q6H PRN Emokpae, Ejiroghene E, MD       apixaban  (ELIQUIS ) tablet 5 mg  5 mg Oral BID Emokpae, Ejiroghene E, MD   5 mg at 06/06/24 0857   atorvastatin  (LIPITOR) tablet 80 mg  80 mg Oral Daily Emokpae, Ejiroghene E, MD   80 mg at 06/06/24 0857   busPIRone  (BUSPAR ) tablet 10 mg  10 mg Oral BID Emokpae, Ejiroghene E, MD       Chlorhexidine  Gluconate Cloth 2 % PADS 6 each  6 each Topical Daily Emokpae, Ejiroghene E, MD   6 each at 06/06/24 0858   insulin  aspart (novoLOG ) injection 0-15 Units  0-15 Units Subcutaneous TID WC Emokpae, Ejiroghene E, MD   8 Units at 06/06/24 1147   insulin  aspart (novoLOG ) injection 0-5 Units  0-5 Units Subcutaneous QHS Emokpae, Ejiroghene E, MD   4 Units at 06/05/24 2246   insulin  aspart protamine- aspart (NOVOLOG  MIX 70/30) injection 30 Units  30 Units Subcutaneous BID WC Emokpae, Ejiroghene E, MD   30 Units at 06/06/24 0902   meropenem  (MERREM ) 1 g in sodium chloride  0.9 % 100 mL IVPB  1 g Intravenous Q8H Sigdel, Santosh, MD       ondansetron  (ZOFRAN ) tablet 4 mg  4 mg Oral Q6H PRN Emokpae, Ejiroghene E, MD       Or   ondansetron  (ZOFRAN ) injection 4 mg  4 mg Intravenous Q6H PRN Emokpae, Ejiroghene E, MD       oxyCODONE -acetaminophen  (PERCOCET/ROXICET) 5-325 MG per tablet 1 tablet  1 tablet Oral Q8H PRN Emokpae,  Ejiroghene E, MD   1 tablet at 06/06/24 1001   And   oxyCODONE  (Oxy IR/ROXICODONE ) immediate release tablet 5 mg  5 mg Oral Q8H PRN Emokpae, Ejiroghene E, MD   5 mg at 06/06/24 1001   polyethylene glycol (MIRALAX  / GLYCOLAX ) packet 17 g  17 g Oral Daily PRN Emokpae, Ejiroghene E, MD       sertraline  (ZOLOFT ) tablet 50 mg  50 mg Oral Daily Emokpae, Ejiroghene E, MD   50 mg at 06/06/24 0857   traZODone  (DESYREL ) tablet 100 mg  100 mg Oral QHS Emokpae, Ejiroghene E, MD   100 mg at 06/05/24 2244   vancomycin  (VANCOCIN ) capsule 125 mg  125 mg Oral Daily Sinclair, Emily S, RPH   125 mg at 06/06/24 1002     Discharge Medications: Please see discharge summary for a list of discharge medications.  Relevant Imaging Results:  Relevant Lab Results:   Additional Information SSN: [id number]  Hoy DELENA Bigness, LCSW

## 2024-06-06 NOTE — Evaluation (Signed)
 Physical Therapy Evaluation Patient Details Name: Amy Livingston MRN: [id number] DOB: 02-Jun-1956 Today's Date: 06/06/2024  History of Present Illness  Amy Livingston is a 68 y.o. female with medical history significant for autonomic dysfunction, diabetes mellitus, hypertension, atrial fibrillation.  Patient presented to the ED with complaints of several falls, generalized weakness.  EMS reports blood glucose of 534.  Patient reports she has been on treatment for her UTI with antibiotics, her last dose was yesterday, she does not remember the name of the medication.  She was in the ED 10/23, was given a dose of ceftriaxone , to complete a course of Keflex .   Clinical Impression  Patient agreeable to PT/OT co-evaluation. Pt reports at baseline, she is mod. independent with household ambulation w/ RW and independent with ADLs, sometimes at seated level but reports frequent falls. On this date, patient is min/CGA for most mobility, using bed features for better ease of bed mobility, and RW during transfer/very short ambulation. Pt limited due to general weakness, dizziness, unsteadiness and fatigue once on her feet. Pt reports dizziness is not new. BP usually on lower end. Tolerates sitting up in chair at end of session, alarm set, and call button within reach. Patient will benefit from continued skilled physical therapy acutely and in recommended venue in order to address current deficits and improve function.       If plan is discharge home, recommend the following: Help with stairs or ramp for entrance;Assistance with cooking/housework;A little help with walking and/or transfers;A little help with bathing/dressing/bathroom;Assist for transportation   Can travel by private vehicle        Equipment Recommendations None recommended by PT  Recommendations for Other Services       Functional Status Assessment Patient has had a recent decline in their functional status and demonstrates the ability to  make significant improvements in function in a reasonable and predictable amount of time.     Precautions / Restrictions Precautions Precautions: Fall Recall of Precautions/Restrictions: Intact Restrictions Weight Bearing Restrictions Per Provider Order: No      Mobility  Bed Mobility Overal bed mobility: Needs Assistance Bed Mobility: Supine to Sit     Supine to sit: Supervision, Contact guard, HOB elevated     General bed mobility comments: HOB slightly elevated as pt has hospital bed, slow labored movement, inc time needed, CGA/supervision for safety    Transfers Overall transfer level: Needs assistance Equipment used: Rolling walker (2 wheels) Transfers: Sit to/from Stand, Bed to chair/wheelchair/BSC Sit to Stand: Min assist, From elevated surface, Contact guard assist   Step pivot transfers: Min assist, Contact guard assist       General transfer comment: min assist due to elevating bed height, use of RW, and for mild dizziness/unsteadiness once standing, pt reports this at baseline    Ambulation/Gait Ambulation/Gait assistance: Contact guard assist, Min assist Gait Distance (Feet): 3 Feet Assistive device: Rolling walker (2 wheels) Gait Pattern/deviations: Step-to pattern, Decreased step length - left, Decreased step length - right, Decreased stride length, Trunk flexed Gait velocity: dec     General Gait Details: Limited to a few side steps at bedside due to pt dizziness and general fatigue. demo slow labored movement and unsteadiness  Stairs            Wheelchair Mobility     Tilt Bed    Modified Rankin (Stroke Patients Only)       Balance Overall balance assessment: Needs assistance Sitting-balance support: Feet supported, No upper extremity supported Sitting balance-Leahy  Scale: Fair Sitting balance - Comments: fair to good seated EOB   Standing balance support: Reliant on assistive device for balance, During functional activity,  Bilateral upper extremity supported Standing balance-Leahy Scale: Poor Standing balance comment: w/ RW             Pertinent Vitals/Pain Pain Assessment Pain Assessment: 0-10 Pain Score: 6  Pain Location: low back and bladder Pain Descriptors / Indicators: Aching, Burning, Sore Pain Intervention(s): Limited activity within patient's tolerance, Monitored during session, Repositioned    Home Living Family/patient expects to be discharged to:: Private residence Living Arrangements: Children Available Help at Discharge: Family;Available 24 hours/day Type of Home: Apartment Home Access: Stairs to enter Entrance Stairs-Rails: Right Entrance Stairs-Number of Steps: 3-4 Alternate Level Stairs-Number of Steps: 13 Home Layout: Two level;Able to live on main level with bedroom/bathroom Home Equipment: Rolling Walker (2 wheels);Cane - single point;Toilet riser;Shower seat;Hospital bed;Wheelchair - manual;Rollator (4 wheels);BSC/3in1 Additional Comments: Pt reports no change in home set up since last admission. Lives with daughter    Prior Function Prior Level of Function : Needs assist;History of Falls (last six months)         Mobility (physical): Bed mobility;Transfers;Gait;Stairs   Mobility Comments: Pt reports mostly as household ambulator with RW, reports multiple falls in last 6 months, 2 falls yesterday ADLs Comments: Assisted IADL's; independent ADL's, sponge bathes sometimes     Extremity/Trunk Assessment   Upper Extremity Assessment Upper Extremity Assessment: Defer to OT evaluation    Lower Extremity Assessment Lower Extremity Assessment: Generalized weakness (ankle DF MMT 4+/5 bilat, hip flexion 4-/5 bilat)    Cervical / Trunk Assessment Cervical / Trunk Assessment: Kyphotic  Communication   Communication Communication: No apparent difficulties    Cognition Arousal: Alert Behavior During Therapy: WFL for tasks assessed/performed   PT - Cognitive  impairments: No apparent impairments   Following commands: Intact       Cueing Cueing Techniques: Verbal cues     General Comments      Exercises     Assessment/Plan    PT Assessment Patient needs continued PT services;All further PT needs can be met in the next venue of care  PT Problem List Decreased strength;Decreased activity tolerance;Decreased balance;Decreased mobility;Pain       PT Treatment Interventions Gait training;DME instruction;Stair training;Functional mobility training;Therapeutic activities;Therapeutic exercise;Balance training;Patient/family education    PT Goals (Current goals can be found in the Care Plan section)  Acute Rehab PT Goals Patient Stated Goal: Return home PT Goal Formulation: With patient Time For Goal Achievement: 06/13/24 Potential to Achieve Goals: Good    Frequency Min 3X/week     Co-evaluation PT/OT/SLP Co-Evaluation/Treatment: Yes Reason for Co-Treatment: To address functional/ADL transfers;For patient/therapist safety PT goals addressed during session: Mobility/safety with mobility OT goals addressed during session: ADL's and self-care       AM-PAC PT 6 Clicks Mobility  Outcome Measure Help needed turning from your back to your side while in a flat bed without using bedrails?: A Little Help needed moving from lying on your back to sitting on the side of a flat bed without using bedrails?: A Little Help needed moving to and from a bed to a chair (including a wheelchair)?: A Little Help needed standing up from a chair using your arms (e.g., wheelchair or bedside chair)?: A Lot Help needed to walk in hospital room?: A Little Help needed climbing 3-5 steps with a railing? : A Lot 6 Click Score: 16    End of Session Equipment Utilized  During Treatment: Gait belt Activity Tolerance: Patient tolerated treatment well;Patient limited by fatigue Patient left: in chair;with call bell/phone within reach;with chair alarm set   PT  Visit Diagnosis: Unsteadiness on feet (R26.81);Muscle weakness (generalized) (M62.81);Repeated falls (R29.6);Other abnormalities of gait and mobility (R26.89);Difficulty in walking, not elsewhere classified (R26.2)    Time: 9075-9054 PT Time Calculation (min) (ACUTE ONLY): 21 min   Charges:   PT Evaluation $PT Eval Low Complexity: 1 Low   PT General Charges $$ ACUTE PT VISIT: 1 Visit         12:19 PM, 06/06/24 Mathhew Buysse Powell-Butler, PT, DPT [place] with [facility]

## 2024-06-06 NOTE — Plan of Care (Signed)
  Problem: Acute Rehab PT Goals(only PT should resolve) Goal: Pt Will Go Supine/Side To Sit Outcome: Progressing Flowsheets (Taken 06/06/2024 1221) Pt will go Supine/Side to Sit: with modified independence Goal: Patient Will Transfer Sit To/From Stand Outcome: Progressing Flowsheets (Taken 06/06/2024 1221) Patient will transfer sit to/from stand: with supervision Goal: Pt Will Transfer Bed To Chair/Chair To Bed Outcome: Progressing Flowsheets (Taken 06/06/2024 1221) Pt will Transfer Bed to Chair/Chair to Bed: with supervision Goal: Pt Will Ambulate Outcome: Progressing Flowsheets (Taken 06/06/2024 1221) Pt will Ambulate:  10 feet  with supervision  with rolling walker Goal: Pt Will Go Up/Down Stairs Outcome: Progressing Flowsheets (Taken 06/06/2024 1221) Pt will Go Up / Down Stairs:  3-5 stairs  with rail(s)  with contact guard assist    12:22 PM, 06/06/24 Judith Demps Powell-Butler, PT, DPT [place] with [facility]

## 2024-06-06 NOTE — TOC Initial Note (Signed)
 Transition of Care [facility]) - Initial/Assessment Note    Patient Details  Name: Amy Livingston MRN: [id number] Date of Birth: 1955-09-04  [facility] CM/SW Contact:    Hoy DELENA Bigness, LCSW Phone Number: 06/06/2024, 12:57 PM  Clinical Narrative:                 Pt from home with daughter. Pt has RW and rollator at home. Pt is active with [facility] services. Pt currently recommended for STR placement. Pt denies having been to SNF in the past. Pt apprehensive about SNF however, agreeable to appease her daughter. Pt prefers placement at [facility] Referral has been sent and currently awaiting bed offer.  Expected Discharge Plan: Skilled Nursing Facility Barriers to Discharge: Continued Medical Work up   Patient Goals and CMS Choice Patient states their goals for this hospitalization and ongoing recovery are:: To get rehab CMS Medicare.gov Compare Post Acute Care list provided to:: Patient Choice offered to / list presented to : Patient [place] ownership interest in [facility].provided to:: Patient    Expected Discharge Plan and Services In-house Referral: Clinical Social Work Discharge Planning Services: NA Post Acute Care Choice: Skilled Nursing Facility Living arrangements for the past 2 months: Apartment                 DME Arranged: N/A DME Agency: NA                  Prior Living Arrangements/Services Living arrangements for the past 2 months: Apartment Lives with:: Adult Children Patient language and need for interpreter reviewed:: Yes Do you feel safe going back to the place where you live?: Yes      Need for Family Participation in Patient Care: No (Comment) Care giver support system in place?: Yes (comment) Current home services: Home PT, DME (RW, rollator, HHPT w/ [facility]) Criminal Activity/Legal Involvement Pertinent to Current Situation/Hospitalization: No - Comment as needed  Activities of Daily Living   ADL Screening  (condition at time of admission) Independently performs ADLs?: No Does the patient have a NEW difficulty with bathing/dressing/toileting/self-feeding that is expected to last >3 days?: Yes (Initiates electronic notice to provider for possible OT consult) Does the patient have a NEW difficulty with getting in/out of bed, walking, or climbing stairs that is expected to last >3 days?: Yes (Initiates electronic notice to provider for possible PT consult) Does the patient have a NEW difficulty with communication that is expected to last >3 days?: No Is the patient deaf or have difficulty hearing?: No Does the patient have difficulty seeing, even when wearing glasses/contacts?: No Does the patient have difficulty concentrating, remembering, or making decisions?: No  Permission Sought/Granted Permission sought to share information with : Facility Industrial/product Designer granted to share information with : Yes, Verbal Permission Granted  Share Information with NAME: Declined family contact  Permission granted to share info w AGENCY: SNF        Emotional Assessment Appearance:: Appears stated age Attitude/Demeanor/Rapport: Engaged Affect (typically observed): Accepting, Pleasant Orientation: : Oriented to Self, Oriented to Place, Oriented to  Time, Oriented to Situation Alcohol / Substance Use: Not Applicable Psych Involvement: No (comment)  Admission diagnosis:  Hypomagnesemia [E83.42] Hyponatremia [E87.1] Weakness [R53.1] Hyperglycemia [R73.9] Complicated UTI (urinary tract infection) [N39.0] Multiple falls [R29.6] Acute pyelonephritis [N10] Patient Active Problem List   Diagnosis Date Noted   Complicated UTI (urinary tract infection) 06/05/2024   Bladder outlet obstruction 06/05/2024   UTI (urinary tract infection)  05/14/2024   Weight loss due to medication 05/14/2024   Vulvar irritation 04/02/2024   Incomplete bladder emptying 04/02/2024   Vaginal atrophy 04/02/2024    GERD (gastroesophageal reflux disease) 03/22/2024   Hypomagnesemia 03/11/2024   Severe sepsis (HCC) 03/11/2024   AKI (acute kidney injury) 03/11/2024   Chronic eczematous otitis externa of both ears 02/16/2024   Autonomic dysfunction 01/17/2024   Personal history of fall 12/08/2023   Dizziness 12/08/2023   Insomnia 11/20/2023   Serous otitis media 11/19/2023   Recurrent UTI 11/05/2023   Bacteremia due to other bacteria 10/13/2023   Paroxysmal atrial fibrillation (HCC) 10/09/2023   Anxiety and depression 10/09/2023   Syncope and collapse 10/09/2023   BRBPR (bright red blood per rectum) 10/09/2023   Enteritis due to Clostridium difficile 10/09/2023   Diarrhea of infectious origin 10/09/2023   Sepsis due to gram-negative UTI (HCC) 10/08/2023   Hypokalemia 10/08/2023   Hyponatremia 10/08/2023   Uncontrolled type 2 diabetes mellitus with hyperglycemia, with long-term current use of insulin  (HCC) 10/08/2023   Essential hypertension 10/08/2023   Peripheral neuropathy 10/08/2023   Overactive bladder 06/17/2023   Generalized anxiety disorder 06/17/2023   Type 2 diabetes mellitus with diabetic neuropathy, unspecified (HCC) 06/17/2023   Hypertension 06/01/2023   Urinary incontinence 06/01/2023   Bowel incontinence 06/01/2023   Generalized weakness 06/01/2023   Encounter for immunization 06/01/2023   Diabetes 1.5, 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   PAF (paroxysmal atrial fibrillation) (HCC) 08/14/2020   Dyslipidemia 07/01/2020   Type 2 diabetes mellitus with hyperglycemia (HCC) 07/01/2020   Body mass index (BMI) 40.0-44.9, adult (HCC) 11/07/2019   Elevated blood-pressure reading, without diagnosis of hypertension 11/07/2019   Spondylolisthesis, lumbar region 07/18/2018   Cervical spondylosis with myelopathy 01/13/2016   Cervical disc disorder with myelopathy 12/04/2015   Neck pain 12/04/2015   PCP:  Edman Meade PEDLAR, FNP Pharmacy:   [facility] [phone] - [place], [place] -  [address] AT [facility] [address] [place] [address] Phone: [phone] Fax: [phone]     Social Drivers of Health (SDOH) Social History: SDOH Screenings   Food Insecurity: Food Insecurity Present (06/05/2024)  Housing: Low Risk  (06/05/2024)  Transportation Needs: No Transportation Needs (06/05/2024)  Utilities: Not At Risk (06/05/2024)  Alcohol Screen: Low Risk  (03/21/2024)  Depression (PHQ2-9): Low Risk  (03/22/2024)  Financial Resource Strain: Medium Risk (03/21/2024)  Physical Activity: Inactive (03/21/2024)  Social Connections: Socially Isolated (06/05/2024)  Stress: Stress Concern Present (03/21/2024)  Tobacco Use: Medium Risk (06/05/2024)  Health Literacy: Adequate Health Literacy (09/28/2023)   SDOH Interventions: Social Connections Interventions: Intervention Not Indicated, Inpatient TOC   Readmission Risk Interventions    06/06/2024   12:55 PM 05/16/2024    1:16 PM 05/15/2024    1:54 PM  Readmission Risk Prevention Plan  Transportation Screening Complete Complete Complete  PCP or Specialist Appt within 5-7 Days Complete    Home Care Screening Complete Complete Complete  Medication Review (RN CM) Complete Complete Complete

## 2024-06-06 NOTE — Plan of Care (Signed)
  Problem: Acute Rehab OT Goals (only OT should resolve) Goal: Pt. Will Perform Grooming Flowsheets (Taken 06/06/2024 1039) Pt Will Perform Grooming:  with supervision  standing Goal: Pt. Will Perform Upper Body Dressing Flowsheets (Taken 06/06/2024 1039) Pt Will Perform Upper Body Dressing:  with modified independence  sitting Goal: Pt. Will Perform Lower Body Dressing Flowsheets (Taken 06/06/2024 1039) Pt Will Perform Lower Body Dressing:  with supervision  sitting/lateral leans  sit to/from stand Goal: Pt. Will Transfer To Toilet Flowsheets (Taken 06/06/2024 1039) Pt Will Transfer to Toilet:  with supervision  ambulating  regular height toilet Goal: Pt. Will Perform Toileting-Clothing Manipulation Flowsheets (Taken 06/06/2024 1039) Pt Will Perform Toileting - Clothing Manipulation and hygiene:  with supervision  sitting/lateral leans  sit to/from stand

## 2024-06-06 NOTE — Evaluation (Signed)
 Occupational Therapy Evaluation Patient Details Name: Amy Livingston MRN: [id number] DOB: 10/28/1955 Today's Date: 06/06/2024   History of Present Illness   Amy Livingston is a 68 y.o. female with medical history significant for autonomic dysfunction, diabetes mellitus, hypertension, atrial fibrillation.  Patient presented to the ED with complaints of several falls, generalized weakness.  EMS reports blood glucose of 534.  Patient reports she has been on treatment for her UTI with antibiotics, her last dose was yesterday, she does not remember the name of the medication.  She was in the ED 10/23, was given a dose of ceftriaxone , to complete a course of Keflex .     Clinical Impressions Pt agreeable to OT/PT co-evaluation this am, finishing breakfast on arrival. Pt reports continued frequent falls, probably at least 3 falls since last discharge. Pt requiring set-up for seated tasks, however is unable to participate in standing tasks safely due to dizziness upon standing. Pt with balance deficits due to dizziness, further limiting participation in standing tasks. Pt has limited help at home and currently is not safe to perform ADLs independently. Recommend continued skilled OT services to improve safety and independence during ADLs.      If plan is discharge home, recommend the following:   A lot of help with walking and/or transfers;A little help with bathing/dressing/bathroom;Assistance with cooking/housework;Assist for transportation     Functional Status Assessment   Patient has had a recent decline in their functional status and demonstrates the ability to make significant improvements in function in a reasonable and predictable amount of time.     Equipment Recommendations   None recommended by OT      Precautions/Restrictions   Precautions Precautions: Fall Restrictions Weight Bearing Restrictions Per Provider Order: No     Mobility Bed Mobility                General bed mobility comments: Defer to PT note    Transfers Overall transfer level: Needs assistance Equipment used: Rolling walker (2 wheels) Transfers: Sit to/from Stand Sit to Stand: Min assist           General transfer comment: dizziness upon standing          ADL either performed or assessed with clinical judgement   ADL Overall ADL's : Needs assistance/impaired Eating/Feeding: Modified independent;Sitting;Bed level Eating/Feeding Details (indicate cue type and reason): Pt finishing breakfast in bed on OT arrival, no difficulties Grooming: Set up;Sitting   Upper Body Bathing: Set up;Sitting   Lower Body Bathing: Set up;Sitting/lateral leans   Upper Body Dressing : Set up;Sitting Upper Body Dressing Details (indicate cue type and reason): changing down without difficulty Lower Body Dressing: Supervision/safety;Sitting/lateral leans;Sit to/from stand Lower Body Dressing Details (indicate cue type and reason): Pt donning socks independently Toilet Transfer: Minimal assistance;Stand-pivot;Rolling walker (2 wheels) Toilet Transfer Details (indicate cue type and reason): simulated with bed to chair transfer           General ADL Comments: Pt with dizziness upon standing, is able to complete tasks in sitting without difficulty. Balance and generalized weakness impacting tolerance for standing tasks     Vision Baseline Vision/History: 1 Wears glasses;4 Cataracts Ability to See in Adequate Light: 2 Moderately impaired Patient Visual Report: No change from baseline              Pertinent Vitals/Pain Pain Assessment Pain Assessment: 0-10 Pain Score: 6  Pain Location: back and bladder Pain Descriptors / Indicators: Aching, Burning, Sore Pain Intervention(s): Limited activity within patient's tolerance, Monitored  during session, Repositioned     Extremity/Trunk Assessment Upper Extremity Assessment Upper Extremity Assessment: Overall WFL for tasks assessed    Lower Extremity Assessment Lower Extremity Assessment: Defer to PT evaluation   Cervical / Trunk Assessment Cervical / Trunk Assessment: Kyphotic   Communication Communication Communication: No apparent difficulties   Cognition Arousal: Alert Behavior During Therapy: WFL for tasks assessed/performed Cognition: No apparent impairments                               Following commands: Intact       Cueing  General Comments   Cueing Techniques: Verbal cues              Home Living Family/patient expects to be discharged to:: Private residence Living Arrangements: Children (daughter) Available Help at Discharge: Family;Available 24 hours/day Type of Home: Apartment Home Access: Stairs to enter Entrance Stairs-Number of Steps: 3-4 Entrance Stairs-Rails: Right Home Layout: Two level;Able to live on main level with bedroom/bathroom Alternate Level Stairs-Number of Steps: 13 Alternate Level Stairs-Rails: Left Bathroom Shower/Tub: Tub/shower unit   Bathroom Toilet: Handicapped height   How Accessible: Accessible via walker Home Equipment: Agricultural Consultant (2 wheels);Cane - single point;Toilet riser;Shower seat;Hospital bed;Wheelchair - Physiological Scientist (4 wheels);BSC/3in1   Additional Comments: Lives with daughter. Pt reports no change in home set up since last admission.      Prior Functioning/Environment Prior Level of Function : Needs assist;History of Falls (last six months) (5+ falls since last admission)         Mobility (physical): Bed mobility;Transfers;Gait;Stairs   Mobility Comments: [employer] household ambulator with RW. ADLs Comments: Assist IADL's; independent ADL's.    OT Problem List: Decreased strength;Decreased activity tolerance;Impaired balance (sitting and/or standing);Decreased knowledge of use of DME or AE   OT Treatment/Interventions: Self-care/ADL training;Therapeutic exercise;Therapeutic activities;Patient/family education;Balance  training;DME and/or AE instruction      OT Goals(Current goals can be found in the care plan section)   Acute Rehab OT Goals Patient Stated Goal: To get stronger OT Goal Formulation: With patient Time For Goal Achievement: 06/20/24 Potential to Achieve Goals: Good   OT Frequency:  Min 1X/week    Co-evaluation PT/OT/SLP Co-Evaluation/Treatment: Yes Reason for Co-Treatment: To address functional/ADL transfers;For patient/therapist safety   OT goals addressed during session: ADL's and self-care      AM-PAC OT 6 Clicks Daily Activity     Outcome Measure Help from another person eating meals?: None Help from another person taking care of personal grooming?: A Little Help from another person toileting, which includes using toliet, bedpan, or urinal?: A Little Help from another person bathing (including washing, rinsing, drying)?: A Little Help from another person to put on and taking off regular upper body clothing?: A Little Help from another person to put on and taking off regular lower body clothing?: A Little 6 Click Score: 19   End of Session Equipment Utilized During Treatment: Gait belt;Rolling walker (2 wheels)  Activity Tolerance: Patient tolerated treatment well Patient left: in chair;with call bell/phone within reach;with chair alarm set  OT Visit Diagnosis: Unsteadiness on feet (R26.81);Other abnormalities of gait and mobility (R26.89);Repeated falls (R29.6);Muscle weakness (generalized) (M62.81)                Time: 9076-9055 OT Time Calculation (min): 21 min Charges:  OT General Charges $OT Visit: 1 Visit OT Evaluation $OT Eval Low Complexity: 1 Low  Sonny Cory, OTR/L  [phone] 06/06/2024, 10:36 AM

## 2024-06-07 ENCOUNTER — Inpatient Hospital Stay (HOSPITAL_COMMUNITY)

## 2024-06-07 DIAGNOSIS — N390 Urinary tract infection, site not specified: Secondary | ICD-10-CM | POA: Diagnosis not present

## 2024-06-07 LAB — GLUCOSE, CAPILLARY
Glucose-Capillary: 248 mg/dL — ABNORMAL HIGH (ref 70–99)
Glucose-Capillary: 202 mg/dL — ABNORMAL HIGH (ref 70–99)
Glucose-Capillary: 53 mg/dL — ABNORMAL LOW (ref 70–99)
Glucose-Capillary: 92 mg/dL (ref 70–99)
Glucose-Capillary: 128 mg/dL — ABNORMAL HIGH (ref 70–99)
Glucose-Capillary: 119 mg/dL — ABNORMAL HIGH (ref 70–99)

## 2024-06-07 LAB — CBC WITH DIFFERENTIAL/PLATELET
Abs Immature Granulocytes: 0.06 K/uL (ref 0.00–0.07)
Basophils Absolute: 0 K/uL (ref 0.0–0.1)
Basophils Relative: 0 %
Eosinophils Absolute: 0.3 K/uL (ref 0.0–0.5)
Eosinophils Relative: 4 %
HCT: 23.2 % — ABNORMAL LOW (ref 36.0–46.0)
Hemoglobin: 7 g/dL — ABNORMAL LOW (ref 12.0–15.0)
Immature Granulocytes: 1 %
Lymphocytes Relative: 25 %
Lymphs Abs: 1.9 K/uL (ref 0.7–4.0)
MCH: 24.8 pg — ABNORMAL LOW (ref 26.0–34.0)
MCHC: 30.2 g/dL (ref 30.0–36.0)
MCV: 82.3 fL (ref 80.0–100.0)
Monocytes Absolute: 0.7 K/uL (ref 0.1–1.0)
Monocytes Relative: 9 %
Neutro Abs: 4.6 K/uL (ref 1.7–7.7)
Neutrophils Relative %: 61 %
Platelets: 314 K/uL (ref 150–400)
RBC: 2.82 MIL/uL — ABNORMAL LOW (ref 3.87–5.11)
RDW: 19.6 % — ABNORMAL HIGH (ref 11.5–15.5)
WBC: 7.6 K/uL (ref 4.0–10.5)
nRBC: 0 % (ref 0.0–0.2)

## 2024-06-07 LAB — BASIC METABOLIC PANEL WITH GFR
Anion gap: 9 (ref 5–15)
BUN: 12 mg/dL (ref 8–23)
CO2: 25 mmol/L (ref 22–32)
Calcium: 8.7 mg/dL — ABNORMAL LOW (ref 8.9–10.3)
Chloride: 100 mmol/L (ref 98–111)
Creatinine, Ser: 1.05 mg/dL — ABNORMAL HIGH (ref 0.44–1.00)
GFR, Estimated: 58 mL/min — ABNORMAL LOW (ref >60)
Glucose, Bld: 295 mg/dL — ABNORMAL HIGH (ref 70–99)
Potassium: 4.2 mmol/L (ref 3.5–5.1)
Sodium: 134 mmol/L — ABNORMAL LOW (ref 135–145)

## 2024-06-07 LAB — URINE CULTURE: Culture: >=100000 — AB

## 2024-06-07 LAB — PREPARE RBC (CROSSMATCH)

## 2024-06-07 LAB — ABO/RH: ABO/RH(D): O NEG

## 2024-06-07 MED ORDER — SODIUM CHLORIDE 0.9% IV SOLUTION: Freq: Once | INTRAVENOUS | Status: AC

## 2024-06-07 MED ORDER — MAGIC MOUTHWASH
5.0000 mL | Freq: Three times a day (TID) | ORAL | Status: DC | PRN
  Administered 2024-06-08: 5 mL via ORAL
  Filled 2024-06-07 (×2): qty 5

## 2024-06-07 MED ORDER — PHENAZOPYRIDINE HCL 100 MG PO TABS
100.0000 mg | ORAL_TABLET | Freq: Three times a day (TID) | ORAL | Status: DC
  Administered 2024-06-07 – 2024-06-08 (×4): 100 mg via ORAL
  Filled 2024-06-07 (×4): qty 1

## 2024-06-07 MED ORDER — SODIUM CHLORIDE 0.9 % IV SOLN
1.0000 g | INTRAVENOUS | Status: DC
  Administered 2024-06-07 – 2024-06-08 (×2): 1 g via INTRAVENOUS
  Filled 2024-06-07: qty 1
  Filled 2024-06-07 (×2): qty 1000

## 2024-06-07 MED ORDER — CYCLOBENZAPRINE HCL 10 MG PO TABS
10.0000 mg | ORAL_TABLET | Freq: Three times a day (TID) | ORAL | Status: DC | PRN
Start: 2024-06-07 — End: 2024-06-08

## 2024-06-07 MED ORDER — GABAPENTIN 300 MG PO CAPS
300.0000 mg | ORAL_CAPSULE | Freq: Three times a day (TID) | ORAL | Status: DC
Start: 2024-06-07 — End: 2024-06-08
  Administered 2024-06-07 – 2024-06-08 (×5): 300 mg via ORAL
  Filled 2024-06-07 (×5): qty 1

## 2024-06-07 MED ORDER — SODIUM CHLORIDE 0.9 % IV SOLN: INTRAVENOUS | Status: AC

## 2024-06-07 MED ORDER — INSULIN ASPART PROT & ASPART (70-30 MIX) 100 UNIT/ML ~~LOC~~ SUSP
10.0000 [IU] | Freq: Every day | SUBCUTANEOUS | Status: DC
  Administered 2024-06-07: 10 [IU] via SUBCUTANEOUS
  Filled 2024-06-07: qty 10

## 2024-06-07 MED ORDER — INSULIN ASPART PROT & ASPART (70-30 MIX) 100 UNIT/ML ~~LOC~~ SUSP
25.0000 [IU] | Freq: Every day | SUBCUTANEOUS | Status: DC
  Administered 2024-06-08 (×2): 25 [IU] via SUBCUTANEOUS
  Filled 2024-06-07 (×2): qty 10

## 2024-06-07 MED ORDER — SODIUM CHLORIDE 0.9% FLUSH: 10.0000 mL | INTRAVENOUS | Status: DC | PRN

## 2024-06-07 MED ORDER — SODIUM CHLORIDE 0.9% FLUSH: 10.0000 mL | Freq: Two times a day (BID) | INTRAVENOUS | Status: DC

## 2024-06-07 NOTE — Progress Notes (Signed)

## 2024-06-07 NOTE — Inpatient Diabetes Management (Signed)
 Inpatient Diabetes Program Recommendations  AACE/ADA: New Consensus Statement on Inpatient Glycemic Control   Target Ranges:  Prepandial:   less than 140 mg/dL      Peak postprandial:   less than 180 mg/dL (1-2 hours)      Critically ill patients:  140 - 180 mg/dL    Latest Reference Range & Units 06/06/24 07:31 06/06/24 09:01 06/06/24 11:11 06/06/24 14:08 06/06/24 16:16 06/06/24 21:01 06/07/24 07:56  Glucose-Capillary 70 - 99 mg/dL 677 (H) 659 (H)   Novolog  11 units @9 :06  70/30 30 units @9 :02  290 (H)   Novolog  8 units @11 :47 79 80 178 (H) 248 (H)   Novolog  5 units  70/30 30 units    Review of Glycemic Control  Diabetes history: DM2 Outpatient Diabetes medications: 70/30 30 units BID, Mounjaro  15 mg Qweek Current orders for Inpatient glycemic control: 70/30 30 units BID, Novolog  0-15 units TID with meals, Novolog  0-5 units QHS  Inpatient Diabetes Program Recommendations:    Insulin : Patient received 70/30 30 units at 9:02 am on 10/29 and NO evening dose of 70/30 on 10/29. CBG down to 79 mg/dl at 85:91 on 89/70 and 751 mg/dl this morning. Patient has already received 70/30 30 units today at 8:50 am. Please consider decreasing 70/30 to 25 units QAM with breakfast and 70/30 10 units QPM with supper.    Thanks, Earnie Gainer, RN, MSN, CDCES Diabetes Coordinator Inpatient Diabetes Program [phone] (Team Pager from 8am to 5pm)

## 2024-06-07 NOTE — Progress Notes (Signed)
 Midline placed in left upper arm without difficulty. Ready to use.

## 2024-06-07 NOTE — Progress Notes (Signed)
 PROGRESS NOTE    Amy Livingston  FMW:[id number] DOB: 03-27-1956 DOA: 06/05/2024 PCP: Edman Meade PEDLAR, FNP   Brief Narrative:    Amy Livingston is a 68 y.o. female with medical history significant for autonomic dysfunction, diabetes mellitus, hypertension, atrial fibrillation. Patient presented to the ED with complaints of several falls, generalized weakness.  EMS reports blood glucose of 534.  Patient reports she has been on treatment for her UTI with antibiotics, her last dose was yesterday, she does not remember the name of the medication.  She was in the ED 10/23, was given a dose of ceftriaxone , to complete a course of Keflex .   Recent hospitalization 10/6 to 10/9 for urinary tract infection-cultures grew Klebsiella.  With electrolyte abnormalities.  Initially started on ertapenem , due to history of ESBL infection in the past, but was transition over to Keflex  with culture showing multidrug sensitive Klebsiella.  She also had persistent diarrhea, with history of C. difficile infection, she had rectal tube placed.  Was treated with oral vancomycin .   ED Course: Temperature 98.2.  Heart rate 95-103.  Respirate rate 17-18.  Blood pressure systolic 107-188.  O2 sats greater 97% on room air. CBG 456.  Lactic acid 1.1. UA with positive nitrites and large leukocytes many bacteria. Head CT, chest x-ray negative for acute abnormality. CT renal stone study-bilateral hydroureteronephrosis, progressed on the left and new on the right, diffuse bilateral perinephric edema, moderate bladder distention and bladder wall thickening.  Findings suspicious for bladder outlet obstruction. IV Zosyn  started. 1 L bolus given.   Assessment & Plan:   Principal Problem:   Complicated UTI (urinary tract infection) Active Problems:   Hyponatremia   Hypomagnesemia   Bladder outlet obstruction   Type 2 diabetes mellitus with hyperglycemia (HCC)   PAF (paroxysmal atrial fibrillation) (HCC)   Essential  hypertension  Patient admitted for complicated UTI in the setting of bladder outlet obstruction  Complicated urinary tract infection-with bilateral hydro ureteric nephrosis, bladder outlet obstruction, pyelonephritis, last urine cultures 05/31/2024 grew ESBL Klebsiella.  Afebrile.  WBC 8.9.  Lactic acid 1.1.  Rules out for sepsis.  UA suggestive of UTI with positive nitrites large leukocytes and many bacteria.  - Urine culture is positive for ESBL Klebsiella.  Will switch antibiotics to ertapenem .  Follow-up blood culture.  Will place midline.  Consider 7 days of IV ertapenem  postdischarge. -Continue C diff prophylaxis with vancomycin  given previous history. - Continue Foley catheter.  Patient may need to be discharged on Foley catheter.  Will obtain follow-up ultrasound to assess hydronephrosis. - Outpatient follow-up with urology   Bladder outlet obstruction-patient also reports fecal incontinence x 1x month, multiple falls from bilateral lower extremities buckling when bearing weight,-ongoing for about 10 months.  Reports chronic unchanged back pain, status post multiple surgeries -per patient 7 back surgeries.  - MRI thoracolumbar spine -postsurgical changes.  No significant adverse findings. - Negative urine output of more than 3 L.  Will have IV fluid for maintenance given significant negative output   Multiple falls, chronic back pain - Follow-up MRI -Resume gabapentin .   Paroxysmal atrial fibrillation-rate controlled and on anticoagulation with Eliquis  - Resume Eliquis    Hypomagnesemia, hyponatremia sodium 130, magnesium  1.5 on presentation.  Sodium 134 today, will recheck magnesium   Chronic anemia: Hemoglobin 7.0 today again.  Hemoglobin has been between 7-8 recently.  Will monitor daily.   Discussed blood transfusion.  Patient agreeable.  This will hopefully help with some of her symptoms including dizziness  Hypertension-stable. Not on medications.  Diabetes mellitus with  hyperglycemia-uncontrolled.  A1c 8.9. - SSI- M - Adjust NPH NPH to 25 unit every morning and 10 unit every afternoon. - Hold Mounjaro    Anxiety disorder - Resume buspirone , Zoloft    DVT prophylaxis: Lovenox Code Status: FULL Family Communication: none at bedside Disposition Plan: ~ 2 days Consults called: None Admission status: Inpt med surg I certify that at the point of admission it is my clinical judgment that the patient will require inpatient hospital care spanning beyond 2 midnights from the point of admission due to high intensity of service, high risk for further deterioration and high frequency of surveillance required.       Subjective:  Patient seen and examined at the bedside.  She denies abdominal pain today.  Reports of right leg pain.  Vital signs are stable.  She is afebrile.  Denies nausea or vomiting.  Overall feels better.  Tmax 99.5 F noted.  Objective: Vitals:   06/06/24 1955 06/07/24 0259 06/07/24 1245 06/07/24 1325  BP: (!) 121/59 (!) 149/62 127/63 (!) 115/49  Pulse: (!) 103 99 (!) 107 (!) 117  Resp: 18 18 20 20   Temp: 98.6 F (37 C) 98.6 F (37 C) 98.4 F (36.9 C) 98.4 F (36.9 C)  TempSrc: Oral Oral Oral Oral  SpO2: 100% 100% 96% 95%  Weight:      Height:        Intake/Output Summary (Last 24 hours) at 06/07/2024 1501 Last data filed at 06/07/2024 1100 Gross per 24 hour  Intake 960.6 ml  Output 4400 ml  Net -3439.4 ml   Filed Weights   06/05/24 2102  Weight: 80.9 kg    Examination:  General: Alert, oriented not in any acute distress Chest: Clear bilaterally CVs: S1, S2, no murmur, regular rhythm Abdomen: Soft, nontender Extremities: Trace edema bilaterally uro: Foley/tubing with cloudy urine   Data Reviewed: I have personally reviewed following labs and imaging studies  CBC: Recent Labs  Lab 06/05/24 1455 06/06/24 0456 06/07/24 1003  WBC 8.9 9.2 7.6  NEUTROABS 6.2  --  4.6  HGB 7.2* 7.0* 7.0*  HCT 23.9* 23.8* 23.2*   MCV 82.1 81.8 82.3  PLT 340 307 314   Basic Metabolic Panel: Recent Labs  Lab 06/05/24 1455 06/06/24 0456 06/07/24 1003  NA 130* 133* 134*  K 4.3 4.3 4.2  CL 95* 98 100  CO2 26 28 25   GLUCOSE 456* 283* 295*  BUN 13 11 12   CREATININE 1.21* 1.10* 1.05*  CALCIUM  9.4 9.0 8.7*  MG 1.5*  --   --    GFR: Estimated Creatinine Clearance: 55.7 mL/min (A) (by C-G formula based on SCr of 1.05 mg/dL (H)). Liver Function Tests: Recent Labs  Lab 06/05/24 1455  AST <10*  ALT <5  ALKPHOS 151*  BILITOT 0.3  PROT 6.5  ALBUMIN 2.9*   No results for input(s): LIPASE, AMYLASE in the last 168 hours. No results for input(s): AMMONIA in the last 168 hours. Coagulation Profile: No results for input(s): INR, PROTIME in the last 168 hours. Cardiac Enzymes: No results for input(s): CKTOTAL, CKMB, CKMBINDEX, TROPONINI in the last 168 hours. BNP (last 3 results) No results for input(s): PROBNP in the last 8760 hours. HbA1C: No results for input(s): HGBA1C in the last 72 hours. CBG: Recent Labs  Lab 06/06/24 2101 06/07/24 0756 06/07/24 1112 06/07/24 1306 06/07/24 1321  GLUCAP 178* 248* 202* 53* 92   Lipid Profile: No results for input(s): CHOL, HDL, LDLCALC, TRIG, CHOLHDL, LDLDIRECT in the  last 72 hours. Thyroid  Function Tests: No results for input(s): TSH, T4TOTAL, FREET4, T3FREE, THYROIDAB in the last 72 hours. Anemia Panel: No results for input(s): VITAMINB12, FOLATE, FERRITIN, TIBC, IRON, RETICCTPCT in the last 72 hours. Sepsis Labs: Recent Labs  Lab 06/05/24 1455  LATICACIDVEN 1.1    Recent Results (from the past 240 hours)  Urine Culture     Status: Abnormal   Collection Time: 05/31/24  4:32 PM   Specimen: Urine, Random  Result Value Ref Range Status   Specimen Description   Final    URINE, RANDOM Performed at [facility], [address], [place], [place] [address]    Special Requests   Final    NONE Reflexed  from [id number] Performed at [facility], [address] [place], [place] [address]    Culture (A)  Final    >=100,000 COLONIES/mL KLEBSIELLA PNEUMONIAE Confirmed Extended Spectrum Beta-Lactamase Producer (ESBL).  In bloodstream infections from ESBL organisms, carbapenems are preferred over piperacillin /tazobactam. They are shown to have a lower risk of mortality.    Report Status 06/02/2024 FINAL  Final   Organism ID, Bacteria KLEBSIELLA PNEUMONIAE (A)  Final      Susceptibility   Klebsiella pneumoniae - MIC*    AMPICILLIN >=32 RESISTANT Resistant     CEFAZOLIN  (URINE) Value in next row Resistant      >=32 RESISTANTThis is a modified FDA-approved test that has been validated and its performance characteristics determined by the reporting laboratory.  This laboratory is certified under the Clinical Laboratory Improvement Amendments CLIA as qualified to perform high complexity clinical laboratory testing.    CEFEPIME  Value in next row Resistant      >=32 RESISTANTThis is a modified FDA-approved test that has been validated and its performance characteristics determined by the reporting laboratory.  This laboratory is certified under the Clinical Laboratory Improvement Amendments CLIA as qualified to perform high complexity clinical laboratory testing.    ERTAPENEM  Value in next row Sensitive      >=32 RESISTANTThis is a modified FDA-approved test that has been validated and its performance characteristics determined by the reporting laboratory.  This laboratory is certified under the Clinical Laboratory Improvement Amendments CLIA as qualified to perform high complexity clinical laboratory testing.    CEFTRIAXONE  Value in next row Resistant      >=32 RESISTANTThis is a modified FDA-approved test that has been validated and its performance characteristics determined by the reporting laboratory.  This laboratory is certified under the Clinical Laboratory Improvement Amendments CLIA as qualified to  perform high complexity clinical laboratory testing.    CIPROFLOXACIN Value in next row Resistant      >=32 RESISTANTThis is a modified FDA-approved test that has been validated and its performance characteristics determined by the reporting laboratory.  This laboratory is certified under the Clinical Laboratory Improvement Amendments CLIA as qualified to perform high complexity clinical laboratory testing.    GENTAMICIN Value in next row Resistant      >=32 RESISTANTThis is a modified FDA-approved test that has been validated and its performance characteristics determined by the reporting laboratory.  This laboratory is certified under the Clinical Laboratory Improvement Amendments CLIA as qualified to perform high complexity clinical laboratory testing.    NITROFURANTOIN  Value in next row Resistant      >=32 RESISTANTThis is a modified FDA-approved test that has been validated and its performance characteristics determined by the reporting laboratory.  This laboratory is certified under the Clinical Laboratory Improvement Amendments CLIA as qualified to perform high  complexity clinical laboratory testing.    TRIMETH/SULFA Value in next row Resistant      >=32 RESISTANTThis is a modified FDA-approved test that has been validated and its performance characteristics determined by the reporting laboratory.  This laboratory is certified under the Clinical Laboratory Improvement Amendments CLIA as qualified to perform high complexity clinical laboratory testing.    AMPICILLIN/SULBACTAM Value in next row Resistant      >=32 RESISTANTThis is a modified FDA-approved test that has been validated and its performance characteristics determined by the reporting laboratory.  This laboratory is certified under the Clinical Laboratory Improvement Amendments CLIA as qualified to perform high complexity clinical laboratory testing.    PIP/TAZO Value in next row Sensitive      16 SENSITIVEThis is a modified FDA-approved  test that has been validated and its performance characteristics determined by the reporting laboratory.  This laboratory is certified under the Clinical Laboratory Improvement Amendments CLIA as qualified to perform high complexity clinical laboratory testing.    MEROPENEM  Value in next row Sensitive      16 SENSITIVEThis is a modified FDA-approved test that has been validated and its performance characteristics determined by the reporting laboratory.  This laboratory is certified under the Clinical Laboratory Improvement Amendments CLIA as qualified to perform high complexity clinical laboratory testing.    * >=100,000 COLONIES/mL KLEBSIELLA PNEUMONIAE  Culture, blood (routine x 2)     Status: None (Preliminary result)   Collection Time: 06/05/24  2:55 PM   Specimen: BLOOD  Result Value Ref Range Status   Specimen Description BLOOD RIGHT ANTECUBITAL  Final   Special Requests   Final    BOTTLES DRAWN AEROBIC AND ANAEROBIC Blood Culture adequate volume   Culture   Final    NO GROWTH 2 DAYS Performed at [facility], [address], [place], [place] [address]    Report Status PENDING  Incomplete  Culture, blood (routine x 2)     Status: None (Preliminary result)   Collection Time: 06/05/24  2:55 PM   Specimen: BLOOD  Result Value Ref Range Status   Specimen Description BLOOD RIGHT ANTECUBITAL  Final   Special Requests   Final    BOTTLES DRAWN AEROBIC ONLY Blood Culture results may not be optimal due to an inadequate volume of blood received in culture bottles   Culture   Final    NO GROWTH 2 DAYS Performed at [facility], [address] [place], [place] [address]    Report Status PENDING  Incomplete  Urine Culture     Status: Abnormal   Collection Time: 06/05/24  5:08 PM   Specimen: Urine, Catheterized  Result Value Ref Range Status   Specimen Description   Final    URINE, CATHETERIZED Performed at [facility], [address], [place], [place] [address]    Special Requests   Final     NONE Performed at [facility], [address], [place], [place] [address]    Culture (A)  Final    >=100,000 COLONIES/mL KLEBSIELLA PNEUMONIAE Confirmed Extended Spectrum Beta-Lactamase Producer (ESBL).  In bloodstream infections from ESBL organisms, carbapenems are preferred over piperacillin /tazobactam. They are shown to have a lower risk of mortality.    Report Status 06/07/2024 FINAL  Final   Organism ID, Bacteria KLEBSIELLA PNEUMONIAE (A)  Final      Susceptibility   Klebsiella pneumoniae - MIC*    AMPICILLIN >=32 RESISTANT Resistant     CEFAZOLIN  (URINE) Value in next row Resistant      >=  32 RESISTANTThis is a modified FDA-approved test that has been validated and its performance characteristics determined by the reporting laboratory.  This laboratory is certified under the Clinical Laboratory Improvement Amendments CLIA as qualified to perform high complexity clinical laboratory testing.    CEFEPIME  Value in next row Resistant      >=32 RESISTANTThis is a modified FDA-approved test that has been validated and its performance characteristics determined by the reporting laboratory.  This laboratory is certified under the Clinical Laboratory Improvement Amendments CLIA as qualified to perform high complexity clinical laboratory testing.    ERTAPENEM  Value in next row Sensitive      >=32 RESISTANTThis is a modified FDA-approved test that has been validated and its performance characteristics determined by the reporting laboratory.  This laboratory is certified under the Clinical Laboratory Improvement Amendments CLIA as qualified to perform high complexity clinical laboratory testing.    CEFTRIAXONE  Value in next row Resistant      >=32 RESISTANTThis is a modified FDA-approved test that has been validated and its performance characteristics determined by the reporting laboratory.  This laboratory is certified under the Clinical Laboratory Improvement Amendments CLIA as qualified to perform  high complexity clinical laboratory testing.    CIPROFLOXACIN Value in next row Resistant      >=32 RESISTANTThis is a modified FDA-approved test that has been validated and its performance characteristics determined by the reporting laboratory.  This laboratory is certified under the Clinical Laboratory Improvement Amendments CLIA as qualified to perform high complexity clinical laboratory testing.    GENTAMICIN Value in next row Resistant      >=32 RESISTANTThis is a modified FDA-approved test that has been validated and its performance characteristics determined by the reporting laboratory.  This laboratory is certified under the Clinical Laboratory Improvement Amendments CLIA as qualified to perform high complexity clinical laboratory testing.    NITROFURANTOIN  Value in next row Resistant      >=32 RESISTANTThis is a modified FDA-approved test that has been validated and its performance characteristics determined by the reporting laboratory.  This laboratory is certified under the Clinical Laboratory Improvement Amendments CLIA as qualified to perform high complexity clinical laboratory testing.    TRIMETH/SULFA Value in next row Resistant      >=32 RESISTANTThis is a modified FDA-approved test that has been validated and its performance characteristics determined by the reporting laboratory.  This laboratory is certified under the Clinical Laboratory Improvement Amendments CLIA as qualified to perform high complexity clinical laboratory testing.    AMPICILLIN/SULBACTAM Value in next row Resistant      >=32 RESISTANTThis is a modified FDA-approved test that has been validated and its performance characteristics determined by the reporting laboratory.  This laboratory is certified under the Clinical Laboratory Improvement Amendments CLIA as qualified to perform high complexity clinical laboratory testing.    PIP/TAZO Value in next row Sensitive      16 SENSITIVEThis is a modified FDA-approved test  that has been validated and its performance characteristics determined by the reporting laboratory.  This laboratory is certified under the Clinical Laboratory Improvement Amendments CLIA as qualified to perform high complexity clinical laboratory testing.    MEROPENEM  Value in next row Sensitive      16 SENSITIVEThis is a modified FDA-approved test that has been validated and its performance characteristics determined by the reporting laboratory.  This laboratory is certified under the Clinical Laboratory Improvement Amendments CLIA as qualified to perform high complexity clinical laboratory testing.    * >=100,000 COLONIES/mL KLEBSIELLA  PNEUMONIAE         Radiology Studies: MR SACRUM SI JOINTS WO CONTRAST Result Date: 06/06/2024 EXAM: MR Sacrum without intravenous contrast. 06/06/2024 09:23:47 AM TECHNIQUE: Multiplanar multi-sequence MR imaging of the sacrum was performed. No intravenous contrast was administered. COMPARISON: CT pelvis 06/05/2024. CLINICAL HISTORY: Urinary retention, fecal incontinence, bilateral lower extremity weakness, chronic back pain, history of lumbar surgery. FINDINGS: SOFT TISSUES: Mild lower presacral edema. BONES: Acquired interbody fusion at L4-L5 with prominent loss of intervertebral disc height at L5-S1. No acute fracture or focal osseous lesion. JOINTS: The sacroiliac joints appear normal and symmetric. LIMITED INTRAPELVIC CONTENTS: A catheter is present in the urinary bladder along with fluid and gas in the urinary bladder. Bladder wall thickening may be from nondistention although cystitis is not excluded. Sigmoid colon diverticulosis. Bilateral distal hydroureter. Trace symmetric edema along the deep margins of the iliac small vessels, nonspecific. No impinging lesion along the sacral plexus or proximal sciatic nerves. IMPRESSION: 1. Mild lower presacral edema. 2. Acquired interbody fusion at L4-5 with prominent loss of intervertebral disc height at L5-S1. 3.  Bilateral distal hydroureter. 4. Bladder wall thickening potentially from cystitis or non-distention. A foley catheter is in place. 5. Sigmoid colon diverticulosis. Electronically signed by: Ryan Salvage MD 06/06/2024 04:58 PM EDT RP Workstation: [id number]   MR LUMBAR SPINE WO CONTRAST Result Date: 06/06/2024 EXAM: MRI LUMBAR SPINE 06/06/2024 09:23:47 AM TECHNIQUE: Multiplanar multisequence MRI of the lumbar spine was performed without the administration of intravenous contrast. COMPARISON: Lumbar radiographs 11/07/2019. CT abdomen and pelvis 06/05/2024. Lumbar MRI 04/01/2016. CLINICAL HISTORY: 68 year old female. Urinary retention, fecal incontinence, bilateral lower extremity weakness. Chronic back pain and back surgeries. FINDINGS: BONES AND ALIGNMENT: Normal lumbar segmentation. Chronic lumbar fusion beginning at L3, with L3-L4 posterior and interbody hardware still in place, evidence of solid arthrodesis on CT 06/05/2024 through to the sacrum. Stable vertebral height and alignment since the previous MRI. Normal background bone marrow signal. Mild hardware susceptibility artifact. No marrow edema. Intact visible sacrum. SPINAL CORD: The conus terminates normally at T12. SOFT TISSUES: No paraspinal mass. Visible abdominal viscera stable from details of CT abdomen and pelvis 06/05/2024. Mild lower thoracic disc bulging through T12-L1. No lower thoracic spinal stenosis. L1-L2: Mild disc bulging. Mild to moderate facet and ligamentum flavum hypertrophy. No stenosis. L2-L3: Adjacent segment disc space loss, circumferential disc bulge, broad based posterior component. Moderate facet and ligamentum flavum hypertrophy. Epidural lipomatosis has regressed since the previous MRI and spinal canal patency is improved. Borderline to mild spinal stenosis. Mild left and moderate right L2 neural foraminal stenosis. The foraminal narrowing has not significantly changed. L3-L4 THROUGH L5-S1: Chronic decompression and  fusion L3-L4 through L5-S1 with solid arthrodesis. IMPRESSION: 1. Previous lumbar fusion L3 through sacrum without adverse features. 2. Adjacent segment degeneration at L2-L3 with borderline spinal stenosis improved from 2017 MRI. Stable mild left and moderate right L2 foraminal stenosis. Electronically signed by: Helayne Hurst MD 06/06/2024 10:03 AM EDT RP Workstation: [id number]   CT Renal Stone Study Result Date: 06/05/2024 CLINICAL DATA:  Provided history: Abdominal/flank pain, stone suspected Patient reports weakness and multiple falls. EXAM: CT ABDOMEN AND PELVIS WITHOUT CONTRAST TECHNIQUE: Multidetector CT imaging of the abdomen and pelvis was performed following the standard protocol without IV contrast. RADIATION DOSE REDUCTION: This exam was performed according to the departmental dose-optimization program which includes automated exposure control, adjustment of the mA and/or kV according to patient size and/or use of iterative reconstruction technique. COMPARISON:  CT 10/08/2023 FINDINGS: Lower chest:  The lung bases are clear. Hepatobiliary: No focal liver abnormality is seen. Punctate granuloma in the inferior right lobe of the liver status post cholecystectomy. No biliary dilatation. Pancreas: No ductal dilatation or inflammation. Spleen: Normal in size without focal abnormality. Adrenals/Urinary Tract: Normal adrenal glands. Bilateral hydroureteronephrosis. On the left this has progressed, and new hydronephrosis on the right. The left renal collecting system is slightly more dilated than the right. Ureters are dilated and tortuous to the bladder insertion. There is diffuse bilateral perinephric edema. Possible punctate nonobstructing stone in the lower left kidney. Moderate bladder distension with mild trabeculation and wall thickening. 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 Stomach/Bowel: The stomach is nondistended. No bowel obstruction or inflammation. Normal appendix. Moderate colonic stool burden.  Distal colonic diverticulosis without focal diverticulitis. Vascular/Lymphatic: Aortic and branch atherosclerosis. There is retroperitoneal adenopathy at the level of the kidneys, greater on the left. Representative lymph node measures 12 mm short axis series 11, image 36. Reproductive: Status post hysterectomy. No adnexal masses. Other: Retroperitoneal and perinephric edema. No significant ascites. No free air. Musculoskeletal: Degenerative and postsurgical change in the spine. There are no acute or suspicious osseous abnormalities. IMPRESSION: 1. Bilateral hydroureteronephrosis, progressed on the left and new on the right. There is diffuse bilateral perinephric edema. Moderate bladder distension with mild trabeculation and wall thickening. 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 Findings are suspicious for bladder outlet obstruction. 2. Retroperitoneal and perinephric edema may be due to obstructive uropathy, however recommend correlation with urinalysis to exclude urinary tract infection. 3. Retroperitoneal adenopathy at the level of the kidneys, greater on the left, indeterminate in etiology. 4. Colonic diverticulosis without focal diverticulitis. Aortic Atherosclerosis (ICD10-I70.0). Electronically Signed   By: Andrea Gasman M.D.   On: 06/05/2024 16:52   CT Head Wo Contrast Result Date: 06/05/2024 CLINICAL DATA:  Syncope/presyncope, cerebrovascular cause suspected EXAM: CT HEAD WITHOUT CONTRAST TECHNIQUE: Contiguous axial images were obtained from the base of the skull through the vertex without intravenous contrast. RADIATION DOSE REDUCTION: This exam was performed according to the departmental dose-optimization program which includes automated exposure control, adjustment of the mA and/or kV according to patient size and/or use of iterative reconstruction technique. COMPARISON:  10/08/2023 FINDINGS: Brain: No intracranial hemorrhage, mass effect, or midline shift. No hydrocephalus. The basilar cisterns are  patent. 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 No evidence of territorial infarct or acute ischemia. No extra-axial or intracranial fluid collection. Vascular: Atherosclerosis of skullbase vasculature without hyperdense vessel or abnormal calcification. Skull: No fracture or suspicious lesion. Sinuses/Orbits: Paranasal sinuses and mastoid air cells are clear. The visualized orbits are unremarkable. Other: None. IMPRESSION: No acute intracranial abnormality. Electronically Signed   By: Andrea Gasman M.D.   On: 06/05/2024 16:35        Scheduled Meds:  apixaban   5 mg Oral BID   atorvastatin   80 mg Oral Daily   busPIRone   10 mg Oral BID   Chlorhexidine  Gluconate Cloth  6 each Topical Daily   gabapentin   300 mg Oral TID   insulin  aspart  0-15 Units Subcutaneous TID WC   insulin  aspart  0-5 Units Subcutaneous QHS   insulin  aspart protamine- aspart  10 Units Subcutaneous Q supper   [START ON 06/08/2024] insulin  aspart protamine- aspart  25 Units Subcutaneous Q breakfast   phenazopyridine  100 mg Oral TID WC   sertraline   50 mg Oral Daily   traZODone   100 mg Oral QHS   vancomycin   125 mg Oral Daily   Continuous Infusions:  ertapenem             Derryl Duval, MD [facility] 06/07/2024, 3:01 PM

## 2024-06-07 NOTE — TOC Progression Note (Addendum)
 Transition of Care [facility]) - Progression Note    Patient Details  Name: Amy Livingston MRN: [id number] Date of Birth: 12-06-1955  Transition of Care [facility] CM/SW Contact  Sharlyne Stabs, RN Phone Number: 06/07/2024, 1:06 PM  Clinical Narrative:   CM at the bedside to get more SNF bed choices. [facility] declined. Patient requested [facility]. CM spoke with Kristin, they will not have a bed until next Tuesday. Patient will be agreeable to [facility]. Search expanded. IPCM following.    Addendum: discussed bed offers with patient, she wants [facility].  Team updated.   Expected Discharge Plan: Skilled Nursing Facility Barriers to Discharge: No SNF bed  Expected Discharge Plan and Services In-house Referral: Clinical Social Work Discharge Planning Services: NA Post Acute Care Choice: Skilled Nursing Facility Living arrangements for the past 2 months: Apartment                 DME Arranged: N/A DME Agency: NA        Social Drivers of Health (SDOH) Interventions SDOH Screenings   Food Insecurity: Food Insecurity Present (06/05/2024)  Housing: Low Risk  (06/05/2024)  Transportation Needs: No Transportation Needs (06/05/2024)  Utilities: Not At Risk (06/05/2024)  Alcohol Screen: Low Risk  (03/21/2024)  Depression (PHQ2-9): Low Risk  (03/22/2024)  Financial Resource Strain: Medium Risk (03/21/2024)  Physical Activity: Inactive (03/21/2024)  Social Connections: Socially Isolated (06/05/2024)  Stress: Stress Concern Present (03/21/2024)  Tobacco Use: Medium Risk (06/05/2024)  Health Literacy: Adequate Health Literacy (09/28/2023)    Readmission Risk Interventions    06/06/2024   12:55 PM 05/16/2024    1:16 PM 05/15/2024    1:54 PM  Readmission Risk Prevention Plan  Transportation Screening Complete Complete Complete  PCP or Specialist Appt within 5-7 Days Complete    Home Care Screening Complete Complete Complete  Medication Review (RN CM) Complete Complete Complete

## 2024-06-08 ENCOUNTER — Other Ambulatory Visit: Payer: Self-pay | Admitting: Hospitalist

## 2024-06-08 DIAGNOSIS — B9689 Other specified bacterial agents as the cause of diseases classified elsewhere: Secondary | ICD-10-CM | POA: Diagnosis not present

## 2024-06-08 DIAGNOSIS — N390 Urinary tract infection, site not specified: Secondary | ICD-10-CM | POA: Diagnosis not present

## 2024-06-08 DIAGNOSIS — L8990 Pressure ulcer of unspecified site, unspecified stage: Secondary | ICD-10-CM | POA: Insufficient documentation

## 2024-06-08 LAB — GLUCOSE, CAPILLARY
Glucose-Capillary: 82 mg/dL (ref 70–99)
Glucose-Capillary: 197 mg/dL — ABNORMAL HIGH (ref 70–99)

## 2024-06-08 LAB — BPAM RBC
Blood Product Expiration Date: 202511242359
ISSUE DATE / TIME: 202510301302
Unit Type and Rh: 9500

## 2024-06-08 LAB — HEMOGLOBIN AND HEMATOCRIT, BLOOD
HCT: 29.2 % — ABNORMAL LOW (ref 36.0–46.0)
Hemoglobin: 8.8 g/dL — ABNORMAL LOW (ref 12.0–15.0)

## 2024-06-08 LAB — TYPE AND SCREEN
ABO/RH(D): O NEG
Antibody Screen: NEGATIVE
Unit division: 0

## 2024-06-08 MED ORDER — NOVOLIN 70/30 FLEXPEN (70-30) 100 UNIT/ML ~~LOC~~ SUPN: PEN_INJECTOR | SUBCUTANEOUS | 5 refills | Status: AC

## 2024-06-08 MED ORDER — VANCOMYCIN HCL 125 MG PO CAPS: 125.0000 mg | ORAL_CAPSULE | Freq: Every day | ORAL | 0 refills | Status: AC

## 2024-06-08 MED ORDER — ERTAPENEM IV (FOR PTA / DISCHARGE USE ONLY): 1.0000 g | INTRAVENOUS | 0 refills | Status: DC

## 2024-06-08 MED ORDER — OXYCODONE-ACETAMINOPHEN 10-325 MG PO TABS: 1.0000 | ORAL_TABLET | Freq: Four times a day (QID) | ORAL | Status: AC | PRN

## 2024-06-08 MED ORDER — MAGIC MOUTHWASH: 5.0000 mL | Freq: Three times a day (TID) | ORAL | 0 refills | Status: AC | PRN

## 2024-06-08 MED ORDER — ERTAPENEM IV (FOR PTA / DISCHARGE USE ONLY): 1.0000 g | INTRAVENOUS | 0 refills | Status: AC

## 2024-06-08 MED ORDER — OXYCODONE-ACETAMINOPHEN 10-325 MG PO TABS: 1.0000 | ORAL_TABLET | Freq: Four times a day (QID) | ORAL | 0 refills | Status: DC | PRN

## 2024-06-08 MED ORDER — SODIUM CHLORIDE 0.9 % IV SOLN: 1.0000 g | INTRAVENOUS | 0 refills | Status: DC

## 2024-06-08 NOTE — Progress Notes (Signed)
 Spoke to [employer] at [facility] and gave report at 1630. All questions and concerns were answered. Patient had all personal belongings, including discharge paperwork in possession. Patient was wheeled down to [facility].

## 2024-06-08 NOTE — Inpatient Diabetes Management (Signed)
 Inpatient Diabetes Program Recommendations  AACE/ADA: New Consensus Statement on Inpatient Glycemic Control (2015)  Target Ranges:  Prepandial:   less than 140 mg/dL      Peak postprandial:   less than 180 mg/dL (1-2 hours)      Critically ill patients:  140 - 180 mg/dL   Lab Results  Component Value Date   GLUCAP 82 06/08/2024   HGBA1C 8.9 (H) 03/11/2024    Review of Glycemic Control  Latest Reference Range & Units 06/07/24 07:56 06/07/24 11:12 06/07/24 13:06 06/07/24 13:21 06/07/24 16:23 06/07/24 20:55 06/08/24 07:29  Glucose-Capillary 70 - 99 mg/dL 751 (H) 797 (H) 53 (L) 92 128 (H) 119 (H) 82   Diabetes history: DM 2 Outpatient Diabetes medications: 70/30 30 units BID, Mounjaro  15 mg Qweek Current orders for Inpatient glycemic control: 70/30 25 units q AM and 10 units q PM, Novolog  0-15 units TID with meals, Novolog  0-5 units QHS  Inpatient Diabetes Program Recommendations:    Note low blood sugar yesterday.  Agree with reduction in 70/30 insulin .   Thanks,  Randall Bullocks, RN, BC-ADM Inpatient Diabetes Coordinator Pager [phone]  (8a-5p)

## 2024-06-08 NOTE — Progress Notes (Signed)
 Physical Therapy Treatment Patient Details Name: Jaquelin Meaney MRN: [id number] DOB: 09-Aug-1956 Today's Date: 06/08/2024   History of Present Illness Azhane Eckart is a 68 y.o. female with medical history significant for autonomic dysfunction, diabetes mellitus, hypertension, atrial fibrillation.  Patient presented to the ED with complaints of several falls, generalized weakness.  EMS reports blood glucose of 534.  Patient reports she has been on treatment for her UTI with antibiotics, her last dose was yesterday, she does not remember the name of the medication.  She was in the ED 10/23, was given a dose of ceftriaxone , to complete a course of Keflex .    PT Comments  Pt. Tolerated session, was highly motivated with today's session. Pt was able to perform bed mobility with supervision/ CGA, and for transfers and ambulation pt. Requires CGA/ Min A w/RW. During ambulation pt felt dizzy/ unsteady and fatigued. We return pt to chair with call bell. Nursing staff was notified on pt status. Patient will benefit from continued skilled physical therapy in hospital and recommended venue below to continue to increase strength, balance, endurance for safe ADLs and gait.     If plan is discharge home, recommend the following: Help with stairs or ramp for entrance;Assistance with cooking/housework;A little help with walking and/or transfers;A little help with bathing/dressing/bathroom;Assist for transportation   Can travel by private vehicle     Yes  Equipment Recommendations       Recommendations for Other Services       Precautions / Restrictions Precautions Precautions: Fall Recall of Precautions/Restrictions: Intact Restrictions Weight Bearing Restrictions Per Provider Order: No     Mobility  Bed Mobility Overal bed mobility: Needs Assistance Bed Mobility: Supine to Sit     Supine to sit: Supervision, Contact guard, HOB elevated Sit to supine: HOB elevated, Used rails, Contact guard  assist   General bed mobility comments: HOB slightly elevated as pt has hospital bed, slow labored movement, inc time needed, CGA/supervision for safety    Transfers Overall transfer level: Needs assistance Equipment used: Rolling walker (2 wheels) Transfers: Sit to/from Stand, Bed to chair/wheelchair/BSC Sit to Stand: Min assist, From elevated surface, Contact guard assist   Step pivot transfers: Contact guard assist       General transfer comment: Pt. required GCA w/RW for transfer from bed to chair due to mild dizziness when standing    Ambulation/Gait Ambulation/Gait assistance: Contact guard assist Gait Distance (Feet): 29 Feet Assistive device: Rolling walker (2 wheels) Gait Pattern/deviations: Step-to pattern, Decreased step length - left, Decreased step length - right, Decreased stride length, Trunk flexed Gait velocity: dec     General Gait Details: Pt. was able to ambulate with RW, and had to take a  break and felt dizzy during ambulation. Pt wanted to return to room.   Stairs             Wheelchair Mobility     Tilt Bed    Modified Rankin (Stroke Patients Only)       Balance Overall balance assessment: Needs assistance Sitting-balance support: Feet supported, No upper extremity supported Sitting balance-Leahy Scale: Fair Sitting balance - Comments: fair to good seated EOB w?RW   Standing balance support: Reliant on assistive device for balance, During functional activity, Bilateral upper extremity supported Standing balance-Leahy Scale: Poor Standing balance comment: w/ RW felt mildly dizzy                            Communication  Communication Communication: No apparent difficulties  Cognition Arousal: Alert Behavior During Therapy: WFL for tasks assessed/performed   PT - Cognitive impairments: No apparent impairments                         Following commands: Intact      Cueing Cueing Techniques: Verbal cues   Exercises General Exercises - Lower Extremity Straight Leg Raises: Seated, AROM, Strengthening, Both, 10 reps Hip Flexion/Marching: Seated, AROM, Strengthening, Both, 10 reps Toe Raises: Seated, AROM, Strengthening, Both, 10 reps Heel Raises: Seated, AROM, Strengthening, Both, 10 reps    General Comments        Pertinent Vitals/Pain Pain Assessment Pain Assessment: 0-10 Pain Score: 6  Pain Location: low back and LE Pain Descriptors / Indicators: Aching, Sore, Discomfort Pain Intervention(s): Monitored during session, Repositioned    Home Living                          Prior Function            PT Goals (current goals can now be found in the care plan section) Acute Rehab PT Goals Patient Stated Goal: Return home PT Goal Formulation: With patient Time For Goal Achievement: 06/13/24 Potential to Achieve Goals: Good    Frequency    Min 3X/week      PT Plan      Co-evaluation              AM-PAC PT 6 Clicks Mobility   Outcome Measure  Help needed turning from your back to your side while in a flat bed without using bedrails?: A Little Help needed moving from lying on your back to sitting on the side of a flat bed without using bedrails?: A Little Help needed moving to and from a bed to a chair (including a wheelchair)?: A Little Help needed standing up from a chair using your arms (e.g., wheelchair or bedside chair)?: A Lot Help needed to walk in hospital room?: A Little Help needed climbing 3-5 steps with a railing? : A Lot 6 Click Score: 16    End of Session   Activity Tolerance: Patient tolerated treatment well;Patient limited by fatigue Patient left: in chair;with call bell/phone within reach;with chair alarm set Nurse Communication: Mobility status;Other (comment) PT Visit Diagnosis: Unsteadiness on feet (R26.81);Muscle weakness (generalized) (M62.81);Repeated falls (R29.6);Other abnormalities of gait and mobility (R26.89);Difficulty  in walking, not elsewhere classified (R26.2)     Time: 8885-8860 PT Time Calculation (min) (ACUTE ONLY): 25 min  Charges:    $Gait Training: 8-22 mins $Therapeutic Exercise: 8-22 mins PT General Charges $$ ACUTE PT VISIT: 1 Visit                     Corliss Lamartina, SPT

## 2024-06-08 NOTE — Progress Notes (Signed)
 Tried calling report to [facility]. Was transferred from the main number and no one picked up.

## 2024-06-08 NOTE — Discharge Summary (Addendum)
 Physician Discharge Summary  [name]:[id number] DOB: July 03, 1956 DOA: 06/05/2024  PCP: Edman Meade PEDLAR, FNP  Admit date: 06/05/2024 Discharge date: 06/08/2024  Admitted From: Home Disposition: SNF  Recommendations for Outpatient Follow-up:  Follow up with PCP in 1 week with repeat CBC/BMP Follow up in ED if symptoms worsen or new appear Follow-up with urology for repeat UTI and bladder outlet obstruction, hydronephrosis Foley catheter placed due to distended bladder and hydronephrosis.  Will keep Foley on discharge.  Recommend removal in 1 to 2 weeks.   Home Health: No Equipment/Devices: None  Discharge Condition: Stable CODE STATUS: Full Diet recommendation: Heart healthy  Brief/Interim Summary:  Amy Livingston is a 68 y.o. female with medical history significant for autonomic dysfunction, diabetes mellitus, hypertension, atrial fibrillation.  She also has history of bilateral hydronephrosis, bladder outlet obstruction, urinary incontinence, recurrent UTIs with ESBL Klebsiella. Patient presented to the ED with complaints of several falls, generalized weakness.  EMS reports blood glucose of 534.   Recent hospitalization 10/6 to 10/9 for urinary tract infection-cultures grew Klebsiella.  This was sensitive to most of the antibiotics and she completed a course of Keflex .  Patient also has history of C. difficile.    ED Course: She was afebrile in the ER.  Vital signs are stable.  Lactic acid normal.  CT renal stone study-bilateral hydroureteronephrosis, progressed on the left and new on the right, diffuse bilateral perinephric edema, moderate bladder distention and bladder wall thickening.  Findings suspicious for bladder outlet obstruction.  UA concerning for UTI.  Patient initiated on IV antibiotics and admitted for further management   Patient admitted for complicated UTI/CT findings as above in the setting of bladder outlet obstruction   Complicated urinary tract  infection-with bilateral hydro ureteric nephrosis, bladder outlet obstruction, pyelonephritis, patient hemodynamically stable.  Recurrent ESBL Klebsiella bacteriuria. - Urine culture is positive for ESBL Klebsiella again.  Blood culture is negative.  Patient remains afebrile.  Midline placed 10/30, plan for 7 days of IV Invanz  at SNF.  Stop date 06/13/2024 -Continue C diff prophylaxis with vancomycin  given previous history. - Patient was placed on Foley catheter with good drainage of urine.  Ultrasound obtained to follow-up on hydronephrosis, shows persistent hydronephrosis on both sides.  She is advised to keep Foley catheter for about 1 week and discontinue.  She need to follow-up with urology in next 1 to 2 weeks.  She need to follow-up with primary care provider as well.    Bladder outlet obstruction-chronic problem.  Continue outpatient follow-up.  Multiple falls, chronic back pain - MRI lumbar spine is negative for acute abnormalities. -Resume gabapentin .   Paroxysmal atrial fibrillation-rate controlled and on anticoagulation with Eliquis  - Resume Eliquis    Hypomagnesemia, hyponatremia sodium 130, magnesium  1.5 on presentation.  Improved.  Chronic anemia: Hemoglobin 7.0, multifactorial with chronic illness. She received 1 unit PRBC transfusion which improved her hemoglobin to 8.8.  Need to continue outpatient monitoring.    Hypertension-stable. Not on medications.   Diabetes mellitus with hyperglycemia-uncontrolled.  A1c 8.9. - SSI- M - NPH adjusted to 15 units twice daily,  Anxiety disorder - Resume buspirone , Zoloft   Pressure injury, sacrum stage I, POA Wound 06/06/24 1030 Pressure Injury Sacrum Stage 1 -  Intact skin with non-blanchable redness of a localized area usually over a bony prominence. (Active)       DVT prophylaxis: Lovenox Code Status: FULL Family Communication: none at bedside Disposition Plan: SNF Consults called: ID Admission status: Inpt med surg I  certify that  at the point of admission it is my clinical judgment that the patient will require inpatient hospital care spanning beyond 2 midnights from the point of admission due to high intensity of service, high risk for further deterioration and high frequency of surveillance required.         Discharge Diagnoses:  Principal Problem:   Urinary tract infection due to ESBL Klebsiella Active Problems:   Hyponatremia   Hypomagnesemia   Bladder outlet obstruction   Uncontrolled type 2 diabetes mellitus with hyperglycemia, with long-term current use of insulin  (HCC)   Type 2 diabetes mellitus with hyperglycemia (HCC)   PAF (paroxysmal atrial fibrillation) (HCC)   Hypertension   Overactive bladder   Essential hypertension   Peripheral neuropathy   Paroxysmal atrial fibrillation (HCC)   Pressure injury of skin    Discharge Instructions  Discharge Instructions     Ambulatory referral to Urology   Complete by: As directed    Bladder outlet obstruction/repeated UTI/hydronephrosis/placed on Foley catheter.   Call MD for:  persistant nausea and vomiting   Complete by: As directed    Call MD for:  severe uncontrolled pain   Complete by: As directed    Call MD for:  temperature >100.4   Complete by: As directed    Diet - low sodium heart healthy   Complete by: As directed    Discharge instructions   Complete by: As directed    1.  Continue IV ertapenem  through 06/13/2024 2.  Decrease NPH insulin  to 15 unit twice daily. 3.  Continue Foley catheter.  Follow-up with PCP in 1 week.  Follow-up with urology in 1 to 2 weeks 4.   Discharge wound care:   Complete by: As directed    Home infusion instructions   Complete by: As directed    Instructions: Flushing of vascular access device: 0.9% NaCl pre/post medication administration and prn patency; Heparin 100 u/ml, 5ml for implanted ports and Heparin 10u/ml, 5ml for all other central venous catheters.   Increase activity slowly    Complete by: As directed       Allergies as of 06/08/2024       Reactions   Bee Venom Anaphylaxis   Has epi pen for this   Elemental Sulfur Itching, Rash   Yeast infections   Metformin  And Related Diarrhea   GI distress        Medication List     STOP taking these medications    cephALEXin  500 MG capsule Commonly known as: KEFLEX        TAKE these medications    Accu-Chek Guide Test test strip Generic drug: glucose blood USE TO TEST BLOOD SUGAR EVERY MORNING, AT NOON, AND EVERY NIGHT AT BEDTIME.   atorvastatin  80 MG tablet Commonly known as: LIPITOR TAKE 1 TABLET(80 MG) BY MOUTH DAILY What changed: See the new instructions.   cyclobenzaprine  10 MG tablet Commonly known as: FLEXERIL  Take 10 mg by mouth 3 (three) times daily as needed for muscle spasms.   Eliquis  5 MG Tabs tablet Generic drug: apixaban  Take 5 mg by mouth 2 (two) times daily.   ertapenem  IVPB Commonly known as: INVANZ  Inject 1 g into the vein daily. Indication: ESBL Klebsiella UTI First Dose: Yes Last Day of Therapy: 06/13/2024 Labs - Once weekly:  CBC/D and BMP,   estradiol  0.1 MG/GM vaginal cream Commonly known as: ESTRACE  Place 0.5 g vaginally 2 (two) times a week. Place 0.5g nightly for two weeks then twice a week after  famotidine  20 MG tablet Commonly known as: PEPCID  Take 1 tablet (20 mg total) by mouth daily.   gabapentin  300 MG capsule Commonly known as: NEURONTIN  Take 1 capsule (300 mg total) by mouth 3 (three) times daily.   guaiFENesin  100 MG/5ML liquid Commonly known as: ROBITUSSIN Take 5 mLs by mouth every 4 (four) hours as needed for cough or to loosen phlegm.   magic mouthwash Soln Take 5 mLs by mouth 3 (three) times daily as needed for up to 8 days for mouth pain. Suspension contains equal amounts of Maalox Extra Strength, nystatin, and diphenhydramine.   meclizine  25 MG tablet Commonly known as: ANTIVERT  Take 1 tablet (25 mg total) by mouth daily as needed for  dizziness.   mometasone  0.1 % cream Commonly known as: ELOCON  Apply topically daily as needed for itch What changed:  how much to take how to take this when to take this reasons to take this   multivitamin with minerals Tabs tablet Take 1 tablet by mouth daily.   NovoLIN  70/30 Kwikpen (70-30) 100 UNIT/ML KwikPen Generic drug: insulin  isophane & regular human KwikPen Inject 15 Units into the skin 2 (two) times daily with a meal. What changed: additional instructions   ondansetron  4 MG tablet Commonly known as: ZOFRAN  Take 1 tablet (4 mg total) by mouth every 6 (six) hours as needed for nausea. What changed: when to take this   oxyCODONE -acetaminophen  10-325 MG tablet Commonly known as: PERCOCET Take 1 tablet by mouth every 8 (eight) hours as needed for pain.   sertraline  50 MG tablet Commonly known as: ZOLOFT  TAKE 1 TABLET(50 MG) BY MOUTH DAILY   sucralfate  1 g tablet Commonly known as: Carafate  Take 1 tablet (1 g total) by mouth 4 (four) times daily -  with meals and at bedtime.   tirzepatide  15 MG/0.5ML Pen Commonly known as: MOUNJARO  Inject 15 mg into the skin once a week. What changed: when to take this   traZODone  100 MG tablet Commonly known as: DESYREL  Take 1 tablet (100 mg total) by mouth at bedtime.   vancomycin  125 MG capsule Commonly known as: VANCOCIN  Take 1 capsule (125 mg total) by mouth daily. Start taking on: June 09, 2024   vitamin C  100 MG tablet Take 1 tablet (100 mg total) by mouth daily.               Home Infusion Instuctions  (From admission, onward)           Start     Ordered   06/08/24 0000  Home infusion instructions       Question:  Instructions  Answer:  Flushing of vascular access device: 0.9% NaCl pre/post medication administration and prn patency; Heparin 100 u/ml, 5ml for implanted ports and Heparin 10u/ml, 5ml for all other central venous catheters.   06/08/24 1106              Discharge Care  Instructions  (From admission, onward)           Start     Ordered   06/08/24 0000  Discharge wound care:        06/08/24 1122            Contact information for follow-up providers     Bacchus, Meade PEDLAR, FNP. Schedule an appointment as soon as possible for a visit in 1 week(s).   Specialty: Family Medicine Contact information: [address] [place] [address] [phone]  [facility]. Schedule an appointment as soon as possible for a visit in 1 week(s).   Contact information: [address] [place]  [address] [phone]             Contact information for after-discharge care     Destination     [facility] .   Service: Skilled Nursing Contact information: [address] [facility] [place]  [address] [phone]                    Allergies  Allergen Reactions   Bee Venom Anaphylaxis    Has epi pen for this   Elemental Sulfur Itching and Rash    Yeast infections   Metformin  And Related Diarrhea    GI distress    Consultations:    Procedures/Studies: US  RENAL Result Date: 06/07/2024 EXAM: US  Retroperitoneum Complete, Renal. CLINICAL HISTORY: Hydronephrosis, cholecystectomy. TECHNIQUE: Real-time ultrasound of the retroperitoneum (complete) with image documentation. COMPARISON: US  Renal 03/12/2024. CT 06/05/2024 FINDINGS: RIGHT KIDNEY: Measures 13.0 x 7.0 x 6.7 cm., volume of 321.9 ml. Echogenicity is within normal limits. There is moderate right-sided hydronephrosis which is new or increased compared to the prior ultrasound from August and is probably slightly decreased compared to the CT from 10/28. No renal stone or mass visualized. LEFT KIDNEY: Measures 12.9 x 7.2 x 5.9 cm. , volume of 286.7 ml Renal cortical echogenicity is normal. There is mild-to-moderate hydronephrosis of the left kidney which is probably decreased compared to the prior CT and also  appears decreased compared to the prior ultrasound. No renal stone or mass visualized. BLADDER: There is a Foley catheter within the bladder, which is empty. IMPRESSION: 1. Moderate right-sided hydronephrosis, new or increased compared to prior ultrasound, and slightly decreased compared to recent prior CT. 2. Mild-to-moderate left-sided hydronephrosis, decreased compared to prior CT and prior ultrasound. Electronically signed by: Luke Bun MD 06/07/2024 04:23 PM EDT RP Workstation: [id number]   MR SACRUM SI JOINTS WO CONTRAST Result Date: 06/06/2024 EXAM: MR Sacrum without intravenous contrast. 06/06/2024 09:23:47 AM TECHNIQUE: Multiplanar multi-sequence MR imaging of the sacrum was performed. No intravenous contrast was administered. COMPARISON: CT pelvis 06/05/2024. CLINICAL HISTORY: Urinary retention, fecal incontinence, bilateral lower extremity weakness, chronic back pain, history of lumbar surgery. FINDINGS: SOFT TISSUES: Mild lower presacral edema. BONES: Acquired interbody fusion at L4-L5 with prominent loss of intervertebral disc height at L5-S1. No acute fracture or focal osseous lesion. JOINTS: The sacroiliac joints appear normal and symmetric. LIMITED INTRAPELVIC CONTENTS: A catheter is present in the urinary bladder along with fluid and gas in the urinary bladder. Bladder wall thickening may be from nondistention although cystitis is not excluded. Sigmoid colon diverticulosis. Bilateral distal hydroureter. Trace symmetric edema along the deep margins of the iliac small vessels, nonspecific. No impinging lesion along the sacral plexus or proximal sciatic nerves. IMPRESSION: 1. Mild lower presacral edema. 2. Acquired interbody fusion at L4-5 with prominent loss of intervertebral disc height at L5-S1. 3. Bilateral distal hydroureter. 4. Bladder wall thickening potentially from cystitis or non-distention. A foley catheter is in place. 5. Sigmoid colon diverticulosis. Electronically signed by: Ryan Salvage MD 06/06/2024 04:58 PM EDT RP Workstation: [id number]   MR LUMBAR SPINE WO CONTRAST Result Date: 06/06/2024 EXAM: MRI LUMBAR SPINE 06/06/2024 09:23:47 AM TECHNIQUE: Multiplanar multisequence MRI of the lumbar spine was performed without the administration of intravenous contrast. COMPARISON: Lumbar radiographs 11/07/2019. CT abdomen and pelvis 06/05/2024. Lumbar MRI 04/01/2016. CLINICAL HISTORY: 68 year old female. Urinary  retention, fecal incontinence, bilateral lower extremity weakness. Chronic back pain and back surgeries. FINDINGS: BONES AND ALIGNMENT: Normal lumbar segmentation. Chronic lumbar fusion beginning at L3, with L3-L4 posterior and interbody hardware still in place, evidence of solid arthrodesis on CT 06/05/2024 through to the sacrum. Stable vertebral height and alignment since the previous MRI. Normal background bone marrow signal. Mild hardware susceptibility artifact. No marrow edema. Intact visible sacrum. SPINAL CORD: The conus terminates normally at T12. SOFT TISSUES: No paraspinal mass. Visible abdominal viscera stable from details of CT abdomen and pelvis 06/05/2024. Mild lower thoracic disc bulging through T12-L1. No lower thoracic spinal stenosis. L1-L2: Mild disc bulging. Mild to moderate facet and ligamentum flavum hypertrophy. No stenosis. L2-L3: Adjacent segment disc space loss, circumferential disc bulge, broad based posterior component. Moderate facet and ligamentum flavum hypertrophy. Epidural lipomatosis has regressed since the previous MRI and spinal canal patency is improved. Borderline to mild spinal stenosis. Mild left and moderate right L2 neural foraminal stenosis. The foraminal narrowing has not significantly changed. L3-L4 THROUGH L5-S1: Chronic decompression and fusion L3-L4 through L5-S1 with solid arthrodesis. IMPRESSION: 1. Previous lumbar fusion L3 through sacrum without adverse features. 2. Adjacent segment degeneration at L2-L3 with borderline spinal  stenosis improved from 2017 MRI. Stable mild left and moderate right L2 foraminal stenosis. Electronically signed by: Helayne Hurst MD 06/06/2024 10:03 AM EDT RP Workstation: [id number]   CT Renal Stone Study Result Date: 06/05/2024 CLINICAL DATA:  Provided history: Abdominal/flank pain, stone suspected Patient reports weakness and multiple falls. EXAM: CT ABDOMEN AND PELVIS WITHOUT CONTRAST TECHNIQUE: Multidetector CT imaging of the abdomen and pelvis was performed following the standard protocol without IV contrast. RADIATION DOSE REDUCTION: This exam was performed according to the departmental dose-optimization program which includes automated exposure control, adjustment of the mA and/or kV according to patient size and/or use of iterative reconstruction technique. COMPARISON:  CT 10/08/2023 FINDINGS: Lower chest: The lung bases are clear. Hepatobiliary: No focal liver abnormality is seen. Punctate granuloma in the inferior right lobe of the liver status post cholecystectomy. No biliary dilatation. Pancreas: No ductal dilatation or inflammation. Spleen: Normal in size without focal abnormality. Adrenals/Urinary Tract: Normal adrenal glands. Bilateral hydroureteronephrosis. On the left this has progressed, and new hydronephrosis on the right. The left renal collecting system is slightly more dilated than the right. Ureters are dilated and tortuous to the bladder insertion. There is diffuse bilateral perinephric edema. Possible punctate nonobstructing stone in the lower left kidney. Moderate bladder distension with mild trabeculation and wall thickening. 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 Stomach/Bowel: The stomach is nondistended. No bowel obstruction or inflammation. Normal appendix. Moderate colonic stool burden. Distal colonic diverticulosis without focal diverticulitis. Vascular/Lymphatic: Aortic and branch atherosclerosis. There is retroperitoneal adenopathy at the level of the kidneys, greater on the left.  Representative lymph node measures 12 mm short axis series 11, image 36. Reproductive: Status post hysterectomy. No adnexal masses. Other: Retroperitoneal and perinephric edema. No significant ascites. No free air. Musculoskeletal: Degenerative and postsurgical change in the spine. There are no acute or suspicious osseous abnormalities. IMPRESSION: 1. Bilateral hydroureteronephrosis, progressed on the left and new on the right. There is diffuse bilateral perinephric edema. Moderate bladder distension with mild trabeculation and wall thickening. 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 Findings are suspicious for bladder outlet obstruction. 2. Retroperitoneal and perinephric edema may be due to obstructive uropathy, however recommend correlation with urinalysis to exclude urinary tract infection. 3. Retroperitoneal adenopathy at the level of the kidneys, greater on the left, indeterminate in etiology. 4. Colonic diverticulosis  without focal diverticulitis. Aortic Atherosclerosis (ICD10-I70.0). Electronically Signed   By: Andrea Gasman M.D.   On: 06/05/2024 16:52   CT Head Wo Contrast Result Date: 06/05/2024 CLINICAL DATA:  Syncope/presyncope, cerebrovascular cause suspected EXAM: CT HEAD WITHOUT CONTRAST TECHNIQUE: Contiguous axial images were obtained from the base of the skull through the vertex without intravenous contrast. RADIATION DOSE REDUCTION: This exam was performed according to the departmental dose-optimization program which includes automated exposure control, adjustment of the mA and/or kV according to patient size and/or use of iterative reconstruction technique. COMPARISON:  10/08/2023 FINDINGS: Brain: No intracranial hemorrhage, mass effect, or midline shift. No hydrocephalus. The basilar cisterns are patent. 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 No evidence of territorial infarct or acute ischemia. No extra-axial or intracranial fluid collection. Vascular:  Atherosclerosis of skullbase vasculature without hyperdense vessel or abnormal calcification. Skull: No fracture or suspicious lesion. Sinuses/Orbits: Paranasal sinuses and mastoid air cells are clear. The visualized orbits are unremarkable. Other: None. IMPRESSION: No acute intracranial abnormality. Electronically Signed   By: Andrea Gasman M.D.   On: 06/05/2024 16:35   DG Chest Port 1 View Result Date: 06/05/2024 EXAM: 1 VIEW XRAY OF THE CHEST 06/05/2024 02:13:24 PM COMPARISON: 11/05/2023 CLINICAL HISTORY: weakness. weakness, fall and hyperglycemia. Pt reports she has been weak recently and has been falling daily. PT cbg in route was 534. FINDINGS: LUNGS AND PLEURA: No focal pulmonary opacity. No pulmonary edema. No pleural effusion. No pneumothorax. HEART AND MEDIASTINUM: No acute abnormality of the cardiac and mediastinal silhouettes. BONES AND SOFT TISSUES: No acute osseous abnormality. IMPRESSION: 1. No acute cardiopulmonary abnormality. Electronically signed by: Lynwood Seip MD 06/05/2024 02:18 PM EDT RP Workstation: [id number]   DG Hip Unilat With Pelvis 2-3 Views Right Result Date: 05/31/2024 CLINICAL DATA:  Fall, pain EXAM: DG HIP (WITH OR WITHOUT PELVIS) 2-3V RIGHT COMPARISON:  None Available. FINDINGS: There is no evidence of hip fracture or dislocation. There is no evidence of arthropathy or other focal bone abnormality. IMPRESSION: Negative. Electronically Signed   By: Franky Crease M.D.   On: 05/31/2024 17:39      Subjective:   Discharge Exam: Vitals:   06/08/24 0536 06/08/24 1320  BP: 139/67 109/70  Pulse: 93 (!) 109  Resp: 15 18  Temp: 98.5 F (36.9 C) 98.7 F (37.1 C)  SpO2: 94% 96%    General: Pt is alert, awake, not in acute distress Cardiovascular: rate controlled, S1/S2 + Respiratory: bilateral decreased breath sounds at bases Abdominal: Soft, NT, ND, bowel sounds + Extremities: no edema, no cyanosis    The results of significant diagnostics from this  hospitalization (including imaging, microbiology, ancillary and laboratory) are listed below for reference.     Microbiology: Recent Results (from the past 240 hours)  Urine Culture     Status: Abnormal   Collection Time: 05/31/24  4:32 PM   Specimen: Urine, Random  Result Value Ref Range Status   Specimen Description   Final    URINE, RANDOM Performed at [facility], [address], [place], [place] [address]    Special Requests   Final    NONE Reflexed from [id number] Performed at [facility], [address], [place], [place] [address]    Culture (A)  Final    >=100,000 COLONIES/mL KLEBSIELLA PNEUMONIAE Confirmed Extended Spectrum Beta-Lactamase Producer (ESBL).  In bloodstream infections from ESBL organisms, carbapenems are preferred over piperacillin /tazobactam. They are shown to have a lower risk of mortality.    Report  Status 06/02/2024 FINAL  Final   Organism ID, Bacteria KLEBSIELLA PNEUMONIAE (A)  Final      Susceptibility   Klebsiella pneumoniae - MIC*    AMPICILLIN >=32 RESISTANT Resistant     CEFAZOLIN  (URINE) Value in next row Resistant      >=32 RESISTANTThis is a modified FDA-approved test that has been validated and its performance characteristics determined by the reporting laboratory.  This laboratory is certified under the Clinical Laboratory Improvement Amendments CLIA as qualified to perform high complexity clinical laboratory testing.    CEFEPIME  Value in next row Resistant      >=32 RESISTANTThis is a modified FDA-approved test that has been validated and its performance characteristics determined by the reporting laboratory.  This laboratory is certified under the Clinical Laboratory Improvement Amendments CLIA as qualified to perform high complexity clinical laboratory testing.    ERTAPENEM  Value in next row Sensitive      >=32 RESISTANTThis is a modified FDA-approved test that has been validated and its performance characteristics determined by the reporting  laboratory.  This laboratory is certified under the Clinical Laboratory Improvement Amendments CLIA as qualified to perform high complexity clinical laboratory testing.    CEFTRIAXONE  Value in next row Resistant      >=32 RESISTANTThis is a modified FDA-approved test that has been validated and its performance characteristics determined by the reporting laboratory.  This laboratory is certified under the Clinical Laboratory Improvement Amendments CLIA as qualified to perform high complexity clinical laboratory testing.    CIPROFLOXACIN Value in next row Resistant      >=32 RESISTANTThis is a modified FDA-approved test that has been validated and its performance characteristics determined by the reporting laboratory.  This laboratory is certified under the Clinical Laboratory Improvement Amendments CLIA as qualified to perform high complexity clinical laboratory testing.    GENTAMICIN Value in next row Resistant      >=32 RESISTANTThis is a modified FDA-approved test that has been validated and its performance characteristics determined by the reporting laboratory.  This laboratory is certified under the Clinical Laboratory Improvement Amendments CLIA as qualified to perform high complexity clinical laboratory testing.    NITROFURANTOIN  Value in next row Resistant      >=32 RESISTANTThis is a modified FDA-approved test that has been validated and its performance characteristics determined by the reporting laboratory.  This laboratory is certified under the Clinical Laboratory Improvement Amendments CLIA as qualified to perform high complexity clinical laboratory testing.    TRIMETH/SULFA Value in next row Resistant      >=32 RESISTANTThis is a modified FDA-approved test that has been validated and its performance characteristics determined by the reporting laboratory.  This laboratory is certified under the Clinical Laboratory Improvement Amendments CLIA as qualified to perform high complexity clinical  laboratory testing.    AMPICILLIN/SULBACTAM Value in next row Resistant      >=32 RESISTANTThis is a modified FDA-approved test that has been validated and its performance characteristics determined by the reporting laboratory.  This laboratory is certified under the Clinical Laboratory Improvement Amendments CLIA as qualified to perform high complexity clinical laboratory testing.    PIP/TAZO Value in next row Sensitive      16 SENSITIVEThis is a modified FDA-approved test that has been validated and its performance characteristics determined by the reporting laboratory.  This laboratory is certified under the Clinical Laboratory Improvement Amendments CLIA as qualified to perform high complexity clinical laboratory testing.    MEROPENEM  Value in next row Sensitive  16 SENSITIVEThis is a modified FDA-approved test that has been validated and its performance characteristics determined by the reporting laboratory.  This laboratory is certified under the Clinical Laboratory Improvement Amendments CLIA as qualified to perform high complexity clinical laboratory testing.    * >=100,000 COLONIES/mL KLEBSIELLA PNEUMONIAE  Culture, blood (routine x 2)     Status: None (Preliminary result)   Collection Time: 06/05/24  2:55 PM   Specimen: BLOOD  Result Value Ref Range Status   Specimen Description BLOOD RIGHT ANTECUBITAL  Final   Special Requests   Final    BOTTLES DRAWN AEROBIC AND ANAEROBIC Blood Culture adequate volume   Culture   Final    NO GROWTH 3 DAYS Performed at [facility], [address] [place], [place] [address]    Report Status PENDING  Incomplete  Culture, blood (routine x 2)     Status: None (Preliminary result)   Collection Time: 06/05/24  2:55 PM   Specimen: BLOOD  Result Value Ref Range Status   Specimen Description BLOOD RIGHT ANTECUBITAL  Final   Special Requests   Final    BOTTLES DRAWN AEROBIC ONLY Blood Culture results may not be optimal due to an inadequate volume of  blood received in culture bottles   Culture   Final    NO GROWTH 3 DAYS Performed at [facility], [address] [place], [place] [address]    Report Status PENDING  Incomplete  Urine Culture     Status: Abnormal   Collection Time: 06/05/24  5:08 PM   Specimen: Urine, Catheterized  Result Value Ref Range Status   Specimen Description   Final    URINE, CATHETERIZED Performed at [facility], [address], [place], [place] [address]    Special Requests   Final    NONE Performed at [facility], [address] [place], [place] [address]    Culture (A)  Final    >=100,000 COLONIES/mL KLEBSIELLA PNEUMONIAE Confirmed Extended Spectrum Beta-Lactamase Producer (ESBL).  In bloodstream infections from ESBL organisms, carbapenems are preferred over piperacillin /tazobactam. They are shown to have a lower risk of mortality.    Report Status 06/07/2024 FINAL  Final   Organism ID, Bacteria KLEBSIELLA PNEUMONIAE (A)  Final      Susceptibility   Klebsiella pneumoniae - MIC*    AMPICILLIN >=32 RESISTANT Resistant     CEFAZOLIN  (URINE) Value in next row Resistant      >=32 RESISTANTThis is a modified FDA-approved test that has been validated and its performance characteristics determined by the reporting laboratory.  This laboratory is certified under the Clinical Laboratory Improvement Amendments CLIA as qualified to perform high complexity clinical laboratory testing.    CEFEPIME  Value in next row Resistant      >=32 RESISTANTThis is a modified FDA-approved test that has been validated and its performance characteristics determined by the reporting laboratory.  This laboratory is certified under the Clinical Laboratory Improvement Amendments CLIA as qualified to perform high complexity clinical laboratory testing.    ERTAPENEM  Value in next row Sensitive      >=32 RESISTANTThis is a modified FDA-approved test that has been validated and its performance characteristics determined by the reporting  laboratory.  This laboratory is certified under the Clinical Laboratory Improvement Amendments CLIA as qualified to perform high complexity clinical laboratory testing.    CEFTRIAXONE  Value in next row Resistant      >=32 RESISTANTThis is a modified FDA-approved test that has been validated and its performance characteristics determined by  the reporting laboratory.  This laboratory is certified under the Clinical Laboratory Improvement Amendments CLIA as qualified to perform high complexity clinical laboratory testing.    CIPROFLOXACIN Value in next row Resistant      >=32 RESISTANTThis is a modified FDA-approved test that has been validated and its performance characteristics determined by the reporting laboratory.  This laboratory is certified under the Clinical Laboratory Improvement Amendments CLIA as qualified to perform high complexity clinical laboratory testing.    GENTAMICIN Value in next row Resistant      >=32 RESISTANTThis is a modified FDA-approved test that has been validated and its performance characteristics determined by the reporting laboratory.  This laboratory is certified under the Clinical Laboratory Improvement Amendments CLIA as qualified to perform high complexity clinical laboratory testing.    NITROFURANTOIN  Value in next row Resistant      >=32 RESISTANTThis is a modified FDA-approved test that has been validated and its performance characteristics determined by the reporting laboratory.  This laboratory is certified under the Clinical Laboratory Improvement Amendments CLIA as qualified to perform high complexity clinical laboratory testing.    TRIMETH/SULFA Value in next row Resistant      >=32 RESISTANTThis is a modified FDA-approved test that has been validated and its performance characteristics determined by the reporting laboratory.  This laboratory is certified under the Clinical Laboratory Improvement Amendments CLIA as qualified to perform high complexity clinical  laboratory testing.    AMPICILLIN/SULBACTAM Value in next row Resistant      >=32 RESISTANTThis is a modified FDA-approved test that has been validated and its performance characteristics determined by the reporting laboratory.  This laboratory is certified under the Clinical Laboratory Improvement Amendments CLIA as qualified to perform high complexity clinical laboratory testing.    PIP/TAZO Value in next row Sensitive      16 SENSITIVEThis is a modified FDA-approved test that has been validated and its performance characteristics determined by the reporting laboratory.  This laboratory is certified under the Clinical Laboratory Improvement Amendments CLIA as qualified to perform high complexity clinical laboratory testing.    MEROPENEM  Value in next row Sensitive      16 SENSITIVEThis is a modified FDA-approved test that has been validated and its performance characteristics determined by the reporting laboratory.  This laboratory is certified under the Clinical Laboratory Improvement Amendments CLIA as qualified to perform high complexity clinical laboratory testing.    * >=100,000 COLONIES/mL KLEBSIELLA PNEUMONIAE     Labs: BNP (last 3 results) Recent Labs    10/08/23 1929  BNP 39.0   Basic Metabolic Panel: Recent Labs  Lab 06/05/24 1455 06/06/24 0456 06/07/24 1003  NA 130* 133* 134*  K 4.3 4.3 4.2  CL 95* 98 100  CO2 26 28 25   GLUCOSE 456* 283* 295*  BUN 13 11 12   CREATININE 1.21* 1.10* 1.05*  CALCIUM  9.4 9.0 8.7*  MG 1.5*  --   --    Liver Function Tests: Recent Labs  Lab 06/05/24 1455  AST <10*  ALT <5  ALKPHOS 151*  BILITOT 0.3  PROT 6.5  ALBUMIN 2.9*   No results for input(s): LIPASE, AMYLASE in the last 168 hours. No results for input(s): AMMONIA in the last 168 hours. CBC: Recent Labs  Lab 06/05/24 1455 06/06/24 0456 06/07/24 1003 06/08/24 0927  WBC 8.9 9.2 7.6  --   NEUTROABS 6.2  --  4.6  --   HGB 7.2* 7.0* 7.0* 8.8*  HCT 23.9* 23.8* 23.2*  29.2*  MCV 82.1  81.8 82.3  --   PLT 340 307 314  --    Cardiac Enzymes: No results for input(s): CKTOTAL, CKMB, CKMBINDEX, TROPONINI in the last 168 hours. BNP: Invalid input(s): POCBNP CBG: Recent Labs  Lab 06/07/24 1321 06/07/24 1623 06/07/24 2055 06/08/24 0729 06/08/24 1105  GLUCAP 92 128* 119* 82 197*   D-Dimer No results for input(s): DDIMER in the last 72 hours. Hgb A1c No results for input(s): HGBA1C in the last 72 hours. Lipid Profile No results for input(s): CHOL, HDL, LDLCALC, TRIG, CHOLHDL, LDLDIRECT in the last 72 hours. Thyroid  function studies No results for input(s): TSH, T4TOTAL, T3FREE, THYROIDAB in the last 72 hours.  Invalid input(s): FREET3 Anemia work up No results for input(s): VITAMINB12, FOLATE, FERRITIN, TIBC, IRON, RETICCTPCT in the last 72 hours. Urinalysis    Component Value Date/Time   COLORURINE YELLOW 06/05/2024 1708   APPEARANCEUR CLOUDY (A) 06/05/2024 1708   APPEARANCEUR Cloudy (A) 11/18/2023 1145   LABSPEC 1.008 06/05/2024 1708   PHURINE 7.0 06/05/2024 1708   GLUCOSEU >=500 (A) 06/05/2024 1708   HGBUR LARGE (A) 06/05/2024 1708   BILIRUBINUR NEGATIVE 06/05/2024 1708   BILIRUBINUR negative 04/02/2024 1626   BILIRUBINUR Negative 11/18/2023 1145   KETONESUR NEGATIVE 06/05/2024 1708   PROTEINUR 100 (A) 06/05/2024 1708   UROBILINOGEN 0.2 04/02/2024 1626   NITRITE POSITIVE (A) 06/05/2024 1708   LEUKOCYTESUR LARGE (A) 06/05/2024 1708   Sepsis Labs Recent Labs  Lab 06/05/24 1455 06/06/24 0456 06/07/24 1003  WBC 8.9 9.2 7.6   Microbiology Recent Results (from the past 240 hours)  Urine Culture     Status: Abnormal   Collection Time: 05/31/24  4:32 PM   Specimen: Urine, Random  Result Value Ref Range Status   Specimen Description   Final    URINE, RANDOM Performed at [facility], [address] [place], [place] [address]    Special Requests   Final    NONE Reflexed from  [id number] Performed at [facility], [address], [place], [place] [address]    Culture (A)  Final    >=100,000 COLONIES/mL KLEBSIELLA PNEUMONIAE Confirmed Extended Spectrum Beta-Lactamase Producer (ESBL).  In bloodstream infections from ESBL organisms, carbapenems are preferred over piperacillin /tazobactam. They are shown to have a lower risk of mortality.    Report Status 06/02/2024 FINAL  Final   Organism ID, Bacteria KLEBSIELLA PNEUMONIAE (A)  Final      Susceptibility   Klebsiella pneumoniae - MIC*    AMPICILLIN >=32 RESISTANT Resistant     CEFAZOLIN  (URINE) Value in next row Resistant      >=32 RESISTANTThis is a modified FDA-approved test that has been validated and its performance characteristics determined by the reporting laboratory.  This laboratory is certified under the Clinical Laboratory Improvement Amendments CLIA as qualified to perform high complexity clinical laboratory testing.    CEFEPIME  Value in next row Resistant      >=32 RESISTANTThis is a modified FDA-approved test that has been validated and its performance characteristics determined by the reporting laboratory.  This laboratory is certified under the Clinical Laboratory Improvement Amendments CLIA as qualified to perform high complexity clinical laboratory testing.    ERTAPENEM  Value in next row Sensitive      >=32 RESISTANTThis is a modified FDA-approved test that has been validated and its performance characteristics determined by the reporting laboratory.  This laboratory is certified under the Clinical Laboratory Improvement Amendments CLIA as qualified to perform high complexity clinical laboratory testing.    CEFTRIAXONE  Value in  next row Resistant      >=32 RESISTANTThis is a modified FDA-approved test that has been validated and its performance characteristics determined by the reporting laboratory.  This laboratory is certified under the Clinical Laboratory Improvement Amendments CLIA as qualified to perform  high complexity clinical laboratory testing.    CIPROFLOXACIN Value in next row Resistant      >=32 RESISTANTThis is a modified FDA-approved test that has been validated and its performance characteristics determined by the reporting laboratory.  This laboratory is certified under the Clinical Laboratory Improvement Amendments CLIA as qualified to perform high complexity clinical laboratory testing.    GENTAMICIN Value in next row Resistant      >=32 RESISTANTThis is a modified FDA-approved test that has been validated and its performance characteristics determined by the reporting laboratory.  This laboratory is certified under the Clinical Laboratory Improvement Amendments CLIA as qualified to perform high complexity clinical laboratory testing.    NITROFURANTOIN  Value in next row Resistant      >=32 RESISTANTThis is a modified FDA-approved test that has been validated and its performance characteristics determined by the reporting laboratory.  This laboratory is certified under the Clinical Laboratory Improvement Amendments CLIA as qualified to perform high complexity clinical laboratory testing.    TRIMETH/SULFA Value in next row Resistant      >=32 RESISTANTThis is a modified FDA-approved test that has been validated and its performance characteristics determined by the reporting laboratory.  This laboratory is certified under the Clinical Laboratory Improvement Amendments CLIA as qualified to perform high complexity clinical laboratory testing.    AMPICILLIN/SULBACTAM Value in next row Resistant      >=32 RESISTANTThis is a modified FDA-approved test that has been validated and its performance characteristics determined by the reporting laboratory.  This laboratory is certified under the Clinical Laboratory Improvement Amendments CLIA as qualified to perform high complexity clinical laboratory testing.    PIP/TAZO Value in next row Sensitive      16 SENSITIVEThis is a modified FDA-approved test  that has been validated and its performance characteristics determined by the reporting laboratory.  This laboratory is certified under the Clinical Laboratory Improvement Amendments CLIA as qualified to perform high complexity clinical laboratory testing.    MEROPENEM  Value in next row Sensitive      16 SENSITIVEThis is a modified FDA-approved test that has been validated and its performance characteristics determined by the reporting laboratory.  This laboratory is certified under the Clinical Laboratory Improvement Amendments CLIA as qualified to perform high complexity clinical laboratory testing.    * >=100,000 COLONIES/mL KLEBSIELLA PNEUMONIAE  Culture, blood (routine x 2)     Status: None (Preliminary result)   Collection Time: 06/05/24  2:55 PM   Specimen: BLOOD  Result Value Ref Range Status   Specimen Description BLOOD RIGHT ANTECUBITAL  Final   Special Requests   Final    BOTTLES DRAWN AEROBIC AND ANAEROBIC Blood Culture adequate volume   Culture   Final    NO GROWTH 3 DAYS Performed at [facility], [address], [place], [place] [address]    Report Status PENDING  Incomplete  Culture, blood (routine x 2)     Status: None (Preliminary result)   Collection Time: 06/05/24  2:55 PM   Specimen: BLOOD  Result Value Ref Range Status   Specimen Description BLOOD RIGHT ANTECUBITAL  Final   Special Requests   Final    BOTTLES DRAWN AEROBIC ONLY Blood Culture results may not be optimal due to  an inadequate volume of blood received in culture bottles   Culture   Final    NO GROWTH 3 DAYS Performed at [facility], [address], [place], [place] [address]    Report Status PENDING  Incomplete  Urine Culture     Status: Abnormal   Collection Time: 06/05/24  5:08 PM   Specimen: Urine, Catheterized  Result Value Ref Range Status   Specimen Description   Final    URINE, CATHETERIZED Performed at [facility], [address] [place], [place] [address]    Special Requests   Final     NONE Performed at [facility], [address], [place], [place] [address]    Culture (A)  Final    >=100,000 COLONIES/mL KLEBSIELLA PNEUMONIAE Confirmed Extended Spectrum Beta-Lactamase Producer (ESBL).  In bloodstream infections from ESBL organisms, carbapenems are preferred over piperacillin /tazobactam. They are shown to have a lower risk of mortality.    Report Status 06/07/2024 FINAL  Final   Organism ID, Bacteria KLEBSIELLA PNEUMONIAE (A)  Final      Susceptibility   Klebsiella pneumoniae - MIC*    AMPICILLIN >=32 RESISTANT Resistant     CEFAZOLIN  (URINE) Value in next row Resistant      >=32 RESISTANTThis is a modified FDA-approved test that has been validated and its performance characteristics determined by the reporting laboratory.  This laboratory is certified under the Clinical Laboratory Improvement Amendments CLIA as qualified to perform high complexity clinical laboratory testing.    CEFEPIME  Value in next row Resistant      >=32 RESISTANTThis is a modified FDA-approved test that has been validated and its performance characteristics determined by the reporting laboratory.  This laboratory is certified under the Clinical Laboratory Improvement Amendments CLIA as qualified to perform high complexity clinical laboratory testing.    ERTAPENEM  Value in next row Sensitive      >=32 RESISTANTThis is a modified FDA-approved test that has been validated and its performance characteristics determined by the reporting laboratory.  This laboratory is certified under the Clinical Laboratory Improvement Amendments CLIA as qualified to perform high complexity clinical laboratory testing.    CEFTRIAXONE  Value in next row Resistant      >=32 RESISTANTThis is a modified FDA-approved test that has been validated and its performance characteristics determined by the reporting laboratory.  This laboratory is certified under the Clinical Laboratory Improvement Amendments CLIA as qualified to perform high  complexity clinical laboratory testing.    CIPROFLOXACIN Value in next row Resistant      >=32 RESISTANTThis is a modified FDA-approved test that has been validated and its performance characteristics determined by the reporting laboratory.  This laboratory is certified under the Clinical Laboratory Improvement Amendments CLIA as qualified to perform high complexity clinical laboratory testing.    GENTAMICIN Value in next row Resistant      >=32 RESISTANTThis is a modified FDA-approved test that has been validated and its performance characteristics determined by the reporting laboratory.  This laboratory is certified under the Clinical Laboratory Improvement Amendments CLIA as qualified to perform high complexity clinical laboratory testing.    NITROFURANTOIN  Value in next row Resistant      >=32 RESISTANTThis is a modified FDA-approved test that has been validated and its performance characteristics determined by the reporting laboratory.  This laboratory is certified under the Clinical Laboratory Improvement Amendments CLIA as qualified to perform high complexity clinical laboratory testing.    TRIMETH/SULFA Value in next row Resistant      >=32 RESISTANTThis  is a modified FDA-approved test that has been validated and its performance characteristics determined by the reporting laboratory.  This laboratory is certified under the Clinical Laboratory Improvement Amendments CLIA as qualified to perform high complexity clinical laboratory testing.    AMPICILLIN/SULBACTAM Value in next row Resistant      >=32 RESISTANTThis is a modified FDA-approved test that has been validated and its performance characteristics determined by the reporting laboratory.  This laboratory is certified under the Clinical Laboratory Improvement Amendments CLIA as qualified to perform high complexity clinical laboratory testing.    PIP/TAZO Value in next row Sensitive      16 SENSITIVEThis is a modified FDA-approved test that has  been validated and its performance characteristics determined by the reporting laboratory.  This laboratory is certified under the Clinical Laboratory Improvement Amendments CLIA as qualified to perform high complexity clinical laboratory testing.    MEROPENEM  Value in next row Sensitive      16 SENSITIVEThis is a modified FDA-approved test that has been validated and its performance characteristics determined by the reporting laboratory.  This laboratory is certified under the Clinical Laboratory Improvement Amendments CLIA as qualified to perform high complexity clinical laboratory testing.    * >=100,000 COLONIES/mL KLEBSIELLA PNEUMONIAE     Time coordinating discharge: 35 minutes  SIGNED:   Derryl Duval, MD  [facility] 06/08/2024, 2:41 PM

## 2024-06-08 NOTE — Care Management Important Message (Signed)
 Important Message  Patient Details  Name: Amy Livingston MRN: [id number] Date of Birth: [birth year]   Important Message Given:  Yes - Medicare IM     Amy Livingston Amy Livingston 06/08/2024, 11:39 AM

## 2024-06-08 NOTE — TOC CM/SW Note (Signed)
 Transition of Care [facility]) CM/SW Note    06/08/2024  India Jolin DOB: 1956-06-22 MRN: [id number]   RIDER WAIVER AND RELEASE OF LIABILITY  For the purposes of helping with transportation needs, Christine partners with outside transportation providers (taxi companies, [place], catering manager.) to give [place] patients or other approved people the choice of on-demand rides Public Librarian) to our buildings for non-emergency visits.  By using [employer], I, the person signing this document, on behalf of myself and/or any legal minors (in my care using the [employer]), agree:  Science Writer given to me are supplied by independent, outside transportation providers who do not work for, or have any affiliation with, [employer]. [place] is not a transportation company. [place] has no control over the quality or safety of the rides I get using [employer]. [place] has no control over whether any outside ride will happen on time or not. [place] gives no guarantee on the reliability, quality, safety, or availability on any rides, or that no mistakes will happen. I know and accept that traveling by vehicle (car, truck, SVU, fleeta, bus, taxi, etc.) has risks of serious injuries such as disability, being paralyzed, and death. I know and agree the risk of using [employer] is mine alone, and not [employer]. [employer] are provided as is and as are available. The transportation providers are in charge for all inspections and care of the vehicles used to provide these rides. I agree not to take legal action against [place], its agents, employees, officers, directors, representatives, insurers, attorneys, assigns, successors, subsidiaries, and affiliates at any time for any reasons related directly or indirectly to using [employer]. I also agree not to take legal action against [place] or its affiliates for any injury, death, or damage to  property caused by or related to using [employer]. I have read this Waiver and Release of Liability, and I understand the terms used in it and their legal meaning. This Waiver is freely and voluntarily given with the understanding that my right (or any legal minors) to legal action against [place] relating to [employer] is knowingly given up to use these services.   I attest that I read the Ride Waiver and Release of Liability to Toriann Abdalla, gave Ms. Guedes the opportunity to ask questions and answered the questions asked (if any). I affirm that Zayneb Arvidson then provided consent for assistance with transportation.

## 2024-06-08 NOTE — TOC Transition Note (Addendum)
 Transition of Care [facility]) - Discharge Note   Patient Details  Name: Amy Livingston MRN: [id number] Date of Birth: April 14, 1956  Transition of Care [facility]) CM/SW Contact:  Noreen KATHEE Cleotilde ISRAEL Phone Number: 06/08/2024, 2:48 PM   Clinical Narrative:     Patient is DC to [facility] rehab today. Spoke with son and updated him. Donald provided room and report number, which was given to nurse. CSW spoke with patient at bedside to get transportation wavier for [employer] signed. CSW reviewed form and placed it in file to be scanned in. Ola has been called. Eta for [facility] is 4:00PM . Facility made aware.  Final next level of care: Skilled Nursing Facility Barriers to Discharge: Barriers Resolved   Patient Goals and CMS Choice Patient states their goals for this hospitalization and ongoing recovery are:: get rehab CMS Medicare.gov Compare Post Acute Care list provided to:: Patient Choice offered to / list presented to : Patient [place] ownership interest in [facility].provided to:: Patient    Discharge Placement              Patient chooses bed at: [facility] Patient to be transferred to facility by: [employer] van Name of family member notified: Zaniyah and Son Patient and family notified of of transfer: 06/08/24  Discharge Plan and Services Additional resources added to the After Visit Summary for   In-house Referral: Clinical Social Work Discharge Planning Services: NA Post Acute Care Choice: Skilled Nursing Facility          DME Arranged: N/A DME Agency: NA                  Social Drivers of Health (SDOH) Interventions SDOH Screenings   Food Insecurity: Food Insecurity Present (06/05/2024)  Housing: Low Risk  (06/05/2024)  Transportation Needs: No Transportation Needs (06/05/2024)  Utilities: Not At Risk (06/05/2024)  Alcohol Screen: Low Risk  (03/21/2024)  Depression (PHQ2-9): Low Risk  (03/22/2024)  Financial Resource Strain: Medium  Risk (03/21/2024)  Physical Activity: Inactive (03/21/2024)  Social Connections: Socially Isolated (06/05/2024)  Stress: Stress Concern Present (03/21/2024)  Tobacco Use: Medium Risk (06/05/2024)  Health Literacy: Adequate Health Literacy (09/28/2023)     Readmission Risk Interventions    06/08/2024    2:47 PM 06/08/2024    1:33 PM 06/06/2024   12:55 PM  Readmission Risk Prevention Plan  Transportation Screening Complete Complete Complete  PCP or Specialist Appt within 5-7 Days   Complete  Home Care Screening   Complete  Medication Review (RN CM)   Complete  HRI or Home Care Consult Complete Complete   Social Work Consult for Recovery Care Planning/Counseling Complete Complete   Palliative Care Screening Not Applicable Not Applicable   Medication Review Oceanographer) Complete Complete

## 2024-06-08 NOTE — Progress Notes (Signed)
 PHARMACY CONSULT NOTE FOR:  OUTPATIENT  PARENTERAL ANTIBIOTIC THERAPY (OPAT)  Indication: ESBL Klebsiella UTI  Regimen: Ertapenem  1 gm every 24 hours End date: 06/13/24  IV antibiotic discharge orders are pended. To discharging provider:  please sign these orders via discharge navigator,  Select New Orders & click on the button choice - Manage This Unsigned Work.     Thank you for allowing pharmacy to be a part of this patient's care.  Damien Quiet, PharmD, BCPS, BCIDP Infectious Diseases Clinical Pharmacist Phone: [phone] 06/08/2024, 12:20 PM

## 2024-06-10 LAB — CULTURE, BLOOD (ROUTINE X 2)
Culture: NO GROWTH
Culture: NO GROWTH
Special Requests: ADEQUATE

## 2024-06-11 DIAGNOSIS — I10 Essential (primary) hypertension: Secondary | ICD-10-CM

## 2024-06-11 DIAGNOSIS — B961 Klebsiella pneumoniae [K. pneumoniae] as the cause of diseases classified elsewhere: Secondary | ICD-10-CM

## 2024-06-11 DIAGNOSIS — E1140 Type 2 diabetes mellitus with diabetic neuropathy, unspecified: Secondary | ICD-10-CM | POA: Diagnosis not present

## 2024-06-11 DIAGNOSIS — F411 Generalized anxiety disorder: Secondary | ICD-10-CM

## 2024-06-11 DIAGNOSIS — K219 Gastro-esophageal reflux disease without esophagitis: Secondary | ICD-10-CM

## 2024-06-11 DIAGNOSIS — R339 Retention of urine, unspecified: Secondary | ICD-10-CM

## 2024-06-11 DIAGNOSIS — N390 Urinary tract infection, site not specified: Secondary | ICD-10-CM | POA: Diagnosis not present

## 2024-06-11 DIAGNOSIS — I480 Paroxysmal atrial fibrillation: Secondary | ICD-10-CM | POA: Diagnosis not present

## 2024-06-11 DIAGNOSIS — E1165 Type 2 diabetes mellitus with hyperglycemia: Secondary | ICD-10-CM | POA: Diagnosis not present

## 2024-06-11 DIAGNOSIS — F32A Depression, unspecified: Secondary | ICD-10-CM

## 2024-07-23 ENCOUNTER — Telehealth: Admitting: Family Medicine

## 2024-07-23 DIAGNOSIS — E782 Mixed hyperlipidemia: Secondary | ICD-10-CM | POA: Diagnosis not present

## 2024-07-23 DIAGNOSIS — E038 Other specified hypothyroidism: Secondary | ICD-10-CM

## 2024-07-23 DIAGNOSIS — E559 Vitamin D deficiency, unspecified: Secondary | ICD-10-CM | POA: Diagnosis not present

## 2024-07-23 DIAGNOSIS — R7301 Impaired fasting glucose: Secondary | ICD-10-CM

## 2024-07-23 NOTE — Progress Notes (Signed)
 Virtual Visit via Video Note  I connected with Amy Livingston on 07/23/2024 at  2:40 PM EST by a video enabled telemedicine application and verified that I am speaking with the correct person using two identifiers.  Patient Location: Home Provider Location: Home Office  I discussed the limitations, risks, security, and privacy concerns of performing an evaluation and management service by video and the availability of in person appointments. I also discussed with the patient that there may be a patient responsible charge related to this service. The patient expressed understanding and agreed to proceed.  Subjective: PCP: Edman Meade PEDLAR, FNP  Chief Complaint  Patient presents with   Medical Management of Chronic Issues    Follow up   HPI The patient is currently in [facility] and has been there for one month. He has fallen twice during his stay. He reports that he stopped taking Mounjaro  last week due to severe GERD symptoms  ROS: Per HPI Current Medications[1]  Observations/Objective: There were no vitals filed for this visit. Physical Exam  No labored breathing.  Speech is clear and coherent with logical content.  Patient is alert and oriented at baseline.   Assessment and Plan: IFG (impaired fasting glucose) -     Hemoglobin A1c  Vitamin D  deficiency -     VITAMIN D  25 Hydroxy (Vit-D Deficiency, Fractures)  TSH (thyroid -stimulating hormone deficiency) -     TSH + free T4  Mixed hyperlipidemia -     Lipid panel -     CMP14+EGFR -     CBC with Differential/Platelet    Continue all treatment regimen as is  Follow Up Instructions: No follow-ups on file.   I discussed the assessment and treatment plan with the patient. The patient was provided an opportunity to ask questions, and all were answered. The patient agreed with the plan and demonstrated an understanding of the instructions.   The patient was advised to call back or seek an in-person evaluation if the  symptoms worsen or if the condition fails to improve as anticipated.  The above assessment and management plan was discussed with the patient. The patient verbalized understanding of and has agreed to the management plan.   Amy Livingston  Z Bacchus, FNP     [1]  Current Outpatient Medications:    Ascorbic Acid (VITAMIN C ) 100 MG tablet, Take 1 tablet (100 mg total) by mouth daily., Disp: 30 tablet, Rfl: 2   atorvastatin  (LIPITOR) 80 MG tablet, TAKE 1 TABLET(80 MG) BY MOUTH DAILY (Patient taking differently: Take 80 mg by mouth daily.), Disp: 90 tablet, Rfl: 3   cyclobenzaprine  (FLEXERIL ) 10 MG tablet, Take 10 mg by mouth 3 (three) times daily as needed for muscle spasms., Disp: , Rfl:    ELIQUIS  5 MG TABS tablet, Take 5 mg by mouth 2 (two) times daily., Disp: , Rfl:    ertapenem  (INVANZ ) IVPB, Inject 1 g into the vein daily. Indication: ESBL Klebsiella UTI First Dose: Yes Last Day of Therapy: 06/13/2024 Labs - Once weekly:  CBC/D and BMP,, Disp: 5 Units, Rfl: 0   estradiol  (ESTRACE ) 0.1 MG/GM vaginal cream, Place 0.5 g vaginally 2 (two) times a week. Place 0.5g nightly for two weeks then twice a week after, Disp: 30 g, Rfl: 3   famotidine  (PEPCID ) 20 MG tablet, Take 1 tablet (20 mg total) by mouth daily., Disp: 30 tablet, Rfl: 0   gabapentin  (NEURONTIN ) 300 MG capsule, Take 1 capsule (300 mg total) by mouth 3 (three) times  daily., Disp: 120 capsule, Rfl: 1   glucose blood (ACCU-CHEK GUIDE TEST) test strip, USE TO TEST BLOOD SUGAR EVERY MORNING, AT NOON, AND EVERY NIGHT AT BEDTIME., Disp: 100 strip, Rfl: 0   guaiFENesin  (ROBITUSSIN) 100 MG/5ML liquid, Take 5 mLs by mouth every 4 (four) hours as needed for cough or to loosen phlegm., Disp: 120 mL, Rfl: 0   insulin  isophane & regular human KwikPen (NOVOLIN  70/30 KWIKPEN) (70-30) 100 UNIT/ML KwikPen, Inject 15 Units into the skin 2 (two) times daily with a meal., Disp: 15 mL, Rfl: 5   meclizine  (ANTIVERT ) 25 MG tablet, Take 1 tablet (25 mg total) by mouth  daily as needed for dizziness., Disp: 30 tablet, Rfl: 5   mometasone  (ELOCON ) 0.1 % cream, Apply topically daily as needed for itch (Patient taking differently: Apply 1 Application topically daily as needed (itching). Apply topically daily as needed for itch), Disp: 15 g, Rfl: 3   Multiple Vitamin (MULTIVITAMIN WITH MINERALS) TABS tablet, Take 1 tablet by mouth daily., Disp: , Rfl:    ondansetron  (ZOFRAN ) 4 MG tablet, Take 1 tablet (4 mg total) by mouth every 6 (six) hours as needed for nausea. (Patient taking differently: Take 4 mg by mouth 2 (two) times daily.), Disp: 20 tablet, Rfl: 0   oxyCODONE -acetaminophen  (PERCOCET) 10-325 MG tablet, Take 1 tablet by mouth every 8 (eight) hours as needed for pain., Disp: , Rfl:    oxyCODONE -acetaminophen  (PERCOCET) 10-325 MG tablet, Take 1 tablet by mouth every 6 (six) hours as needed for pain., Disp: 20 tablet, Rfl: 0   sertraline  (ZOLOFT ) 50 MG tablet, TAKE 1 TABLET(50 MG) BY MOUTH DAILY, Disp: 90 tablet, Rfl: 0   sucralfate  (CARAFATE ) 1 g tablet, Take 1 tablet (1 g total) by mouth 4 (four) times daily -  with meals and at bedtime., Disp: 45 tablet, Rfl: 0   tirzepatide  (MOUNJARO ) 15 MG/0.5ML Pen, Inject 15 mg into the skin once a week. (Patient taking differently: Inject 15 mg into the skin every Monday.), Disp: 6 mL, Rfl: 1   traZODone  (DESYREL ) 100 MG tablet, Take 1 tablet (100 mg total) by mouth at bedtime., Disp: 60 tablet, Rfl: 3   vancomycin  (VANCOCIN ) 125 MG capsule, Take 1 capsule (125 mg total) by mouth daily., Disp: 5 capsule, Rfl: 0

## 2024-07-30 LAB — LAB REPORT - SCANNED: EGFR: 62

## 2024-08-07 ENCOUNTER — Other Ambulatory Visit: Payer: Self-pay | Admitting: Family Medicine

## 2024-08-07 DIAGNOSIS — E1140 Type 2 diabetes mellitus with diabetic neuropathy, unspecified: Secondary | ICD-10-CM

## 2024-08-13 ENCOUNTER — Other Ambulatory Visit: Payer: Self-pay | Admitting: Family Medicine

## 2024-08-24 ENCOUNTER — Ambulatory Visit: Admitting: Urology

## 2024-08-27 ENCOUNTER — Telehealth: Payer: Self-pay | Admitting: Family Medicine

## 2024-08-27 NOTE — Telephone Encounter (Signed)
 Copied from CRM #[id number]. Topic: General - Other >> Aug 27, 2024  2:37 PM Wess RAMAN wrote: Reason for CRM: [place] from [facility] would like the progress notes from Dec. 2025 for patient  Callback #: [phone] Fax #: [phone]

## 2024-09-03 ENCOUNTER — Telehealth: Payer: Self-pay

## 2024-09-03 ENCOUNTER — Inpatient Hospital Stay: Admitting: Oncology

## 2024-09-03 ENCOUNTER — Inpatient Hospital Stay

## 2024-09-03 DIAGNOSIS — D649 Anemia, unspecified: Secondary | ICD-10-CM | POA: Insufficient documentation

## 2024-09-03 DIAGNOSIS — R296 Repeated falls: Secondary | ICD-10-CM | POA: Insufficient documentation

## 2024-09-03 NOTE — Assessment & Plan Note (Addendum)
--   Will order full nutritional studies to include iron panel, ferritin, vitamin B12, folate, and methylmalonic acid. -- Will order hemolysis labs with LDH and reticulocytes.  If evidence of hemolysis we will also order haptoglobin and DAT -- Rule out multiple myeloma with SPEP and serum free light chains --She is on Eliquis  for atrial fibrillation and has frequent falls. --Had a recent colonoscopy and upper endoscopy on 07/25/2023 which showed a few polyps, diverticulosis and hemorrhoids.  EGD showed gastritis and white lesions that were sent for pathology.  Pathology was negative for high-grade dysplasia. -- If no clear etiology can be found we will need to pursue a bone marrow biopsy. -- Return to clinic pending the results of the above studies.

## 2024-09-03 NOTE — Progress Notes (Unsigned)
 "  [facility] OFFICE PROGRESS NOTE  Bacchus, Meade PEDLAR, FNP  ASSESSMENT & PLAN:  I connected with Barnie Lunch on 09/03/24 at 10:30 AM EST by telephone visit and verified that I am speaking with the correct person using two identifiers.   I discussed the limitations, risks, security and privacy concerns of performing an evaluation and management service by telemedicine and the availability of in-person appointments. I also discussed with the patient that there may be a patient responsible charge related to this service. The patient expressed understanding and agreed to proceed.   Other persons participating in the visit and their role in the encounter: NP, Patient    Patients location: Home   Providers location: Home   Assessment & Plan Normocytic anemia -- Will order full nutritional studies to include iron panel, ferritin, vitamin B12, folate, and methylmalonic acid. -- Will order hemolysis labs with LDH and reticulocytes.  If evidence of hemolysis we will also order haptoglobin and DAT -- Rule out multiple myeloma with SPEP and serum free light chains --She is on Eliquis  for atrial fibrillation and has frequent falls. --Had a recent colonoscopy and upper endoscopy on 07/25/2023 which showed a few polyps, diverticulosis and hemorrhoids.  EGD showed gastritis and white lesions that were sent for pathology.  Pathology was negative for high-grade dysplasia. -- If no clear etiology can be found we will need to pursue a bone marrow biopsy. -- Return to clinic pending the results of the above studies.  Frequent falls Currently resides at [facility]. Had many falls over the past 3 to 4 months.   Orders Placed This Encounter  Procedures   Ferritin    Standing Status:   Future    Expected Date:   09/06/2024    Expiration Date:   12/05/2024   CBC with Differential/Platelet    Standing Status:   Future    Expected Date:   09/06/2024    Expiration Date:   12/05/2024    Copper, serum    Standing Status:   Future    Expected Date:   09/06/2024    Expiration Date:   12/05/2024   Vitamin B12    Standing Status:   Future    Expected Date:   09/06/2024    Expiration Date:   12/05/2024   Methylmalonic acid, serum    Standing Status:   Future    Expected Date:   09/06/2024    Expiration Date:   12/05/2024   Iron and TIBC    Standing Status:   Future    Expected Date:   09/06/2024    Expiration Date:   12/05/2024   Folate    Standing Status:   Future    Expected Date:   09/06/2024    Expiration Date:   12/05/2024   Multiple Myeloma Panel (SPEP&IFE w/QIG)    Standing Status:   Future    Expected Date:   09/06/2024    Expiration Date:   12/05/2024   Kappa/lambda light chains    Standing Status:   Future    Expected Date:   09/06/2024    Expiration Date:   12/05/2024   Reticulocytes    Standing Status:   Future    Expected Date:   09/06/2024    Expiration Date:   12/05/2024   Comprehensive metabolic panel with GFR    Standing Status:   Future    Expected Date:   09/06/2024    Expiration Date:   12/05/2024  Lactate dehydrogenase    Standing Status:   Future    Expected Date:   09/06/2024    Expiration Date:   12/05/2024    INTERVAL HISTORY: Patient is a 69 year old female who was recently referred to hematology for chronic anemia.  On chart review, it appears her hemoglobin has been low since March 2025.  Her last normal hemoglobin was on 11/05/2023.  Since August her hemoglobin has been between 7 and 9.  Patient has had multiple falls and is on Eliquis .  Denies any obvious bright red blood per rectum, melena or hematochezia.  She has past medical history significant for anxiety, depression, type 2 diabetes, atrial fibrillation, dizziness, GERD, hyperlipidemia, hypertension, UTI, vertigo, B12 deficiency, vitamin D  deficiency and frequent yeast infections.  She has past surgical history that includes hysterectomy (2006), lumbar fusion (2006) and cervical spine  surgery.  She had her gallbladder removed and back surgery as well.  She is a former smoker and quit smoking about 17 years ago.  Patient was seen at [facility] ED for a fall injuring her lower back in October 2025. She was evaluated at [facility] several times in early October for recurrent Klebsiella UTI and falls due to generalized weakness.  She was discharged to SN[facility] rehab).  She required 7 days of IV Invanz .  She was also found to have hydronephrosis bilaterally and is followed by urology.  Had Foley catheter placed.  She is on Eliquis  for atrial fibrillation.  She has uncontrolled diabetes with A1c 8.9.  Previously on Mounjaro  which was discontinued secondary to GERD symptoms.  SUMMARY OF HEMATOLOGIC HISTORY: Oncology History   No problem history exists.     CBC    Component Value Date/Time   WBC 7.6 06/07/2024 1003   RBC 2.82 (L) 06/07/2024 1003   HGB 8.8 (L) 06/08/2024 0927   HGB 13.7 06/01/2023 1612   HCT 29.2 (L) 06/08/2024 0927   HCT 43.6 06/01/2023 1612   PLT 314 06/07/2024 1003   PLT 193 06/01/2023 1612   MCV 82.3 06/07/2024 1003   MCV 97 06/01/2023 1612   MCH 24.8 (L) 06/07/2024 1003   MCHC 30.2 06/07/2024 1003   RDW 19.6 (H) 06/07/2024 1003   RDW 11.9 06/01/2023 1612   LYMPHSABS 1.9 06/07/2024 1003   LYMPHSABS 2.9 06/01/2023 1612   MONOABS 0.7 06/07/2024 1003   EOSABS 0.3 06/07/2024 1003   EOSABS 0.3 06/01/2023 1612   BASOSABS 0.0 06/07/2024 1003   BASOSABS 0.1 06/01/2023 1612       Latest Ref Rng & Units 06/07/2024   10:03 AM 06/06/2024    4:56 AM 06/05/2024    2:55 PM  CMP  Glucose 70 - 99 mg/dL 704  716  543   BUN 8 - 23 mg/dL 12  11  13    Creatinine 0.44 - 1.00 mg/dL 8.94  8.89  8.78   Sodium 135 - 145 mmol/L 134  133  130   Potassium 3.5 - 5.1 mmol/L 4.2  4.3  4.3   Chloride 98 - 111 mmol/L 100  98  95   CO2 22 - 32 mmol/L 25  28  26    Calcium  8.9 - 10.3 mg/dL 8.7  9.0  9.4   Total Protein 6.5 - 8.1 g/dL   6.5   Total Bilirubin 0.0 - 1.2 mg/dL    0.3   Alkaline Phos 38 - 126 U/L   151   AST 15 - 41 U/L   <10   ALT 0 -  44 U/L   <5      No results found for: FERRITIN, VITAMINB12  There were no vitals filed for this visit.  Review of System:  Review of Systems  Constitutional:  Positive for malaise/fatigue.  Musculoskeletal:  Positive for falls.    Physical Exam: Physical Exam Neurological:     Mental Status: She is alert and oriented to person, place, and time.      I spent 40 minutes dedicated to the care of this patient (face-to-face and non-face-to-face) on the date of the encounter to include what is described in the assessment and plan.,  Delon Hope, NP 09/03/2024 9:11 AM "

## 2024-09-03 NOTE — Assessment & Plan Note (Addendum)
 Currently resides at [facility]. Had many falls over the past 3 to 4 months.

## 2024-09-03 NOTE — Transitions of Care (Post Inpatient/ED Visit) (Signed)
 "  09/03/2024  Name: Amy Livingston MRN: [id number] DOB: Jul 28, 1956  Today's TOC FU Call Status: Today's TOC FU Call Status:: Successful TOC FU Call Completed TOC FU Call Complete Date: 09/03/24  Patient's Name and Date of Birth confirmed. DOB, Name  Transition Care Management Follow-up Telephone Call Date of Discharge: 09/01/24 Discharge Facility: Other ([facility]) Name of Other ([facility]) Discharge Facility: [facility] Type of Discharge: Inpatient Admission Primary Inpatient Discharge Diagnosis:: Falls; Contusion to back How have you been since you were released from the hospital?: Better Any questions or concerns?: Yes Patient Questions/Concerns:: (S) Would like to discuss restarting Mounjaro  at a lower dose Patient Questions/Concerns Addressed: Notified Provider of Patient Questions/Concerns  Items Reviewed: Did you receive and understand the discharge instructions provided?: Yes Medications obtained,verified, and reconciled?: Yes (Medications Reviewed) Any new allergies since your discharge?: No Dietary orders reviewed?: NA Do you have support at home?: No  Medications Reviewed Today: Medications Reviewed Today     Reviewed by Lavelle Charmaine NOVAK, LPN (Licensed Practical Nurse) on 09/03/24 at 1226  Med List Status: <None>   Medication Order Taking? Sig Documenting Provider Last Dose Status Informant  Ascorbic Acid (VITAMIN C ) 100 MG tablet [id number]  Take 1 tablet (100 mg total) by mouth daily. Guadlupe Lianne DASEN, MD  Active Self, Pharmacy Records  atorvastatin  (LIPITOR) 80 MG tablet [id number]  TAKE 1 TABLET(80 MG) BY MOUTH DAILY  Patient taking differently: Take 80 mg by mouth daily.   Edman Meade PEDLAR, FNP  Active Self, Pharmacy Records  cyclobenzaprine  (FLEXERIL ) 10 MG tablet [id number]  Take 10 mg by mouth 3 (three) times daily as needed for muscle spasms. [provider]  Active Self, Pharmacy Records  ELIQUIS  5 MG TABS tablet [id number]  Take 5 mg by mouth 2  (two) times daily. [provider]  Active Self, Pharmacy Records           Med Note (WARD, ANGELICA G   Thu Jun 07, 2024  3:52 PM) 1030  ertapenem  (INVANZ ) IVPB [id number]  Inject 1 g into the vein daily. Indication: ESBL Klebsiella UTI First Dose: Yes Last Day of Therapy: 06/13/2024 Labs - Once weekly:  CBC/D and BMP, Sigdel, Santosh, MD  Active   estradiol  (ESTRACE ) 0.1 MG/GM vaginal cream [id number]  Place 0.5 g vaginally 2 (two) times a week. Place 0.5g nightly for two weeks then twice a week after Guadlupe Lianne DASEN, MD  Active Self, Pharmacy Records           Med Note [place], CHUCK KANDICE Schaumann May 31, 2024  4:51 PM) Has not started yet d/t having trouble reading instructions  famotidine  (PEPCID ) 20 MG tablet [id number]  Take 1 tablet (20 mg total) by mouth daily. Edman Meade PEDLAR, FNP  Active Self, Pharmacy Records  gabapentin  (NEURONTIN ) 300 MG capsule [id number]  TAKE 1 CAPSULE(300 MG) BY MOUTH THREE TIMES DAILY Bacchus, Gloria Z, FNP  Active   glucose blood (ACCU-CHEK GUIDE TEST) test strip [id number]  USE TO TEST BLOOD SUGAR EVERY MORNING, AT NOON, AND EVERY NIGHT AT BEDTIME. Edman Meade PEDLAR, FNP  Active Self, Pharmacy Records  guaiFENesin  [facility]) 100 MG/5ML liquid [id number]  Take 5 mLs by mouth every 4 (four) hours as needed for cough or to loosen phlegm. Sonjia Held, MD  Active Self, Pharmacy Records           Med Note [place], CHUCK KANDICE Schaumann May 31, 2024  4:52 PM) Has not used yet at home  insulin   isophane & regular human KwikPen (NOVOLIN  70/30 KWIKPEN) (70-30) 100 UNIT/ML KwikPen [id number]  Inject 15 Units into the skin 2 (two) times daily with a meal. Sigdel, Santosh, MD  Active   meclizine  (ANTIVERT ) 25 MG tablet [id number]  Take 1 tablet (25 mg total) by mouth daily as needed for dizziness. Edman Meade PEDLAR, FNP  Active Self, Pharmacy Records  mometasone  (ELOCON ) 0.1 % cream [id number]  Apply topically daily as needed for itch  Patient taking differently: Apply 1 Application  topically daily as needed (itching). Apply topically daily as needed for itch   Karis Clunes, MD  Active Self, Pharmacy Records           Med Note Brooks, CHUCK KANDICE Schaumann May 31, 2024  4:51 PM) Has not started yet d/t having trouble reading instructions  Multiple Vitamin (MULTIVITAMIN WITH MINERALS) TABS tablet [id number]  Take 1 tablet by mouth daily. Pokhrel, Vernal, MD  Active Self, Pharmacy Records  ondansetron  (ZOFRAN ) 4 MG tablet [id number]  Take 1 tablet (4 mg total) by mouth every 6 (six) hours as needed for nausea.  Patient taking differently: Take 4 mg by mouth 2 (two) times daily.   Pearlean Manus, MD  Active Self, Pharmacy Records  oxyCODONE -acetaminophen  (PERCOCET) 10-325 MG tablet [id number]  Take 1 tablet by mouth every 8 (eight) hours as needed for pain. [provider]  Active Self, Pharmacy Records  oxyCODONE -acetaminophen  (PERCOCET) 10-325 MG tablet [id number]  Take 1 tablet by mouth every 6 (six) hours as needed for pain. Sigdel, Santosh, MD  Active   sertraline  (ZOLOFT ) 50 MG tablet [id number]  TAKE 1 TABLET(50 MG) BY MOUTH DAILY Bacchus, Gloria Z, FNP  Active Self, Pharmacy Records  sucralfate  (CARAFATE ) 1 g tablet [id number]  Take 1 tablet (1 g total) by mouth 4 (four) times daily -  with meals and at bedtime. Bacchus, Meade PEDLAR, FNP  Active Self, Pharmacy Records  tirzepatide  (MOUNJARO ) 15 MG/0.5ML Pen [id number]  Inject 15 mg into the skin once a week.  Patient not taking: Reported on 09/03/2024   Edman Meade PEDLAR, FNP  Active Self, Pharmacy Records  traZODone  (DESYREL ) 100 MG tablet [id number]  Take 1 tablet (100 mg total) by mouth at bedtime. Edman Meade PEDLAR, FNP  Active Self, Pharmacy Records  vancomycin  (VANCOCIN ) 125 MG capsule [id number]  Take 1 capsule (125 mg total) by mouth daily. Mcarthur Pick, MD  Active             Home Care and Equipment/Supplies: Any new equipment or medical supplies ordered?: NA  Functional Questionnaire: Do you need assistance  with bathing/showering or dressing?: No Do you need assistance with meal preparation?: No Do you need assistance with eating?: No Do you have difficulty maintaining continence: No Do you need assistance with getting out of bed/getting out of a chair/moving?: No Do you have difficulty managing or taking your medications?: No  Follow up appointments reviewed: PCP Follow-up appointment confirmed?: Yes Date of PCP follow-up appointment?: 09/06/24 Follow-up Provider: [facility] Follow-up appointment confirmed?: NA Do you need transportation to your follow-up appointment?: No Do you understand care options if your condition(s) worsen?: Yes-patient verbalized understanding    SIGNATURE Charmaine Bloodgood, LPN [facility] Advisor [facility] You Are. We Are. [facility] Direct Dial [phone]  "

## 2024-09-05 ENCOUNTER — Encounter: Payer: Self-pay | Admitting: Gastroenterology

## 2024-09-06 ENCOUNTER — Telehealth: Payer: Self-pay | Admitting: Family Medicine

## 2024-09-06 ENCOUNTER — Inpatient Hospital Stay: Attending: Oncology | Admitting: Oncology

## 2024-09-06 DIAGNOSIS — D649 Anemia, unspecified: Secondary | ICD-10-CM | POA: Diagnosis not present

## 2024-09-06 DIAGNOSIS — S20229D Contusion of unspecified back wall of thorax, subsequent encounter: Secondary | ICD-10-CM

## 2024-09-06 DIAGNOSIS — R296 Repeated falls: Secondary | ICD-10-CM

## 2024-09-06 DIAGNOSIS — B49 Unspecified mycosis: Secondary | ICD-10-CM

## 2024-09-06 DIAGNOSIS — N390 Urinary tract infection, site not specified: Secondary | ICD-10-CM

## 2024-09-06 DIAGNOSIS — E559 Vitamin D deficiency, unspecified: Secondary | ICD-10-CM

## 2024-09-06 DIAGNOSIS — E1165 Type 2 diabetes mellitus with hyperglycemia: Secondary | ICD-10-CM

## 2024-09-06 MED ORDER — NYSTATIN-TRIAMCINOLONE 100000-0.1 UNIT/GM-% EX OINT: 1.0000 | TOPICAL_OINTMENT | CUTANEOUS | Status: AC | PRN

## 2024-09-06 MED ORDER — OXYCODONE-ACETAMINOPHEN 10-325 MG PO TABS: 1.0000 | ORAL_TABLET | Freq: Three times a day (TID) | ORAL | 0 refills | Status: AC | PRN

## 2024-09-06 NOTE — Assessment & Plan Note (Addendum)
--   Will order full nutritional studies to include iron panel, ferritin, vitamin B12, folate, and methylmalonic acid. -- Will order hemolysis labs with LDH and reticulocytes.  If evidence of hemolysis we will also order haptoglobin and DAT -- Rule out multiple myeloma with SPEP and serum free light chains --She is on Eliquis  for atrial fibrillation and has frequent falls. --Had a recent colonoscopy and upper endoscopy on 07/25/2023 which showed a few polyps, diverticulosis and hemorrhoids.  EGD showed gastritis and white lesions that were sent for pathology.  Pathology was negative for high-grade dysplasia. -- If no clear etiology can be found we will need to pursue a bone marrow biopsy. -- Return to clinic pending the results of the above studies.

## 2024-09-06 NOTE — Assessment & Plan Note (Addendum)
 Reports she keeps a urinary tract infection and she is currently on ciprofloxacin twice daily and has 2 additional days. She is followed closely by urology.

## 2024-09-06 NOTE — Progress Notes (Signed)
 "  [facility] OFFICE PROGRESS NOTE  Bacchus, Meade PEDLAR, FNP  ASSESSMENT & PLAN:  I connected with Barnie Lunch on 09/06/24 at 11:00 AM EST by telephone visit and verified that I am speaking with the correct person using two identifiers.   I discussed the limitations, risks, security and privacy concerns of performing an evaluation and management service by telemedicine and the availability of in-person appointments. I also discussed with the patient that there may be a patient responsible charge related to this service. The patient expressed understanding and agreed to proceed.   Other persons participating in the visit and their role in the encounter: NP, Patient    Patients location: Home   Providers location: Home   Assessment & Plan Normocytic anemia -- Will order full nutritional studies to include iron panel, ferritin, vitamin B12, folate, and methylmalonic acid. -- Will order hemolysis labs with LDH and reticulocytes.  If evidence of hemolysis we will also order haptoglobin and DAT -- Rule out multiple myeloma with SPEP and serum free light chains --She is on Eliquis  for atrial fibrillation and has frequent falls. --Had a recent colonoscopy and upper endoscopy on 07/25/2023 which showed a few polyps, diverticulosis and hemorrhoids.  EGD showed gastritis and white lesions that were sent for pathology.  Pathology was negative for high-grade dysplasia. -- If no clear etiology can be found we will need to pursue a bone marrow biopsy. -- Return to clinic pending the results of the above studies.  Frequent falls With that [facility] rehab but she is now back home. Had many falls over the past 3 to 4 months.  Vitamin D  deficiency Reports previously low vitamin D  levels.  She is currently not taking vitamin D  supplements she is only taking a One-A-Day vitamin. Chronic UTI Reports she keeps a urinary tract infection and she is currently on ciprofloxacin twice daily and has 2  additional days. She is followed closely by urology.  Orders Placed This Encounter  Procedures   Vitamin D  25 hydroxy    Standing Status:   Future    Expected Date:   09/06/2024    Expiration Date:   09/06/2025    INTERVAL HISTORY: Patient is a 69 year old female who was recently referred to hematology for chronic anemia.  On chart review, it appears her hemoglobin has been low since March 2025.  Her last normal hemoglobin was on 11/05/2023.  Since August her hemoglobin has been between 7 and 9.  Patient has had multiple falls and is on Eliquis .  Denies any obvious bright red blood per rectum, melena or hematochezia.  She has past medical history significant for anxiety, depression, type 2 diabetes, atrial fibrillation, dizziness, GERD, hyperlipidemia, hypertension, UTI, vertigo, B12 deficiency, vitamin D  deficiency and frequent yeast infections.  She has past surgical history that includes hysterectomy (2006), lumbar fusion (2006) and cervical spine surgery.  She had her gallbladder removed and back surgery as well.  She is a former smoker and quit smoking about 17 years ago.  Patient was seen at [facility] ED for a fall injuring her lower back in October 2025. She was evaluated at [facility] several times in early October for recurrent Klebsiella UTI and falls due to generalized weakness.  She was discharged to SNF [facility] rehab).  She required 7 days of IV Invanz .  She was also found to have hydronephrosis bilaterally and is followed by urology.  Had Foley catheter placed.  She is on Eliquis  for atrial fibrillation.  She has  uncontrolled diabetes with A1c 8.9.  Previously on Mounjaro  which was discontinued secondary to GERD symptoms.  Reports she last over 140 pounds in 2 years while taking Mounjaro .  Reports that she did not want to eat anything.  Feels like she may have nutritional issues because of this.  She is now eating better.  She does take a multivitamin daily.  Reports a family history of breast  cancer (mom) and colon cancer (dad).  SUMMARY OF HEMATOLOGIC HISTORY: Oncology History   No problem history exists.     CBC    Component Value Date/Time   WBC 7.6 06/07/2024 1003   RBC 2.82 (L) 06/07/2024 1003   HGB 8.8 (L) 06/08/2024 0927   HGB 13.7 06/01/2023 1612   HCT 29.2 (L) 06/08/2024 0927   HCT 43.6 06/01/2023 1612   PLT 314 06/07/2024 1003   PLT 193 06/01/2023 1612   MCV 82.3 06/07/2024 1003   MCV 97 06/01/2023 1612   MCH 24.8 (L) 06/07/2024 1003   MCHC 30.2 06/07/2024 1003   RDW 19.6 (H) 06/07/2024 1003   RDW 11.9 06/01/2023 1612   LYMPHSABS 1.9 06/07/2024 1003   LYMPHSABS 2.9 06/01/2023 1612   MONOABS 0.7 06/07/2024 1003   EOSABS 0.3 06/07/2024 1003   EOSABS 0.3 06/01/2023 1612   BASOSABS 0.0 06/07/2024 1003   BASOSABS 0.1 06/01/2023 1612       Latest Ref Rng & Units 06/07/2024   10:03 AM 06/06/2024    4:56 AM 06/05/2024    2:55 PM  CMP  Glucose 70 - 99 mg/dL 704  716  543   BUN 8 - 23 mg/dL 12  11  13    Creatinine 0.44 - 1.00 mg/dL 8.94  8.89  8.78   Sodium 135 - 145 mmol/L 134  133  130   Potassium 3.5 - 5.1 mmol/L 4.2  4.3  4.3   Chloride 98 - 111 mmol/L 100  98  95   CO2 22 - 32 mmol/L 25  28  26    Calcium  8.9 - 10.3 mg/dL 8.7  9.0  9.4   Total Protein 6.5 - 8.1 g/dL   6.5   Total Bilirubin 0.0 - 1.2 mg/dL   0.3   Alkaline Phos 38 - 126 U/L   151   AST 15 - 41 U/L   <10   ALT 0 - 44 U/L   <5      No results found for: FERRITIN, VITAMINB12  There were no vitals filed for this visit.  Review of System:  Review of Systems  Constitutional:  Positive for malaise/fatigue.  Musculoskeletal:  Positive for falls.    Physical Exam: Physical Exam Neurological:     Mental Status: She is alert and oriented to person, place, and time.      I spent 40 minutes dedicated to the care of this patient (face-to-face and non-face-to-face) on the date of the encounter to include what is described in the assessment and plan.,  Delon Hope,  NP 09/06/2024 11:13 AM "

## 2024-09-06 NOTE — Assessment & Plan Note (Addendum)
 With that [facility] rehab but she is now back home. Had many falls over the past 3 to 4 months.

## 2024-09-07 ENCOUNTER — Inpatient Hospital Stay

## 2024-09-07 DIAGNOSIS — S20229A Contusion of unspecified back wall of thorax, initial encounter: Secondary | ICD-10-CM | POA: Insufficient documentation

## 2024-09-07 NOTE — Assessment & Plan Note (Signed)
 Imaging reviewed with no acute findings. Continue home physical therapy as planned to improve strength, mobility, and balance. Encourage activity as tolerated with fall precautions. Provide a short-term refill of Percocet to manage pain until follow-up with pain management. Reinforce appropriate use, avoidance of overuse, and monitoring for side effects such as sedation or constipation. Encourage transition to non-opioid pain management strategies as able.

## 2024-09-11 ENCOUNTER — Telehealth: Payer: Self-pay | Admitting: Family Medicine

## 2024-09-11 NOTE — Telephone Encounter (Signed)
 Copied from CRM #[id number]. Topic: Medical Record Request - Other >> Sep 11, 2024  4:00 PM Antony RAMAN wrote: Reason for CRM: sam with [facility] is requesting chart notes from the date DEC 15, urgently Fax- [phone] [phone]

## 2024-09-11 NOTE — Telephone Encounter (Signed)
 faxed

## 2024-09-12 ENCOUNTER — Telehealth: Payer: Self-pay

## 2024-09-12 ENCOUNTER — Inpatient Hospital Stay

## 2024-09-12 ENCOUNTER — Telehealth: Payer: Self-pay | Admitting: Family Medicine

## 2024-09-12 NOTE — Telephone Encounter (Signed)
 Copied from CRM [id number]. Topic: Clinical - Home Health Verbal Orders >> Sep 11, 2024  4:28 PM Treva T wrote: Caller/Agency: [facility] Callback Number: [phone] Service Requested: Physical Therapy  Calling to advise provider unable to schedule appt for PT evaluation as unable to contact pt to begin therapy.   Initial visit was scheduled for Monday, states [t daughter called and stated Monday was not a good day, rescheduled to another, and now she is not getting a response from pt after multiple attempts.   Can contact at above number to further advise.

## 2024-09-12 NOTE — Telephone Encounter (Unsigned)
 Copied from CRM #[id number]. Topic: Medical Record Request - Other >> Sep 12, 2024  1:09 PM Amy Livingston wrote: Reason for CRM: Sam with [facility] needs office from office visits from Aug through Dec of 2025-  fax [phone]

## 2024-09-12 NOTE — Telephone Encounter (Signed)
 faxed

## 2024-09-21 ENCOUNTER — Inpatient Hospital Stay

## 2024-09-24 ENCOUNTER — Ambulatory Visit: Admitting: Gastroenterology

## 2024-09-26 ENCOUNTER — Ambulatory Visit: Admitting: Urology

## 2024-09-27 ENCOUNTER — Inpatient Hospital Stay: Admitting: Oncology

## 2024-10-02 ENCOUNTER — Ambulatory Visit: Payer: Medicare Other

## 2024-11-07 ENCOUNTER — Ambulatory Visit: Admitting: Urology
